# Patient Record
Sex: Female | Born: 1935 | Race: White | Hispanic: No | Smoking: Never smoker
Health system: Southern US, Community
[De-identification: ages and names within clinical notes are randomized; demographics above are authoritative.]

## PROBLEM LIST (undated history)

## (undated) DIAGNOSIS — Z9289 Personal history of other medical treatment: Secondary | ICD-10-CM

## (undated) DIAGNOSIS — M810 Age-related osteoporosis without current pathological fracture: Secondary | ICD-10-CM

## (undated) DIAGNOSIS — M8580 Other specified disorders of bone density and structure, unspecified site: Secondary | ICD-10-CM

## (undated) DIAGNOSIS — I2510 Atherosclerotic heart disease of native coronary artery without angina pectoris: Secondary | ICD-10-CM

## (undated) DIAGNOSIS — E785 Hyperlipidemia, unspecified: Secondary | ICD-10-CM

## (undated) DIAGNOSIS — I10 Essential (primary) hypertension: Secondary | ICD-10-CM

## (undated) DIAGNOSIS — N189 Chronic kidney disease, unspecified: Secondary | ICD-10-CM

## (undated) HISTORY — DX: Other specified disorders of bone density and structure, unspecified site: M85.80

## (undated) HISTORY — DX: Chronic kidney disease, unspecified: N18.9

## (undated) HISTORY — DX: Hyperlipidemia, unspecified: E78.5

## (undated) HISTORY — DX: Age-related osteoporosis without current pathological fracture: M81.0

## (undated) HISTORY — DX: Essential (primary) hypertension: I10

## (undated) HISTORY — DX: Personal history of other medical treatment: Z92.89

## (undated) HISTORY — DX: Atherosclerotic heart disease of native coronary artery without angina pectoris: I25.10

## (undated) HISTORY — PX: LIPOMA EXCISION: SHX5283

---

## 2004-09-30 DIAGNOSIS — I219 Acute myocardial infarction, unspecified: Secondary | ICD-10-CM

## 2004-09-30 HISTORY — DX: Acute myocardial infarction, unspecified: I21.9

## 2005-03-28 ENCOUNTER — Inpatient Hospital Stay (HOSPITAL_COMMUNITY): Admission: EM | Admit: 2005-03-28 | Discharge: 2005-04-01 | Payer: Self-pay | Admitting: Emergency Medicine

## 2005-05-09 ENCOUNTER — Encounter (HOSPITAL_COMMUNITY): Admission: RE | Admit: 2005-05-09 | Discharge: 2005-08-07 | Payer: Self-pay | Admitting: Interventional Cardiology

## 2005-08-08 ENCOUNTER — Encounter (HOSPITAL_COMMUNITY): Admission: RE | Admit: 2005-08-08 | Discharge: 2005-08-24 | Payer: Self-pay | Admitting: Interventional Cardiology

## 2013-02-19 ENCOUNTER — Other Ambulatory Visit: Payer: Self-pay | Admitting: Family Medicine

## 2013-02-19 DIAGNOSIS — R945 Abnormal results of liver function studies: Secondary | ICD-10-CM

## 2013-03-01 ENCOUNTER — Ambulatory Visit
Admission: RE | Admit: 2013-03-01 | Discharge: 2013-03-01 | Disposition: A | Discharge status: Home / Self Care | Payer: Medicare Other | Source: Ambulatory Visit | Attending: Family Medicine | Admitting: Family Medicine

## 2013-03-01 DIAGNOSIS — R945 Abnormal results of liver function studies: Secondary | ICD-10-CM

## 2014-04-27 ENCOUNTER — Encounter: Payer: Self-pay | Admitting: *Deleted

## 2015-12-26 ENCOUNTER — Other Ambulatory Visit (HOSPITAL_COMMUNITY): Payer: Self-pay | Admitting: Family Medicine

## 2015-12-26 DIAGNOSIS — R0609 Other forms of dyspnea: Principal | ICD-10-CM

## 2016-01-04 ENCOUNTER — Ambulatory Visit (HOSPITAL_COMMUNITY): Payer: Medicare Other | Attending: Cardiology

## 2016-01-04 ENCOUNTER — Other Ambulatory Visit: Payer: Self-pay

## 2016-01-04 DIAGNOSIS — I119 Hypertensive heart disease without heart failure: Secondary | ICD-10-CM | POA: Diagnosis not present

## 2016-01-04 DIAGNOSIS — R0609 Other forms of dyspnea: Secondary | ICD-10-CM

## 2016-01-04 DIAGNOSIS — I340 Nonrheumatic mitral (valve) insufficiency: Secondary | ICD-10-CM | POA: Diagnosis not present

## 2016-01-04 DIAGNOSIS — I358 Other nonrheumatic aortic valve disorders: Secondary | ICD-10-CM | POA: Insufficient documentation

## 2016-01-04 DIAGNOSIS — E785 Hyperlipidemia, unspecified: Secondary | ICD-10-CM | POA: Diagnosis not present

## 2016-01-04 DIAGNOSIS — I252 Old myocardial infarction: Secondary | ICD-10-CM | POA: Diagnosis not present

## 2016-02-02 ENCOUNTER — Ambulatory Visit (INDEPENDENT_AMBULATORY_CARE_PROVIDER_SITE_OTHER): Payer: Medicare Other | Admitting: Internal Medicine

## 2016-02-02 ENCOUNTER — Encounter: Payer: Self-pay | Admitting: Internal Medicine

## 2016-02-02 VITALS — BP 160/80 | HR 70 | Ht 64.0 in | Wt 130.8 lb

## 2016-02-02 DIAGNOSIS — R5383 Other fatigue: Secondary | ICD-10-CM | POA: Diagnosis not present

## 2016-02-02 DIAGNOSIS — R0600 Dyspnea, unspecified: Secondary | ICD-10-CM

## 2016-02-02 MED ORDER — AMLODIPINE BESYLATE 2.5 MG PO TABS: 2.5000 mg | ORAL_TABLET | Freq: Every day | ORAL | Status: DC

## 2016-02-02 NOTE — Patient Instructions (Signed)
Your physician has recommended you make the following change in your medication:  1. ) START AMLODIPINE 2.5 MG ONCE A DAY FOR BLOOD PRESSURE Please call in a few weeks to let us know how you are feeling; how your blood pressure is  Your physician recommends that you return for lab work in: today (BMET, CBC, TSH)

## 2016-02-02 NOTE — Progress Notes (Signed)
Cardiology Office Note   Date:  02/02/2016   ID:  Heather Saunders, DOB 06/16/1925, MRN [id number]  PCP:  Tawanna Solo, MD  Cardiologist:   Dorris Carnes, MD   Pt referred by Dr Sabra Heck for dyspnea      History of Present Illness: Heather Saunders is a 82 y.o. female with a history of Dyspnea.  Was followed in past  by Dr Tamala Julian  Hx of NSTEMI in 2006  Cath showed nonobstructive CAD  She also has a  Hx of diastolic dysfunction.and HT   Pt has had dyspnea for the past few years.  Cannot walk as far as she used to  Rare tightness  Goes away    PT gets winded with walking  One year ago walked around [place] problem   Now has to stop twice to catch breath        Outpatient Prescriptions Prior to Visit  Medication Sig Dispense Refill  . aspirin 81 MG tablet Take 81 mg by mouth daily.    Marland Kitchen atorvastatin (LIPITOR) 40 MG tablet Take 40 mg by mouth daily.    . Calcium Carbonate-Vitamin D (CALCIUM-D) 600-400 MG-UNIT TABS Take by mouth.    . cholecalciferol (VITAMIN D) 1000 UNITS tablet Take 1,000 Units by mouth daily.    Mariane Baumgarten Calcium (STOOL SOFTENER PO) Take by mouth as needed.    . metoprolol succinate (TOPROL-XL) 50 MG 24 hr tablet Take 50 mg by mouth 2 (two) times daily. Take with or immediately following a meal.    . Multiple Vitamin (MULTIVITAMIN) capsule Take 1 capsule by mouth daily.     No facility-administered medications prior to visit.     Allergies:   Boniva and Fosamax   Past Medical History  Diagnosis Date  . Hypertension   . Hyperlipidemia   . Coronary disease     NonObstructive  . Osteoporosis   . Osteopenia   . Chronic kidney disease     Past Surgical History  Procedure Laterality Date  . Lipoma excision      removed from Shoulder     Social History:  The patient  reports that she has never smoked. She does not have any smokeless tobacco history on file. She reports that she drinks about 1.2 oz of alcohol per week.  She reports that she does not use illicit drugs.   Family History:  The patient's family history includes Cancer (age of onset: 83) in her mother.    ROS:  Please see the history of present illness. All other systems are reviewed and  Negative to the above problem except as noted.    PHYSICAL EXAM: VS:  BP 160/80 mmHg  Pulse 70  Ht 5\' 4"  (1.626 m)  Wt 130 lb 12.8 oz (59.33 kg)  BMI 22.44 kg/m2  SpO2 94%  GEN: Well nourished, well developed, in no acute distress HEENT: normal Neck: no JVD, carotid bruits, or masses Cardiac: RRR; no murmurs, rubs, or gallops,no edema  Respiratory:  clear to auscultation bilaterally, normal work of breathing GI: soft, nontender, nondistended, + BS  No hepatomegaly  MS: no deformity Moving all extremities   Skin: warm and dry, no rash Neuro:  Strength and sensation are intact Psych: euthymic mood, full affect   EKG:  EKG is ordered today.  SR 66 bpm  First degree AV block  PR 206 msec     Lipid Panel No results found for: CHOL, TRIG, HDL, CHOLHDL, VLDL, LDLCALC, LDLDIRECT  Wt Readings from Last 3 Encounters:  02/02/16 130 lb 12.8 oz (59.33 kg)      ASSESSMENT AND PLAN:  1  Dyspnea  Keep on current meds  BP is up a little  May explain some of symptoms  Will plan to check CBC and TSH Add 2.5 mg amlodipine Pt will call back in 2 wks regarding her symptoms/response  If still SOB would sched Lexiscan myovue  2.  HTN  As noted above  3.  HL  Keep on statin     F?U based on test results    Signed, Dorris Carnes, MD  02/02/2016 1:49 PM    [facility] [place], [place]  [address] Phone: [phone]; Fax: [phone]

## 2016-02-03 LAB — CBC
HEMATOCRIT: 40.5 % (ref 35.0–45.0)
Hemoglobin: 13.1 g/dL (ref 11.7–15.5)
MCH: 29.2 pg (ref 27.0–33.0)
MCHC: 32.3 g/dL (ref 32.0–36.0)
MCV: 90.2 fL (ref 80.0–100.0)
MPV: 9.9 fL (ref 7.5–12.5)
PLATELETS: 242 10*3/uL (ref 140–400)
RBC: 4.49 MIL/uL (ref 3.80–5.10)
RDW: 14 % (ref 11.0–15.0)
WBC: 10.2 10*3/uL (ref 3.8–10.8)

## 2016-02-03 LAB — BASIC METABOLIC PANEL
BUN: 35 mg/dL — AB (ref 7–25)
CO2: 26 mmol/L (ref 20–31)
CREATININE: 1.53 mg/dL — AB (ref 0.60–0.88)
Calcium: 10 mg/dL (ref 8.6–10.4)
Chloride: 101 mmol/L (ref 98–110)
Glucose, Bld: 99 mg/dL (ref 65–99)
Potassium: 4.5 mmol/L (ref 3.5–5.3)
Sodium: 139 mmol/L (ref 135–146)

## 2016-02-03 LAB — TSH: TSH: 3.26 m[IU]/L

## 2016-02-07 ENCOUNTER — Encounter: Payer: Self-pay | Admitting: Interventional Cardiology

## 2016-02-16 ENCOUNTER — Telehealth: Payer: Self-pay | Admitting: Internal Medicine

## 2016-02-16 NOTE — Telephone Encounter (Signed)
Pt states she is calling to report BP readings, pt does not know her heart rate. Pt states she started amlodipine 2.5mg  02/04/16. Pt states she has had a couple episodes of short lasting lightheadedness, but denies other symptoms. Pt states she has had a tooth ache and is going to the dentist next week.  Pt advised I will forward to Dr Harrington Challenger for review.

## 2016-02-16 NOTE — Telephone Encounter (Signed)
Left msg for pt on VM BP is still high  Would try increasing amlodipine to 2.5 mg 2x per day Follow BP and symptms   Asked her to call back in 2 wks with pressures and how breathing is

## 2016-02-16 NOTE — Telephone Encounter (Signed)
Fast busy #.

## 2016-02-16 NOTE — Telephone Encounter (Signed)
New message      Pt c/o BP issue: STAT if pt c/o blurred vision, one-sided weakness or slurred speech  1. What are your last 5 BP readings? 5-9 149/70, 5-10 137/65, 5-11 144/68, 5-12 124/62, 5-13 166/75, 5-14 163/79, 5-15 168/84, 5-17 173/79, 5-18 168/80 2. Are you having any other symptoms (ex. Dizziness, headache, blurred vision, passed out)? no 3. What is your BP issue?  Calling to give updated bp readings since last ov and medication change and, pt want to know if she had an infection in her body, would that make her bp go up?

## 2016-02-19 MED ORDER — AMLODIPINE BESYLATE 2.5 MG PO TABS: 2.5000 mg | ORAL_TABLET | Freq: Two times a day (BID) | ORAL | Status: DC

## 2017-03-24 ENCOUNTER — Other Ambulatory Visit: Payer: Self-pay | Admitting: Internal Medicine

## 2017-06-25 ENCOUNTER — Telehealth: Payer: Self-pay | Admitting: Internal Medicine

## 2017-06-25 NOTE — Telephone Encounter (Signed)
Seen as new patient May, 2017 for SOB, BP was elevated.     Will send message to scheduling to get patient appointment.   Routing to Dr. Harrington Challenger to make aware.

## 2017-06-25 NOTE — Telephone Encounter (Signed)
New message   Manuela Schwartz verbalized that In 2017 Dr.Ross seen pt and she had a note that states if Dyspnea continued pt needs a stress test    it has reoccurred and she wants to know if pt will need to see Dr.Ross or if just a Stress Test needs to be ordered   if [place] office needs to write the order for stress test or to be forwarded back to Dr.Ross

## 2017-06-26 ENCOUNTER — Other Ambulatory Visit: Payer: Self-pay | Admitting: Internal Medicine

## 2017-06-26 NOTE — Telephone Encounter (Signed)
Please book pt in to see me or one of APPs for evaluation

## 2017-06-27 ENCOUNTER — Telehealth: Payer: Self-pay | Admitting: Internal Medicine

## 2017-06-27 NOTE — Telephone Encounter (Signed)
Patient has appointment with Richardson Dopp, PA-C on 06/30/17.

## 2017-06-27 NOTE — Telephone Encounter (Signed)
New message   Pt verbalized that she wants to speak to rn about the appt and why was it scheduled

## 2017-06-27 NOTE — Telephone Encounter (Signed)
Error

## 2017-06-27 NOTE — Telephone Encounter (Signed)
Explained to patient that Dr. Sabra Heck referred her back to cardiology for shortness of breath evaluation.  Pt is agreeable to appointment but needed to change to 07/02/17.   She remains SOB at times, can't walk as far without having to rest.

## 2017-06-30 ENCOUNTER — Ambulatory Visit: Payer: Medicare Other | Admitting: Physician Assistant

## 2017-07-02 ENCOUNTER — Encounter: Payer: Self-pay | Admitting: Physician Assistant

## 2017-07-02 ENCOUNTER — Ambulatory Visit (INDEPENDENT_AMBULATORY_CARE_PROVIDER_SITE_OTHER): Payer: Medicare Other | Admitting: Physician Assistant

## 2017-07-02 VITALS — BP 130/70 | HR 62 | Ht 64.0 in | Wt 133.4 lb

## 2017-07-02 DIAGNOSIS — R0602 Shortness of breath: Secondary | ICD-10-CM | POA: Diagnosis not present

## 2017-07-02 DIAGNOSIS — I10 Essential (primary) hypertension: Secondary | ICD-10-CM | POA: Diagnosis not present

## 2017-07-02 DIAGNOSIS — I2510 Atherosclerotic heart disease of native coronary artery without angina pectoris: Secondary | ICD-10-CM

## 2017-07-02 NOTE — Patient Instructions (Signed)
Medication Instructions:  1. Your physician recommends that you continue on your current medications as directed. Please refer to the Current Medication list given to you today.   Labwork: TODAY BMET, PRO BNP  Testing/Procedures: 1. Your physician has requested that you have en exercise stress myoview. For further information please visit [email or web address]. Please follow instruction sheet, as given.    Follow-Up: DR. Harrington Challenger IN 1 MONTH  Any Other Special Instructions Will Be Listed Below (If Applicable).     If you need a refill on your cardiac medications before your next appointment, please call your pharmacy.

## 2017-07-02 NOTE — Progress Notes (Signed)
Cardiology Office Note:    Date:  07/02/2017   ID:  Heather Saunders, DOB Oct 01, 1924, MRN [id number]  PCP:  Heather Lass, MD  Cardiologist:  Heather Saunders    Referring MD: Heather Lass, MD   Chief Complaint  Patient presents with  . Shortness of Breath    History of Present Illness:    Heather Saunders is a 83 y.o. female with a hx of CAD status post non-STEMI in 7628, HTN, diastolic dysfunction. Records indicate that she had a cardiac catheterization in 2006 at the time of her non-STEMI that demonstrated nonobstructive CAD. I cannot locate the cath report. She was evaluated by Dr. Harrington Saunders in 5/17 for shortness of breath. Medications were adjusted for blood pressure.  Ms. Heather Saunders returns for evaluation of shortness of breath. She has continued to have symptoms of shortness of breath with moderate activities. She still does ballroom dancing. However, she has to stop after one dance to rest. She has a nonproductive cough. 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 She denies substernal chest discomfort. However, she has an occasional twinge in her chest that does not seem to be related to exertion. She denies orthopnea, PND or edema. She denies syncope.  Prior CV studies:   The following studies were reviewed today:  Echocardiogram 01/04/16 Moderate focal basal septal hypertrophy, EF 60-65, normal wall motion, grade 1 diastolic dysfunction, calcified aortic valve, mild MR  Past Medical History:  Diagnosis Date  . Chronic kidney disease   . Coronary disease    NonObstructive  . Hyperlipidemia   . Hypertension   . Osteopenia   . Osteoporosis     Past Surgical History:  Procedure Laterality Date  . LIPOMA EXCISION     removed from Shoulder    Current Medications: Current Meds  Medication Sig  . amLODipine (NORVASC) 2.5 MG tablet TAKE 1 TABLET (2.5 MG TOTAL) BY MOUTH 2 (TWO) TIMES DAILY.  Marland Kitchen aspirin 81 MG tablet Take 81 mg by  mouth daily.  Marland Kitchen atorvastatin (LIPITOR) 40 MG tablet Take 40 mg by mouth daily.  Heather Saunders Calcium (STOOL SOFTENER PO) Take by mouth as needed.  Marland Kitchen lisinopril-hydrochlorothiazide (PRINZIDE,ZESTORETIC) 20-12.5 MG tablet Take 1 tablet by mouth 2 (two) times daily.  . metoprolol succinate (TOPROL-XL) 50 MG 24 hr tablet Take 50 mg by mouth 2 (two) times daily. Take with or immediately following a meal.  . Multiple Vitamin (MULTIVITAMIN) capsule Take 1 capsule by mouth daily.     Allergies:   Boniva [ibandronic acid] and Fosamax [alendronate sodium]   Social History   Social History  . Marital status: Divorced    Spouse name: N/A  . Number of children: N/A  . Years of education: N/A   Social History Main Topics  . Smoking status: Never Smoker  . Smokeless tobacco: Never Used  . Alcohol use 1.2 oz/week    2 Glasses of wine per week  . Drug use: No  . Sexual activity: Not Asked   Other Topics Concern  . None   Social History Narrative  . None     Family Hx: The patient's family history includes Cancer (age of onset: 89) in her mother.  ROS:   Please see the history of present illness.    Review of Systems  Cardiovascular: Positive for dyspnea on exertion.  Musculoskeletal: Positive for back pain.   All other systems reviewed and are negative.   EKGs/Labs/Other Test Reviewed:  EKG:  EKG is  ordered today.  The ekg ordered today demonstrates NSR, HR 62, normal axis, QTC 430 ms, first-degree AV block  Recent Labs: No results found for requested labs within last 8760 hours.   Recent Lipid Panel No results found for: CHOL, TRIG, HDL, CHOLHDL, LDLCALC, LDLDIRECT  Physical Exam:    VS:  BP 130/70   Pulse 62   Ht 5\' 4"  (1.626 m)   Wt 133 lb 6.4 oz (60.5 kg)   SpO2 97%   BMI 22.90 kg/m     Wt Readings from Last 3 Encounters:  07/02/17 133 lb 6.4 oz (60.5 kg)  02/02/16 130 lb 12.8 oz (59.3 kg)     Physical Exam  Constitutional: She is oriented to person, place,  and time. She appears well-developed and well-nourished. No distress.  HENT:  Head: Normocephalic and atraumatic.  Eyes: No scleral icterus.  Neck: No JVD present.  Cardiovascular: Normal rate and regular rhythm.   No murmur heard. Pulmonary/Chest: Effort normal. She has no rales.  Abdominal: Soft. There is no tenderness.  Musculoskeletal: She exhibits no edema.  Neurological: She is alert and oriented to person, place, and time.  Skin: Skin is warm and dry.  Psychiatric: She has a normal mood and affect.    ASSESSMENT:    1. Shortness of breath   2. Essential hypertension   3. Coronary artery disease involving native coronary artery of native heart without angina pectoris    PLAN:    In order of problems listed above:  1. Shortness of breath Etiology not clear.  She has some atypical chest pain.  Her ECG is unchanged.  Echo last year demonstrated normal ejection fraction and mild diastolic dysfunction.  She does not appear volume overloaded.  Since she has a hx of diastolic dysfunction, I will check a BMET, BNP.  Consider increasing the HCTZ if her BNP is high.  I will also arrange Nuclear stress test to rule out ischemia.   2. Essential hypertension The patient's blood pressure is controlled on her current regimen.  Continue current therapy.    3. CAD Remote hx of NSTEMI and non-obstructive CAD on Cardiac Catheterization.  Continue ASA, statin.  Proceed with Nuclear stress test to rule out dyspnea as an anginal equivalent.    Dispo:  Return in about 4 weeks (around 07/30/2017) for Follow up after testing, w/ Dr. Harrington Saunders, or Heather Dopp, PA-C.   Medication Adjustments/Labs and Tests Ordered: Current medicines are reviewed at length with the patient today.  Concerns regarding medicines are outlined above.  Tests Ordered: Orders Placed This Encounter  Procedures  . Pro b natriuretic peptide  . Basic metabolic panel  . Myocardial Perfusion Imaging  . EKG 12-Lead    Medication Changes: No orders of the defined types were placed in this encounter.   Signed, Heather Dopp, PA-C  07/02/2017 5:15 PM    [facility] [place] [place], [place], [place]  [address] Phone: [phone]; Fax: [phone]

## 2017-07-03 ENCOUNTER — Telehealth: Payer: Self-pay | Admitting: *Deleted

## 2017-07-03 LAB — BASIC METABOLIC PANEL
BUN / CREAT RATIO: 20 (ref 12–28)
BUN: 27 mg/dL (ref 10–36)
CHLORIDE: 100 mmol/L (ref 96–106)
CO2: 24 mmol/L (ref 20–29)
Calcium: 10.3 mg/dL (ref 8.7–10.3)
Creatinine, Ser: 1.36 mg/dL — ABNORMAL HIGH (ref 0.57–1.00)
GFR calc Af Amer: 40 mL/min/{1.73_m2} — ABNORMAL LOW (ref >59)
GFR calc non Af Amer: 34 mL/min/{1.73_m2} — ABNORMAL LOW (ref >59)
GLUCOSE: 104 mg/dL — AB (ref 65–99)
POTASSIUM: 4.7 mmol/L (ref 3.5–5.2)
Sodium: 139 mmol/L (ref 134–144)

## 2017-07-03 LAB — PRO B NATRIURETIC PEPTIDE: NT-Pro BNP: 514 pg/mL (ref 0–738)

## 2017-07-03 NOTE — Telephone Encounter (Signed)
Pt has been notified of lab results by phone with verbal understanding. Pt thanked me for my call.  

## 2017-07-03 NOTE — Telephone Encounter (Signed)
Lmtcb to go over lab results 

## 2017-07-03 NOTE — Telephone Encounter (Signed)
-----   Message from Liliane Shi, [place] sent at 07/03/2017  8:33 AM EDT ----- Please call the patient Kidney function is stable.  BNP is normal. Continue with current treatment plan. Richardson Dopp, PA-C   07/03/2017 8:33 AM

## 2017-07-03 NOTE — Telephone Encounter (Signed)
Follow Up ° ° ° °Returning call from earlier. Please call. °

## 2017-07-08 ENCOUNTER — Telehealth (HOSPITAL_COMMUNITY): Payer: Self-pay | Admitting: *Deleted

## 2017-07-08 NOTE — Telephone Encounter (Signed)
Left message on voicemail per DPR in reference to upcoming appointment scheduled on 07/11/17 at 0915 with detailed instructions given per Myocardial Perfusion Study Information Sheet for the test. LM to arrive 15 minutes early, and that it is imperative to arrive on time for appointment to keep from having the test rescheduled. If you need to cancel or reschedule your appointment, please call the office within 24 hours of your appointment. Failure to do so may result in a cancellation of your appointment, and a $50 no show fee. Phone number given for call back for any questions.

## 2017-07-11 ENCOUNTER — Ambulatory Visit (HOSPITAL_COMMUNITY): Payer: Medicare Other | Attending: Internal Medicine

## 2017-07-11 DIAGNOSIS — R0602 Shortness of breath: Secondary | ICD-10-CM | POA: Insufficient documentation

## 2017-07-11 LAB — MYOCARDIAL PERFUSION IMAGING
CHL CUP MPHR: 130 {beats}/min
CHL CUP NUCLEAR SDS: 10
CSEPHR: 90 %
CSEPPHR: 117 {beats}/min
Estimated workload: 4.6 METS
Exercise duration (min): 6 min
Exercise duration (sec): 2 s
LHR: 0.24
LVDIAVOL: 42 mL (ref 46–106)
LVSYSVOL: 2 mL
NUC STRESS TID: 0.84
RPE: 17
Rest HR: 71 {beats}/min
SRS: 13
SSS: 23

## 2017-07-11 MED ORDER — TECHNETIUM TC 99M TETROFOSMIN IV KIT
31.8000 | PACK | Freq: Once | INTRAVENOUS | Status: AC | PRN
  Administered 2017-07-11: 31.8 via INTRAVENOUS
  Filled 2017-07-11: qty 32

## 2017-07-11 MED ORDER — TECHNETIUM TC 99M TETROFOSMIN IV KIT
10.2000 | PACK | Freq: Once | INTRAVENOUS | Status: AC | PRN
  Administered 2017-07-11: 10.2 via INTRAVENOUS
  Filled 2017-07-11: qty 11

## 2017-07-12 ENCOUNTER — Encounter: Payer: Self-pay | Admitting: Physician Assistant

## 2017-07-14 ENCOUNTER — Telehealth: Payer: Self-pay | Admitting: *Deleted

## 2017-07-14 NOTE — Telephone Encounter (Signed)
Pt has been notified of Myoview results by phone with verbal understanding. Pt thanked me for my call today.

## 2017-07-14 NOTE — Telephone Encounter (Signed)
-----   Message from Liliane Shi, [place] sent at 07/12/2017  3:08 PM EDT ----- Please call the patient. Her stress test looks ok.  There is normal blood flow.  Continue current treatment plan.  Keep follow up as planned.   I will fwd to Dr. Dorris Carnes for her review as well.  Please fax a copy of this study result to her PCP:  Kathyrn Lass, MD  Thanks! Richardson Dopp, PA-C    07/12/2017 3:02 PM

## 2017-07-23 ENCOUNTER — Other Ambulatory Visit: Payer: Self-pay

## 2017-07-23 MED ORDER — AMLODIPINE BESYLATE 2.5 MG PO TABS: 2.5000 mg | ORAL_TABLET | Freq: Two times a day (BID) | ORAL | 3 refills | Status: DC

## 2017-08-01 ENCOUNTER — Encounter: Payer: Self-pay | Admitting: Physician Assistant

## 2017-08-14 ENCOUNTER — Ambulatory Visit: Payer: Medicare Other | Admitting: Physician Assistant

## 2017-09-02 NOTE — Progress Notes (Signed)
Cardiology Office Note    Date:  09/03/2017   ID:  Heather Saunders, DOB July 15, 1925, MRN [id number]  PCP:  Heather Lass, MD  Cardiologist: Dr. Harrington Saunders  Chief Complaint: 2 Months follow up  History of Present Illness:   [name] is a 83 y.o. female with a hx of CAD status post non-STEMI in 9935, HTN, diastolic dysfunction presents for follow up.   Records indicate that she had a cardiac catheterization in 2006 at the time of her non-STEMI that demonstrated nonobstructive CAD. Unable to locate cath report.   Seen by Richardson Saunders 07/02/17 for DOE and atypical chest pain. EKG reassuring. Follow up stress test is low risk. Normal BNP.   Here today for follow up. She continues to have shortness of breath with activity. She can walker 1 to 1 1/2 block at a times. She gets rest and able to walk further. She goes to dancing class 2 times/week. She is independent at home. No orthopnea, PND, syncope, LE edema or melena.       Past Medical History:  Diagnosis Date  . Chronic kidney disease   . Coronary disease    NonObstructive  . History of nuclear stress test    Nuclear stress test 10/18: EF 96, normal perfusion, low risk  . Hyperlipidemia   . Hypertension   . Osteopenia   . Osteoporosis     Past Surgical History:  Procedure Laterality Date  . LIPOMA EXCISION     removed from Shoulder    Current Medications: Prior to Admission medications   Medication Sig Start Date End Date Taking? Authorizing Provider  amLODipine (NORVASC) 2.5 MG tablet Take 1 tablet (2.5 mg total) by mouth 2 (two) times daily. 07/23/17   Belva Crome, MD  aspirin 81 MG tablet Take 81 mg by mouth daily.    [provider]  atorvastatin (LIPITOR) 40 MG tablet Take 40 mg by mouth daily.    [provider]  Docusate Calcium (STOOL SOFTENER PO) Take by mouth as needed.    [provider]  lisinopril-hydrochlorothiazide (PRINZIDE,ZESTORETIC) 20-12.5 MG tablet Take 1 tablet by  mouth 2 (two) times daily. 01/03/16   [provider]  metoprolol succinate (TOPROL-XL) 50 MG 24 hr tablet Take 50 mg by mouth 2 (two) times daily. Take with or immediately following a meal.    [provider]  Multiple Vitamin (MULTIVITAMIN) capsule Take 1 capsule by mouth daily.    [provider]    Allergies:   Boniva [ibandronic acid] and Fosamax [alendronate sodium]   Social History   Socioeconomic History  . Marital status: Divorced    Spouse name: None  . Number of children: None  . Years of education: None  . Highest education level: None  Social Needs  . Financial resource strain: None  . Food insecurity - worry: None  . Food insecurity - inability: None  . Transportation needs - medical: None  . Transportation needs - non-medical: None  Occupational History  . None  Tobacco Use  . Smoking status: Never Smoker  . Smokeless tobacco: Never Used  Substance and Sexual Activity  . Alcohol use: Yes    Alcohol/week: 1.2 oz    Types: 2 Glasses of wine per week  . Drug use: No  . Sexual activity: None  Other Topics Concern  . None  Social History Narrative  . None     Family History:  The patient's family history includes Cancer (age of onset: 35) in  her mother.   ROS:   Please see the history of present illness.    ROS All other systems reviewed and are negative.   PHYSICAL EXAM:   VS:  BP (!) 166/72   Pulse 69   Ht 5\' 4"  (1.626 m)   Wt 134 lb (60.8 kg)   SpO2 97%   BMI 23.00 kg/m    GEN: Well nourished, well developed, in no acute distress  HEENT: normal  Neck: no JVD, carotid bruits, or masses Cardiac: RRR; no murmurs, rubs, or gallops,no edema  Respiratory:  clear to auscultation bilaterally, normal work of breathing GI: soft, nontender, nondistended, + BS MS: no deformity or atrophy  Skin: warm and dry, no rash Neuro:  Alert and Oriented x 3, Strength and sensation are intact Psych: euthymic mood, full affect  Wt Readings  from Last 3 Encounters:  09/03/17 134 lb (60.8 kg)  07/02/17 133 lb 6.4 oz (60.5 kg)  02/02/16 130 lb 12.8 oz (59.3 kg)      Studies/Labs Reviewed:   EKG:  EKG is not ordered today.    Recent Labs: 07/02/2017: BUN 27; Creatinine, Ser 1.36; NT-Pro BNP 514; Potassium 4.7; Sodium 139   Lipid Panel No results found for: CHOL, TRIG, HDL, CHOLHDL, VLDL, LDLCALC, LDLDIRECT  Additional studies/ records that were reviewed today include:   Echocardiogram: 12/2015 Study Conclusions  - Left ventricle: The cavity size was normal. There was moderate   focal basal hypertrophy. Systolic function was normal. The   estimated ejection fraction was in the range of 60% to 65%. Wall   motion was normal; there were no regional wall motion   abnormalities. There was an increased relative contribution of   atrial contraction to ventricular filling. Doppler parameters are   consistent with abnormal left ventricular relaxation (grade 1   diastolic dysfunction). Doppler parameters are consistent with   high ventricular filling pressure. - Aortic valve: Moderate diffuse thickening and calcification. - Mitral valve: There was mild regurgitation.   Myoview 07/11/17  Nuclear stress EF: 96%.  Blood pressure demonstrated a normal response to exercise.  There was no ST segment deviation noted during stress.  No T wave inversion was noted during stress.  The study is normal.  This is a low risk study.   Low risk stress nuclear study with normal perfusion and normal left ventricular regional and global systolic function. EF >70%, small heart size. Limited sensitivity for ischemia.   ASSESSMENT & PLAN:    1. CAD - Remote hx of NSTEMI and non-obstructive CAD on Cardiac Catheterization. Low risk stress test 06/2017.  Continue ASA, statin and BB.   2. Dyspnea on Exertion - Stable for past few months. No ischemia on stress test. No evidence of volume overload on exam and by symptoms. Echo last year  showed diastolic dysfunction. Recent BNP normal. Unclear etiology. Continue to observe.   3. HTN - Elevated. Up titrate amlodipine as above.   4. HLD - Continue statin   Medication Adjustments/Labs and Tests Ordered: Current medicines are reviewed at length with the patient today.  Concerns regarding medicines are outlined above.  Medication changes, Labs and Tests ordered today are listed in the Patient Instructions below. Patient Instructions  Medication Instructions: Your physician has recommended you make the following change in your medication:  -1) INCREASE Amlodipine 5 mg - Take 1 Tablet (5 mg ) by mouth twice daily  Labwork: None Ordered  Procedures/Testing: None  Follow-Up: Your physician wants you to follow-up in 4-6  WEEKS with Dr. Harrington Saunders   If you need a refill on your cardiac medications before your next appointment, please call your pharmacy.      Jarrett Soho, [place]  09/03/2017 8:56 AM    [facility] [address], [place], [place]  [address] Phone: [phone]; Fax: [phone]

## 2017-09-03 ENCOUNTER — Encounter: Payer: Self-pay | Admitting: Physician Assistant

## 2017-09-03 ENCOUNTER — Ambulatory Visit: Payer: Medicare Other | Admitting: Physician Assistant

## 2017-09-03 VITALS — BP 166/72 | HR 69 | Ht 64.0 in | Wt 134.0 lb

## 2017-09-03 DIAGNOSIS — E782 Mixed hyperlipidemia: Secondary | ICD-10-CM

## 2017-09-03 DIAGNOSIS — I2510 Atherosclerotic heart disease of native coronary artery without angina pectoris: Secondary | ICD-10-CM

## 2017-09-03 DIAGNOSIS — R0609 Other forms of dyspnea: Secondary | ICD-10-CM | POA: Diagnosis not present

## 2017-09-03 DIAGNOSIS — I10 Essential (primary) hypertension: Secondary | ICD-10-CM | POA: Diagnosis not present

## 2017-09-03 MED ORDER — LISINOPRIL-HYDROCHLOROTHIAZIDE 20-25 MG PO TABS: 1.0000 | ORAL_TABLET | Freq: Every day | ORAL | 1 refills | Status: DC

## 2017-09-03 MED ORDER — AMLODIPINE BESYLATE 5 MG PO TABS: 5.0000 mg | ORAL_TABLET | Freq: Two times a day (BID) | ORAL | 1 refills | Status: DC

## 2017-09-03 MED ORDER — LISINOPRIL-HYDROCHLOROTHIAZIDE 20-12.5 MG PO TABS: 1.0000 | ORAL_TABLET | Freq: Two times a day (BID) | ORAL | 1 refills | Status: DC

## 2017-09-03 NOTE — Patient Instructions (Addendum)
Medication Instructions: Your physician has recommended you make the following change in your medication:  -1) INCREASE Amlodipine 5 mg - Take 1 Tablet (5 mg ) by mouth twice daily  Labwork: None Ordered  Procedures/Testing: None  Follow-Up: Your physician wants you to follow-up in 4-6 WEEKS with Dr. Harrington Challenger   If you need a refill on your cardiac medications before your next appointment, please call your pharmacy.

## 2017-10-20 ENCOUNTER — Encounter: Payer: Self-pay | Admitting: *Deleted

## 2017-11-03 ENCOUNTER — Ambulatory Visit: Payer: Medicare Other | Admitting: Internal Medicine

## 2017-11-03 ENCOUNTER — Encounter: Payer: Self-pay | Admitting: Internal Medicine

## 2017-11-03 VITALS — BP 138/74 | HR 65 | Ht 64.0 in | Wt 135.4 lb

## 2017-11-03 DIAGNOSIS — E782 Mixed hyperlipidemia: Secondary | ICD-10-CM | POA: Diagnosis not present

## 2017-11-03 DIAGNOSIS — I2510 Atherosclerotic heart disease of native coronary artery without angina pectoris: Secondary | ICD-10-CM

## 2017-11-03 DIAGNOSIS — R0609 Other forms of dyspnea: Secondary | ICD-10-CM

## 2017-11-03 DIAGNOSIS — I10 Essential (primary) hypertension: Secondary | ICD-10-CM | POA: Diagnosis not present

## 2017-11-03 NOTE — Patient Instructions (Signed)
Your physician recommends that you continue on your current medications as directed. Please refer to the Current Medication list given to you today.  Your physician recommends that you return for lab work (CBC, TSH, LIPID)  Your physician recommends that you schedule a follow-up appointment in: November, 2019 with Dr. Harrington Challenger.

## 2017-11-03 NOTE — Progress Notes (Signed)
Cardiology Office Note   Date:  11/03/2017   ID:  Heather Saunders, DOB 1924-12-24, MRN [id number]  PCP:  Heather Lass, MD  Cardiologist:   Heather Carnes, MD   Pt presents for f/u of dyspnea   History of Present Illness: Heather Saunders is a 84 y.o. female with a history of Dyspnea.  Was followed in past  by Heather Saunders  Hx of NSTEMI in 2006  Cath showed nonobstructive CAD  She also has a  Hx of diastolic dysfunction.and HTN   Pt has had dyspnea for the past few years.  Can't do as much as she could a year ago   I saw her for the first time last year  Adjusted her meds.   Seen by Heather Saunders and Heather Saunders in the interval   Myovue done which was normal     BP log from home 105 to 150/  Avg 120s   No dizziness   Occasional twinges in chest  NOt associated with activity Still dancing 2 to 3 x per month  Walks, though says she could walk more  Overall feeling prettyg good     Outpatient Medications Prior to Visit  Medication Sig Dispense Refill  . amLODipine (NORVASC) 5 MG tablet Take 1 tablet (5 mg total) by mouth 2 (two) times daily. 90 tablet 1  . aspirin 81 MG tablet Take 81 mg by mouth daily.    Marland Kitchen atorvastatin (LIPITOR) 40 MG tablet Take 40 mg by mouth daily.    Marland Kitchen lisinopril-hydrochlorothiazide (PRINZIDE,ZESTORETIC) 20-12.5 MG tablet Take 1 tablet by mouth 2 (two) times daily. 90 tablet 1  . metoprolol succinate (TOPROL-XL) 50 MG 24 hr tablet Take 50 mg by mouth 2 (two) times daily. Take with or immediately following a meal.    . Multiple Vitamin (MULTIVITAMIN) capsule Take 1 capsule by mouth daily.    Heather Saunders Calcium (STOOL SOFTENER PO) Take by mouth as needed.     No facility-administered medications prior to visit.      Allergies:   Heather Saunders [ibandronic acid] and Heather Saunders [alendronate sodium]   Past Medical History:  Diagnosis Date  . Chronic kidney disease   . Coronary disease    NonObstructive  . History of nuclear stress test    Nuclear stress test 10/18: EF 96, normal  perfusion, low risk  . Hyperlipidemia   . Hypertension   . Osteopenia   . Osteoporosis     Past Surgical History:  Procedure Laterality Date  . LIPOMA EXCISION     removed from Shoulder     Social History:  The patient  reports that  has never smoked. she has never used smokeless tobacco. She reports that she drinks about 1.2 oz of alcohol per week. She reports that she does not use drugs.   Family History:  The patient's family history includes Cancer (age of onset: 63) in her mother.    ROS:  Please see the history of present illness. All other systems are reviewed and  Negative to the above problem except as noted.    PHYSICAL EXAM: VS:  BP 138/74   Pulse 65   Ht 5\' 4"  (1.626 m)   Wt 135 lb 6.4 oz (61.4 kg)   SpO2 95%   BMI 23.24 kg/m   GEN: Well nourished, well developed, in no acute distress  HEENT: normal  Neck: No JVP  No carotid bruits, or masses Cardiac: RRR; no murmurs, rubs, or gallops,no edema  Respiratory:  clear to  auscultation bilaterally, normal work of breathing GI: soft, nontender, nondistended, + BS  No hepatomegaly  MS: no deformity Moving all extremities   Skin: warm and dry, no rash Neuro:  Strength and sensation are intact Psych: euthymic mood, full affect   EKG:  EKG is not ordered today.  \  Lipid Panel No results found for: CHOL, TRIG, HDL, CHOLHDL, VLDL, LDLCALC, LDLDIRECT    Wt Readings from Last 3 Encounters:  11/03/17 135 lb 6.4 oz (61.4 kg)  09/03/17 134 lb (60.8 kg)  07/02/17 133 lb 6.4 oz (60.5 kg)      ASSESSMENT AND PLAN:  1  Dyspnea  Gets SOB but also does dancing  Myovue normal  BP is OK I encouraged her to increase her walking, push HR some  Follow   I am not convinced of any active cardiac issues  Would confirm TSH and CBC   These have not been checked in awhile  2.  HTN  BP is good  Follow    3.  HL  Keep on statin  Will check Lipids   4  NSTEMI 2006   Nonobstructive CAD at that time per report    F/U next  winter.    Signed, Heather Carnes, MD  11/03/2017 10:07 AM    [address], [place], [address]  [address] Phone: [phone]; Fax: [phone]

## 2017-11-04 LAB — LIPID PANEL
CHOLESTEROL TOTAL: 194 mg/dL (ref 100–199)
Chol/HDL Ratio: 4 ratio (ref 0.0–4.4)
HDL: 49 mg/dL (ref >39)
LDL Calculated: 113 mg/dL — ABNORMAL HIGH (ref 0–99)
TRIGLYCERIDES: 161 mg/dL — AB (ref 0–149)
VLDL CHOLESTEROL CAL: 32 mg/dL (ref 5–40)

## 2017-11-04 LAB — CBC
Hematocrit: 38.9 % (ref 34.0–46.6)
Hemoglobin: 13 g/dL (ref 11.1–15.9)
MCH: 29.3 pg (ref 26.6–33.0)
MCHC: 33.4 g/dL (ref 31.5–35.7)
MCV: 88 fL (ref 79–97)
PLATELETS: 242 10*3/uL (ref 150–379)
RBC: 4.43 x10E6/uL (ref 3.77–5.28)
RDW: 14.3 % (ref 12.3–15.4)
WBC: 9.5 10*3/uL (ref 3.4–10.8)

## 2017-11-04 LAB — TSH: TSH: 4.69 u[IU]/mL — ABNORMAL HIGH (ref 0.450–4.500)

## 2017-11-05 ENCOUNTER — Other Ambulatory Visit: Payer: Self-pay | Admitting: *Deleted

## 2017-11-05 DIAGNOSIS — R7989 Other specified abnormal findings of blood chemistry: Secondary | ICD-10-CM

## 2017-11-12 ENCOUNTER — Other Ambulatory Visit: Payer: Medicare Other | Admitting: *Deleted

## 2017-11-12 DIAGNOSIS — R7989 Other specified abnormal findings of blood chemistry: Secondary | ICD-10-CM

## 2017-11-13 LAB — T3, FREE: T3 FREE: 2.5 pg/mL (ref 2.0–4.4)

## 2017-11-13 LAB — T4, FREE: FREE T4: 0.91 ng/dL (ref 0.82–1.77)

## 2017-12-06 ENCOUNTER — Other Ambulatory Visit: Payer: Self-pay | Admitting: Physician Assistant

## 2018-08-03 ENCOUNTER — Encounter: Payer: Self-pay | Admitting: Cardiology

## 2018-08-03 ENCOUNTER — Ambulatory Visit (INDEPENDENT_AMBULATORY_CARE_PROVIDER_SITE_OTHER): Payer: Medicare Other | Admitting: Cardiology

## 2018-08-03 VITALS — BP 130/66 | HR 62 | Ht 64.0 in | Wt 130.1 lb

## 2018-08-03 DIAGNOSIS — I10 Essential (primary) hypertension: Secondary | ICD-10-CM

## 2018-08-03 DIAGNOSIS — E782 Mixed hyperlipidemia: Secondary | ICD-10-CM | POA: Diagnosis not present

## 2018-08-03 DIAGNOSIS — I2510 Atherosclerotic heart disease of native coronary artery without angina pectoris: Secondary | ICD-10-CM

## 2018-08-03 DIAGNOSIS — R0609 Other forms of dyspnea: Secondary | ICD-10-CM | POA: Diagnosis not present

## 2018-08-03 NOTE — Patient Instructions (Addendum)
Medication Instructions:  Your physician recommends that you continue on your current medications as directed. Please refer to the Current Medication list given to you today.  If you need a refill on your cardiac medications before your next appointment, please call your pharmacy.   Lab work: None ordered  If you have labs (blood work) drawn today and your tests are completely normal, you will receive your results only by: Marland Kitchen MyChart Message (if you have MyChart) OR . A paper copy in the mail If you have any lab test that is abnormal or we need to change your treatment, we will call you to review the results.  Testing/Procedures: None ordered  Follow-Up: At [facility], you and your health needs are our priority.  As part of our continuing mission to provide you with exceptional heart care, we have created designated Provider Care Teams.  These Care Teams include your primary Cardiologist (physician) and Advanced Practice Providers (APPs -  Physician Assistants and Nurse Practitioners) who all work together to provide you with the care you need, when you need it. You will need a follow up appointment in:  9 months.  Please call our office 2 months in advance to schedule this appointment.  You may see Dorris Carnes, MD or one of the following Advanced Practice Providers on your designated Care Team: Richardson Dopp, PA-C [place], [place] . Daune Perch, NP   Any Other Special Instructions Will Be Listed Below (If Applicable).    DASH Eating Plan DASH stands for "Dietary Approaches to Stop Hypertension." The DASH eating plan is a healthy eating plan that has been shown to reduce high blood pressure (hypertension). It may also reduce your risk for type 2 diabetes, heart disease, and stroke. The DASH eating plan may also help with weight loss. What are tips for following this plan? General guidelines  Avoid eating more than 2,300 mg (milligrams) of salt (sodium) a day. If you have  hypertension, you may need to reduce your sodium intake to 1,500 mg a day.  Limit alcohol intake to no more than 1 drink a day for nonpregnant women and 2 drinks a day for men. One drink equals 12 oz of beer, 5 oz of wine, or 1 oz of hard liquor.  Work with your health care provider to maintain a healthy body weight or to lose weight. Ask what an ideal weight is for you.  Get at least 30 minutes of exercise that causes your heart to beat faster (aerobic exercise) most days of the week. Activities may include walking, swimming, or biking.  Work with your health care provider or diet and nutrition specialist (dietitian) to adjust your eating plan to your individual calorie needs. Reading food labels  Check food labels for the amount of sodium per serving. Choose foods with less than 5 percent of the Daily Value of sodium. Generally, foods with less than 300 mg of sodium per serving fit into this eating plan.  To find whole grains, look for the word "whole" as the first word in the ingredient list. Shopping  Buy products labeled as "low-sodium" or "no salt added."  Buy fresh foods. Avoid canned foods and premade or frozen meals. Cooking  Avoid adding salt when cooking. Use salt-free seasonings or herbs instead of table salt or sea salt. Check with your health care provider or pharmacist before using salt substitutes.  Do not fry foods. Cook foods using healthy methods such as baking, boiling, grilling, and broiling instead.  Lacinda Axon  with heart-healthy oils, such as olive, canola, soybean, or sunflower oil. Meal planning   Eat a balanced diet that includes: ? 5 or more servings of fruits and vegetables each day. At each meal, try to fill half of your plate with fruits and vegetables. ? Up to 6-8 servings of whole grains each day. ? Less than 6 oz of lean meat, poultry, or fish each day. A 3-oz serving of meat is about the same size as a deck of cards. One egg equals 1 oz. ? 2 servings of  low-fat dairy each day. ? A serving of nuts, seeds, or beans 5 times each week. ? Heart-healthy fats. Healthy fats called Omega-3 fatty acids are found in foods such as flaxseeds and coldwater fish, like sardines, salmon, and mackerel.  Limit how much you eat of the following: ? Canned or prepackaged foods. ? Food that is high in trans fat, such as fried foods. ? Food that is high in saturated fat, such as fatty meat. ? Sweets, desserts, sugary drinks, and other foods with added sugar. ? Full-fat dairy products.  Do not salt foods before eating.  Try to eat at least 2 vegetarian meals each week.  Eat more home-cooked food and less restaurant, buffet, and fast food.  When eating at a restaurant, ask that your food be prepared with less salt or no salt, if possible. What foods are recommended? The items listed may not be a complete list. Talk with your dietitian about what dietary choices are best for you. Grains Whole-grain or whole-wheat bread. Whole-grain or whole-wheat pasta. Brown rice. Modena Morrow. Bulgur. Whole-grain and low-sodium cereals. Pita bread. Low-fat, low-sodium crackers. Whole-wheat flour tortillas. Vegetables Fresh or frozen vegetables (raw, steamed, roasted, or grilled). Low-sodium or reduced-sodium tomato and vegetable juice. Low-sodium or reduced-sodium tomato sauce and tomato paste. Low-sodium or reduced-sodium canned vegetables. Fruits All fresh, dried, or frozen fruit. Canned fruit in natural juice (without added sugar). Meat and other protein foods Skinless chicken or [place]. Ground chicken or [place]. Pork with fat trimmed off. Fish and seafood. Egg whites. Dried beans, peas, or lentils. Unsalted nuts, nut butters, and seeds. Unsalted canned beans. Lean cuts of beef with fat trimmed off. Low-sodium, lean deli meat. Dairy Low-fat (1%) or fat-free (skim) milk. Fat-free, low-fat, or reduced-fat cheeses. Nonfat, low-sodium ricotta or cottage cheese. Low-fat or  nonfat yogurt. Low-fat, low-sodium cheese. Fats and oils Soft margarine without trans fats. Vegetable oil. Low-fat, reduced-fat, or light mayonnaise and salad dressings (reduced-sodium). Canola, safflower, olive, soybean, and sunflower oils. Avocado. Seasoning and other foods Herbs. Spices. Seasoning mixes without salt. Unsalted popcorn and pretzels. Fat-free sweets. What foods are not recommended? The items listed may not be a complete list. Talk with your dietitian about what dietary choices are best for you. Grains Baked goods made with fat, such as croissants, muffins, or some breads. Dry pasta or rice meal packs. Vegetables Creamed or fried vegetables. Vegetables in a cheese sauce. Regular canned vegetables (not low-sodium or reduced-sodium). Regular canned tomato sauce and paste (not low-sodium or reduced-sodium). Regular tomato and vegetable juice (not low-sodium or reduced-sodium). Angie Fava. Olives. Fruits Canned fruit in a light or heavy syrup. Fried fruit. Fruit in cream or butter sauce. Meat and other protein foods Fatty cuts of meat. Ribs. Fried meat. Berniece Salines. Sausage. Bologna and other processed lunch meats. Salami. Fatback. Hotdogs. Bratwurst. Salted nuts and seeds. Canned beans with added salt. Canned or smoked fish. Whole eggs or egg yolks. Chicken or [place] with skin. Dairy Whole  or 2% milk, cream, and half-and-half. Whole or full-fat cream cheese. Whole-fat or sweetened yogurt. Full-fat cheese. Nondairy creamers. Whipped toppings. Processed cheese and cheese spreads. Fats and oils Butter. Stick margarine. Lard. Shortening. Ghee. Bacon fat. Tropical oils, such as coconut, palm kernel, or palm oil. Seasoning and other foods Salted popcorn and pretzels. Onion salt, garlic salt, seasoned salt, table salt, and sea salt. Worcestershire sauce. Tartar sauce. Barbecue sauce. Teriyaki sauce. Soy sauce, including reduced-sodium. Steak sauce. Canned and packaged gravies. Fish sauce. Oyster  sauce. Cocktail sauce. Horseradish that you find on the shelf. Ketchup. Mustard. Meat flavorings and tenderizers. Bouillon cubes. Hot sauce and Tabasco sauce. Premade or packaged marinades. Premade or packaged taco seasonings. Relishes. Regular salad dressings. Where to find more information:  National Heart, Lung, and [place]: https://wilson-eaton.com/  American Heart Association: www.heart.org Summary  The DASH eating plan is a healthy eating plan that has been shown to reduce high blood pressure (hypertension). It may also reduce your risk for type 2 diabetes, heart disease, and stroke.  With the DASH eating plan, you should limit salt (sodium) intake to 2,300 mg a day. If you have hypertension, you may need to reduce your sodium intake to 1,500 mg a day.  When on the DASH eating plan, aim to eat more fresh fruits and vegetables, whole grains, lean proteins, low-fat dairy, and heart-healthy fats.  Work with your health care provider or diet and nutrition specialist (dietitian) to adjust your eating plan to your individual calorie needs. This information is not intended to replace advice given to you by your health care provider. Make sure you discuss any questions you have with your health care provider. Document Released: 09/05/2011 Document Revised: 09/09/2016 Document Reviewed: 09/09/2016 Elsevier Interactive Patient Education  [employer].

## 2018-08-03 NOTE — Progress Notes (Signed)
Cardiology Office Note:    Date:  08/03/2018   ID:  [name], DOB 03-11-1925, MRN [id number]  PCP:  Kathyrn Lass, MD  Cardiologist:  Dorris Carnes, MD  Referring MD: Kathyrn Lass, MD   Chief Complaint  Patient presents with  . Follow-up    CAD    History of Present Illness:    Heather Saunders is a 84 y.o. female with a past medical history significant for CAD S/P NSTEMI in 2006 with non-obstructive CAD on cath, hypertension and diastolic dysfunction.  She has had dyspnea for the past few years with reduced activity tolerance.  She had a normal stress test.   Heather Saunders is here today alone. She reports that she gives out quicker than she used to but is stable, no worse recently. She does not exercise regularly but tries to walk some. She still does ballroom dancing about 2-3 times per month. She is having joint pains in her hands and numbness and tingling. She has been evaluated by her PCP.   No chest pain/pressure, palpitations, lightheadedness or dizziness.   She lives alone and is fully independent except for occ help with housework. She has no children but has neices and nephews and many friends.     Past Medical History:  Diagnosis Date  . Chronic kidney disease   . Coronary disease    NonObstructive  . History of nuclear stress test    Nuclear stress test 10/18: EF 96, normal perfusion, low risk  . Hyperlipidemia   . Hypertension   . Osteopenia   . Osteoporosis     Past Surgical History:  Procedure Laterality Date  . LIPOMA EXCISION     removed from Shoulder    Current Medications: Current Meds  Medication Sig  . amLODipine (NORVASC) 5 MG tablet TAKE 1 TABLET BY MOUTH TWICE A DAY  . aspirin 81 MG tablet Take 81 mg by mouth daily.  Marland Kitchen atorvastatin (LIPITOR) 40 MG tablet Take 40 mg by mouth daily.  Marland Kitchen lisinopril-hydrochlorothiazide (PRINZIDE,ZESTORETIC) 20-25 MG tablet Take 1 tablet by mouth daily.  . metoprolol succinate (TOPROL-XL) 50 MG 24 hr tablet  Take 50 mg by mouth 2 (two) times daily. Take with or immediately following a meal.  . Multiple Vitamin (MULTIVITAMIN) capsule Take 1 capsule by mouth daily.     Allergies:   Boniva [ibandronic acid] and Fosamax [alendronate sodium]   Social History   Socioeconomic History  . Marital status: Divorced    Spouse name: Not on file  . Number of children: Not on file  . Years of education: Not on file  . Highest education level: Not on file  Occupational History  . Not on file  Social Needs  . Financial resource strain: Not on file  . Food insecurity:    Worry: Not on file    Inability: Not on file  . Transportation needs:    Medical: Not on file    Non-medical: Not on file  Tobacco Use  . Smoking status: Never Smoker  . Smokeless tobacco: Never Used  Substance and Sexual Activity  . Alcohol use: Yes    Alcohol/week: 2.0 standard drinks    Types: 2 Glasses of wine per week  . Drug use: No  . Sexual activity: Not on file  Lifestyle  . Physical activity:    Days per week: Not on file    Minutes per session: Not on file  . Stress: Not on file  Relationships  . Social connections:  Talks on phone: Not on file    Gets together: Not on file    Attends religious service: Not on file    Active member of club or organization: Not on file    Attends meetings of clubs or organizations: Not on file    Relationship status: Not on file  Other Topics Concern  . Not on file  Social History Narrative  . Not on file     Family History: The patient's family history includes Cancer (age of onset: 44) in her mother. ROS:   Please see the history of present illness.     All other systems reviewed and are negative.  EKGs/Labs/Other Studies Reviewed:    The following studies were reviewed today:  Nuclear stress test 07/11/2017 Study Highlights    Nuclear stress EF: 96%.  Blood pressure demonstrated a normal response to exercise.  There was no ST segment deviation noted  during stress.  No T wave inversion was noted during stress.  The study is normal.  This is a low risk study. Low risk stress nuclear study with normal perfusion and normal left ventricular regional and global systolic function. EF >70%, small heart size. Limited sensitivity for ischemia.    Echocardiogram 01/04/16 Moderate focal basal septal hypertrophy, EF 60-65, normal wall motion, grade 1 diastolic dysfunction, calcified aortic valve, mild MR  EKG:  EKG is ordered today.  The ekg ordered today demonstrates NSR with no abnormalities  Recent Labs: 11/03/2017: Hemoglobin 13.0; Platelets 242; TSH 4.690   Recent Lipid Panel    Component Value Date/Time   CHOL 194 11/03/2017 1046   TRIG 161 (H) 11/03/2017 1046   HDL 49 11/03/2017 1046   CHOLHDL 4.0 11/03/2017 1046   LDLCALC 113 (H) 11/03/2017 1046    Physical Exam:    VS:  BP 130/66   Pulse 62   Ht 5\' 4"  (1.626 m)   Wt 130 lb 1.9 oz (59 kg)   BMI 22.34 kg/m     Wt Readings from Last 3 Encounters:  08/03/18 130 lb 1.9 oz (59 kg)  11/03/17 135 lb 6.4 oz (61.4 kg)  09/03/17 134 lb (60.8 kg)     Physical Exam  Constitutional: She is oriented to person, place, and time. She appears well-developed and well-nourished.  Elderly female looks younger than her age  HENT:  Head: Normocephalic and atraumatic.  Neck: Normal range of motion. Neck supple. No JVD present.  Cardiovascular: Normal rate, regular rhythm, normal heart sounds and intact distal pulses. Exam reveals no gallop and no friction rub.  No murmur heard. Pulmonary/Chest: Effort normal and breath sounds normal. No respiratory distress. She has no wheezes. She has no rales.  Abdominal: Soft. Bowel sounds are normal.  Musculoskeletal: Normal range of motion. She exhibits no edema or deformity.  Neurological: She is alert and oriented to person, place, and time.  Skin: Skin is warm and dry.  Psychiatric: She has a normal mood and affect. Her behavior is normal.  Judgment and thought content normal.  Vitals reviewed.    ASSESSMENT:    1. Dyspnea on exertion   2. Essential hypertension   3. Coronary artery disease involving native coronary artery of native heart without angina pectoris   4. Mixed hyperlipidemia    PLAN:    In order of problems listed above:  Dyspnea: this has been going on for several years. Had normal stress test. She feels that she is not as active as she used to be and has not  been doing any regular exercise. She will try to get [facility] to help improve her endurance.   Hypertension: BP well controlled. Continue current medications.   Hyperlipidemia: On atorvastatin 40 mg daily.  Lipid panel 11/03/2017 showed an LDL of 113 which we would like to see lower.  The patient was offered Zetia but wanted to work on diet instead. She still declines any additional medical therapy. She will work on diet and exercise.   CAD: History of non-STEMI in 2006 with nonobstructive CAD on cath.  No chest discomfort. Continue aspirin and statin.   Medication Adjustments/Labs and Tests Ordered: Current medicines are reviewed at length with the patient today.  Concerns regarding medicines are outlined above. Labs and tests ordered and medication changes are outlined in the patient instructions below:  Patient Instructions  Medication Instructions:  Your physician recommends that you continue on your current medications as directed. Please refer to the Current Medication list given to you today.  If you need a refill on your cardiac medications before your next appointment, please call your pharmacy.   Lab work: None ordered  If you have labs (blood work) drawn today and your tests are completely normal, you will receive your results only by: Marland Kitchen MyChart Message (if you have MyChart) OR . A paper copy in the mail If you have any lab test that is abnormal or we need to change your treatment, we will call you  to review the results.  Testing/Procedures: None ordered  Follow-Up: At [facility], you and your health needs are our priority.  As part of our continuing mission to provide you with exceptional heart care, we have created designated Provider Care Teams.  These Care Teams include your primary Cardiologist (physician) and Advanced Practice Providers (APPs -  Physician Assistants and Nurse Practitioners) who all work together to provide you with the care you need, when you need it. You will need a follow up appointment in:  9 months.  Please call our office 2 months in advance to schedule this appointment.  You may see Dorris Carnes, MD or one of the following Advanced Practice Providers on your designated Care Team: Richardson Dopp, PA-C [place], [place] . Daune Perch, NP   Any Other Special Instructions Will Be Listed Below (If Applicable).    DASH Eating Plan DASH stands for "Dietary Approaches to Stop Hypertension." The DASH eating plan is a healthy eating plan that has been shown to reduce high blood pressure (hypertension). It may also reduce your risk for type 2 diabetes, heart disease, and stroke. The DASH eating plan may also help with weight loss. What are tips for following this plan? General guidelines  Avoid eating more than 2,300 mg (milligrams) of salt (sodium) a day. If you have hypertension, you may need to reduce your sodium intake to 1,500 mg a day.  Limit alcohol intake to no more than 1 drink a day for nonpregnant women and 2 drinks a day for men. One drink equals 12 oz of beer, 5 oz of wine, or 1 oz of hard liquor.  Work with your health care provider to maintain a healthy body weight or to lose weight. Ask what an ideal weight is for you.  Get at least 30 minutes of exercise that causes your heart to beat faster (aerobic exercise) most days of the week. Activities may include walking, swimming, or biking.  Work with your health care provider or diet and nutrition  specialist (dietitian) to adjust your eating plan to your individual calorie needs. Reading food labels  Check food labels for the amount of sodium per serving. Choose foods with less than 5 percent of the Daily Value of sodium. Generally, foods with less than 300 mg of sodium per serving fit into this eating plan.  To find whole grains, look for the word "whole" as the first word in the ingredient list. Shopping  Buy products labeled as "low-sodium" or "no salt added."  Buy fresh foods. Avoid canned foods and premade or frozen meals. Cooking  Avoid adding salt when cooking. Use salt-free seasonings or herbs instead of table salt or sea salt. Check with your health care provider or pharmacist before using salt substitutes.  Do not fry foods. Cook foods using healthy methods such as baking, boiling, grilling, and broiling instead.  Cook with heart-healthy oils, such as olive, canola, soybean, or sunflower oil. Meal planning   Eat a balanced diet that includes: ? 5 or more servings of fruits and vegetables each day. At each meal, try to fill half of your plate with fruits and vegetables. ? Up to 6-8 servings of whole grains each day. ? Less than 6 oz of lean meat, poultry, or fish each day. A 3-oz serving of meat is about the same size as a deck of cards. One egg equals 1 oz. ? 2 servings of low-fat dairy each day. ? A serving of nuts, seeds, or beans 5 times each week. ? Heart-healthy fats. Healthy fats called Omega-3 fatty acids are found in foods such as flaxseeds and coldwater fish, like sardines, salmon, and mackerel.  Limit how much you eat of the following: ? Canned or prepackaged foods. ? Food that is high in trans fat, such as fried foods. ? Food that is high in saturated fat, such as fatty meat. ? Sweets, desserts, sugary drinks, and other foods with added sugar. ? Full-fat dairy products.  Do not salt foods before eating.  Try to eat at least 2 vegetarian meals each  week.  Eat more home-cooked food and less restaurant, buffet, and fast food.  When eating at a restaurant, ask that your food be prepared with less salt or no salt, if possible. What foods are recommended? The items listed may not be a complete list. Talk with your dietitian about what dietary choices are best for you. Grains Whole-grain or whole-wheat bread. Whole-grain or whole-wheat pasta. Brown rice. [place]. Bulgur. Whole-grain and low-sodium cereals. Pita bread. Low-fat, low-sodium crackers. Whole-wheat flour tortillas. Vegetables Fresh or frozen vegetables (raw, steamed, roasted, or grilled). Low-sodium or reduced-sodium tomato and vegetable juice. Low-sodium or reduced-sodium tomato sauce and tomato paste. Low-sodium or reduced-sodium canned vegetables. Fruits All fresh, dried, or frozen fruit. Canned fruit in natural juice (without added sugar). Meat and other protein foods Skinless chicken or [place]. Ground chicken or [place]. Pork with fat trimmed off. Fish and seafood. Egg whites. Dried beans, peas, or lentils. Unsalted nuts, nut butters, and seeds. Unsalted canned beans. Lean cuts of beef with fat trimmed off. Low-sodium, lean deli meat. Dairy Low-fat (1%) or fat-free (skim) milk. Fat-free, low-fat, or reduced-fat cheeses. Nonfat, low-sodium ricotta or cottage cheese. Low-fat or nonfat yogurt. Low-fat, low-sodium cheese. Fats and oils Soft margarine without trans fats. Vegetable oil. Low-fat, reduced-fat, or light mayonnaise and salad dressings (reduced-sodium). Canola, safflower, olive, soybean, and sunflower oils. Avocado. Seasoning and other foods Herbs. Spices. Seasoning mixes without salt. Unsalted popcorn and pretzels. Fat-free sweets. What foods are  not recommended? The items listed may not be a complete list. Talk with your dietitian about what dietary choices are best for you. Grains Baked goods made with fat, such as croissants, muffins, or some breads. Dry pasta  or rice meal packs. Vegetables Creamed or fried vegetables. Vegetables in a cheese sauce. Regular canned vegetables (not low-sodium or reduced-sodium). Regular canned tomato sauce and paste (not low-sodium or reduced-sodium). Regular tomato and vegetable juice (not low-sodium or reduced-sodium). Angie Fava. Olives. Fruits Canned fruit in a light or heavy syrup. Fried fruit. Fruit in cream or butter sauce. Meat and other protein foods Fatty cuts of meat. Ribs. Fried meat. Berniece Salines. Sausage. Bologna and other processed lunch meats. Salami. Fatback. Hotdogs. Bratwurst. Salted nuts and seeds. Canned beans with added salt. Canned or smoked fish. Whole eggs or egg yolks. Chicken or [place] with skin. Dairy Whole or 2% milk, cream, and half-and-half. Whole or full-fat cream cheese. Whole-fat or sweetened yogurt. Full-fat cheese. Nondairy creamers. Whipped toppings. Processed cheese and cheese spreads. Fats and oils Butter. Stick margarine. Lard. Shortening. Ghee. Bacon fat. Tropical oils, such as coconut, palm kernel, or palm oil. Seasoning and other foods Salted popcorn and pretzels. Onion salt, garlic salt, seasoned salt, table salt, and sea salt. Worcestershire sauce. Tartar sauce. Barbecue sauce. Teriyaki sauce. Soy sauce, including reduced-sodium. Steak sauce. Canned and packaged gravies. Fish sauce. Oyster sauce. Cocktail sauce. Horseradish that you find on the shelf. Ketchup. Mustard. Meat flavorings and tenderizers. Bouillon cubes. Hot sauce and Tabasco sauce. Premade or packaged marinades. Premade or packaged taco seasonings. Relishes. Regular salad dressings. Where to find more information:  National Heart, Lung, and [place]: https://wilson-eaton.com/  American Heart Association: www.heart.org Summary  The DASH eating plan is a healthy eating plan that has been shown to reduce high blood pressure (hypertension). It may also reduce your risk for type 2 diabetes, heart disease, and stroke.  With the  DASH eating plan, you should limit salt (sodium) intake to 2,300 mg a day. If you have hypertension, you may need to reduce your sodium intake to 1,500 mg a day.  When on the DASH eating plan, aim to eat more fresh fruits and vegetables, whole grains, lean proteins, low-fat dairy, and heart-healthy fats.  Work with your health care provider or diet and nutrition specialist (dietitian) to adjust your eating plan to your individual calorie needs. This information is not intended to replace advice given to you by your health care provider. Make sure you discuss any questions you have with your health care provider. Document Released: 09/05/2011 Document Revised: 09/09/2016 Document Reviewed: 09/09/2016 Elsevier Interactive Patient Education  2018 [place], Daune Perch, NP  08/03/2018 6:19 PM    [facility]

## 2018-12-07 ENCOUNTER — Other Ambulatory Visit: Payer: Self-pay | Admitting: Physician Assistant

## 2019-04-23 ENCOUNTER — Telehealth: Payer: Self-pay

## 2019-04-23 NOTE — Telephone Encounter (Signed)

## 2019-04-26 ENCOUNTER — Ambulatory Visit (INDEPENDENT_AMBULATORY_CARE_PROVIDER_SITE_OTHER): Payer: Medicare Other | Admitting: Physician Assistant

## 2019-04-26 ENCOUNTER — Encounter: Payer: Self-pay | Admitting: Physician Assistant

## 2019-04-26 ENCOUNTER — Other Ambulatory Visit: Payer: Self-pay

## 2019-04-26 VITALS — BP 114/58 | HR 69 | Ht 64.0 in | Wt 128.8 lb

## 2019-04-26 DIAGNOSIS — I10 Essential (primary) hypertension: Secondary | ICD-10-CM | POA: Diagnosis not present

## 2019-04-26 DIAGNOSIS — I2510 Atherosclerotic heart disease of native coronary artery without angina pectoris: Secondary | ICD-10-CM | POA: Diagnosis not present

## 2019-04-26 DIAGNOSIS — R0602 Shortness of breath: Secondary | ICD-10-CM | POA: Diagnosis not present

## 2019-04-26 NOTE — Patient Instructions (Signed)
Medication Instructions:  Your physician recommends that you continue on your current medications as directed. Please refer to the Current Medication list given to you today.  If you need a refill on your cardiac medications before your next appointment, please call your pharmacy.   Lab work: None ordered  If you have labs (blood work) drawn today and your tests are completely normal, you will receive your results only by: . MyChart Message (if you have MyChart) OR . A paper copy in the mail If you have any lab test that is abnormal or we need to change your treatment, we will call you to review the results.  Testing/Procedures: None ordered   Follow-Up: At [facility], you and your health needs are our priority.  As part of our continuing mission to provide you with exceptional heart care, we have created designated Provider Care Teams.  These Care Teams include your primary Cardiologist (physician) and Advanced Practice Providers (APPs -  Physician Assistants and Nurse Practitioners) who all work together to provide you with the care you need, when you need it. You will need a follow up appointment in:  6 months.  Please call our office 2 months in advance to schedule this appointment.  You may see Paula Ross, MD or one of the following Advanced Practice Providers on your designated Care Team: Scott Weaver, PA-C Vin Bhagat, PA-C . Janine Hammond, NP  Any Other Special Instructions Will Be Listed Below (If Applicable).    

## 2019-04-26 NOTE — Progress Notes (Signed)
Cardiology Office Note    Date:  04/26/2019   ID:  Heather Saunders, DOB 05/20/1925, MRN [id number]  PCP:  Kathyrn Lass, MD  Cardiologist: Dr. Harrington Challenger  Chief Complaint: 6 months follow up   History of Present Illness:   Heather Saunders is a 85 y.o. female CAD status post non-STEMI in 9563, HTN, diastolic dysfunction presents for follow up.   Records indicate that she had a cardiac catheterizationin2006 at the time of her non-STEMI that demonstrated nonobstructive CAD. Unable to locate cath report.   Low risk stress test October 2018.   Here today for follow-up.  Patient has chronic stable dyspnea with extreme exertion.  No change in past 6 months.  She was doing dancing classes before pandemic.  She is missing that.  She denies chest pain, palpitation, orthopnea, PND, syncope, lower extremity edema or melena.  Compliant with her medication.   Past Medical History:  Diagnosis Date  . Chronic kidney disease   . Coronary disease    NonObstructive  . History of nuclear stress test    Nuclear stress test 10/18: EF 96, normal perfusion, low risk  . Hyperlipidemia   . Hypertension   . Osteopenia   . Osteoporosis     Past Surgical History:  Procedure Laterality Date  . LIPOMA EXCISION     removed from Shoulder    Current Medications: Prior to Admission medications   Medication Sig Start Date End Date Taking? Authorizing Provider  amLODipine (NORVASC) 5 MG tablet TAKE 1 TABLET BY MOUTH TWICE A DAY 12/07/18  Yes Jarvis Knodel, PA  aspirin 81 MG tablet Take 81 mg by mouth daily.   Yes [provider]  atorvastatin (LIPITOR) 40 MG tablet Take 40 mg by mouth daily.   Yes [provider]  lisinopril-hydrochlorothiazide (PRINZIDE,ZESTORETIC) 20-25 MG tablet Take 1 tablet by mouth daily.   Yes [provider]  metoprolol succinate (TOPROL-XL) 50 MG 24 hr tablet Take 50 mg by mouth 2 (two) times daily. Take with or immediately following a meal.   Yes  [provider]  Multiple Vitamin (MULTIVITAMIN) capsule Take 1 capsule by mouth daily.   Yes [provider]    Allergies:   Boniva [ibandronic acid] and Fosamax [alendronate sodium]   Social History   Socioeconomic History  . Marital status: Divorced    Spouse name: Not on file  . Number of children: Not on file  . Years of education: Not on file  . Highest education level: Not on file  Occupational History  . Not on file  Social Needs  . Financial resource strain: Not on file  . Food insecurity    Worry: Not on file    Inability: Not on file  . Transportation needs    Medical: Not on file    Non-medical: Not on file  Tobacco Use  . Smoking status: Never Smoker  . Smokeless tobacco: Never Used  Substance and Sexual Activity  . Alcohol use: Yes    Alcohol/week: 2.0 standard drinks    Types: 2 Glasses of wine per week  . Drug use: No  . Sexual activity: Not on file  Lifestyle  . Physical activity    Days per week: Not on file    Minutes per session: Not on file  . Stress: Not on file  Relationships  . Social Herbalist on phone: Not on file    Gets together: Not on file    Attends religious service: Not  on file    Active member of club or organization: Not on file    Attends meetings of clubs or organizations: Not on file    Relationship status: Not on file  Other Topics Concern  . Not on file  Social History Narrative  . Not on file     Family History:  The patient's family history includes Cancer (age of onset: 38) in her mother.   ROS:   Please see the history of present illness.    ROS All other systems reviewed and are negative.   PHYSICAL EXAM:   VS:  BP (!) 114/58   Pulse 69   Ht 5\' 4"  (1.626 m)   Wt 128 lb 12.8 oz (58.4 kg)   SpO2 97%   BMI 22.11 kg/m    GEN: Well nourished, well developed, in no acute distress  HEENT: normal  Neck: no JVD, carotid bruits, or masses Cardiac: RRR; no murmurs, rubs, or gallops,no  edema  Respiratory:  clear to auscultation bilaterally, normal work of breathing GI: soft, nontender, nondistended, + BS MS: no deformity or atrophy  Skin: warm and dry, no rash Neuro:  Alert and Oriented x 3, Strength and sensation are intact Psych: euthymic mood, full affect  Wt Readings from Last 3 Encounters:  04/26/19 128 lb 12.8 oz (58.4 kg)  08/03/18 130 lb 1.9 oz (59 kg)  11/03/17 135 lb 6.4 oz (61.4 kg)      Studies/Labs Reviewed:   EKG:  EKG is not ordered today.    Recent Labs: No results found for requested labs within last 8760 hours.   Lipid Panel    Component Value Date/Time   CHOL 194 11/03/2017 1046   TRIG 161 (H) 11/03/2017 1046   HDL 49 11/03/2017 1046   CHOLHDL 4.0 11/03/2017 1046   LDLCALC 113 (H) 11/03/2017 1046    Additional studies/ records that were reviewed today include:   Stress test 06/2017  Nuclear stress EF: 96%.  Blood pressure demonstrated a normal response to exercise.  There was no ST segment deviation noted during stress.  No T wave inversion was noted during stress.  The study is normal.  This is a low risk study.   Low risk stress nuclear study with normal perfusion and normal left ventricular regional and global systolic function. EF >70%, small heart size. Limited sensitivity for ischemia.  Echocardiogram: 12/2015  - Left ventricle: The cavity size was normal. There was moderate   focal basal hypertrophy. Systolic function was normal. The   estimated ejection fraction was in the range of 60% to 65%. Wall   motion was normal; there were no regional wall motion   abnormalities. There was an increased relative contribution of   atrial contraction to ventricular filling. Doppler parameters are   consistent with abnormal left ventricular relaxation (grade 1   diastolic dysfunction). Doppler parameters are consistent with   high ventricular filling pressure. - Aortic valve: Moderate diffuse thickening and calcification.  - Mitral valve: There was mild regurgitation.      ASSESSMENT & PLAN:    1. Chronic dyspnea on exertion  Stable for months.  No associated chest discomfort.  Likely due to deconditioning.  She was active before pandemic.  Encouraged activity as tolerated.  She lives by self.  2.  Hypertension -Blood pressure stable on current medication.   Medication Adjustments/Labs and Tests Ordered: Current medicines are reviewed at length with the patient today.  Concerns regarding medicines are outlined above.  Medication changes,  Labs and Tests ordered today are listed in the Patient Instructions below. Patient Instructions  Medication Instructions:  Your physician recommends that you continue on your current medications as directed. Please refer to the Current Medication list given to you today.  If you need a refill on your cardiac medications before your next appointment, please call your pharmacy.   Lab work: None ordered  If you have labs (blood work) drawn today and your tests are completely normal, you will receive your results only by: Marland Kitchen MyChart Message (if you have MyChart) OR . A paper copy in the mail If you have any lab test that is abnormal or we need to change your treatment, we will call you to review the results.  Testing/Procedures: None ordered  Follow-Up: At [facility], you and your health needs are our priority.  As part of our continuing mission to provide you with exceptional heart care, we have created designated Provider Care Teams.  These Care Teams include your primary Cardiologist (physician) and Advanced Practice Providers (APPs -  Physician Assistants and Nurse Practitioners) who all work together to provide you with the care you need, when you need it. You will need a follow up appointment in:  6 months.  Please call our office 2 months in advance to schedule this appointment.  You may see Dorris Carnes, MD or one of the following Advanced Practice Providers  on your designated Care Team: Richardson Dopp, PA-C [place], [place] . Daune Perch, NP  Any Other Special Instructions Will Be Listed Below (If Applicable).       Jarrett Soho, [place]  04/26/2019 11:36 AM    [facility] [place], [place], [place]  [address] Phone: [phone]; Fax: [phone]

## 2019-08-10 ENCOUNTER — Telehealth: Payer: Self-pay | Admitting: *Deleted

## 2019-08-10 DIAGNOSIS — I2510 Atherosclerotic heart disease of native coronary artery without angina pectoris: Secondary | ICD-10-CM

## 2019-08-10 DIAGNOSIS — E782 Mixed hyperlipidemia: Secondary | ICD-10-CM

## 2019-08-10 DIAGNOSIS — I10 Essential (primary) hypertension: Secondary | ICD-10-CM

## 2019-08-10 MED ORDER — EZETIMIBE 10 MG PO TABS: 10.0000 mg | ORAL_TABLET | Freq: Every day | ORAL | 3 refills | Status: DC

## 2019-08-10 MED ORDER — LISINOPRIL-HYDROCHLOROTHIAZIDE 10-12.5 MG PO TABS: 1.0000 | ORAL_TABLET | Freq: Every day | ORAL | 3 refills | Status: DC

## 2019-08-10 NOTE — Telephone Encounter (Addendum)
-----   Message from Fay Records, MD sent at 08/06/2019 10:51 AM EST ----- Reviewed labs from primary MD Dr Sabra Heck Cr is up a little     I would recomm cutting lisinopril to 10/12.5 (cut in 1/2)    Follow BP at home    Come in for labs in 14 days for BMET    Get BP check at that time _________________________________________________________________ Notes recorded by Fay Records, MD on 08/05/2019 at 4:29 PM EST  LDL should be lower given that she has CAD  I would recomm adding Zetia to regimen   F/U lipids in 8 wks with AST

## 2019-08-10 NOTE — Telephone Encounter (Signed)
Spoke with patient and reviewed recommendations from Dr. Harrington Challenger. She verbalizes understanding of all. Will add zetia and pick up new dose lower dose of lisinopril/hctz and will come in on 11/24 for BMET and BP check. Scheduled follow up for 10/21/18 with Dr. Harrington Challenger and can have lipids/ast checked that day.  Pt will also try to keep a check on BP at home.

## 2019-08-24 ENCOUNTER — Ambulatory Visit: Payer: Medicare Other | Admitting: *Deleted

## 2019-08-24 ENCOUNTER — Other Ambulatory Visit: Payer: Self-pay

## 2019-08-24 ENCOUNTER — Encounter (INDEPENDENT_AMBULATORY_CARE_PROVIDER_SITE_OTHER): Payer: Self-pay

## 2019-08-24 ENCOUNTER — Other Ambulatory Visit: Payer: Medicare Other

## 2019-08-24 VITALS — BP 126/62 | HR 76 | Resp 18 | Wt 131.2 lb

## 2019-08-24 DIAGNOSIS — Z0130 Encounter for examination of blood pressure without abnormal findings: Secondary | ICD-10-CM

## 2019-08-24 DIAGNOSIS — E782 Mixed hyperlipidemia: Secondary | ICD-10-CM

## 2019-08-24 DIAGNOSIS — I2510 Atherosclerotic heart disease of native coronary artery without angina pectoris: Secondary | ICD-10-CM

## 2019-08-24 DIAGNOSIS — I10 Essential (primary) hypertension: Secondary | ICD-10-CM

## 2019-08-24 NOTE — Progress Notes (Signed)
1.) Reason for visit: BP check  2.) Name of MD requesting visit: Dr. Harrington Challenger   3.) H&P: lisinopril/hctz decreased after recent labs showed creatinine elevated.    4.) ROS related to problem: pt taking lisinopril/hctz 10/12.5 mg daily in addition to all other medications.  At home last BP check was 139/70.  Today first BP is 154/70.  On recheck: 126/62126 62  5.) Assessment and plan per MD: to Dr. Harrington Challenger for further review/recommendations after seeing today's repeat blood work.

## 2019-08-24 NOTE — Patient Instructions (Signed)
Medication Instructions:  No changes today *If you need a refill on your cardiac medications before your next appointment, please call your pharmacy*  Lab Work: Get blood work today before you leave. If you have labs (blood work) drawn today and your tests are completely normal, you will receive your results only by: Marland Kitchen MyChart Message (if you have MyChart) OR . A paper copy in the mail If you have any lab test that is abnormal or we need to change your treatment, we will call you to review the results.  Testing/Procedures: none  Follow-Up: as planned. We will contact you with your results of the blood work.

## 2019-08-24 NOTE — Progress Notes (Signed)
BP looks good   Will review labs

## 2019-08-25 LAB — BASIC METABOLIC PANEL
BUN/Creatinine Ratio: 21 (ref 12–28)
BUN: 34 mg/dL (ref 10–36)
CO2: 24 mmol/L (ref 20–29)
Calcium: 9.9 mg/dL (ref 8.7–10.3)
Chloride: 105 mmol/L (ref 96–106)
Creatinine, Ser: 1.64 mg/dL — ABNORMAL HIGH (ref 0.57–1.00)
GFR calc Af Amer: 31 mL/min/{1.73_m2} — ABNORMAL LOW (ref >59)
GFR calc non Af Amer: 27 mL/min/{1.73_m2} — ABNORMAL LOW (ref >59)
Glucose: 117 mg/dL — ABNORMAL HIGH (ref 65–99)
Potassium: 5.1 mmol/L (ref 3.5–5.2)
Sodium: 143 mmol/L (ref 134–144)

## 2019-08-31 ENCOUNTER — Telehealth: Payer: Self-pay

## 2019-08-31 NOTE — Telephone Encounter (Signed)
Pt states she has not had any issues urinating and feels fine. She agrees to increase her fluid intake. Pt is aware to continue monitoring BP and call the office with any new concerns. The patient has been notified of the lab result and verbalized understanding.  All questions (if any) were answered. Wilma Flavin, RN 08/31/2019 12:20 PM

## 2019-08-31 NOTE — Telephone Encounter (Signed)
-----   Message from Fay Records, MD sent at 08/28/2019 10:40 AM EST ----- Kidney function still a little different than 2-3 years ago   Keep on same meds    She should be on 1/2 of lisinopril / HCTZ Follow BP Make sure she drinks fluids so that urine is pale when she urinates

## 2019-10-20 NOTE — Progress Notes (Signed)
Cardiology Office Note   Date:  10/22/2019   ID:  Heather Saunders, DOB 01/05/1925, MRN [id number]  PCP:  Kathyrn Lass, MD  Cardiologist:   Dorris Carnes, MD   Pt presents for f/u of dyspnea   History of Present Illness: Heather Saunders is a 86 y.o. female with a history of Dyspnea.  Was followed in past  by Dr Tamala Julian  Hx of NSTEMI in 2006  Cath showed nonobstructive CAD  She also has a  Hx of diastolic dysfunction, dyspnea and HTN   Myovue done which was normal   Since she was last in clinic she denies SOB.  Has occasional CP   Infrequent.   Rare dizziness Patient says she hs not checked BP at home recntly     Outpatient Medications Prior to Visit  Medication Sig Dispense Refill  . amLODipine (NORVASC) 5 MG tablet TAKE 1 TABLET BY MOUTH TWICE A DAY 180 tablet 3  . aspirin 81 MG tablet Take 81 mg by mouth daily.    Marland Kitchen atorvastatin (LIPITOR) 40 MG tablet Take 40 mg by mouth daily.    Marland Kitchen ezetimibe (ZETIA) 10 MG tablet Take 1 tablet (10 mg total) by mouth daily. 90 tablet 3  . lisinopril-hydrochlorothiazide (ZESTORETIC) 10-12.5 MG tablet Take 1 tablet by mouth daily. 90 tablet 3  . metoprolol succinate (TOPROL-XL) 50 MG 24 hr tablet Take 50 mg by mouth 2 (two) times daily. Take with or immediately following a meal.    . Multiple Vitamin (MULTIVITAMIN) capsule Take 1 capsule by mouth daily.     No facility-administered medications prior to visit.     Allergies:   Boniva [ibandronic acid] and Fosamax [alendronate sodium]   Past Medical History:  Diagnosis Date  . Chronic kidney disease   . Coronary disease    NonObstructive  . History of nuclear stress test    Nuclear stress test 10/18: EF 96, normal perfusion, low risk  . Hyperlipidemia   . Hypertension   . Osteopenia   . Osteoporosis     Past Surgical History:  Procedure Laterality Date  . LIPOMA EXCISION     removed from Shoulder     Social History:  The patient  reports that she has never smoked. She has never used  smokeless tobacco. She reports current alcohol use of about 2.0 standard drinks of alcohol per week. She reports that she does not use drugs.   Family History:  The patient's family history includes Cancer (age of onset: 27) in her mother.    ROS:  Please see the history of present illness. All other systems are reviewed and  Negative to the above problem except as noted.    PHYSICAL EXAM: VS:  BP 140/68   Pulse 69   Ht 5\' 4"  (1.626 m)   Wt 129 lb 1.9 oz (58.6 kg)   BMI 22.16 kg/m   GEN: Thin 86 yo  in no acute distress  Neck: No JVP  No carotid bruits,  Cardiac: RRR; no murmurs, rubs, or gallops,no edema  Respiratory:  clear to auscultation bilaterally, normal work of breathing GI: soft, nontender, nondistended, + BS  No hepatomegaly  MS: no deformity Moving all extremities   Skin: warm and dry, no rash Neuro:  Strength and sensation are intact Psych: euthymic mood, full affect   EKG:  EKG is not ordered today.  SR 69 bpm  First degree AV block  PR 208 msec  Lipid Panel    Component Value Date/Time  CHOL 194 11/03/2017 1046   TRIG 161 (H) 11/03/2017 1046   HDL 49 11/03/2017 1046   CHOLHDL 4.0 11/03/2017 1046   LDLCALC 113 (H) 11/03/2017 1046      Wt Readings from Last 3 Encounters:  10/22/19 129 lb 1.9 oz (58.6 kg)  08/24/19 131 lb 3.2 oz (59.5 kg)  04/26/19 128 lb 12.8 oz (58.4 kg)      ASSESSMENT AND PLAN:  1  Dyspnea  Gets SOB but also does dancing  Myovue normal  BP is OK I encouraged her to increase her walking, push HR some  Follow   I am not convinced of any active cardiac issues  Would confirm TSH and CBC   These have not been checked in awhile  2.  HTN  BP is good  Follow    3.  HL  Keep on statin  Will check Lipids   4  CAD  NSTEMI 2006   Nonobstructive CAD at that time per report    F/U next winter.    Signed, Dorris Carnes, MD  10/22/2019 11:02 AM    [facility] [place], [place], [place]  [address] Phone: [phone]; Fax: [phone]

## 2019-10-22 ENCOUNTER — Ambulatory Visit: Payer: Medicare Other | Admitting: Internal Medicine

## 2019-10-22 ENCOUNTER — Encounter: Payer: Self-pay | Admitting: Internal Medicine

## 2019-10-22 ENCOUNTER — Other Ambulatory Visit: Payer: Self-pay

## 2019-10-22 VITALS — BP 140/68 | HR 69 | Ht 64.0 in | Wt 129.1 lb

## 2019-10-22 DIAGNOSIS — Z0000 Encounter for general adult medical examination without abnormal findings: Secondary | ICD-10-CM

## 2019-10-22 DIAGNOSIS — I2510 Atherosclerotic heart disease of native coronary artery without angina pectoris: Secondary | ICD-10-CM

## 2019-10-22 DIAGNOSIS — I10 Essential (primary) hypertension: Secondary | ICD-10-CM | POA: Diagnosis not present

## 2019-10-22 DIAGNOSIS — R0602 Shortness of breath: Secondary | ICD-10-CM | POA: Diagnosis not present

## 2019-10-22 NOTE — Patient Instructions (Signed)
Medication Instructions:  No changes *If you need a refill on your cardiac medications before your next appointment, please call your pharmacy*  Lab Work: Today: cbc, bmet, vit D, B12  Testing/Procedures: none  Follow-Up: At [facility], you and your health needs are our priority.  As part of our continuing mission to provide you with exceptional heart care, we have created designated Provider Care Teams.  These Care Teams include your primary Cardiologist (physician) and Advanced Practice Providers (APPs -  Physician Assistants and Nurse Practitioners) who all work together to provide you with the care you need, when you need it.  Your next appointment:   6-8 month(s)  The format for your next appointment:   In Person  Provider:   You may see Dorris Carnes, MD or one of the following Advanced Practice Providers on your designated Care Team:    Richardson Dopp, PA-C  [place], [place]  Daune Perch, NP   Other Instructions

## 2019-10-23 LAB — CBC
Hematocrit: 39.4 % (ref 34.0–46.6)
Hemoglobin: 13.4 g/dL (ref 11.1–15.9)
MCH: 29.5 pg (ref 26.6–33.0)
MCHC: 34 g/dL (ref 31.5–35.7)
MCV: 87 fL (ref 79–97)
Platelets: 242 10*3/uL (ref 150–450)
RBC: 4.54 x10E6/uL (ref 3.77–5.28)
RDW: 13 % (ref 11.7–15.4)
WBC: 13.3 10*3/uL — ABNORMAL HIGH (ref 3.4–10.8)

## 2019-10-23 LAB — VITAMIN D 25 HYDROXY (VIT D DEFICIENCY, FRACTURES): Vit D, 25-Hydroxy: 27.6 ng/mL — ABNORMAL LOW (ref 30.0–100.0)

## 2019-10-23 LAB — BASIC METABOLIC PANEL
BUN/Creatinine Ratio: 22 (ref 12–28)
BUN: 34 mg/dL (ref 10–36)
CO2: 21 mmol/L (ref 20–29)
Calcium: 10.4 mg/dL — ABNORMAL HIGH (ref 8.7–10.3)
Chloride: 101 mmol/L (ref 96–106)
Creatinine, Ser: 1.53 mg/dL — ABNORMAL HIGH (ref 0.57–1.00)
GFR calc Af Amer: 34 mL/min/{1.73_m2} — ABNORMAL LOW (ref >59)
GFR calc non Af Amer: 29 mL/min/{1.73_m2} — ABNORMAL LOW (ref >59)
Glucose: 105 mg/dL — ABNORMAL HIGH (ref 65–99)
Potassium: 4.3 mmol/L (ref 3.5–5.2)
Sodium: 139 mmol/L (ref 134–144)

## 2019-10-23 LAB — VITAMIN B12: Vitamin B-12: 1037 pg/mL (ref 232–1245)

## 2019-10-26 ENCOUNTER — Telehealth: Payer: Self-pay | Admitting: *Deleted

## 2019-10-26 DIAGNOSIS — Z79899 Other long term (current) drug therapy: Secondary | ICD-10-CM

## 2019-10-26 MED ORDER — VITAMIN D3 75 MCG (3000 UT) PO TABS: 3000.0000 mg | ORAL_TABLET | Freq: Every day | ORAL | 10 refills | Status: DC

## 2019-10-26 NOTE — Telephone Encounter (Signed)
-----   Message from Dorris Carnes V, MD sent at 10/24/2019  3:27 PM EST ----- CBC:  White count is a little elevated   Nonspecific Kidney function stable   Cr 1.53   Vit D is low at 27.6   B12 is normal REcomm:   Add 3000 VIt D daily    Check CBC and Vit D in 65months

## 2019-11-04 ENCOUNTER — Ambulatory Visit: Payer: Medicare Other | Attending: Internal Medicine

## 2019-11-04 DIAGNOSIS — Z23 Encounter for immunization: Secondary | ICD-10-CM | POA: Insufficient documentation

## 2019-11-04 NOTE — Progress Notes (Signed)
   Covid-19 Vaccination Clinic  Name:  Heather Saunders    MRN: [id number] DOB: [birth year]  11/04/2019  Heather Saunders was observed post Covid-19 immunization for 15 minutes without incidence. She was provided with Vaccine Information Sheet and instruction to access the V-Safe system.   Heather Saunders was instructed to call 911 with any severe reactions post vaccine: Marland Kitchen Difficulty breathing  . Swelling of your face and throat  . A fast heartbeat  . A bad rash all over your body  . Dizziness and weakness    Immunizations Administered    Name Date Dose VIS Date Route   Pfizer COVID-19 Vaccine 11/04/2019  3:19 PM 0.3 mL 09/10/2019 Intramuscular   Manufacturer: [place]   Lot: [id number]   [place]: [id number]

## 2019-11-29 ENCOUNTER — Ambulatory Visit: Payer: Medicare Other | Attending: Internal Medicine

## 2019-11-29 DIAGNOSIS — Z23 Encounter for immunization: Secondary | ICD-10-CM

## 2019-11-29 NOTE — Progress Notes (Signed)
   Covid-19 Vaccination Clinic  Name:  Heather Saunders    MRN: [id number] DOB: Feb 14, 1925  11/29/2019  [name] was observed post Covid-19 immunization for 15 minutes without incidence. She was provided with Vaccine Information Sheet and instruction to access the V-Safe system.   [name] was instructed to call 911 with any severe reactions post vaccine: Marland Kitchen Difficulty breathing  . Swelling of your face and throat  . A fast heartbeat  . A bad rash all over your body  . Dizziness and weakness    Immunizations Administered    Name Date Dose VIS Date Route   Pfizer COVID-19 Vaccine 11/29/2019  5:31 PM 0.3 mL 09/10/2019 Intramuscular   Manufacturer: [place]   Lot: [id number]   [place]: [id number]

## 2019-12-13 ENCOUNTER — Other Ambulatory Visit: Payer: Self-pay | Admitting: Physician Assistant

## 2019-12-27 ENCOUNTER — Other Ambulatory Visit: Payer: Medicare Other

## 2020-01-07 ENCOUNTER — Other Ambulatory Visit: Payer: Medicare Other | Admitting: *Deleted

## 2020-01-07 ENCOUNTER — Other Ambulatory Visit: Payer: Self-pay

## 2020-01-07 DIAGNOSIS — Z79899 Other long term (current) drug therapy: Secondary | ICD-10-CM

## 2020-01-13 LAB — VITAMIN D 1,25 DIHYDROXY
Vitamin D 1, 25 (OH)2 Total: 38 pg/mL
Vitamin D2 1, 25 (OH)2: <10 pg/mL
Vitamin D3 1, 25 (OH)2: 38 pg/mL

## 2020-01-13 LAB — CBC
Hematocrit: 39.2 % (ref 34.0–46.6)
Hemoglobin: 12.7 g/dL (ref 11.1–15.9)
MCH: 28.8 pg (ref 26.6–33.0)
MCHC: 32.4 g/dL (ref 31.5–35.7)
MCV: 89 fL (ref 79–97)
Platelets: 224 10*3/uL (ref 150–450)
RBC: 4.41 x10E6/uL (ref 3.77–5.28)
RDW: 13.3 % (ref 11.7–15.4)
WBC: 10.7 10*3/uL (ref 3.4–10.8)

## 2020-05-05 ENCOUNTER — Other Ambulatory Visit: Payer: Self-pay | Admitting: Internal Medicine

## 2020-05-19 ENCOUNTER — Encounter: Payer: Self-pay | Admitting: Internal Medicine

## 2020-05-19 ENCOUNTER — Ambulatory Visit: Payer: Medicare Other | Admitting: Internal Medicine

## 2020-05-19 ENCOUNTER — Other Ambulatory Visit: Payer: Self-pay

## 2020-05-19 VITALS — BP 150/60 | HR 72 | Ht 63.5 in | Wt 125.2 lb

## 2020-05-19 DIAGNOSIS — R110 Nausea: Secondary | ICD-10-CM | POA: Diagnosis not present

## 2020-05-19 DIAGNOSIS — K5900 Constipation, unspecified: Secondary | ICD-10-CM | POA: Diagnosis not present

## 2020-05-19 DIAGNOSIS — Z0000 Encounter for general adult medical examination without abnormal findings: Secondary | ICD-10-CM

## 2020-05-19 NOTE — Patient Instructions (Addendum)
Medication Instructions:  No changes *If you need a refill on your cardiac medications before your next appointment, please call your pharmacy*   Lab Work: Today: cbc, cmet, tsh, vit d  If you have labs (blood work) drawn today and your tests are completely normal, you will receive your results only by: Marland Kitchen MyChart Message (if you have MyChart) OR . A paper copy in the mail If you have any lab test that is abnormal or we need to change your treatment, we will call you to review the results.   Testing/Procedures: none   Follow-Up: At [facility], you and your health needs are our priority.  As part of our continuing mission to provide you with exceptional heart care, we have created designated Provider Care Teams.  These Care Teams include your primary Cardiologist (physician) and Advanced Practice Providers (APPs -  Physician Assistants and Nurse Practitioners) who all work together to provide you with the care you need, when you need it.  Your next appointment:   10 month(s) (June 2022)  The format for your next appointment:   In Person  Provider:   You may see Dorris Carnes, MD or one of the following Advanced Practice Providers on your designated Care Team:    Richardson Dopp, PA-C  Robbie Lis, [place]    Other Instructions Discuss with your primary care provider: Stool cards to screen for rectal bleeding

## 2020-05-19 NOTE — Progress Notes (Signed)
Cardiology Office Note   Date:  05/19/2020   ID:  Heather Saunders, DOB Oct 29, 1924, MRN [id number]  PCP:  Kathyrn Lass, MD  Cardiologist:   Dorris Carnes, MD   Pt presents for f/u of dyspnea   History of Present Illness: Heather Saunders is a 86 y.o. female with a history of dyspnea.  Was followed in past  by Dr Tamala Julian  Hx of NSTEMI in 2006  Cath showed nonobstructive CAD  She also has a  Hx of diastolic dysfunction, dyspnea and HTN   Myovue done which was normal    I saw the pt in Jan 2021   She complained of some SOB with dancing   I recomm she  increase walking   Since seen she says she is doing OK  Breathing is OK    She is not as active has she has been given pandemic Pt has had a few dizzy spells but no syncope    From a noncardiac standpoint she complains of constipation and N/ over the past few wks   Weight OK   Tried an herbal tea which helped some    In the past 2 day it has gotten some better Never had colonoscopy   Has noted a few flecks of blood in stool      Outpatient Medications Prior to Visit  Medication Sig Dispense Refill  . amLODipine (NORVASC) 5 MG tablet TAKE 1 TABLET BY MOUTH TWICE A DAY 180 tablet 3  . aspirin 81 MG tablet Take 81 mg by mouth daily.    Marland Kitchen atorvastatin (LIPITOR) 40 MG tablet Take 40 mg by mouth daily.    . Cholecalciferol (VITAMIN D3) 75 MCG (3000 UT) TABS Take 3,000 mg by mouth daily. 30 tablet 10  . ezetimibe (ZETIA) 10 MG tablet Take 1 tablet (10 mg total) by mouth daily. 90 tablet 3  . lisinopril-hydrochlorothiazide (ZESTORETIC) 10-12.5 MG tablet TAKE 1 TABLET BY MOUTH EVERY DAY 90 tablet 1  . metoprolol succinate (TOPROL-XL) 50 MG 24 hr tablet Take 50 mg by mouth 2 (two) times daily. Take with or immediately following a meal.    . Multiple Vitamin (MULTIVITAMIN) capsule Take 1 capsule by mouth daily.     No facility-administered medications prior to visit.     Allergies:   Boniva [ibandronic acid] and Fosamax [alendronate sodium]    Past Medical History:  Diagnosis Date  . Chronic kidney disease   . Coronary disease    NonObstructive  . History of nuclear stress test    Nuclear stress test 10/18: EF 96, normal perfusion, low risk  . Hyperlipidemia   . Hypertension   . Osteopenia   . Osteoporosis     Past Surgical History:  Procedure Laterality Date  . LIPOMA EXCISION     removed from Shoulder     Social History:  The patient  reports that she has never smoked. She has never used smokeless tobacco. She reports current alcohol use of about 2.0 standard drinks of alcohol per week. She reports that she does not use drugs.   Family History:  The patient's family history includes Cancer (age of onset: 42) in her mother.    ROS:  Please see the history of present illness. All other systems are reviewed and  Negative to the above problem except as noted.    PHYSICAL EXAM: VS:  BP (!) 150/60   Pulse 72   Ht 5' 3.5" (1.613 m)   Wt 125 lb 3.2 oz (56.8  kg)   SpO2 96%   BMI 21.83 kg/m   GEN: Thin 86 yo  in no acute distress  Neck: No JVP  No bruits   Cardiac: RRR; no murmurs No LE edema  Respiratory:  clear to auscultation bilaterally GI: soft, nontender, nondistended, + BS  No hepatomegaly  MS: no deformity Moving all extremities   Skin: warm and dry, no rash Neuro:  Strength and sensation are intact Psych: euthymic mood, full affect   EKG:  EKG is not ordered today.   Lipid Panel    Component Value Date/Time   CHOL 194 11/03/2017 1046   TRIG 161 (H) 11/03/2017 1046   HDL 49 11/03/2017 1046   CHOLHDL 4.0 11/03/2017 1046   LDLCALC 113 (H) 11/03/2017 1046      Wt Readings from Last 3 Encounters:  05/19/20 125 lb 3.2 oz (56.8 kg)  10/22/19 129 lb 1.9 oz (58.6 kg)  08/24/19 131 lb 3.2 oz (59.5 kg)      ASSESSMENT AND PLAN:  1  Dyspnea  Normal myovue in past   PT wthout complaints today  Volume good on exam  2.  HTN   BP is a little high  She says it is usually lower   Given age I would  not push lower   3  GI  WIll get labs   If continues follow with Dr Sabra Heck    Pt needs hemoccult cards  Should get from Dr Sabra Heck  4  HL  Cointinue Zetia and lipitor  4  CAD  NSTEMI 2006   Nonobstructive CAD at that time per report     F/U in June 2022     [place], Dorris Carnes, MD  05/19/2020 1:43 PM    [facility], [place], [place]  [address] Phone: [phone]; Fax: [phone]

## 2020-05-20 LAB — COMPREHENSIVE METABOLIC PANEL
ALT: 22 IU/L (ref 0–32)
AST: 26 IU/L (ref 0–40)
Albumin/Globulin Ratio: 2.3 — ABNORMAL HIGH (ref 1.2–2.2)
Albumin: 4.4 g/dL (ref 3.5–4.6)
Alkaline Phosphatase: 78 IU/L (ref 48–121)
BUN/Creatinine Ratio: 22 (ref 12–28)
BUN: 44 mg/dL — ABNORMAL HIGH (ref 10–36)
Bilirubin Total: 1.3 mg/dL — ABNORMAL HIGH (ref 0.0–1.2)
CO2: 24 mmol/L (ref 20–29)
Calcium: 10.2 mg/dL (ref 8.7–10.3)
Chloride: 103 mmol/L (ref 96–106)
Creatinine, Ser: 2 mg/dL — ABNORMAL HIGH (ref 0.57–1.00)
GFR calc Af Amer: 24 mL/min/{1.73_m2} — ABNORMAL LOW (ref >59)
GFR calc non Af Amer: 21 mL/min/{1.73_m2} — ABNORMAL LOW (ref >59)
Globulin, Total: 1.9 g/dL (ref 1.5–4.5)
Glucose: 110 mg/dL — ABNORMAL HIGH (ref 65–99)
Potassium: 4.6 mmol/L (ref 3.5–5.2)
Sodium: 142 mmol/L (ref 134–144)
Total Protein: 6.3 g/dL (ref 6.0–8.5)

## 2020-05-20 LAB — CBC
Hematocrit: 37.5 % (ref 34.0–46.6)
Hemoglobin: 12.2 g/dL (ref 11.1–15.9)
MCH: 29 pg (ref 26.6–33.0)
MCHC: 32.5 g/dL (ref 31.5–35.7)
MCV: 89 fL (ref 79–97)
Platelets: 244 10*3/uL (ref 150–450)
RBC: 4.2 x10E6/uL (ref 3.77–5.28)
RDW: 13.5 % (ref 11.7–15.4)
WBC: 11.9 10*3/uL — ABNORMAL HIGH (ref 3.4–10.8)

## 2020-05-20 LAB — TSH: TSH: 4.69 u[IU]/mL — ABNORMAL HIGH (ref 0.450–4.500)

## 2020-05-20 LAB — VITAMIN D 25 HYDROXY (VIT D DEFICIENCY, FRACTURES): Vit D, 25-Hydroxy: 47.6 ng/mL (ref 30.0–100.0)

## 2020-05-22 ENCOUNTER — Telehealth: Payer: Self-pay | Admitting: Internal Medicine

## 2020-05-22 DIAGNOSIS — I10 Essential (primary) hypertension: Secondary | ICD-10-CM

## 2020-05-22 NOTE — Telephone Encounter (Signed)
Patient aware of results and recommendations. She will increase fluids to stay hydrated. Appointment made for repeat labs.

## 2020-05-22 NOTE — Telephone Encounter (Signed)
    Pt is returning call from [place] to get lab results

## 2020-05-22 NOTE — Telephone Encounter (Signed)
-----   Message from Fay Records, MD sent at 05/21/2020  8:54 PM EDT ----- CBC is normal  Kidney function is a lttle worse than 7 months ago  Vit D is good    Thyroid function is minimally abnormal  I would not make any changes for now   Stay hydrated   Repeat BMET in 2 wks

## 2020-06-06 ENCOUNTER — Other Ambulatory Visit: Payer: Medicare Other | Admitting: *Deleted

## 2020-06-06 ENCOUNTER — Other Ambulatory Visit: Payer: Self-pay

## 2020-06-06 DIAGNOSIS — E782 Mixed hyperlipidemia: Secondary | ICD-10-CM

## 2020-06-06 DIAGNOSIS — I10 Essential (primary) hypertension: Secondary | ICD-10-CM

## 2020-06-06 DIAGNOSIS — I2510 Atherosclerotic heart disease of native coronary artery without angina pectoris: Secondary | ICD-10-CM

## 2020-06-06 LAB — LIPID PANEL
Chol/HDL Ratio: 3.5 ratio (ref 0.0–4.4)
Cholesterol, Total: 163 mg/dL (ref 100–199)
HDL: 47 mg/dL (ref >39)
LDL Chol Calc (NIH): 89 mg/dL (ref 0–99)
Triglycerides: 155 mg/dL — ABNORMAL HIGH (ref 0–149)
VLDL Cholesterol Cal: 27 mg/dL (ref 5–40)

## 2020-06-06 LAB — BASIC METABOLIC PANEL
BUN/Creatinine Ratio: 23 (ref 12–28)
BUN: 44 mg/dL — ABNORMAL HIGH (ref 10–36)
CO2: 25 mmol/L (ref 20–29)
Calcium: 10.3 mg/dL (ref 8.7–10.3)
Chloride: 100 mmol/L (ref 96–106)
Creatinine, Ser: 1.9 mg/dL — ABNORMAL HIGH (ref 0.57–1.00)
GFR calc Af Amer: 26 mL/min/{1.73_m2} — ABNORMAL LOW (ref >59)
GFR calc non Af Amer: 22 mL/min/{1.73_m2} — ABNORMAL LOW (ref >59)
Glucose: 96 mg/dL (ref 65–99)
Potassium: 4.5 mmol/L (ref 3.5–5.2)
Sodium: 139 mmol/L (ref 134–144)

## 2020-06-06 LAB — AST: AST: 25 IU/L (ref 0–40)

## 2020-06-07 ENCOUNTER — Other Ambulatory Visit: Payer: Self-pay | Admitting: *Deleted

## 2020-06-07 DIAGNOSIS — I10 Essential (primary) hypertension: Secondary | ICD-10-CM

## 2020-06-07 NOTE — Telephone Encounter (Signed)
-----   Message from Fay Records, MD sent at 06/06/2020 10:28 PM EDT ----- Electrolytes are OK Kidney function is stable from 2 wks ago.  Still up from earlier this year. I would cut lisinopril / HCTZ in 1/2  Needs BMET in 3 wks   Can she have BP check on that day  WIll prob need to adjust meds  Lipids are good

## 2020-06-07 NOTE — Telephone Encounter (Signed)
Reviewed results/recommendations with patient.  She will decrease lisinopril/hctz to half and return on 06/28/20 for BMET and nurse visit for BP check.

## 2020-06-28 ENCOUNTER — Ambulatory Visit (INDEPENDENT_AMBULATORY_CARE_PROVIDER_SITE_OTHER): Payer: Medicare Other

## 2020-06-28 ENCOUNTER — Other Ambulatory Visit: Payer: Medicare Other | Admitting: *Deleted

## 2020-06-28 ENCOUNTER — Other Ambulatory Visit: Payer: Self-pay

## 2020-06-28 VITALS — BP 150/72 | HR 68 | Ht 63.5 in

## 2020-06-28 DIAGNOSIS — I10 Essential (primary) hypertension: Secondary | ICD-10-CM

## 2020-06-28 LAB — BASIC METABOLIC PANEL
BUN/Creatinine Ratio: 19 (ref 12–28)
BUN: 34 mg/dL (ref 10–36)
CO2: 25 mmol/L (ref 20–29)
Calcium: 10 mg/dL (ref 8.7–10.3)
Chloride: 103 mmol/L (ref 96–106)
Creatinine, Ser: 1.75 mg/dL — ABNORMAL HIGH (ref 0.57–1.00)
GFR calc Af Amer: 29 mL/min/{1.73_m2} — ABNORMAL LOW (ref >59)
GFR calc non Af Amer: 25 mL/min/{1.73_m2} — ABNORMAL LOW (ref >59)
Glucose: 107 mg/dL — ABNORMAL HIGH (ref 65–99)
Potassium: 4.8 mmol/L (ref 3.5–5.2)
Sodium: 141 mmol/L (ref 134–144)

## 2020-06-28 NOTE — Patient Instructions (Signed)
Medication Instructions:  We will be in contact with you if medication changes are needed.  *If you need a refill on your cardiac medications before your next appointment, please call your pharmacy*   Lab Work: TODAY: BMET If you have labs (blood work) drawn today and your tests are completely normal, you will receive your results only by: Marland Kitchen MyChart Message (if you have MyChart) OR . A paper copy in the mail If you have any lab test that is abnormal or we need to change your treatment, we will call you to review the results.   Follow-Up: At [facility], you and your health needs are our priority.  As part of our continuing mission to provide you with exceptional heart care, we have created designated Provider Care Teams.  These Care Teams include your primary Cardiologist (physician) and Advanced Practice Providers (APPs -  Physician Assistants and Nurse Practitioners) who all work together to provide you with the care you need, when you need it.  We recommend signing up for the patient portal called "MyChart".  Sign up information is provided on this After Visit Summary.  MyChart is used to connect with patients for Virtual Visits (Telemedicine).  Patients are able to view lab/test results, encounter notes, upcoming appointments, etc.  Non-urgent messages can be sent to your provider as well.   To learn more about what you can do with MyChart, go to [email or web address].

## 2020-06-28 NOTE — Progress Notes (Signed)
1.) Reason for visit: Blood pressure check   2.) Name of MD requesting visit: Dr. Harrington Challenger  3.) Assessment and plan per MD: Patient's blood pressure was 150/72. She recently reduced dose of lisinopril-HCTZ to 1/2 tablet due to kidney function. Patient is having repeat BMET today. Dr. Harrington Challenger will be made aware of BP and patient will be contacted with lab results and further recommendations.

## 2020-07-20 MED ORDER — LISINOPRIL-HYDROCHLOROTHIAZIDE 10-12.5 MG PO TABS
0.5000 | ORAL_TABLET | Freq: Every day | ORAL | 1 refills | Status: DC
Start: 2020-07-20 — End: 2021-03-17

## 2020-07-30 ENCOUNTER — Other Ambulatory Visit: Payer: Self-pay | Admitting: Internal Medicine

## 2020-10-06 DIAGNOSIS — R110 Nausea: Secondary | ICD-10-CM | POA: Diagnosis not present

## 2020-10-06 DIAGNOSIS — R0602 Shortness of breath: Secondary | ICD-10-CM | POA: Diagnosis not present

## 2020-10-09 ENCOUNTER — Other Ambulatory Visit: Payer: Self-pay | Admitting: Family Medicine

## 2020-10-09 ENCOUNTER — Other Ambulatory Visit (HOSPITAL_COMMUNITY): Payer: Self-pay | Admitting: Family Medicine

## 2020-10-09 DIAGNOSIS — R7989 Other specified abnormal findings of blood chemistry: Secondary | ICD-10-CM

## 2020-10-09 DIAGNOSIS — R0602 Shortness of breath: Secondary | ICD-10-CM

## 2020-10-09 DIAGNOSIS — R110 Nausea: Secondary | ICD-10-CM

## 2020-10-10 ENCOUNTER — Ambulatory Visit
Admission: RE | Admit: 2020-10-10 | Discharge: 2020-10-10 | Disposition: A | Discharge status: Home / Self Care | Payer: Medicare Other | Source: Ambulatory Visit | Attending: Family Medicine | Admitting: Family Medicine

## 2020-10-10 ENCOUNTER — Other Ambulatory Visit: Payer: Self-pay | Admitting: Family Medicine

## 2020-10-10 DIAGNOSIS — R110 Nausea: Secondary | ICD-10-CM

## 2020-10-10 DIAGNOSIS — K5730 Diverticulosis of large intestine without perforation or abscess without bleeding: Secondary | ICD-10-CM | POA: Diagnosis not present

## 2020-10-24 DIAGNOSIS — I10 Essential (primary) hypertension: Secondary | ICD-10-CM | POA: Diagnosis not present

## 2020-10-24 DIAGNOSIS — M810 Age-related osteoporosis without current pathological fracture: Secondary | ICD-10-CM | POA: Diagnosis not present

## 2020-10-24 DIAGNOSIS — E782 Mixed hyperlipidemia: Secondary | ICD-10-CM | POA: Diagnosis not present

## 2020-10-24 DIAGNOSIS — I129 Hypertensive chronic kidney disease with stage 1 through stage 4 chronic kidney disease, or unspecified chronic kidney disease: Secondary | ICD-10-CM | POA: Diagnosis not present

## 2020-10-24 DIAGNOSIS — I2510 Atherosclerotic heart disease of native coronary artery without angina pectoris: Secondary | ICD-10-CM | POA: Diagnosis not present

## 2020-10-24 DIAGNOSIS — N1832 Chronic kidney disease, stage 3b: Secondary | ICD-10-CM | POA: Diagnosis not present

## 2020-10-30 ENCOUNTER — Ambulatory Visit (HOSPITAL_COMMUNITY): Payer: Medicare Other | Attending: Cardiovascular Disease

## 2020-10-30 ENCOUNTER — Other Ambulatory Visit: Payer: Self-pay

## 2020-10-30 DIAGNOSIS — R7989 Other specified abnormal findings of blood chemistry: Secondary | ICD-10-CM

## 2020-10-30 DIAGNOSIS — R0602 Shortness of breath: Secondary | ICD-10-CM | POA: Diagnosis not present

## 2020-10-30 LAB — ECHOCARDIOGRAM COMPLETE
Area-P 1/2: 3.48 cm2
S' Lateral: 1.9 cm

## 2020-12-01 DIAGNOSIS — I129 Hypertensive chronic kidney disease with stage 1 through stage 4 chronic kidney disease, or unspecified chronic kidney disease: Secondary | ICD-10-CM | POA: Diagnosis not present

## 2020-12-01 DIAGNOSIS — M810 Age-related osteoporosis without current pathological fracture: Secondary | ICD-10-CM | POA: Diagnosis not present

## 2020-12-01 DIAGNOSIS — E782 Mixed hyperlipidemia: Secondary | ICD-10-CM | POA: Diagnosis not present

## 2020-12-01 DIAGNOSIS — N1832 Chronic kidney disease, stage 3b: Secondary | ICD-10-CM | POA: Diagnosis not present

## 2020-12-01 DIAGNOSIS — I10 Essential (primary) hypertension: Secondary | ICD-10-CM | POA: Diagnosis not present

## 2020-12-01 DIAGNOSIS — I2510 Atherosclerotic heart disease of native coronary artery without angina pectoris: Secondary | ICD-10-CM | POA: Diagnosis not present

## 2020-12-11 ENCOUNTER — Other Ambulatory Visit: Payer: Self-pay | Admitting: Physician Assistant

## 2020-12-15 DIAGNOSIS — I10 Essential (primary) hypertension: Secondary | ICD-10-CM | POA: Diagnosis not present

## 2021-01-16 DIAGNOSIS — I129 Hypertensive chronic kidney disease with stage 1 through stage 4 chronic kidney disease, or unspecified chronic kidney disease: Secondary | ICD-10-CM | POA: Diagnosis not present

## 2021-01-25 DIAGNOSIS — N1832 Chronic kidney disease, stage 3b: Secondary | ICD-10-CM | POA: Diagnosis not present

## 2021-01-25 DIAGNOSIS — I10 Essential (primary) hypertension: Secondary | ICD-10-CM | POA: Diagnosis not present

## 2021-01-25 DIAGNOSIS — I129 Hypertensive chronic kidney disease with stage 1 through stage 4 chronic kidney disease, or unspecified chronic kidney disease: Secondary | ICD-10-CM | POA: Diagnosis not present

## 2021-01-25 DIAGNOSIS — I2510 Atherosclerotic heart disease of native coronary artery without angina pectoris: Secondary | ICD-10-CM | POA: Diagnosis not present

## 2021-01-25 DIAGNOSIS — E782 Mixed hyperlipidemia: Secondary | ICD-10-CM | POA: Diagnosis not present

## 2021-01-25 DIAGNOSIS — M810 Age-related osteoporosis without current pathological fracture: Secondary | ICD-10-CM | POA: Diagnosis not present

## 2021-02-16 DIAGNOSIS — I129 Hypertensive chronic kidney disease with stage 1 through stage 4 chronic kidney disease, or unspecified chronic kidney disease: Secondary | ICD-10-CM | POA: Diagnosis not present

## 2021-02-22 DIAGNOSIS — I129 Hypertensive chronic kidney disease with stage 1 through stage 4 chronic kidney disease, or unspecified chronic kidney disease: Secondary | ICD-10-CM | POA: Diagnosis not present

## 2021-02-23 DIAGNOSIS — I129 Hypertensive chronic kidney disease with stage 1 through stage 4 chronic kidney disease, or unspecified chronic kidney disease: Secondary | ICD-10-CM | POA: Diagnosis not present

## 2021-02-23 DIAGNOSIS — I2510 Atherosclerotic heart disease of native coronary artery without angina pectoris: Secondary | ICD-10-CM | POA: Diagnosis not present

## 2021-02-23 DIAGNOSIS — N1832 Chronic kidney disease, stage 3b: Secondary | ICD-10-CM | POA: Diagnosis not present

## 2021-02-23 DIAGNOSIS — I10 Essential (primary) hypertension: Secondary | ICD-10-CM | POA: Diagnosis not present

## 2021-02-23 DIAGNOSIS — M810 Age-related osteoporosis without current pathological fracture: Secondary | ICD-10-CM | POA: Diagnosis not present

## 2021-02-23 DIAGNOSIS — E782 Mixed hyperlipidemia: Secondary | ICD-10-CM | POA: Diagnosis not present

## 2021-03-05 DIAGNOSIS — I129 Hypertensive chronic kidney disease with stage 1 through stage 4 chronic kidney disease, or unspecified chronic kidney disease: Secondary | ICD-10-CM | POA: Diagnosis not present

## 2021-03-05 DIAGNOSIS — I2510 Atherosclerotic heart disease of native coronary artery without angina pectoris: Secondary | ICD-10-CM | POA: Diagnosis not present

## 2021-03-05 DIAGNOSIS — M810 Age-related osteoporosis without current pathological fracture: Secondary | ICD-10-CM | POA: Diagnosis not present

## 2021-03-05 DIAGNOSIS — I10 Essential (primary) hypertension: Secondary | ICD-10-CM | POA: Diagnosis not present

## 2021-03-05 DIAGNOSIS — N184 Chronic kidney disease, stage 4 (severe): Secondary | ICD-10-CM | POA: Diagnosis not present

## 2021-03-05 DIAGNOSIS — E782 Mixed hyperlipidemia: Secondary | ICD-10-CM | POA: Diagnosis not present

## 2021-03-09 DIAGNOSIS — K5901 Slow transit constipation: Secondary | ICD-10-CM | POA: Diagnosis not present

## 2021-03-09 DIAGNOSIS — I129 Hypertensive chronic kidney disease with stage 1 through stage 4 chronic kidney disease, or unspecified chronic kidney disease: Secondary | ICD-10-CM | POA: Diagnosis not present

## 2021-03-09 DIAGNOSIS — Z6820 Body mass index (BMI) 20.0-20.9, adult: Secondary | ICD-10-CM | POA: Diagnosis not present

## 2021-03-09 DIAGNOSIS — R110 Nausea: Secondary | ICD-10-CM | POA: Diagnosis not present

## 2021-03-09 DIAGNOSIS — N184 Chronic kidney disease, stage 4 (severe): Secondary | ICD-10-CM | POA: Diagnosis not present

## 2021-03-09 DIAGNOSIS — R6883 Chills (without fever): Secondary | ICD-10-CM | POA: Diagnosis not present

## 2021-03-13 DIAGNOSIS — R6883 Chills (without fever): Secondary | ICD-10-CM | POA: Diagnosis not present

## 2021-03-13 DIAGNOSIS — I10 Essential (primary) hypertension: Secondary | ICD-10-CM | POA: Diagnosis not present

## 2021-03-13 DIAGNOSIS — R110 Nausea: Secondary | ICD-10-CM | POA: Diagnosis not present

## 2021-03-13 DIAGNOSIS — R634 Abnormal weight loss: Secondary | ICD-10-CM | POA: Diagnosis not present

## 2021-03-17 ENCOUNTER — Inpatient Hospital Stay (HOSPITAL_COMMUNITY)
Admission: EM | Admit: 2021-03-17 | Discharge: 2021-03-30 | DRG: 308 | Disposition: A | Discharge status: Skilled Nursing Facility | Payer: Medicare Other | Attending: Internal Medicine | Admitting: Internal Medicine

## 2021-03-17 ENCOUNTER — Other Ambulatory Visit: Payer: Self-pay

## 2021-03-17 ENCOUNTER — Encounter (HOSPITAL_COMMUNITY): Payer: Self-pay | Admitting: Emergency Medicine

## 2021-03-17 ENCOUNTER — Emergency Department (HOSPITAL_COMMUNITY): Payer: Medicare Other

## 2021-03-17 DIAGNOSIS — N289 Disorder of kidney and ureter, unspecified: Secondary | ICD-10-CM | POA: Diagnosis not present

## 2021-03-17 DIAGNOSIS — I4819 Other persistent atrial fibrillation: Principal | ICD-10-CM | POA: Diagnosis present

## 2021-03-17 DIAGNOSIS — R609 Edema, unspecified: Secondary | ICD-10-CM

## 2021-03-17 DIAGNOSIS — J189 Pneumonia, unspecified organism: Secondary | ICD-10-CM | POA: Diagnosis not present

## 2021-03-17 DIAGNOSIS — Z888 Allergy status to other drugs, medicaments and biological substances status: Secondary | ICD-10-CM | POA: Diagnosis not present

## 2021-03-17 DIAGNOSIS — J810 Acute pulmonary edema: Secondary | ICD-10-CM | POA: Diagnosis not present

## 2021-03-17 DIAGNOSIS — I4891 Unspecified atrial fibrillation: Secondary | ICD-10-CM | POA: Diagnosis not present

## 2021-03-17 DIAGNOSIS — R112 Nausea with vomiting, unspecified: Secondary | ICD-10-CM | POA: Diagnosis not present

## 2021-03-17 DIAGNOSIS — I213 ST elevation (STEMI) myocardial infarction of unspecified site: Secondary | ICD-10-CM | POA: Diagnosis not present

## 2021-03-17 DIAGNOSIS — J90 Pleural effusion, not elsewhere classified: Secondary | ICD-10-CM | POA: Diagnosis not present

## 2021-03-17 DIAGNOSIS — R5383 Other fatigue: Secondary | ICD-10-CM | POA: Diagnosis not present

## 2021-03-17 DIAGNOSIS — I513 Intracardiac thrombosis, not elsewhere classified: Secondary | ICD-10-CM | POA: Diagnosis not present

## 2021-03-17 DIAGNOSIS — I5031 Acute diastolic (congestive) heart failure: Secondary | ICD-10-CM | POA: Diagnosis not present

## 2021-03-17 DIAGNOSIS — I499 Cardiac arrhythmia, unspecified: Secondary | ICD-10-CM | POA: Diagnosis not present

## 2021-03-17 DIAGNOSIS — N184 Chronic kidney disease, stage 4 (severe): Secondary | ICD-10-CM | POA: Diagnosis not present

## 2021-03-17 DIAGNOSIS — I340 Nonrheumatic mitral (valve) insufficiency: Secondary | ICD-10-CM | POA: Diagnosis not present

## 2021-03-17 DIAGNOSIS — I5189 Other ill-defined heart diseases: Secondary | ICD-10-CM | POA: Diagnosis not present

## 2021-03-17 DIAGNOSIS — N189 Chronic kidney disease, unspecified: Secondary | ICD-10-CM | POA: Diagnosis not present

## 2021-03-17 DIAGNOSIS — M810 Age-related osteoporosis without current pathological fracture: Secondary | ICD-10-CM | POA: Diagnosis present

## 2021-03-17 DIAGNOSIS — Z7982 Long term (current) use of aspirin: Secondary | ICD-10-CM

## 2021-03-17 DIAGNOSIS — Z20822 Contact with and (suspected) exposure to covid-19: Secondary | ICD-10-CM | POA: Diagnosis present

## 2021-03-17 DIAGNOSIS — I272 Pulmonary hypertension, unspecified: Secondary | ICD-10-CM | POA: Diagnosis present

## 2021-03-17 DIAGNOSIS — N179 Acute kidney failure, unspecified: Secondary | ICD-10-CM | POA: Diagnosis present

## 2021-03-17 DIAGNOSIS — I517 Cardiomegaly: Secondary | ICD-10-CM | POA: Diagnosis not present

## 2021-03-17 DIAGNOSIS — I509 Heart failure, unspecified: Secondary | ICD-10-CM | POA: Diagnosis not present

## 2021-03-17 DIAGNOSIS — N281 Cyst of kidney, acquired: Secondary | ICD-10-CM | POA: Diagnosis not present

## 2021-03-17 DIAGNOSIS — I371 Nonrheumatic pulmonary valve insufficiency: Secondary | ICD-10-CM | POA: Diagnosis not present

## 2021-03-17 DIAGNOSIS — I252 Old myocardial infarction: Secondary | ICD-10-CM

## 2021-03-17 DIAGNOSIS — E785 Hyperlipidemia, unspecified: Secondary | ICD-10-CM | POA: Diagnosis not present

## 2021-03-17 DIAGNOSIS — R278 Other lack of coordination: Secondary | ICD-10-CM | POA: Diagnosis not present

## 2021-03-17 DIAGNOSIS — R0600 Dyspnea, unspecified: Secondary | ICD-10-CM

## 2021-03-17 DIAGNOSIS — R2681 Unsteadiness on feet: Secondary | ICD-10-CM | POA: Diagnosis not present

## 2021-03-17 DIAGNOSIS — I2510 Atherosclerotic heart disease of native coronary artery without angina pectoris: Secondary | ICD-10-CM | POA: Diagnosis not present

## 2021-03-17 DIAGNOSIS — I25118 Atherosclerotic heart disease of native coronary artery with other forms of angina pectoris: Secondary | ICD-10-CM | POA: Diagnosis not present

## 2021-03-17 DIAGNOSIS — J029 Acute pharyngitis, unspecified: Secondary | ICD-10-CM | POA: Diagnosis not present

## 2021-03-17 DIAGNOSIS — E782 Mixed hyperlipidemia: Secondary | ICD-10-CM | POA: Diagnosis present

## 2021-03-17 DIAGNOSIS — Z7401 Bed confinement status: Secondary | ICD-10-CM | POA: Diagnosis not present

## 2021-03-17 DIAGNOSIS — M6281 Muscle weakness (generalized): Secondary | ICD-10-CM | POA: Diagnosis not present

## 2021-03-17 DIAGNOSIS — E872 Acidosis: Secondary | ICD-10-CM | POA: Diagnosis not present

## 2021-03-17 DIAGNOSIS — R778 Other specified abnormalities of plasma proteins: Secondary | ICD-10-CM | POA: Diagnosis not present

## 2021-03-17 DIAGNOSIS — Z743 Need for continuous supervision: Secondary | ICD-10-CM | POA: Diagnosis not present

## 2021-03-17 DIAGNOSIS — R0609 Other forms of dyspnea: Secondary | ICD-10-CM

## 2021-03-17 DIAGNOSIS — J811 Chronic pulmonary edema: Secondary | ICD-10-CM

## 2021-03-17 DIAGNOSIS — D631 Anemia in chronic kidney disease: Secondary | ICD-10-CM | POA: Diagnosis not present

## 2021-03-17 DIAGNOSIS — Z0389 Encounter for observation for other suspected diseases and conditions ruled out: Secondary | ICD-10-CM | POA: Diagnosis not present

## 2021-03-17 DIAGNOSIS — R262 Difficulty in walking, not elsewhere classified: Secondary | ICD-10-CM | POA: Diagnosis not present

## 2021-03-17 DIAGNOSIS — I10 Essential (primary) hypertension: Secondary | ICD-10-CM | POA: Diagnosis not present

## 2021-03-17 DIAGNOSIS — I5033 Acute on chronic diastolic (congestive) heart failure: Secondary | ICD-10-CM | POA: Diagnosis not present

## 2021-03-17 DIAGNOSIS — J9811 Atelectasis: Secondary | ICD-10-CM | POA: Diagnosis not present

## 2021-03-17 DIAGNOSIS — R0602 Shortness of breath: Secondary | ICD-10-CM | POA: Diagnosis not present

## 2021-03-17 DIAGNOSIS — I083 Combined rheumatic disorders of mitral, aortic and tricuspid valves: Secondary | ICD-10-CM | POA: Diagnosis not present

## 2021-03-17 DIAGNOSIS — Z803 Family history of malignant neoplasm of breast: Secondary | ICD-10-CM

## 2021-03-17 DIAGNOSIS — Z79899 Other long term (current) drug therapy: Secondary | ICD-10-CM

## 2021-03-17 DIAGNOSIS — I130 Hypertensive heart and chronic kidney disease with heart failure and stage 1 through stage 4 chronic kidney disease, or unspecified chronic kidney disease: Secondary | ICD-10-CM | POA: Diagnosis present

## 2021-03-17 DIAGNOSIS — D649 Anemia, unspecified: Secondary | ICD-10-CM | POA: Diagnosis not present

## 2021-03-17 DIAGNOSIS — Z09 Encounter for follow-up examination after completed treatment for conditions other than malignant neoplasm: Secondary | ICD-10-CM

## 2021-03-17 DIAGNOSIS — R000 Tachycardia, unspecified: Secondary | ICD-10-CM | POA: Diagnosis not present

## 2021-03-17 DIAGNOSIS — I129 Hypertensive chronic kidney disease with stage 1 through stage 4 chronic kidney disease, or unspecified chronic kidney disease: Secondary | ICD-10-CM | POA: Diagnosis not present

## 2021-03-17 LAB — CBC
HCT: 33.9 % — ABNORMAL LOW (ref 36.0–46.0)
Hemoglobin: 10.8 g/dL — ABNORMAL LOW (ref 12.0–15.0)
MCH: 28 pg (ref 26.0–34.0)
MCHC: 31.9 g/dL (ref 30.0–36.0)
MCV: 87.8 fL (ref 80.0–100.0)
Platelets: 194 10*3/uL (ref 150–400)
RBC: 3.86 MIL/uL — ABNORMAL LOW (ref 3.87–5.11)
RDW: 15.6 % — ABNORMAL HIGH (ref 11.5–15.5)
WBC: 13.4 10*3/uL — ABNORMAL HIGH (ref 4.0–10.5)
nRBC: 0 % (ref 0.0–0.2)

## 2021-03-17 MED ORDER — METOPROLOL TARTRATE 5 MG/5ML IV SOLN
5.0000 mg | Freq: Once | INTRAVENOUS | Status: AC
  Administered 2021-03-18: 5 mg via INTRAVENOUS
  Filled 2021-03-17: qty 5

## 2021-03-17 NOTE — ED Triage Notes (Signed)
PT arrived via [facility] from home. Pt reports not feeling well over the last few weeks, saw PCP. Today she had a sore throat, called 911 because she was concerned she was having an MI. Hx of stemi. EMS found pt in afib rvr, rate 95-160, AOX4. 18g LFA, 1081m NS given.

## 2021-03-17 NOTE — ED Provider Notes (Addendum)
[facility] EMERGENCY DEPARTMENT Provider Note   CSN: [id number] Arrival date & time: 03/17/21  2230     History Chief Complaint  Patient presents with   Afib RVR    Heather Saunders is a 87 y.o. female.  HPI     This is a 87 year old female with a history of coronary artery disease, chronic kidney disease, hypertension, hyperlipidemia who presents in A. fib with RVR.  Per the patient, she is not generally felt well over the last month.  She has had some changes in her blood pressure medications.  She also has had some constipation which is treated with MiraLAX by her primary physician.  Tonight she had sudden onset of throat pain.  She states that she thought maybe she was having a heart attack.  She called EMS.  They noted that she was in atrial fibrillation with RVR.  No known history of arrhythmias.  She does not feel any chest pain, shortness of breath, palpitations.  No lower extremity swelling.  No recent fevers.  Past Medical History:  Diagnosis Date   Chronic kidney disease    Coronary disease    NonObstructive   History of nuclear stress test    Nuclear stress test 10/18: EF 96, normal perfusion, low risk   Hyperlipidemia    Hypertension    Myocardial infarct [facility]) 2006   Osteopenia    Osteoporosis     Patient Active Problem List   Diagnosis Date Noted   Essential hypertension 08/03/2018   Coronary artery disease involving native coronary artery of native heart without angina pectoris 08/03/2018   Dyspnea on exertion 08/03/2018   Mixed hyperlipidemia 08/03/2018    Past Surgical History:  Procedure Laterality Date   LIPOMA EXCISION     removed from Shoulder     OB History   No obstetric history on file.     Family History  Problem Relation Age of Onset   Cancer Mother 1       breast    Social History   Tobacco Use   Smoking status: Never   Smokeless tobacco: Never  Vaping Use   Vaping Use: Never used  Substance Use Topics    Alcohol use: Yes    Alcohol/week: 2.0 standard drinks    Types: 2 Glasses of wine per week   Drug use: No    Home Medications Prior to Admission medications   Medication Sig Start Date End Date Taking? Authorizing Provider  acetaminophen (TYLENOL) 500 MG tablet Take 500 mg by mouth every 6 (six) hours as needed for mild pain.   Yes [provider]  amLODipine (NORVASC) 5 MG tablet TAKE 1 TABLET BY MOUTH TWICE A DAY Patient taking differently: Take 5 mg by mouth in the morning and at bedtime. 12/12/20  Yes Fay Records, MD  aspirin 81 MG tablet Take 81 mg by mouth daily.   Yes [provider]  atorvastatin (LIPITOR) 40 MG tablet Take 40 mg by mouth daily.   Yes [provider]  Cholecalciferol 100 MCG (4000 UT) CAPS Take 4,000 Units by mouth daily.   Yes [provider]  ezetimibe (ZETIA) 10 MG tablet TAKE 1 TABLET BY MOUTH EVERY DAY Patient taking differently: Take 10 mg by mouth daily. 07/31/20  Yes Fay Records, MD  hydrALAZINE (APRESOLINE) 25 MG tablet Take 25 mg by mouth in the morning, at noon, and at bedtime. With food   Yes [provider]  metoprolol tartrate (LOPRESSOR) 50  MG tablet Take 50 mg by mouth 2 (two) times daily. With food   Yes [provider]  Multiple Vitamin (MULTIVITAMIN) capsule Take 1 capsule by mouth daily.   Yes [provider]  polyethylene glycol (MIRALAX MIX-IN PAX) 17 g packet Take 17 g by mouth daily as needed for mild constipation.   Yes [provider]    Allergies    Boniva [ibandronic acid] and Fosamax [alendronate sodium]  Review of Systems   Review of Systems  Constitutional:  Negative for fever.  HENT:  Positive for sore throat.   Respiratory:  Negative for shortness of breath.   Cardiovascular:  Negative for chest pain and palpitations.  Gastrointestinal:  Negative for abdominal pain, nausea and vomiting.  Genitourinary:  Negative for dysuria.  All other systems  reviewed and are negative.  Physical Exam Updated Vital Signs BP 121/87   Pulse (!) 114   Temp 97.8 F (36.6 C) (Oral)   Resp (!) 31   Ht 1.626 m ('5\' 4"'$ )   Wt 56.7 kg   SpO2 96%   BMI 21.46 kg/m   Physical Exam Vitals and nursing note reviewed.  Constitutional:      Appearance: She is well-developed. She is not ill-appearing.  HENT:     Head: Normocephalic and atraumatic.     Nose: Nose normal.     Mouth/Throat:     Mouth: Mucous membranes are moist.     Pharynx: No oropharyngeal exudate or posterior oropharyngeal erythema.  Eyes:     Pupils: Pupils are equal, round, and reactive to light.  Cardiovascular:     Rate and Rhythm: Tachycardia present. Rhythm irregular.     Heart sounds: Normal heart sounds.  Pulmonary:     Effort: Pulmonary effort is normal. No respiratory distress.     Breath sounds: No wheezing.  Abdominal:     General: Bowel sounds are normal.     Palpations: Abdomen is soft.  Musculoskeletal:     Cervical back: Neck supple.     Right lower leg: No edema.     Left lower leg: No edema.  Skin:    General: Skin is warm and dry.  Neurological:     Mental Status: She is alert and oriented to person, place, and time.  Psychiatric:        Mood and Affect: Mood normal.    ED Results / Procedures / Treatments   Labs (all labs ordered are listed, but only abnormal results are displayed) Labs Reviewed  BASIC METABOLIC PANEL - Abnormal; Notable for the following components:      Result Value   CO2 14 (*)    Glucose, Bld 118 (*)    BUN 80 (*)    Creatinine, Ser 5.04 (*)    Calcium 8.6 (*)    GFR, Estimated 7 (*)    All other components within normal limits  CBC - Abnormal; Notable for the following components:   WBC 13.4 (*)    RBC 3.86 (*)    Hemoglobin 10.8 (*)    HCT 33.9 (*)    RDW 15.6 (*)    All other components within normal limits  BRAIN NATRIURETIC PEPTIDE - Abnormal; Notable for the following components:   B Natriuretic Peptide 815.8  (*)    All other components within normal limits  BASIC METABOLIC PANEL - Abnormal; Notable for the following components:   CO2 14 (*)    Glucose, Bld 122 (*)    BUN 77 (*)  Creatinine, Ser 5.05 (*)    Calcium 8.8 (*)    GFR, Estimated 7 (*)    All other components within normal limits  TROPONIN I (HIGH SENSITIVITY) - Abnormal; Notable for the following components:   Troponin I (High Sensitivity) 53 (*)    All other components within normal limits  TROPONIN I (HIGH SENSITIVITY) - Abnormal; Notable for the following components:   Troponin I (High Sensitivity) 51 (*)    All other components within normal limits  RESP PANEL BY RT-PCR (FLU A&B, COVID) [id number]  MAGNESIUM    EKG EKG Interpretation  Date/Time:  Saturday March 17 2021 23:09:25 EDT Ventricular Rate:  130 PR Interval:    QRS Duration: 76 QT Interval:  328 QTC Calculation: 483 R Axis:   53 Text Interpretation: Atrial fibrillation ST depression, probably rate related Confirmed by Thayer Jew [phone]) on 03/17/2021 11:47:50 PM  Radiology DG Chest Port 1 View  Result Date: 03/17/2021 CLINICAL DATA:  Atrial fibrillation, sore throat EXAM: PORTABLE CHEST 1 VIEW COMPARISON:  10/06/2020 FINDINGS: Single frontal view of the chest demonstrates an enlarged cardiac silhouette. There is central vascular prominence, with mild diffuse interstitial opacities consistent with developing edema. No effusion or pneumothorax. IMPRESSION: 1. Findings consistent with mild interstitial edema. Electronically Signed   By: Randa Ngo M.D.   On: 03/17/2021 23:34    Procedures .Critical Care  Date/Time: 03/18/2021 2:27 AM Performed by: Merryl Hacker, MD Authorized by: Merryl Hacker, MD   Critical care provider statement:    Critical care time (minutes):  45   Critical care was necessary to treat or prevent imminent or life-threatening deterioration of the following conditions:  Cardiac failure   Critical care was time spent  personally by me on the following activities:  Discussions with consultants, evaluation of patient's response to treatment, examination of patient, ordering and performing treatments and interventions, ordering and review of laboratory studies, ordering and review of radiographic studies, pulse oximetry, re-evaluation of patient's condition, obtaining history from patient or surrogate and review of old charts   Medications Ordered in ED Medications  metoprolol tartrate (LOPRESSOR) injection 5 mg (5 mg Intravenous Given 03/18/21 0017)  sodium chloride 0.9 % bolus 500 mL (500 mLs Intravenous New Bag/Given (Non-Interop) 03/18/21 0218)  metoprolol tartrate (LOPRESSOR) injection 10 mg (10 mg Intravenous Given 03/18/21 0213)  metoprolol tartrate (LOPRESSOR) tablet 50 mg (50 mg Oral Given 03/18/21 0216)    ED Course  I have reviewed the triage vital signs and the nursing notes.  Pertinent labs & imaging results that were available during my care of the patient were reviewed by me and considered in my medical decision making (see chart for details).  Clinical Course as of 03/18/21 0231  Sun Mar 18, 2021  0046 Patient was able to pull up her lab work from her PCP office.  Recent lab work as of 6/14 with a creatinine of 3.0.  We will redraw labs to verify increase.  Heart rate 115-120.  She did not take her metoprolol today.  She was given 1 more dose of IV metoprolol and her nighttime p.o. dose. [CH]    Clinical Course User Index [CH] Hektor Huston, Barbette Hair, MD   MDM Rules/Calculators/A&P                          Patient presents with throat discomfort and was found to be in atrial fibrillation with RVR.  She has no known history.  She  does not feel palpitations or chest pain.  She generally states she has not felt well over the last month.  Labs obtained.  She is in atrial fibrillation upon arrival with a rate in the 130s.  She was given a dose of IV metoprolol.  Rate improved to the 110-120.  She did not  take her nighttime dose of metoprolol.  She was given 1 additional dose of IV metoprolol and her nighttime dose.  She is remained stable with rates in the 110s.  Her lab work is notable for worsening kidney function with creatinine of 5.  Most recent baseline around 3 based on review of her outpatient lab work.  She is also significantly uremic with a BUN 77-80.  This is likely contributing to her not feeling well in general.  Unclear etiology of her worsening renal function.  However, she has been taking MiraLAX and she reports some loose stools.  Chest x-ray shows some interstitial edema and there is an elevated BUN.  Question a touch of heart failure likely related to atrial fibrillation with RVR.  Patient reports that she is urinating normally.  Will plan for admission to the hospitalist.  She is clinically stable.  CHA2DS2-VASc Score =    The patient's score is based upon:       ASSESSMENT AND PLAN: Paroxysmal Atrial Fibrillation (ICD10:  I48.0) The patient's CHA2DS2-VASc score is 5, indicating a 7.2% annual risk of stroke.   Secondary Hypercoagulable State (ICD10:  D68.69) The patient is at significant risk for stroke/thromboembolism based upon her CHA2DS2-VASc Score of 5.     Signed,  Merryl Hacker, MD    03/18/2021 2:31 AM    Final Clinical Impression(s) / ED Diagnose Final diagnoses:  Atrial fibrillation with RVR ([place])  Acute kidney injury ([place])  Edema, unspecified type    Rx / DC Orders ED Discharge Orders          Ordered    Amb referral to AFIB Clinic        03/17/21 2315             Mase Dhondt, Barbette Hair, MD 03/18/21 0230    Merryl Hacker, MD 03/18/21 [phone]

## 2021-03-18 ENCOUNTER — Inpatient Hospital Stay (HOSPITAL_COMMUNITY): Payer: Medicare Other

## 2021-03-18 DIAGNOSIS — J810 Acute pulmonary edema: Secondary | ICD-10-CM | POA: Diagnosis not present

## 2021-03-18 DIAGNOSIS — N184 Chronic kidney disease, stage 4 (severe): Secondary | ICD-10-CM | POA: Diagnosis not present

## 2021-03-18 DIAGNOSIS — I083 Combined rheumatic disorders of mitral, aortic and tricuspid valves: Secondary | ICD-10-CM | POA: Diagnosis not present

## 2021-03-18 DIAGNOSIS — R5383 Other fatigue: Secondary | ICD-10-CM | POA: Diagnosis not present

## 2021-03-18 DIAGNOSIS — E872 Acidosis: Secondary | ICD-10-CM | POA: Diagnosis not present

## 2021-03-18 DIAGNOSIS — I2510 Atherosclerotic heart disease of native coronary artery without angina pectoris: Secondary | ICD-10-CM | POA: Diagnosis not present

## 2021-03-18 DIAGNOSIS — I4891 Unspecified atrial fibrillation: Secondary | ICD-10-CM

## 2021-03-18 DIAGNOSIS — D649 Anemia, unspecified: Secondary | ICD-10-CM | POA: Diagnosis not present

## 2021-03-18 DIAGNOSIS — M810 Age-related osteoporosis without current pathological fracture: Secondary | ICD-10-CM | POA: Diagnosis present

## 2021-03-18 DIAGNOSIS — N289 Disorder of kidney and ureter, unspecified: Secondary | ICD-10-CM | POA: Diagnosis not present

## 2021-03-18 DIAGNOSIS — R0600 Dyspnea, unspecified: Secondary | ICD-10-CM | POA: Diagnosis not present

## 2021-03-18 DIAGNOSIS — I272 Pulmonary hypertension, unspecified: Secondary | ICD-10-CM | POA: Diagnosis present

## 2021-03-18 DIAGNOSIS — D631 Anemia in chronic kidney disease: Secondary | ICD-10-CM | POA: Diagnosis not present

## 2021-03-18 DIAGNOSIS — I517 Cardiomegaly: Secondary | ICD-10-CM | POA: Diagnosis not present

## 2021-03-18 DIAGNOSIS — N179 Acute kidney failure, unspecified: Secondary | ICD-10-CM | POA: Diagnosis not present

## 2021-03-18 DIAGNOSIS — R0602 Shortness of breath: Secondary | ICD-10-CM | POA: Diagnosis not present

## 2021-03-18 DIAGNOSIS — I371 Nonrheumatic pulmonary valve insufficiency: Secondary | ICD-10-CM | POA: Diagnosis not present

## 2021-03-18 DIAGNOSIS — E782 Mixed hyperlipidemia: Secondary | ICD-10-CM | POA: Diagnosis present

## 2021-03-18 DIAGNOSIS — I25118 Atherosclerotic heart disease of native coronary artery with other forms of angina pectoris: Secondary | ICD-10-CM | POA: Diagnosis present

## 2021-03-18 DIAGNOSIS — Z79899 Other long term (current) drug therapy: Secondary | ICD-10-CM | POA: Diagnosis not present

## 2021-03-18 DIAGNOSIS — I513 Intracardiac thrombosis, not elsewhere classified: Secondary | ICD-10-CM | POA: Diagnosis not present

## 2021-03-18 DIAGNOSIS — I509 Heart failure, unspecified: Secondary | ICD-10-CM | POA: Diagnosis not present

## 2021-03-18 DIAGNOSIS — Z803 Family history of malignant neoplasm of breast: Secondary | ICD-10-CM | POA: Diagnosis not present

## 2021-03-18 DIAGNOSIS — I4819 Other persistent atrial fibrillation: Secondary | ICD-10-CM | POA: Diagnosis not present

## 2021-03-18 DIAGNOSIS — I129 Hypertensive chronic kidney disease with stage 1 through stage 4 chronic kidney disease, or unspecified chronic kidney disease: Secondary | ICD-10-CM | POA: Diagnosis not present

## 2021-03-18 DIAGNOSIS — I340 Nonrheumatic mitral (valve) insufficiency: Secondary | ICD-10-CM | POA: Diagnosis not present

## 2021-03-18 DIAGNOSIS — J189 Pneumonia, unspecified organism: Secondary | ICD-10-CM | POA: Diagnosis not present

## 2021-03-18 DIAGNOSIS — R609 Edema, unspecified: Secondary | ICD-10-CM | POA: Diagnosis not present

## 2021-03-18 DIAGNOSIS — I5189 Other ill-defined heart diseases: Secondary | ICD-10-CM | POA: Diagnosis not present

## 2021-03-18 DIAGNOSIS — Z7982 Long term (current) use of aspirin: Secondary | ICD-10-CM | POA: Diagnosis not present

## 2021-03-18 DIAGNOSIS — J90 Pleural effusion, not elsewhere classified: Secondary | ICD-10-CM | POA: Diagnosis not present

## 2021-03-18 DIAGNOSIS — I130 Hypertensive heart and chronic kidney disease with heart failure and stage 1 through stage 4 chronic kidney disease, or unspecified chronic kidney disease: Secondary | ICD-10-CM | POA: Diagnosis not present

## 2021-03-18 DIAGNOSIS — Z0389 Encounter for observation for other suspected diseases and conditions ruled out: Secondary | ICD-10-CM | POA: Diagnosis not present

## 2021-03-18 DIAGNOSIS — I5031 Acute diastolic (congestive) heart failure: Secondary | ICD-10-CM | POA: Diagnosis not present

## 2021-03-18 DIAGNOSIS — N189 Chronic kidney disease, unspecified: Secondary | ICD-10-CM | POA: Diagnosis not present

## 2021-03-18 DIAGNOSIS — R778 Other specified abnormalities of plasma proteins: Secondary | ICD-10-CM | POA: Diagnosis not present

## 2021-03-18 DIAGNOSIS — J029 Acute pharyngitis, unspecified: Secondary | ICD-10-CM | POA: Diagnosis not present

## 2021-03-18 DIAGNOSIS — I5033 Acute on chronic diastolic (congestive) heart failure: Secondary | ICD-10-CM | POA: Diagnosis not present

## 2021-03-18 DIAGNOSIS — Z20822 Contact with and (suspected) exposure to covid-19: Secondary | ICD-10-CM | POA: Diagnosis not present

## 2021-03-18 DIAGNOSIS — I252 Old myocardial infarction: Secondary | ICD-10-CM | POA: Diagnosis not present

## 2021-03-18 DIAGNOSIS — J9811 Atelectasis: Secondary | ICD-10-CM | POA: Diagnosis not present

## 2021-03-18 DIAGNOSIS — J811 Chronic pulmonary edema: Secondary | ICD-10-CM | POA: Diagnosis not present

## 2021-03-18 DIAGNOSIS — Z888 Allergy status to other drugs, medicaments and biological substances status: Secondary | ICD-10-CM | POA: Diagnosis not present

## 2021-03-18 DIAGNOSIS — I10 Essential (primary) hypertension: Secondary | ICD-10-CM | POA: Diagnosis not present

## 2021-03-18 LAB — BASIC METABOLIC PANEL
Anion gap: 14 (ref 5–15)
Anion gap: 14 (ref 5–15)
BUN: 77 mg/dL — ABNORMAL HIGH (ref 8–23)
BUN: 80 mg/dL — ABNORMAL HIGH (ref 8–23)
CO2: 14 mmol/L — ABNORMAL LOW (ref 22–32)
CO2: 14 mmol/L — ABNORMAL LOW (ref 22–32)
Calcium: 8.6 mg/dL — ABNORMAL LOW (ref 8.9–10.3)
Calcium: 8.8 mg/dL — ABNORMAL LOW (ref 8.9–10.3)
Chloride: 107 mmol/L (ref 98–111)
Chloride: 107 mmol/L (ref 98–111)
Creatinine, Ser: 5.04 mg/dL — ABNORMAL HIGH (ref 0.44–1.00)
Creatinine, Ser: 5.05 mg/dL — ABNORMAL HIGH (ref 0.44–1.00)
GFR, Estimated: 7 mL/min — ABNORMAL LOW (ref >60)
GFR, Estimated: 7 mL/min — ABNORMAL LOW (ref >60)
Glucose, Bld: 118 mg/dL — ABNORMAL HIGH (ref 70–99)
Glucose, Bld: 122 mg/dL — ABNORMAL HIGH (ref 70–99)
Potassium: 4.3 mmol/L (ref 3.5–5.1)
Potassium: 4.6 mmol/L (ref 3.5–5.1)
Sodium: 135 mmol/L (ref 135–145)
Sodium: 135 mmol/L (ref 135–145)

## 2021-03-18 LAB — RENAL FUNCTION PANEL
Albumin: 3 g/dL — ABNORMAL LOW (ref 3.5–5.0)
Anion gap: 13 (ref 5–15)
BUN: 79 mg/dL — ABNORMAL HIGH (ref 8–23)
CO2: 17 mmol/L — ABNORMAL LOW (ref 22–32)
Calcium: 8.6 mg/dL — ABNORMAL LOW (ref 8.9–10.3)
Chloride: 106 mmol/L (ref 98–111)
Creatinine, Ser: 4.76 mg/dL — ABNORMAL HIGH (ref 0.44–1.00)
GFR, Estimated: 8 mL/min — ABNORMAL LOW (ref >60)
Glucose, Bld: 106 mg/dL — ABNORMAL HIGH (ref 70–99)
Phosphorus: 3.6 mg/dL (ref 2.5–4.6)
Potassium: 4.7 mmol/L (ref 3.5–5.1)
Sodium: 136 mmol/L (ref 135–145)

## 2021-03-18 LAB — IRON AND TIBC
Iron: 27 ug/dL — ABNORMAL LOW (ref 28–170)
Saturation Ratios: 13 % (ref 10.4–31.8)
TIBC: 214 ug/dL — ABNORMAL LOW (ref 250–450)
UIBC: 187 ug/dL

## 2021-03-18 LAB — HEPATIC FUNCTION PANEL
ALT: 37 U/L (ref 0–44)
AST: 28 U/L (ref 15–41)
Albumin: 2.9 g/dL — ABNORMAL LOW (ref 3.5–5.0)
Alkaline Phosphatase: 77 U/L (ref 38–126)
Bilirubin, Direct: 0.1 mg/dL (ref 0.0–0.2)
Indirect Bilirubin: 1.2 mg/dL — ABNORMAL HIGH (ref 0.3–0.9)
Total Bilirubin: 1.3 mg/dL — ABNORMAL HIGH (ref 0.3–1.2)
Total Protein: 5.3 g/dL — ABNORMAL LOW (ref 6.5–8.1)

## 2021-03-18 LAB — RESP PANEL BY RT-PCR (FLU A&B, COVID) ARPGX2
Influenza A by PCR: NEGATIVE
Influenza B by PCR: NEGATIVE
SARS Coronavirus 2 by RT PCR: NEGATIVE

## 2021-03-18 LAB — URINALYSIS, ROUTINE W REFLEX MICROSCOPIC
Bilirubin Urine: NEGATIVE
Glucose, UA: NEGATIVE mg/dL
Hgb urine dipstick: NEGATIVE
Ketones, ur: 5 mg/dL — AB
Leukocytes,Ua: NEGATIVE
Nitrite: NEGATIVE
Protein, ur: 100 mg/dL — AB
Specific Gravity, Urine: 1.018 (ref 1.005–1.030)
pH: 5 (ref 5.0–8.0)

## 2021-03-18 LAB — CBC
HCT: 32.6 % — ABNORMAL LOW (ref 36.0–46.0)
Hemoglobin: 10.4 g/dL — ABNORMAL LOW (ref 12.0–15.0)
MCH: 27.8 pg (ref 26.0–34.0)
MCHC: 31.9 g/dL (ref 30.0–36.0)
MCV: 87.2 fL (ref 80.0–100.0)
Platelets: 203 10*3/uL (ref 150–400)
RBC: 3.74 MIL/uL — ABNORMAL LOW (ref 3.87–5.11)
RDW: 15.7 % — ABNORMAL HIGH (ref 11.5–15.5)
WBC: 12.3 10*3/uL — ABNORMAL HIGH (ref 4.0–10.5)
nRBC: 0 % (ref 0.0–0.2)

## 2021-03-18 LAB — VITAMIN B12: Vitamin B-12: 770 pg/mL (ref 180–914)

## 2021-03-18 LAB — TROPONIN I (HIGH SENSITIVITY)
Troponin I (High Sensitivity): 51 ng/L — ABNORMAL HIGH (ref <18)
Troponin I (High Sensitivity): 53 ng/L — ABNORMAL HIGH (ref <18)

## 2021-03-18 LAB — BRAIN NATRIURETIC PEPTIDE: B Natriuretic Peptide: 815.8 pg/mL — ABNORMAL HIGH (ref 0.0–100.0)

## 2021-03-18 LAB — POC OCCULT BLOOD, ED: Fecal Occult Bld: NEGATIVE

## 2021-03-18 LAB — MAGNESIUM
Magnesium: 2 mg/dL (ref 1.7–2.4)
Magnesium: 2.2 mg/dL (ref 1.7–2.4)

## 2021-03-18 LAB — FERRITIN: Ferritin: 211 ng/mL (ref 11–307)

## 2021-03-18 LAB — FOLATE: Folate: 26.1 ng/mL (ref >5.9)

## 2021-03-18 LAB — TSH: TSH: 4.302 u[IU]/mL (ref 0.350–4.500)

## 2021-03-18 MED ORDER — DILTIAZEM LOAD VIA INFUSION
10.0000 mg | Freq: Once | INTRAVENOUS | Status: DC
  Filled 2021-03-18: qty 10

## 2021-03-18 MED ORDER — EZETIMIBE 10 MG PO TABS
10.0000 mg | ORAL_TABLET | Freq: Every day | ORAL | Status: DC
  Administered 2021-03-18 – 2021-03-30 (×13): 10 mg via ORAL
  Filled 2021-03-18 (×14): qty 1

## 2021-03-18 MED ORDER — POLYETHYLENE GLYCOL 3350 17 G PO PACK: 17.0000 g | PACK | Freq: Every day | ORAL | Status: DC | PRN

## 2021-03-18 MED ORDER — DILTIAZEM HCL-DEXTROSE 125-5 MG/125ML-% IV SOLN (PREMIX): 5.0000 mg/h | INTRAVENOUS | Status: DC

## 2021-03-18 MED ORDER — ADULT MULTIVITAMIN W/MINERALS CH
1.0000 | ORAL_TABLET | Freq: Every day | ORAL | Status: DC
  Administered 2021-03-18 – 2021-03-30 (×13): 1 via ORAL
  Filled 2021-03-18 (×13): qty 1

## 2021-03-18 MED ORDER — ATORVASTATIN CALCIUM 40 MG PO TABS
40.0000 mg | ORAL_TABLET | Freq: Every day | ORAL | Status: DC
  Administered 2021-03-18 – 2021-03-30 (×13): 40 mg via ORAL
  Filled 2021-03-18 (×13): qty 1

## 2021-03-18 MED ORDER — SODIUM CHLORIDE 0.9 % IV SOLN
INTRAVENOUS | Status: DC
  Administered 2021-03-18: 75 mL/h via INTRAVENOUS

## 2021-03-18 MED ORDER — HEPARIN SODIUM (PORCINE) 5000 UNIT/ML IJ SOLN
5000.0000 [IU] | Freq: Three times a day (TID) | INTRAMUSCULAR | Status: DC
  Administered 2021-03-18 – 2021-03-19 (×3): 5000 [IU] via SUBCUTANEOUS
  Filled 2021-03-18 (×2): qty 1

## 2021-03-18 MED ORDER — METOPROLOL TARTRATE 25 MG PO TABS
25.0000 mg | ORAL_TABLET | Freq: Two times a day (BID) | ORAL | Status: DC
  Administered 2021-03-18 – 2021-03-19 (×3): 25 mg via ORAL
  Filled 2021-03-18 (×4): qty 1

## 2021-03-18 MED ORDER — ASPIRIN EC 81 MG PO TBEC
81.0000 mg | DELAYED_RELEASE_TABLET | Freq: Every day | ORAL | Status: DC
  Administered 2021-03-18 – 2021-03-19 (×2): 81 mg via ORAL
  Filled 2021-03-18 (×2): qty 1

## 2021-03-18 MED ORDER — ACETAMINOPHEN 325 MG PO TABS
650.0000 mg | ORAL_TABLET | Freq: Four times a day (QID) | ORAL | Status: DC | PRN
  Administered 2021-03-30: 650 mg via ORAL
  Filled 2021-03-18: qty 2

## 2021-03-18 MED ORDER — METOPROLOL TARTRATE 25 MG PO TABS
50.0000 mg | ORAL_TABLET | Freq: Once | ORAL | Status: AC
  Administered 2021-03-18: 50 mg via ORAL
  Filled 2021-03-18: qty 2

## 2021-03-18 MED ORDER — MELATONIN 3 MG PO TABS: 3.0000 mg | ORAL_TABLET | Freq: Every evening | ORAL | Status: DC | PRN

## 2021-03-18 MED ORDER — METOPROLOL TARTRATE 5 MG/5ML IV SOLN
10.0000 mg | Freq: Once | INTRAVENOUS | Status: AC
  Administered 2021-03-18: 10 mg via INTRAVENOUS
  Filled 2021-03-18: qty 10

## 2021-03-18 MED ORDER — SODIUM CHLORIDE 0.9 % IV BOLUS
500.0000 mL | Freq: Once | INTRAVENOUS | Status: AC
  Administered 2021-03-18: 500 mL via INTRAVENOUS

## 2021-03-18 MED ORDER — SODIUM BICARBONATE 650 MG PO TABS
1300.0000 mg | ORAL_TABLET | Freq: Three times a day (TID) | ORAL | Status: AC
  Administered 2021-03-18 – 2021-03-19 (×4): 1300 mg via ORAL
  Filled 2021-03-18 (×7): qty 2

## 2021-03-18 MED ORDER — ENOXAPARIN SODIUM 30 MG/0.3ML IJ SOSY: 30.0000 mg | PREFILLED_SYRINGE | Freq: Every day | INTRAMUSCULAR | Status: DC

## 2021-03-18 MED ORDER — PROCHLORPERAZINE EDISYLATE 10 MG/2ML IJ SOLN
5.0000 mg | Freq: Four times a day (QID) | INTRAMUSCULAR | Status: DC | PRN
  Filled 2021-03-18: qty 1

## 2021-03-18 NOTE — Consult Note (Addendum)
Renal Service Consult Note [facility]  Heather Saunders 03/18/2021 Sol Blazing, MD Requesting Physician: Dr Cathlean Sauer  Reason for Consult: Renal failure HPI: The patient is a 87 y.o. year-old w/ hx of CKD, HTN, MI, HL who presented on 6/18 w/ general fatigue and was found in ED to have afib w/ RVR.  Was started on IV cardizem and amiodarone and given NS bolus for what was felt to be volume depletion. Creat was 5.0 on admit, b/l creat around 1.6- 1.9. Lisinopril was recently started by PCP, then stopped prior to admission. Asked to see for renal failure.   Pt seen in ED room, working w/ PT, states "it was easier walking this much at home as is it here". No acute SOB, no voiding issues.     ROS  denies CP  no joint pain   no HA  no blurry vision  no rash  no diarrhea  no nausea/ vomiting  no dysuria  no difficulty voiding  no change in urine color    Past Medical History  Past Medical History:  Diagnosis Date   Chronic kidney disease    Coronary disease    NonObstructive   History of nuclear stress test    Nuclear stress test 10/18: EF 96, normal perfusion, low risk   Hyperlipidemia    Hypertension    Myocardial infarct [facility]) 2006   Osteopenia    Osteoporosis    Past Surgical History  Past Surgical History:  Procedure Laterality Date   LIPOMA EXCISION     removed from Shoulder   Family History  Family History  Problem Relation Age of Onset   Cancer Mother 52       breast   Social History  reports that she has never smoked. She has never used smokeless tobacco. She reports current alcohol use of about 2.0 standard drinks of alcohol per week. She reports that she does not use drugs. Allergies  Allergies  Allergen Reactions   Boniva [Ibandronic Acid]     Sore throat   Fosamax [Alendronate Sodium]     Sore throat   Home medications Prior to Admission medications   Medication Sig Start Date End Date Taking? Authorizing Provider   acetaminophen (TYLENOL) 500 MG tablet Take 500 mg by mouth every 6 (six) hours as needed for mild pain.   Yes [provider]  amLODipine (NORVASC) 5 MG tablet TAKE 1 TABLET BY MOUTH TWICE A DAY Patient taking differently: Take 5 mg by mouth in the morning and at bedtime. 12/12/20  Yes Fay Records, MD  aspirin 81 MG tablet Take 81 mg by mouth daily.   Yes [provider]  atorvastatin (LIPITOR) 40 MG tablet Take 40 mg by mouth daily.   Yes [provider]  Cholecalciferol 100 MCG (4000 UT) CAPS Take 4,000 Units by mouth daily.   Yes [provider]  ezetimibe (ZETIA) 10 MG tablet TAKE 1 TABLET BY MOUTH EVERY DAY Patient taking differently: Take 10 mg by mouth daily. 07/31/20  Yes Fay Records, MD  hydrALAZINE (APRESOLINE) 25 MG tablet Take 25 mg by mouth in the morning, at noon, and at bedtime. With food   Yes [provider]  metoprolol tartrate (LOPRESSOR) 50 MG tablet Take 50 mg by mouth 2 (two) times daily. With food   Yes [provider]  Multiple Vitamin (MULTIVITAMIN) capsule Take 1 capsule by mouth daily.   Yes [provider]  polyethylene glycol (MIRALAX MIX-IN PAX) 17  g packet Take 17 g by mouth daily as needed for mild constipation.   Yes [provider]     Vitals:   03/18/21 0720 03/18/21 0745 03/18/21 0900 03/18/21 1015  BP:  130/81 138/89 (!) 141/85  Pulse:  92 97 (!) 103  Resp:  19 20 (!) 27  Temp: 97.8 F (36.6 C)     TempSrc: Oral     SpO2:  95% 96% 96%  Weight:      Height:       Exam Gen alert, no distress No rash, cyanosis or gangrene Sclera anicteric, throat clear  No jvd or bruits Chest clear bilat to bases, no rales/ wheezing RRR no MRG Abd soft ntnd no mass or ascites +bs GU deferred MS no joint effusions or deformity Ext no LE or UE edema, no wounds or ulcers Neuro is alert, Ox 3 , nf       Home meds:  - norvasc 5/ hydralazine 25 tid/ metoprolol 50 bid  - asa 81 / zetia  10/ lipitor 40 qd  - prn's/ vitamins/ supplements     uA prot 100  0-5 rbc, 0-5 wbc    Renal US  - 9-11 cm kidneys w/o hydro, bilat cortical thinning     Na 136  K 4.7  CO2 17  BUN 79 Cr 4.7  Ca 8.6, alb 3.0       WBC 12K  Hb 10.4       CXR - IMPRESSION: 1. Findings consistent with mild interstitial edema.            Date  Creat  eGFR     2017  1.53    2018   1.36  37     Nov 2020  1.64  29 Oct 2019  1.53  32         Aug 2021  2.00  23         Sept 2021  1.75- 1.90 24- 28, stage IV      June 18th, 2022 5.04  7       June 19  4.76  8   Assessment/ Plan: AKI on CKD 4 - b/l creat 1.7- 1.9 from late 2021, eGFR 24- 28. Pt here w/ gen fatigue and new onset afib w/ RVR. CXR showed pulm edema. Seen by cardiology suspect acute edema may be due to cardiac stress of new afib. Creat on admission up at 5.0.  Got NS bolus 500cc overnight and creat down today at 4.7. UOP not recorded possibly d/t boarding in ED. UA unremarkable and US shows some cortical thinning w/o obstruction. Not hypoxic, lungs are clear today. Will give gentle IVF"s at 75 cc/hr and f/u CXR and labs in am.  Afib w/ RVR - new, per pmd and cardiology Met acidosis - d/t advanced renal failure, started on po bicarb tabs Anemia - Hb 10.4, due to CKD in some part.  HTN - BP's stable, recently started acei was stopped prior to admit.  Fatigue - due to cardiac issues, +/- pulm edema. Repeat CXR in am.      [name]  MD 03/18/2021, 11:38 AM  Recent Labs  Lab 03/17/21 2315 03/18/21 0500  WBC 13.4* 12.3*  HGB 10.8* 10.4*   Recent Labs  Lab 03/18/21 0037 03/18/21 0500  K 4.6 4.7  BUN 77* 79*  CREATININE 5.05* 4.76*  CALCIUM 8.8* 8.6*  PHOS  --  3.6

## 2021-03-18 NOTE — ED Notes (Signed)
Attempted report x1. 

## 2021-03-18 NOTE — Progress Notes (Signed)
Pt arrived to 4E from the ED. CHG bath given. Connected to telemetry. VSS but presenting in a-fib. Oriented to room.

## 2021-03-18 NOTE — Progress Notes (Signed)
PROGRESS NOTE    Jasminerose Bittner  [id number] DOB: [birth year] DOA: 03/17/2021 PCP: Kathyrn Lass, MD    Brief Narrative:  Mrs. Smyth was admitted to the hospital with a working diagnosis of new onset atrial fibrillation with rapid ventricular response.  87 year old female past medical history for coronary artery disease, hypertension and dyslipidemia who presented with sudden onset of throat discomfort/chest pain.  Apparently not feeling well for about 4 weeks, gradually worsening.  When EMS arrived she was in atrial fibrillation with rapid ventricular response that prompted her to be brought to the hospital.  She was placed on diltiazem drip, blood pressure 128/85, heart rate 102, respiratory 12, oxygen saturation 96%.  Her lungs were clear to auscultation, heart S1-S2, present, irregularly irregular, abdomen soft, no lower extremity edema.  Sodium 135, potassium 4.3, chloride 107, bicarb 14, glucose 118, BUN 80, creatinine 5.0, anion gap 14, white count 13.4, hemoglobin 10.8, hematocrit 33.9, platelets 194. SARS COVID-19 negative.  Urinalysis specific gravity 1.018, 100 protein, 0-5 white cells.  Chest radiograph with increased insertional markings bilaterally.  EKG 133 bpm, normal axis, normal QTC, atrial fibrillation rhythm, no significant ST segment or T wave changes.  Assessment & Plan:   Principal Problem:   Atrial fibrillation with RVR (HCC) Active Problems:   Essential hypertension   Mixed hyperlipidemia   AKI (acute kidney injury) ([place])   Atrial fibrillation with rapid ventricular response, (new onset). Patient has been placed on diltiazem infusion with improvement in heart rate, but not back to baseline, continue to have HR 130 with movement.   Plan to continue rate control with diltiazem titrate for rate control, HR less than 130 bpm, then transition to oral AV blockade.  Anticoagulation with apixaban.   2. AKI on CKD IV with anion gap metabolic acidosis Renal  function with serum cr at 4.75 with baseline 2.0 (old records personally reviewed). K is 4,7 and serum bicarbonate at 17 with anion gap of 13.   To consider bicarb drip infusion at slow rate to control acidosis, continue hemodynamic support with atrial fibrillation rate control. Avoid hypotension and nephrotoxic medications.   Mild volume overload on chest film, continue close oxymetry monitoring and hemodynamic support to improved renal perfusion. Hold on diuretic therapy for now.   3. HTN/ dyslipidemia. Continue blood pressure monitoring, and rate control of atrial fibrillation.   Continue with statin therapy.   4. Anemia of chronic disease. Continue close follow up on Hgb and Hct.    Patient continue to be at high risk for worsening atrial fibrillation   Status is: Inpatient  Remains inpatient appropriate because:IV treatments appropriate due to intensity of illness or inability to take PO  Dispo:  Patient From: Home  Planned Disposition: Home  Medically stable for discharge: No         DVT prophylaxis: Heparin   Code Status:   full  Family Communication:  No family at the bedside     Consultants:  Cardiology  Nephrology   Subjective: Patient continue to have dyspnea with exertion, no nausea or vomiting, no chest pain.   Objective: Vitals:   03/18/21 1145 03/18/21 1230 03/18/21 1345 03/18/21 1500  BP: 130/83 136/83 128/83 138/90  Pulse: (!) 113 (!) 108 (!) 104 (!) 103  Resp: (!) 27 (!) 23 (!) 21 (!) 22  Temp:      TempSrc:      SpO2: 95% 95% 96% 97%  Weight:      Height:  Intake/Output Summary (Last 24 hours) at 03/18/2021 1530 Last data filed at 03/17/2021 2232 Gross per 24 hour  Intake 1000 ml  Output --  Net 1000 ml   Filed Weights   03/17/21 2237  Weight: 56.7 kg    Examination:   General: Not in pain or dyspnea  Neurology: Awake and alert, non focal  E ENT: mild pallor, no icterus, oral mucosa moist Cardiovascular: No JVD. S1-S2  present, tachycardic irregularly irregular no gallops, rubs, or murmurs. No lower extremity edema. Pulmonary: positive breath sounds bilaterally, with no wheezing, rhonchi or rales. Gastrointestinal. Abdomen soft and non tender Skin. No rashes Musculoskeletal: no joint deformities     Data Reviewed: I have personally reviewed following labs and imaging studies  CBC: Recent Labs  Lab 03/17/21 2315 03/18/21 0500  WBC 13.4* 12.3*  HGB 10.8* 10.4*  HCT 33.9* 32.6*  MCV 87.8 87.2  PLT 194 [id number]   Basic Metabolic Panel: Recent Labs  Lab 03/17/21 2315 03/18/21 0037 03/18/21 0500  NA 135 135 136  K 4.3 4.6 4.7  CL 107 107 106  CO2 14* 14* 17*  GLUCOSE 118* 122* 106*  BUN 80* 77* 79*  CREATININE 5.04* 5.05* 4.76*  CALCIUM 8.6* 8.8* 8.6*  MG 2.0  --  2.2  PHOS  --   --  3.6   GFR: Estimated Creatinine Clearance: 6.2 mL/min (A) (by C-G formula based on SCr of 4.76 mg/dL (H)). Liver Function Tests: Recent Labs  Lab 03/18/21 0500  ALBUMIN 3.0*   No results for input(s): LIPASE, AMYLASE in the last 168 hours. No results for input(s): AMMONIA in the last 168 hours. Coagulation Profile: No results for input(s): INR, PROTIME in the last 168 hours. Cardiac Enzymes: No results for input(s): CKTOTAL, CKMB, CKMBINDEX, TROPONINI in the last 168 hours. BNP (last 3 results) No results for input(s): PROBNP in the last 8760 hours. HbA1C: No results for input(s): HGBA1C in the last 72 hours. CBG: No results for input(s): GLUCAP in the last 168 hours. Lipid Profile: No results for input(s): CHOL, HDL, LDLCALC, TRIG, CHOLHDL, LDLDIRECT in the last 72 hours. Thyroid Function Tests: Recent Labs    03/18/21 0500  TSH 4.302   Anemia Panel: Recent Labs    03/18/21 0500  VITAMINB12 770  FOLATE 26.1  FERRITIN 211  TIBC 214*  IRON 27*      Radiology Studies: I have reviewed all of the imaging during this hospital visit personally     Scheduled Meds:  aspirin EC  81 mg  Oral Daily   atorvastatin  40 mg Oral Daily   ezetimibe  10 mg Oral Daily   heparin injection (subcutaneous)  5,000 Units Subcutaneous Q8H   metoprolol tartrate  25 mg Oral BID   multivitamin with minerals  1 tablet Oral Daily   sodium bicarbonate  1,300 mg Oral TID   Continuous Infusions:   LOS: 0 days        Faye Strohman Gerome Apley, MD

## 2021-03-18 NOTE — H&P (Addendum)
History and Physical  Heather Saunders [id number] DOB: 05/13/1925 DOA: 03/17/2021  Referring physician: Dr. Dina Rich, [place]. PCP: Kathyrn Lass, MD  Outpatient Specialists: Cardiology. Patient coming from: Home.    Chief Complaint: Not feeling well for the past month and worse for the past week.  HPI: Heather Saunders is a 87 y.o. female with medical history significant for coronary artery disease, essential hypertension, hyperlipidemia who presented to [facility] ED from home via EMS due to sudden onset throat discomfort and was concerned about possible anginal equivalent and called EMS.  Not feeling well for the past 1 month, gradually worsening for the past week.  Associated with uncontrolled BP for which she saw her PCP, was advised to stop taking Lisinopril, diffuse body aches, diffuse fatigue, and constipation.  Upon EMS arrival patient was in A. fib with RVR, she was brought into the ED for further evaluation.  In the ED she was started on Cardizem drip.  She was found to have AKI on CKD 3B.  Hypovolemic on exam she received judicious gentle IV fluid hydration, 500 cc normal saline due to pulmonary edema on chest x-ray.  She is asymptomatic from the A. fib RVR, denies any chest pain, palpitations, worsening dyspnea, or dizziness.  She reports seeing some blood on toilet tissue after having a bowel movement 3 weeks ago, per the patient, FOBT at PCPs was negative, no prior history of colonoscopy.  No falls.  TRH, hospitalist team, was asked to admit.    ED Course:  Temperature 97.8.  BP 128/85, pulse 102, respiration rate 12, O2 saturation 96% on room air.  Lab studies remarkable for COVID-19 screening test negative on 03/15/2021, serum glucose 122, BUN 77, creatinine 5.05, anion gap 14, serum bicarb 14, GFR 7.  WBC 13.4, hemoglobin 10.8, MCV 87, platelet count 194.  Review of Systems: Review of systems as noted in the HPI. All other systems reviewed and are negative.   Past Medical History:   Diagnosis Date   Chronic kidney disease    Coronary disease    NonObstructive   History of nuclear stress test    Nuclear stress test 10/18: EF 96, normal perfusion, low risk   Hyperlipidemia    Hypertension    Myocardial infarct [facility]) 2006   Osteopenia    Osteoporosis    Past Surgical History:  Procedure Laterality Date   LIPOMA EXCISION     removed from Shoulder    Social History:  reports that she has never smoked. She has never used smokeless tobacco. She reports current alcohol use of about 2.0 standard drinks of alcohol per week. She reports that she does not use drugs.   Allergies  Allergen Reactions   Boniva [Ibandronic Acid]     Sore throat   Fosamax [Alendronate Sodium]     Sore throat    Family History  Problem Relation Age of Onset   Cancer Mother 49       breast      Prior to Admission medications   Medication Sig Start Date End Date Taking? Authorizing Provider  acetaminophen (TYLENOL) 500 MG tablet Take 500 mg by mouth every 6 (six) hours as needed for mild pain.   Yes [provider]  amLODipine (NORVASC) 5 MG tablet TAKE 1 TABLET BY MOUTH TWICE A DAY Patient taking differently: Take 5 mg by mouth in the morning and at bedtime. 12/12/20  Yes Fay Records, MD  aspirin 81 MG tablet Take 81 mg by mouth daily.   Yes  [provider]  atorvastatin (LIPITOR) 40 MG tablet Take 40 mg by mouth daily.   Yes [provider]  Cholecalciferol 100 MCG (4000 UT) CAPS Take 4,000 Units by mouth daily.   Yes [provider]  ezetimibe (ZETIA) 10 MG tablet TAKE 1 TABLET BY MOUTH EVERY DAY Patient taking differently: Take 10 mg by mouth daily. 07/31/20  Yes Fay Records, MD  hydrALAZINE (APRESOLINE) 25 MG tablet Take 25 mg by mouth in the morning, at noon, and at bedtime. With food   Yes [provider]  metoprolol tartrate (LOPRESSOR) 50 MG tablet Take 50 mg by mouth 2 (two) times daily. With food   Yes [provider]   Multiple Vitamin (MULTIVITAMIN) capsule Take 1 capsule by mouth daily.   Yes [provider]  polyethylene glycol (MIRALAX MIX-IN PAX) 17 g packet Take 17 g by mouth daily as needed for mild constipation.   Yes [provider]    Physical Exam: BP 121/87   Pulse (!) 114   Temp 97.8 F (36.6 C) (Oral)   Resp (!) 31   Ht '5\' 4"'$  (1.626 m)   Wt 56.7 kg   SpO2 96%   BMI 21.46 kg/m   General: 87 y.o. year-old female well developed well nourished in no acute distress.  Alert and oriented x3. Cardiovascular: Regular rate and rhythm with no rubs or gallops.  No thyromegaly or JVD noted.  No lower extremity edema. 2/4 pulses in all 4 extremities. Respiratory: Clear to auscultation with no wheezes or rales. Good inspiratory effort. Abdomen: Soft nontender nondistended with normal bowel sounds x4 quadrants. Muskuloskeletal: No cyanosis, clubbing or edema noted bilaterally Neuro: CN II-XII intact, strength, sensation, reflexes Skin: No ulcerative lesions noted or rashes Psychiatry: Judgement and insight appear normal. Mood is appropriate for condition and setting          Labs on Admission:  Basic Metabolic Panel: Recent Labs  Lab 03/17/21 2315 03/18/21 0037  NA 135 135  K 4.3 4.6  CL 107 107  CO2 14* 14*  GLUCOSE 118* 122*  BUN 80* 77*  CREATININE 5.04* 5.05*  CALCIUM 8.6* 8.8*  MG 2.0  --    Liver Function Tests: No results for input(s): AST, ALT, ALKPHOS, BILITOT, PROT, ALBUMIN in the last 168 hours. No results for input(s): LIPASE, AMYLASE in the last 168 hours. No results for input(s): AMMONIA in the last 168 hours. CBC: Recent Labs  Lab 03/17/21 2315  WBC 13.4*  HGB 10.8*  HCT 33.9*  MCV 87.8  PLT 194   Cardiac Enzymes: No results for input(s): CKTOTAL, CKMB, CKMBINDEX, TROPONINI in the last 168 hours.  BNP (last 3 results) Recent Labs    03/18/21 0038  BNP 815.8*    ProBNP (last 3 results) No results for input(s): PROBNP in the last  8760 hours.  CBG: No results for input(s): GLUCAP in the last 168 hours.  Radiological Exams on Admission: DG Chest Port 1 View  Result Date: 03/17/2021 CLINICAL DATA:  Atrial fibrillation, sore throat EXAM: PORTABLE CHEST 1 VIEW COMPARISON:  10/06/2020 FINDINGS: Single frontal view of the chest demonstrates an enlarged cardiac silhouette. There is central vascular prominence, with mild diffuse interstitial opacities consistent with developing edema. No effusion or pneumothorax. IMPRESSION: 1. Findings consistent with mild interstitial edema. Electronically Signed   By: Randa Ngo M.D.   On: 03/17/2021 23:34    EKG: I independently viewed the EKG done and my findings are as followed: A. fib  with RVR rate of 130, nonspecific ST-T changes, QTC 483.  Assessment/Plan Present on Admission:  New onset a-fib [facility])  Active Problems:   New onset a-fib ([place])  Newly diagnosed new onset A. fib with RVR CHA2DS2-VASc score 5 Obtain TSH Obtain 2D echo Defer anticoagulation to cardiology, anemia work-up in progress. Cardiology consulted.  Dr. Renella Cunas, fellow, contacted via secure chat.  AKI on CKD 3B Baseline creatinine appears to be 2 with GFR 29 Presented with creatinine of greater than 5 with GFR 7 Avoid nephrotoxic agent, dehydration, hypotension. Continue to hold off home lisinopril. Obtain UA and renal ultrasound Closely monitor urine output Daily renal panel. Nephrology consulted, Dr. Jonnie Finner, will see in the AM.  Anion gap metabolic acidosis secondary to AKI Presented with serum bicarb of 14 and anion gap of 14 Obtain VBG to assess pH, follow results. Start isotonic bicarb tablets 1300 mg 3 times daily x4 doses. Repeat BMP in the morning  Pulmonary edema suspect secondary to acute on chronic diastolic CHF Presented with mild pulmonary edema, personally reviewed chest x-ray BNP>800 Not hypoxic, O2 saturation 96% on room air. Incentive spirometer Mobilize as  tolerated Strict I&O and daily weight  Elevated troponin, suspect demand ischemia in the setting of A. fib with RVR. Troponin peaked at 53 and downtrending. She denies any anginal symptoms No evidence of acute ischemia on 12 lead EKG. Follow 2D echo  Normocytic anemia, rule out acute blood loss anemia Presented with hemoglobin of 10.8 with baseline hemoglobin of 13 on January 2021. Obtain FOBT to rule out GI bleed prior to starting anticoagulation Obtain iron studies  Essential hypertension BP stable Resume home regimen. Her home lisinopril was discontinued by her PCP, continue to hold off.  Hyperlipidemia Resume home statin  Generalized weakness suspect multifactorial secondary to uremia from AKI vs cardiac related with A. fib RVR PT OT evaluation Fall precautions Mobilize as tolerated with fall precautions.   DVT prophylaxis: Subcu heparin 3 times daily  Code Status: Full code  Family Communication: None at baseline  Disposition Plan: Admit to telemetry medical.  Consults called: Cardiology, nephrology.  Admission status: Inpatient status, patient will require at least 2 midnights for further evaluation and treatment of present condition   Status is: Inpatient    Dispo:  Patient From: Home  Planned Disposition: Home possibly on 03/20/2020 or when cardiology and nephrology sign off.  Medically stable for discharge: No         Kayleen Memos MD [facility] Pager [phone]  If 7PM-7AM, please contact night-coverage [email or web address] Password [place]  03/18/2021, 2:34 AM

## 2021-03-18 NOTE — Consult Note (Signed)
Cardiology Consultation:   Patient ID: Heather Saunders MRN: [id number]; DOB: [birth year]  Admit date: 03/17/2021 Date of Consult: 03/18/2021  Primary Care Provider: Kathyrn Lass, MD [facility] Cardiologist: Dorris Carnes, MD  [place] Electrophysiologist:  None   Patient Profile:   Heather Saunders is a 87 y.o. female with CKD, CAD, HLD, HTN, and osteoporosis who presents with generalized fatigue, throat pain and tachycardia who was found to have new onset AF/RVR.  History of Present Illness:   Heather Saunders reports that yesterday evening she felt like she had sudden onset throat discomfort that felt like a sore throat but also felt generalized fatigue that was concerning so she called EMS.  She feels that she has had worsening malaise over the past several weeks and has been following with [facility] for management of her blood pressure.  She has CKD 3 at baseline with a sCr (2-3) over the past few months.  She was started on lisinopril at least a month ago for better control of her blood pressure but felt she was having fatigue, constipation, and body aches so this was discontinued.    On EMS arrival she was found to have AF/RVR and was started on a Cardizem drip.  She was hypovolemic on admission and was given 500 cc saline however there was some mild pulmonary edema on CXR.  Her labs revealed a significant AKI with sCr (5.0) from baseline around (1.2 in 12/2020) which started to increase to 2.0-3.5 over the past 6 weeks.  She denies any palpitations, presyncope, or syncopal episodes.  She has had some shortness of breath with sleeping at night still only sleeps on 1 pillow and denies any exertional dyspnea or lower extremity swelling.  If anything she has lost several pounds over the past month unintentionally.  Past Medical History:  Diagnosis Date   Chronic kidney disease    Coronary disease    NonObstructive   History of nuclear stress test    Nuclear stress test 10/18:  EF 96, normal perfusion, low risk   Hyperlipidemia    Hypertension    Myocardial infarct [facility] 2006   Osteopenia    Osteoporosis    Past Surgical History:  Procedure Laterality Date   LIPOMA EXCISION     removed from Shoulder    Home Medications:  Prior to Admission medications   Medication Sig Start Date End Date Taking? Authorizing Provider  acetaminophen (TYLENOL) 500 MG tablet Take 500 mg by mouth every 6 (six) hours as needed for mild pain.   Yes [provider]  amLODipine (NORVASC) 5 MG tablet TAKE 1 TABLET BY MOUTH TWICE A DAY Patient taking differently: Take 5 mg by mouth in the morning and at bedtime. 12/12/20  Yes Fay Records, MD  aspirin 81 MG tablet Take 81 mg by mouth daily.   Yes [provider]  atorvastatin (LIPITOR) 40 MG tablet Take 40 mg by mouth daily.   Yes [provider]  Cholecalciferol 100 MCG (4000 UT) CAPS Take 4,000 Units by mouth daily.   Yes [provider]  ezetimibe (ZETIA) 10 MG tablet TAKE 1 TABLET BY MOUTH EVERY DAY Patient taking differently: Take 10 mg by mouth daily. 07/31/20  Yes Fay Records, MD  hydrALAZINE (APRESOLINE) 25 MG tablet Take 25 mg by mouth in the morning, at noon, and at bedtime. With food   Yes [provider]  metoprolol tartrate (LOPRESSOR) 50 MG tablet Take 50 mg by mouth 2 (two) times daily.  With food   Yes [provider]  Multiple Vitamin (MULTIVITAMIN) capsule Take 1 capsule by mouth daily.   Yes [provider]  polyethylene glycol (MIRALAX MIX-IN PAX) 17 g packet Take 17 g by mouth daily as needed for mild constipation.   Yes [provider]    Inpatient Medications: Scheduled Meds:  aspirin EC  81 mg Oral Daily   atorvastatin  40 mg Oral Daily   ezetimibe  10 mg Oral Daily   heparin injection (subcutaneous)  5,000 Units Subcutaneous Q8H   metoprolol tartrate  25 mg Oral BID   multivitamin with minerals  1 tablet Oral Daily   sodium bicarbonate   1,300 mg Oral TID   Continuous Infusions:  PRN Meds: acetaminophen, melatonin, polyethylene glycol, prochlorperazine  Allergies:    Allergies  Allergen Reactions   Boniva [Ibandronic Acid]     Sore throat   Fosamax [Alendronate Sodium]     Sore throat    Social History:   Social History   Socioeconomic History   Marital status: Divorced    Spouse name: Not on file   Number of children: Not on file   Years of education: Not on file   Highest education level: Not on file  Occupational History   Not on file  Tobacco Use   Smoking status: Never   Smokeless tobacco: Never  Vaping Use   Vaping Use: Never used  Substance and Sexual Activity   Alcohol use: Yes    Alcohol/week: 2.0 standard drinks    Types: 2 Glasses of wine per week   Drug use: No   Sexual activity: Not on file  Other Topics Concern   Not on file  Social History Narrative   Not on file   Social Determinants of Health   Financial Resource Strain: Not on file  Food Insecurity: Not on file  Transportation Needs: Not on file  Physical Activity: Not on file  Stress: Not on file  Social Connections: Not on file  Intimate Partner Violence: Not on file    Family History:   Family History  Problem Relation Age of Onset   Cancer Mother 59       breast     ROS:  Review of Systems: [y] = yes, '[ ]'  = no      General: Weight gain '[ ]' ; Weight loss [y]; Anorexia '[ ]' ; Fatigue '[ ]' ; Fever '[ ]' ; Chills '[ ]' ; Weakness '[ ]'    Cardiac: Chest pain/pressure '[ ]' ; Resting SOB '[ ]' ; Exertional SOB '[ ]' ; Orthopnea [y]; Pedal Edema '[ ]' ; Palpitations '[ ]' ; Syncope '[ ]' ; Presyncope '[ ]' ; Paroxysmal nocturnal dyspnea '[ ]' , throat pain [y]   Pulmonary: Cough '[ ]' ; Wheezing '[ ]' ; Hemoptysis '[ ]' ; Sputum '[ ]' ; Snoring '[ ]'    GI: Vomiting '[ ]' ; Dysphagia '[ ]' ; Melena '[ ]' ; Hematochezia '[ ]' ; Heartburn '[ ]' ; Abdominal pain '[ ]' ; Constipation '[ ]' ; Diarrhea '[ ]' ; BRBPR '[ ]'    GU: Hematuria '[ ]' ; Dysuria '[ ]' ; Nocturia '[ ]'  Vascular: Pain in legs with  walking '[ ]' ; Pain in feet with lying flat '[ ]' ; Non-healing sores '[ ]' ; Stroke '[ ]' ; TIA '[ ]' ; Slurred speech '[ ]' ;   Neuro: Headaches '[ ]' ; Vertigo '[ ]' ; Seizures '[ ]' ; Paresthesias '[ ]' ;Blurred vision '[ ]' ; Diplopia '[ ]' ; Vision changes '[ ]'    Ortho/Skin: Arthritis '[ ]' ; Joint pain '[ ]' ; Muscle pain '[ ]' ; Joint swelling '[ ]' ; Back Pain '[ ]' ; Rash '[ ]'   Psych: Depression '[ ]' ; Anxiety '[ ]'    Heme: Bleeding problems '[ ]' ; Clotting disorders '[ ]' ; Anemia '[ ]'    Endocrine: Diabetes '[ ]' ; Thyroid dysfunction '[ ]'    Physical Exam/Data:   Vitals:   03/18/21 0630 03/18/21 0700 03/18/21 0715 03/18/21 0720  BP: (!) 154/84 135/69 (!) 134/92   Pulse: (!) 105 85 97   Resp: (!) '23 18 20   ' Temp:    97.8 F (36.6 C)  TempSrc:    Oral  SpO2: 95% 96% 95%   Weight:      Height:        Intake/Output Summary (Last 24 hours) at 03/18/2021 0755 Last data filed at 03/17/2021 2232 Gross per 24 hour  Intake 1000 ml  Output --  Net 1000 ml   Last 3 Weights 03/17/2021 05/19/2020 10/22/2019  Weight (lbs) 125 lb 125 lb 3.2 oz 129 lb 1.9 oz  Weight (kg) 56.7 kg 56.79 kg 58.568 kg     Body mass index is 21.46 kg/m.  General:  Well nourished, well developed, in no acute distress HEENT: normal Lymph: no adenopathy Neck: no JVD Endocrine:  No thryomegaly Vascular: No carotid bruits; FA pulses 2+ bilaterally without bruits  Cardiac:  normal S1, S2; RRR; no murmur  Lungs: mild basilar crackles b/l Abd: soft, nontender, no hepatomegaly  Ext: no edema Musculoskeletal:  No deformities, BUE and BLE strength normal and equal Skin: warm and dry  Neuro:  CNs 2-12 intact, no focal abnormalities noted Psych:  Normal affect   EKG:  The EKG was personally reviewed and demonstrates: AF/RVR (HR 130), no ischemic changes  Relevant CV Studies:  TTE Result date: 10/30/20  1. Left ventricular ejection fraction, by estimation, is 60 to 65%. The  left ventricle has normal function. The left ventricle has no regional  wall motion  abnormalities. There is severe left ventricular hypertrophy.  Left ventricular diastolic parameters   are consistent with Grade II diastolic dysfunction (pseudonormalization).  Elevated left ventricular end-diastolic pressure.   2. Right ventricular systolic function is normal. The right ventricular  size is normal.   3. The mitral valve is abnormal. Mild mitral valve regurgitation. No  evidence of mitral stenosis.   4. The aortic valve is tricuspid. There is moderate calcification of the  aortic valve. There is moderate thickening of the aortic valve. Aortic  valve regurgitation is mild. Mild to moderate aortic valve  sclerosis/calcification is present, without any  evidence of aortic stenosis.   5. The inferior vena cava is normal in size with greater than 50%  respiratory variability, suggesting right atrial pressure of 3 mmHg.   Laboratory Data:  High Sensitivity Troponin:   Recent Labs  Lab 03/17/21 2333 03/18/21 0037  TROPONINIHS 53* 51*     Chemistry Recent Labs  Lab 03/17/21 2315 03/18/21 0037 03/18/21 0500  NA 135 135 136  K 4.3 4.6 4.7  CL 107 107 106  CO2 14* 14* 17*  GLUCOSE 118* 122* 106*  BUN 80* 77* 79*  CREATININE 5.04* 5.05* 4.76*  CALCIUM 8.6* 8.8* 8.6*  GFRNONAA 7* 7* 8*  ANIONGAP '14 14 13    ' Recent Labs  Lab 03/18/21 0500  ALBUMIN 3.0*   Hematology Recent Labs  Lab 03/17/21 2315 03/18/21 0500  WBC 13.4* 12.3*  RBC 3.86* 3.74*  HGB 10.8* 10.4*  HCT 33.9* 32.6*  MCV 87.8 87.2  MCH 28.0 27.8  MCHC 31.9 31.9  RDW 15.6* 15.7*  PLT 194 203   BNP Recent  Labs  Lab 04-13-2021 0038  BNP 815.8*    DDimer No results for input(s): DDIMER in the last 168 hours.  Radiology/Studies:  US RENAL  Result Date: 2021/04/13 CLINICAL DATA:  87 year old female with acute renal insufficiency. EXAM: RENAL / URINARY TRACT ULTRASOUND COMPLETE COMPARISON:  CT Abdomen and Pelvis 10/10/2020. FINDINGS: Right Kidney: Renal measurements: 9.5 x 3.4 x 4.6 cm =  volume: 78 mL. Renal cortical thinning and mildly echogenic renal parenchyma (images 7 and 8). No right hydronephrosis or renal mass, lesion. Left Kidney: Renal measurements: 10.6 x 3.9 x 4.6 cm = volume: 98 mL. Similar renal cortical thinning and echogenicity to the right side. Small benign exophytic lower pole cyst measures 14 mm and appears inconsequential (image 41). No left hydronephrosis or renal mass. Bladder: Appears normal for degree of bladder distention. Other: Uterus partially visible, grossly stable and within normal limits compared to January. IMPRESSION: Some evidence of chronic medical renal disease.  Otherwise negative. Electronically Signed   By: Genevie Ann M.D.   On: 04/13/2021 05:11   DG Chest Port 1 View  Result Date: 03/17/2021 CLINICAL DATA:  Atrial fibrillation, sore throat EXAM: PORTABLE CHEST 1 VIEW COMPARISON:  10/06/2020 FINDINGS: Single frontal view of the chest demonstrates an enlarged cardiac silhouette. There is central vascular prominence, with mild diffuse interstitial opacities consistent with developing edema. No effusion or pneumothorax. IMPRESSION: 1. Findings consistent with mild interstitial edema. Electronically Signed   By: Randa Ngo M.D.   On: 03/17/2021 23:34    Assessment and Plan:   AF/RVR Ms. Leaming presents with symptomatic AF/RVR which she has not experienced before and AKI on CKD.  Cardiology is consulted regarding her AF.  She is still fairly functional and seems to not tolerate AF/RVR very well.  Although she has mild pulmonary edema I think her elevated BNP and mild elevated troponin a reflection of cardiac stress related to her atrial fibrillation.  She does have distant past CAD that was nonobstructive.  She had an echo in January that fortunately had preserved EF however she did have grade 2 diastolic dysfunction and severe LVH. Her CKD has continued to worsen over the past 2 months and is markedly elevated today.  I am not sure what the specific  trigger was for her significant AKI.  It sounds like a lot of her symptoms started around the time lisinopril was initiated however I would not think that this alone would explain her significant AKI.  Bilateral renal ultrasound did not show significant hydronephrosis that would explain her AKI.  With regards to her atrial fibrillation I think it is reasonable to go ahead and heparinize her without bolus (C2V 5). She has a worsening anemia (10.4) from (11.8) a week ago and 12-13 a year ago. Multiple myeloma would be a consideration given sCr elevation and anemia although Ca is low today which would go against this.  Her TSH is mildly elevated and I added on LFTs so that we can consider starting amiodarone load prior to TEE/DCCV.  Given that this is her first AF occurrence that we have seen I think it would be ideal to restore sinus rhythm if possible.  Given her relative anemia I would not start her on a DOAC yet (along with her acutely reduced eGFR) until her anemia is stable on heparin and her creatinine stabilizes as well.  She has an elevated CHA2DS2-VASc and needs to be heparinized before a cardioversion indefinitely thereafter. - amio load IV today then transition to PO  -  repeat TTE today  - if anemia is stable while on heparin then can consider TEE/DCCV tomorrow pending availability   For questions or updates, please contact [place] Please consult [email or web address] for contact info under   Signed, Dion Body, MD  03/18/2021 7:55 AM

## 2021-03-18 NOTE — Evaluation (Signed)
Physical Therapy Evaluation Patient Details Name: Heather Saunders MRN: [id number] DOB: 08-07-1925 Today's Date: 03/18/2021   History of Present Illness  Pt is a 87 y/o female presenting on 6/18 to ED for sudden onset of throat discomfrot and concern for possible anginal equivalent. Pt found to have AKI on CKD 3B. Pt with new onset a fib with RVR. CXR shows pulmonary edema. PMH: coronary artery disease, essential hypertension, hyperlipidemia.  Clinical Impression  Pt demonstrates generalized weakness and deficits in balance, activity tolerance, and gait. Pt performs bed mobility and transfers without the need for physical assistance. Pt tolerates ambulation for very limited household distances with improved stability with use of RW, compared to ambulation without use of an AD. Further mobility deferred secondary to pt's HR up to 150s this session. Pt will benefit from acute PT to improve stability with gait and independence in mobility. SPT recommends SNF placement to provide physical assistance needs and aid in return to independence in functional mobility.     Follow Up Recommendations SNF;Supervision for mobility/OOB    Equipment Recommendations  Rolling walker with 5" wheels;3in1 (PT)    Recommendations for Other Services       Precautions / Restrictions Precautions Precautions: Fall Restrictions Weight Bearing Restrictions: No      Mobility  Bed Mobility Overal bed mobility: Needs Assistance Bed Mobility: Supine to Sit     Supine to sit: Supervision     General bed mobility comments: supervision for safety    Transfers Overall transfer level: Needs assistance Equipment used: None;Rolling walker (2 wheeled) Transfers: Sit to/from Stand Sit to Stand: Supervision         General transfer comment: supervision for safety. No AD initally, RW for second sit>stand transfer.  Ambulation/Gait Ambulation/Gait assistance: Min guard Gait Distance (Feet): 15 Feet Assistive  device: None;Rolling walker (2 wheeled) Gait Pattern/deviations: Step-through pattern;Step-to pattern;Decreased stride length Gait velocity: reduced Gait velocity interpretation: <1.31 ft/sec, indicative of household ambulator General Gait Details: Ambulation initally performed without use of AD and pt demonstrating mild instability. Improved stability noted with use of RW. Ambulation distance and evaluation limited secondary to pt HR reaching 150s.  Stairs            Wheelchair Mobility    Modified Rankin (Stroke Patients Only)       Balance Overall balance assessment: Needs assistance Sitting-balance support: Feet supported Sitting balance-Leahy Scale: Fair     Standing balance support: During functional activity Standing balance-Leahy Scale: Fair Standing balance comment: Pt demonstrates mild instability with standing balance.                             Pertinent Vitals/Pain Pain Assessment: No/denies pain    Home Living Family/patient expects to be discharged to:: Private residence Living Arrangements: Alone Available Help at Discharge: Friend(s);Available PRN/intermittently Type of Home: Apartment Home Access: Level entry     Home Layout: One level Home Equipment: None      Prior Function Level of Independence: Independent         Comments: Pt independent at baseline without use of an AD, drives, grocery shops, and performs ADLs independently.     Hand Dominance        Extremity/Trunk Assessment   Upper Extremity Assessment Upper Extremity Assessment: Generalized weakness    Lower Extremity Assessment Lower Extremity Assessment: Generalized weakness       Communication   Communication: HOH  Cognition Arousal/Alertness: Awake/alert Behavior During Therapy: [facility]  for tasks assessed/performed Overall Cognitive Status: Within Functional Limits for tasks assessed                                        General  Comments General comments (skin integrity, edema, etc.): Pt's HR intially between 99-121 at rest. Pt HR up to 150s with mobility. Further mobility deferred at this time    Exercises     Assessment/Plan    PT Assessment Patient needs continued PT services  PT Problem List Decreased strength;Decreased activity tolerance;Decreased balance;Decreased mobility;Decreased knowledge of use of DME;Decreased safety awareness;Decreased knowledge of precautions;Cardiopulmonary status limiting activity       PT Treatment Interventions DME instruction;Gait training;Functional mobility training;Therapeutic activities;Therapeutic exercise;Balance training;Patient/family education    PT Goals (Current goals can be found in the Care Plan section)  Acute Rehab PT Goals Patient Stated Goal: Return to baseline and go home PT Goal Formulation: With patient Time For Goal Achievement: 04/01/21 Potential to Achieve Goals: Good    Frequency Min 2X/week   Barriers to discharge Decreased caregiver support Pt reports friends may be able to help as needed when discharged, pt later reports most of her friends use an AD for mobility.    Co-evaluation               AM-PAC PT "6 Clicks" Mobility  Outcome Measure Help needed turning from your back to your side while in a flat bed without using bedrails?: A Little Help needed moving from lying on your back to sitting on the side of a flat bed without using bedrails?: A Little Help needed moving to and from a bed to a chair (including a wheelchair)?: A Little Help needed standing up from a chair using your arms (e.g., wheelchair or bedside chair)?: A Little Help needed to walk in hospital room?: A Lot Help needed climbing 3-5 steps with a railing? : A Lot 6 Click Score: 16    End of Session Equipment Utilized During Treatment: Gait belt Activity Tolerance: Treatment limited secondary to medical complications (Comment) (tachycardia) Patient left: in  bed;with call bell/phone within reach Nurse Communication: Mobility status PT Visit Diagnosis: Unsteadiness on feet (R26.81);Other abnormalities of gait and mobility (R26.89);Muscle weakness (generalized) (M62.81);Difficulty in walking, not elsewhere classified (R26.2)    Time: [id number] PT Time Calculation (min) (ACUTE ONLY): 22 min   Charges:   PT Evaluation $PT Eval Low Complexity: 1 Low          Acute Rehab  Pager: [phone]   Garwin Brothers, SPT  03/18/2021, 4:51 PM

## 2021-03-19 ENCOUNTER — Inpatient Hospital Stay (HOSPITAL_COMMUNITY): Payer: Medicare Other

## 2021-03-19 DIAGNOSIS — N179 Acute kidney failure, unspecified: Secondary | ICD-10-CM

## 2021-03-19 DIAGNOSIS — I2510 Atherosclerotic heart disease of native coronary artery without angina pectoris: Secondary | ICD-10-CM | POA: Diagnosis not present

## 2021-03-19 DIAGNOSIS — R778 Other specified abnormalities of plasma proteins: Secondary | ICD-10-CM | POA: Diagnosis not present

## 2021-03-19 DIAGNOSIS — I4891 Unspecified atrial fibrillation: Secondary | ICD-10-CM

## 2021-03-19 DIAGNOSIS — I10 Essential (primary) hypertension: Secondary | ICD-10-CM | POA: Diagnosis not present

## 2021-03-19 LAB — RENAL FUNCTION PANEL
Albumin: 2.8 g/dL — ABNORMAL LOW (ref 3.5–5.0)
Anion gap: 13 (ref 5–15)
BUN: 77 mg/dL — ABNORMAL HIGH (ref 8–23)
CO2: 17 mmol/L — ABNORMAL LOW (ref 22–32)
Calcium: 8.5 mg/dL — ABNORMAL LOW (ref 8.9–10.3)
Chloride: 107 mmol/L (ref 98–111)
Creatinine, Ser: 3.86 mg/dL — ABNORMAL HIGH (ref 0.44–1.00)
GFR, Estimated: 10 mL/min — ABNORMAL LOW (ref >60)
Glucose, Bld: 120 mg/dL — ABNORMAL HIGH (ref 70–99)
Phosphorus: 2.9 mg/dL (ref 2.5–4.6)
Potassium: 4.3 mmol/L (ref 3.5–5.1)
Sodium: 137 mmol/L (ref 135–145)

## 2021-03-19 LAB — CBC
HCT: 34.6 % — ABNORMAL LOW (ref 36.0–46.0)
Hemoglobin: 11.3 g/dL — ABNORMAL LOW (ref 12.0–15.0)
MCH: 28.1 pg (ref 26.0–34.0)
MCHC: 32.7 g/dL (ref 30.0–36.0)
MCV: 86.1 fL (ref 80.0–100.0)
Platelets: 220 10*3/uL (ref 150–400)
RBC: 4.02 MIL/uL (ref 3.87–5.11)
RDW: 15.6 % — ABNORMAL HIGH (ref 11.5–15.5)
WBC: 13.7 10*3/uL — ABNORMAL HIGH (ref 4.0–10.5)
nRBC: 0 % (ref 0.0–0.2)

## 2021-03-19 LAB — ECHOCARDIOGRAM COMPLETE
AR max vel: 3.63 cm2
AV Area VTI: 4.19 cm2
AV Area mean vel: 3.4 cm2
AV Mean grad: 3 mmHg
AV Peak grad: 4.9 mmHg
Ao pk vel: 1.11 m/s
Height: 64 in
P 1/2 time: 676 msec
S' Lateral: 2.4 cm
Weight: 1869.5 oz

## 2021-03-19 LAB — SODIUM, URINE, RANDOM: Sodium, Ur: 40 mmol/L

## 2021-03-19 LAB — PROTIME-INR
INR: 1.1 (ref 0.8–1.2)
Prothrombin Time: 14 seconds (ref 11.4–15.2)

## 2021-03-19 LAB — CREATININE, URINE, RANDOM: Creatinine, Urine: 163.78 mg/dL

## 2021-03-19 LAB — HEPARIN LEVEL (UNFRACTIONATED): Heparin Unfractionated: 0.48 IU/mL (ref 0.30–0.70)

## 2021-03-19 MED ORDER — METOPROLOL TARTRATE 25 MG PO TABS
25.0000 mg | ORAL_TABLET | Freq: Once | ORAL | Status: AC
  Administered 2021-03-19: 25 mg via ORAL
  Filled 2021-03-19: qty 1

## 2021-03-19 MED ORDER — HEPARIN (PORCINE) 25000 UT/250ML-% IV SOLN
800.0000 [IU]/h | INTRAVENOUS | Status: DC
  Administered 2021-03-19 – 2021-03-20 (×2): 800 [IU]/h via INTRAVENOUS
  Filled 2021-03-19 (×2): qty 250

## 2021-03-19 MED ORDER — SODIUM CHLORIDE 0.9 % IV SOLN: INTRAVENOUS | Status: DC

## 2021-03-19 MED ORDER — METOPROLOL TARTRATE 5 MG/5ML IV SOLN
5.0000 mg | INTRAVENOUS | Status: DC | PRN
  Administered 2021-03-19: 5 mg via INTRAVENOUS
  Filled 2021-03-19: qty 5

## 2021-03-19 MED ORDER — METOPROLOL TARTRATE 50 MG PO TABS
50.0000 mg | ORAL_TABLET | Freq: Two times a day (BID) | ORAL | Status: DC
  Administered 2021-03-19 – 2021-03-20 (×2): 50 mg via ORAL
  Filled 2021-03-19 (×2): qty 1

## 2021-03-19 NOTE — Anesthesia Preprocedure Evaluation (Addendum)
Anesthesia Evaluation  Patient identified by MRN, date of birth, ID band Patient awake    Reviewed: Allergy & Precautions, H&P , NPO status , Patient's Chart, lab work & pertinent test results  Airway Mallampati: II  TM Distance: >3 FB Neck ROM: Full    Dental no notable dental hx. (+) Partial Upper, Partial Lower, Poor Dentition, Missing, Dental Advisory Given   Pulmonary neg pulmonary ROS,    Pulmonary exam normal breath sounds clear to auscultation       Cardiovascular Exercise Tolerance: Good hypertension, Pt. on medications + CAD and + Past MI  Normal cardiovascular exam+ dysrhythmias Atrial Fibrillation  Rhythm:Regular Rate:Normal  ECHO 6/22 1. Left ventricular ejection fraction, by estimation, is 60 to 65%. The  left ventricle has normal function. The left ventricle has no regional  wall motion abnormalities. There is severe left ventricular hypertrophy.  Left ventricular diastolic function  could not be evaluated.  2. Right ventricular systolic function is normal. The right ventricular  size is normal. There is severely elevated pulmonary artery systolic  pressure. The estimated right ventricular systolic pressure is [id number] mmHg.  3. Left atrial size was moderately dilated.  4. Mobile filamentous structure, which appears attached to the  interatrial septum - consider thrombus or possible myxoma (image 67/68) -  measuring ~0.6 x 2.0 cm. Right atrial size was mildly dilated.  5. The mitral valve is abnormal. Mild mitral valve regurgitation.  6. The aortic valve is tricuspid. Aortic valve regurgitation is mild.  Mild aortic valve sclerosis is present, with no evidence of aortic valve  stenosis.  7. The inferior vena cava is dilated in size with >50% respiratory  variability, suggesting right atrial pressure of 8 mmHg.    Neuro/Psych negative neurological ROS  negative psych ROS   GI/Hepatic negative GI ROS, Neg  liver ROS,   Endo/Other  negative endocrine ROS  Renal/GU ARF and CRFRenal disease  negative genitourinary   Musculoskeletal negative musculoskeletal ROS (+)   Abdominal   Peds negative pediatric ROS (+)  Hematology  (+) Blood dyscrasia, anemia ,   Anesthesia Other Findings   Reproductive/Obstetrics negative OB ROS                            Anesthesia Physical Anesthesia Plan  ASA: 4  Anesthesia Plan: General   Post-op Pain Management:    Induction: Intravenous  PONV Risk Score and Plan: 3 and Treatment may vary due to age or medical condition  Airway Management Planned: Natural Airway, Simple Face Mask and Mask  Additional Equipment: None  Intra-op Plan:   Post-operative Plan:   Informed Consent: I have reviewed the patients History and Physical, chart, labs and discussed the procedure including the risks, benefits and alternatives for the proposed anesthesia with the patient or authorized representative who has indicated his/her understanding and acceptance.       Plan Discussed with: Anesthesiologist and CRNA  Anesthesia Plan Comments:        Anesthesia Quick Evaluation

## 2021-03-19 NOTE — Progress Notes (Signed)
PROGRESS NOTE    Heather Saunders  [id number] DOB: [birth year] DOA: 03/17/2021 PCP: Kathyrn Lass, MD    Brief Narrative:  Heather Saunders was admitted to the hospital with a working diagnosis of new onset atrial fibrillation with rapid ventricular response.   87 year old female past medical history for coronary artery disease, hypertension and dyslipidemia who presented with sudden onset of throat discomfort/chest pain.  Apparently not feeling well for about 4 weeks, gradually worsening.  When EMS arrived she was in atrial fibrillation with rapid ventricular response that prompted her to be brought to the hospital.  She was placed on diltiazem drip, blood pressure 128/85, heart rate 102, respiratory 12, oxygen saturation 96%.  Her lungs were clear to auscultation, heart S1-S2, present, irregularly irregular, abdomen soft, no lower extremity edema.   Sodium 135, potassium 4.3, chloride 107, bicarb 14, glucose 118, BUN 80, creatinine 5.0, anion gap 14, white count 13.4, hemoglobin 10.8, hematocrit 33.9, platelets 194. SARS COVID-19 negative.  Urinalysis specific gravity 1.018, 100 protein, 0-5 white cells.   Chest radiograph with increased insertional markings bilaterally.   EKG 133 bpm, normal axis, normal QTC, atrial fibrillation rhythm, no significant ST segment or T wave changes.  Patient was placed on diltiazem and heparin infusion for good toleration.  Plan for inpatient cardioversion.    Assessment & Plan:   Principal Problem:   Atrial fibrillation with RVR (HCC) Active Problems:   Essential hypertension   Mixed hyperlipidemia   AKI (acute kidney injury) ([place])   Atrial fibrillation with rapid ventricular response, (new onset).  Patient continue to feel very weak and deconditioned, worse with movement, no chest pain or frank dyspnea.  Telemetry personally reviewed, continue atrial fibrillation rhythm with rate in the low 100 and high 90's.  No frank signs of acute heart  failure.    Continue rate control with metoprolol and anticoagulation with heparin.  Plan for TEE/ electrical cardioversion on Wednesday. Follow up on transthoracic echocardiogram.    2. AKI on CKD IV with anion gap metabolic acidosis documented urine output is 350 ml (possible not accurate). Serum cr is down to 3,85 with K of 4,3 and serum bicarbonate at 17.    Continue hydration with isotonic saline at 75 ml per hr, follow up on renal function in am, avoid hypotension and nephrotoxic medications.    3. HTN/ dyslipidemia. Holding antihypertensive medications, continue rate control atrial fibrillation.   On atorvastatin and ezetimibe.    4. Anemia of chronic disease. Stable hemoglobin at 11.3 with hct at 34.6.    Patient continue to be at high risk for worsening atrial fibrillation   Status is: Inpatient  Remains inpatient appropriate because:IV treatments appropriate due to intensity of illness or inability to take PO  Dispo:  Patient From: Home  Planned Disposition: [place]  Medically stable for discharge: No      DVT prophylaxis: Heparin drip   Code Status:   full  Family Communication:  I spoke over the phone with the patient's nephew about patient's  condition, plan of care, prognosis and all questions were addressed.    Consultants:  Cardiology  Nephrology    Subjective: Patient continue to feel very weak and deconditioned, not back to her baseline, no dyspnea or chest pain, no nausea or vomiting.   Objective: Vitals:   03/18/21 2322 03/19/21 0326 03/19/21 0757 03/19/21 1331  BP: (!) 145/90 (!) 151/84 (!) 164/93 (!) 150/75  Pulse: (!) 110 (!) 104 97 (!) 110  Resp: 20 20  20 (!) 22  Temp: 97.7 F (36.5 C) 98 F (36.7 C) 97.6 F (36.4 C) (!) 97.4 F (36.3 C)  TempSrc: Oral Oral Oral Oral  SpO2: 93% 93% 99% 95%  Weight:  53 kg    Height:        Intake/Output Summary (Last 24 hours) at 03/19/2021 1535 Last data filed at 03/19/2021  [id number] Gross per 24 hour  Intake --  Output 350 ml  Net -350 ml   Filed Weights   03/17/21 2237 03/18/21 1635 03/19/21 0326  Weight: 56.7 kg 53 kg 53 kg    Examination:   General: Not in pain or dyspnea, deconditioned  Neurology: Awake and alert, non focal  E ENT: mild pallor, no icterus, oral mucosa moist Cardiovascular: No JVD. S1-S2 present, rhythmic, no gallops, rubs, or murmurs. No lower extremity edema. Pulmonary: positive breath sounds bilaterally, with no wheezing, rhonchi mild rales at bases. Gastrointestinal. Abdomen soft and non tender Skin. No rashes Musculoskeletal: no joint deformities     Data Reviewed: I have personally reviewed following labs and imaging studies  CBC: Recent Labs  Lab 03/17/21 2315 03/18/21 0500 03/19/21 0309  WBC 13.4* 12.3* 13.7*  HGB 10.8* 10.4* 11.3*  HCT 33.9* 32.6* 34.6*  MCV 87.8 87.2 86.1  PLT 194 203 [id number]   Basic Metabolic Panel: Recent Labs  Lab 03/17/21 2315 03/18/21 0037 03/18/21 0500 03/19/21 0309  NA 135 135 136 137  K 4.3 4.6 4.7 4.3  CL 107 107 106 107  CO2 14* 14* 17* 17*  GLUCOSE 118* 122* 106* 120*  BUN 80* 77* 79* 77*  CREATININE 5.04* 5.05* 4.76* 3.86*  CALCIUM 8.6* 8.8* 8.6* 8.5*  MG 2.0  --  2.2  --   PHOS  --   --  3.6 2.9   GFR: Estimated Creatinine Clearance: 7.5 mL/min (A) (by C-G formula based on SCr of 3.86 mg/dL (H)). Liver Function Tests: Recent Labs  Lab 03/18/21 0500 03/18/21 0808 03/19/21 0309  AST  --  28  --   ALT  --  37  --   ALKPHOS  --  77  --   BILITOT  --  1.3*  --   PROT  --  5.3*  --   ALBUMIN 3.0* 2.9* 2.8*   No results for input(s): LIPASE, AMYLASE in the last 168 hours. No results for input(s): AMMONIA in the last 168 hours. Coagulation Profile: No results for input(s): INR, PROTIME in the last 168 hours. Cardiac Enzymes: No results for input(s): CKTOTAL, CKMB, CKMBINDEX, TROPONINI in the last 168 hours. BNP (last 3 results) No results for input(s): PROBNP in  the last 8760 hours. HbA1C: No results for input(s): HGBA1C in the last 72 hours. CBG: No results for input(s): GLUCAP in the last 168 hours. Lipid Profile: No results for input(s): CHOL, HDL, LDLCALC, TRIG, CHOLHDL, LDLDIRECT in the last 72 hours. Thyroid Function Tests: Recent Labs    03/18/21 0500  TSH 4.302   Anemia Panel: Recent Labs    03/18/21 0500  VITAMINB12 770  FOLATE 26.1  FERRITIN 211  TIBC 214*  IRON 27*      Radiology Studies: I have reviewed all of the imaging during this hospital visit personally     Scheduled Meds:  atorvastatin  40 mg Oral Daily   ezetimibe  10 mg Oral Daily   metoprolol tartrate  50 mg Oral BID   multivitamin with minerals  1 tablet Oral Daily   Continuous Infusions:  sodium chloride  75 mL/hr at 03/19/21 0647   heparin 800 Units/hr (03/19/21 1401)     LOS: 1 day        Coutney Wildermuth Gerome Apley, MD

## 2021-03-19 NOTE — Progress Notes (Signed)
[facility] ROUNDING NOTE   Subjective:   Interval History: 87 year old with history of coronary disease hypertension dyslipidemia was found to be in atrial fibrillation rapid ventricular rate.  She has a history of chronic disease with baseline creatinine 1.6 to 1.9 mg/dL.  She was recently started on lisinopril by primary physician for hypertension.  She is felt to be volume depleted.  Creatinine 5 mg deciliter on admit.  Blood pressure 151/84 pulse 104 temperature 98 O2 sats 94% room air.  Urine output 350 cc recorded 03/19/2021  Sodium 137 potassium 4.3 chloride 107 CO2 17 BUN 77 creatinine 3.86 glucose 120 hemoglobin 9.3.  Urinalysis showed 100 mg deciliter protein otherwise unremarkable  Objective:  Vital signs in last 24 hours:  Temp:  [97.7 F (36.5 C)-98.3 F (36.8 C)] 98 F (36.7 C) (06/20 0326) Pulse Rate:  [97-116] 104 (06/20 0326) Resp:  [12-27] 20 (06/20 0326) BP: (122-157)/(76-90) 151/84 (06/20 0326) SpO2:  [93 %-97 %] 93 % (06/20 0326) Weight:  [53 kg] 53 kg (06/20 0326)  Weight change: -3.7 kg Filed Weights   03/17/21 2237 03/18/21 1635 03/19/21 0326  Weight: 56.7 kg 53 kg 53 kg    Intake/Output: I/O last 3 completed shifts: In: 1000 [I.V.:1000] Out: 350 [Urine:350]   Intake/Output this shift:  No intake/output data recorded.  Gen alert, no distress No rash, cyanosis or gangrene Sclera anicteric, throat clear No jvd or bruits Chest clear bilat to bases, no rales/ wheezing RRR no MRG Abd soft ntnd no mass or ascites +bs GU deferred MS no joint effusions or deformity Ext no LE or UE edema, no wounds or ulcers Neuro is alert, Ox 3 , nf   Basic Metabolic Panel: Recent Labs  Lab 03/17/21 2315 03/18/21 0037 03/18/21 0500 03/19/21 0309  NA 135 135 136 137  K 4.3 4.6 4.7 4.3  CL 107 107 106 107  CO2 14* 14* 17* 17*  GLUCOSE 118* 122* 106* 120*  BUN 80* 77* 79* 77*  CREATININE 5.04* 5.05* 4.76* 3.86*  CALCIUM 8.6* 8.8* 8.6* 8.5*   MG 2.0  --  2.2  --   PHOS  --   --  3.6 2.9    Liver Function Tests: Recent Labs  Lab 03/18/21 0500 03/18/21 0808 03/19/21 0309  AST  --  28  --   ALT  --  37  --   ALKPHOS  --  77  --   BILITOT  --  1.3*  --   PROT  --  5.3*  --   ALBUMIN 3.0* 2.9* 2.8*   No results for input(s): LIPASE, AMYLASE in the last 168 hours. No results for input(s): AMMONIA in the last 168 hours.  CBC: Recent Labs  Lab 03/17/21 2315 03/18/21 0500 03/19/21 0309  WBC 13.4* 12.3* 13.7*  HGB 10.8* 10.4* 11.3*  HCT 33.9* 32.6* 34.6*  MCV 87.8 87.2 86.1  PLT 194 203 220    Cardiac Enzymes: No results for input(s): CKTOTAL, CKMB, CKMBINDEX, TROPONINI in the last 168 hours.  BNP: Invalid input(s): POCBNP  CBG: No results for input(s): GLUCAP in the last 168 hours.  Microbiology: Results for orders placed or performed during the hospital encounter of 03/17/21  Resp Panel by RT-PCR (Flu A&B, Covid) Nasopharyngeal Swab     Status: None   Collection Time: 03/17/21 11:33 PM   Specimen: Nasopharyngeal Swab; Nasopharyngeal(NP) swabs in vial transport medium  Result Value Ref Range Status   SARS Coronavirus 2 by RT PCR NEGATIVE NEGATIVE Final  Comment: (NOTE) SARS-CoV-2 target nucleic acids are NOT DETECTED.  The SARS-CoV-2 RNA is generally detectable in upper respiratory specimens during the acute phase of infection. The lowest concentration of SARS-CoV-2 viral copies this assay can detect is 138 copies/mL. A negative result does not preclude SARS-Cov-2 infection and should not be used as the sole basis for treatment or other patient management decisions. A negative result may occur with  improper specimen collection/handling, submission of specimen other than nasopharyngeal swab, presence of viral mutation(s) within the areas targeted by this assay, and inadequate number of viral copies(<138 copies/mL). A negative result must be combined with clinical observations, patient history, and  epidemiological information. The expected result is Negative.  Fact Sheet for Patients:  EntrepreneurPulse.com.au  Fact Sheet for Healthcare Providers:  IncredibleEmployment.be  This test is no t yet approved or cleared by the [place] FDA and  has been authorized for detection and/or diagnosis of SARS-CoV-2 by FDA under an Emergency Use Authorization (EUA). This EUA will remain  in effect (meaning this test can be used) for the duration of the COVID-19 declaration under Section 564(b)(1) of the Act, 21 U.S.C.section 360bbb-3(b)(1), unless the authorization is terminated  or revoked sooner.       Influenza A by PCR NEGATIVE NEGATIVE Final   Influenza B by PCR NEGATIVE NEGATIVE Final    Comment: (NOTE) The Xpert Xpress SARS-CoV-2/FLU/RSV plus assay is intended as an aid in the diagnosis of influenza from Nasopharyngeal swab specimens and should not be used as a sole basis for treatment. Nasal washings and aspirates are unacceptable for Xpert Xpress SARS-CoV-2/FLU/RSV testing.  Fact Sheet for Patients: EntrepreneurPulse.com.au  Fact Sheet for Healthcare Providers: IncredibleEmployment.be  This test is not yet approved or cleared by the [place] FDA and has been authorized for detection and/or diagnosis of SARS-CoV-2 by FDA under an Emergency Use Authorization (EUA). This EUA will remain in effect (meaning this test can be used) for the duration of the COVID-19 declaration under Section 564(b)(1) of the Act, 21 U.S.C. section 360bbb-3(b)(1), unless the authorization is terminated or revoked.  Performed at [facility], [place] [address] [place], [place] [address]     Coagulation Studies: No results for input(s): LABPROT, INR in the last 72 hours.  Urinalysis: Recent Labs    03/18/21 0720  COLORURINE YELLOW  LABSPEC 1.018  PHURINE 5.0  GLUCOSEU NEGATIVE  HGBUR NEGATIVE   BILIRUBINUR NEGATIVE  KETONESUR 5*  PROTEINUR 100*  NITRITE NEGATIVE  LEUKOCYTESUR NEGATIVE      Imaging: US RENAL  Result Date: 03/18/2021 CLINICAL DATA:  87 year old female with acute renal insufficiency. EXAM: RENAL / URINARY TRACT ULTRASOUND COMPLETE COMPARISON:  CT Abdomen and Pelvis 10/10/2020. FINDINGS: Right Kidney: Renal measurements: 9.5 x 3.4 x 4.6 cm = volume: 78 mL. Renal cortical thinning and mildly echogenic renal parenchyma (images 7 and 8). No right hydronephrosis or renal mass, lesion. Left Kidney: Renal measurements: 10.6 x 3.9 x 4.6 cm = volume: 98 mL. Similar renal cortical thinning and echogenicity to the right side. Small benign exophytic lower pole cyst measures 14 mm and appears inconsequential (image 41). No left hydronephrosis or renal mass. Bladder: Appears normal for degree of bladder distention. Other: Uterus partially visible, grossly stable and within normal limits compared to January. IMPRESSION: Some evidence of chronic medical renal disease.  Otherwise negative. Electronically Signed   By: Genevie Ann M.D.   On: 03/18/2021 05:11   DG Chest Port 1 View  Result Date: 03/17/2021 CLINICAL DATA:  Atrial fibrillation, sore throat EXAM: PORTABLE CHEST 1 VIEW COMPARISON:  10/06/2020 FINDINGS: Single frontal view of the chest demonstrates an enlarged cardiac silhouette. There is central vascular prominence, with mild diffuse interstitial opacities consistent with developing edema. No effusion or pneumothorax. IMPRESSION: 1. Findings consistent with mild interstitial edema. Electronically Signed   By: Randa Ngo M.D.   On: 03/17/2021 23:34     Medications:    sodium chloride 75 mL/hr at 03/19/21 [id number]    aspirin EC  81 mg Oral Daily   atorvastatin  40 mg Oral Daily   ezetimibe  10 mg Oral Daily   heparin injection (subcutaneous)  5,000 Units Subcutaneous Q8H   metoprolol tartrate  25 mg Oral BID   multivitamin with minerals  1 tablet Oral Daily   sodium  bicarbonate  1,300 mg Oral TID   acetaminophen, melatonin, metoprolol tartrate, polyethylene glycol, prochlorperazine  Assessment/ Plan:  Acute kidney injury Baseline serum creatinine about 2 mg/dL.  Creatinine appears to be improving.  Urine output present.  We will continue to follow.  Urinalysis bland.  Continues on gentle hydration at 75 cc an hour.  Continue to avoid nonsteroidal steroidal anti-inflammatory drugs as well as ACE inhibitor's and ARB's.  Lisinopril held Metabolic acidosis continue oral sodium bicarbonate Atrial fibrillation rate control per cardiology Hypertension stable.   LOS: [place] '@TODAY''@7'$ :47 AM

## 2021-03-19 NOTE — Progress Notes (Addendum)
Progress Note  Patient Name: Heather Saunders Date of Encounter: 03/19/2021  [facility] Cardiologist: Dorris Carnes, MD   Subjective   Denies any chest pain or SOB  Inpatient Medications    Scheduled Meds:  aspirin EC  81 mg Oral Daily   atorvastatin  40 mg Oral Daily   ezetimibe  10 mg Oral Daily   heparin injection (subcutaneous)  5,000 Units Subcutaneous Q8H   metoprolol tartrate  25 mg Oral BID   multivitamin with minerals  1 tablet Oral Daily   Continuous Infusions:  sodium chloride 75 mL/hr at 03/19/21 0647   PRN Meds: acetaminophen, melatonin, metoprolol tartrate, polyethylene glycol, prochlorperazine   Vital Signs    Vitals:   03/18/21 2100 03/18/21 2322 03/19/21 0326 03/19/21 0757  BP:  (!) 145/90 (!) 151/84 (!) 164/93  Pulse:  (!) 110 (!) 104 97  Resp: '20 20 20 20  '$ Temp:  97.7 F (36.5 C) 98 F (36.7 C) 97.6 F (36.4 C)  TempSrc:  Oral Oral Oral  SpO2:  93% 93% 99%  Weight:   53 kg   Height:        Intake/Output Summary (Last 24 hours) at 03/19/2021 0948 Last data filed at 03/19/2021 0647 Gross per 24 hour  Intake --  Output 350 ml  Net -350 ml   Last 3 Weights 03/19/2021 03/18/2021 03/17/2021  Weight (lbs) 116 lb 13.5 oz 116 lb 13.5 oz 125 lb  Weight (kg) 53 kg 53 kg 56.7 kg      Telemetry    Atrial fibrillation with RVR HR 110-140's - Personally Reviewed  ECG    No new EKG to review - Personally Reviewed  Physical Exam   GEN: thin and frail appearing Neck: No JVD Cardiac: irregularly irregular and tachy, no murmurs, rubs, or gallops.  Respiratory: Clear to auscultation bilaterally. GI: Soft, nontender, non-distended  MS: No edema; No deformity. Neuro:  Nonfocal  Psych: Normal affect   Labs    High Sensitivity Troponin:   Recent Labs  Lab 03/17/21 2333 03/18/21 0037  TROPONINIHS 53* 51*      Chemistry Recent Labs  Lab 03/18/21 0037 03/18/21 0500 03/18/21 0808 03/19/21 0309  NA 135 136  --  137  K 4.6 4.7  --  4.3   CL 107 106  --  107  CO2 14* 17*  --  17*  GLUCOSE 122* 106*  --  120*  BUN 77* 79*  --  77*  CREATININE 5.05* 4.76*  --  3.86*  CALCIUM 8.8* 8.6*  --  8.5*  PROT  --   --  5.3*  --   ALBUMIN  --  3.0* 2.9* 2.8*  AST  --   --  28  --   ALT  --   --  37  --   ALKPHOS  --   --  77  --   BILITOT  --   --  1.3*  --   GFRNONAA 7* 8*  --  10*  ANIONGAP 14 13  --  13     Hematology Recent Labs  Lab 03/17/21 2315 03/18/21 0500 03/19/21 0309  WBC 13.4* 12.3* 13.7*  RBC 3.86* 3.74* 4.02  HGB 10.8* 10.4* 11.3*  HCT 33.9* 32.6* 34.6*  MCV 87.8 87.2 86.1  MCH 28.0 27.8 28.1  MCHC 31.9 31.9 32.7  RDW 15.6* 15.7* 15.6*  PLT 194 203 220    BNP Recent Labs  Lab 03/18/21 0038  BNP 815.8*  DDimer No results for input(s): DDIMER in the last 168 hours.   CHA2DS2-VASc Score = 5  This indicates a 7.2% annual risk of stroke. The patient's score is based upon: CHF History: No HTN History: Yes Diabetes History: No Stroke History: No Vascular Disease History: Yes Age Score: 2 Gender Score: 1     Radiology    US RENAL  Result Date: 03/18/2021 CLINICAL DATA:  87 year old female with acute renal insufficiency. EXAM: RENAL / URINARY TRACT ULTRASOUND COMPLETE COMPARISON:  CT Abdomen and Pelvis 10/10/2020. FINDINGS: Right Kidney: Renal measurements: 9.5 x 3.4 x 4.6 cm = volume: 78 mL. Renal cortical thinning and mildly echogenic renal parenchyma (images 7 and 8). No right hydronephrosis or renal mass, lesion. Left Kidney: Renal measurements: 10.6 x 3.9 x 4.6 cm = volume: 98 mL. Similar renal cortical thinning and echogenicity to the right side. Small benign exophytic lower pole cyst measures 14 mm and appears inconsequential (image 41). No left hydronephrosis or renal mass. Bladder: Appears normal for degree of bladder distention. Other: Uterus partially visible, grossly stable and within normal limits compared to January. IMPRESSION: Some evidence of chronic medical renal disease.   Otherwise negative. Electronically Signed   By: Genevie Ann M.D.   On: 03/18/2021 05:11   DG CHEST PORT 1 VIEW  Result Date: 03/19/2021 CLINICAL DATA:  Atrial fibrillation. EXAM: PORTABLE CHEST 1 VIEW COMPARISON:  03/17/2021 FINDINGS: Cardiomegaly, pulmonary vascular congestion and mild interstitial opacities are again identified. Mild bibasilar opacities likely representing atelectasis noted. No pneumothorax or large pleural effusion identified. No other significant change. IMPRESSION: Cardiomegaly with pulmonary vascular congestion and probable mild interstitial edema. Probable mild bibasilar atelectasis. Electronically Signed   By: Margarette Canada M.D.   On: 03/19/2021 08:45   DG Chest Port 1 View  Result Date: 03/17/2021 CLINICAL DATA:  Atrial fibrillation, sore throat EXAM: PORTABLE CHEST 1 VIEW COMPARISON:  10/06/2020 FINDINGS: Single frontal view of the chest demonstrates an enlarged cardiac silhouette. There is central vascular prominence, with mild diffuse interstitial opacities consistent with developing edema. No effusion or pneumothorax. IMPRESSION: 1. Findings consistent with mild interstitial edema. Electronically Signed   By: Randa Ngo M.D.   On: 03/17/2021 23:34    Cardiac Studies   2D echo 09/2020 IMPRESSIONS     1. Left ventricular ejection fraction, by estimation, is 60 to 65%. The  left ventricle has normal function. The left ventricle has no regional  wall motion abnormalities. There is severe left ventricular hypertrophy.  Left ventricular diastolic parameters   are consistent with Grade II diastolic dysfunction (pseudonormalization).  Elevated left ventricular end-diastolic pressure.   2. Right ventricular systolic function is normal. The right ventricular  size is normal.   3. The mitral valve is abnormal. Mild mitral valve regurgitation. No  evidence of mitral stenosis.   4. The aortic valve is tricuspid. There is moderate calcification of the  aortic valve. There is  moderate thickening of the aortic valve. Aortic  valve regurgitation is mild. Mild to moderate aortic valve  sclerosis/calcification is present, without any  evidence of aortic stenosis.   5. The inferior vena cava is normal in size with greater than 50%  respiratory variability, suggesting right atrial pressure of 3 mmHg.  Patient Profile     87 y.o. female with CKD, CAD, HLD, HTN, and osteoporosis who presents with generalized fatigue, throat pain and tachycardia who was found to have new onset AF/RVR.  Assessment & Plan    New onset atrial fibrillation with  RVR -presented with worsening malaise for several weeks as well as sudden onset of throat discomfort in setting of AKI with SCr 5 (baseline 1.2)>>suspect she has been in afib for a while -CHADS2VASC score is 5 -started on IV Heparin gtt but now appears to be only on SQ Heparin for DVT prophylaxis -TRH notes say start Eliquis but this was not ordered -Also anemic so there was concern about starting full dose anticoagulation.  -2D echo pending -notes from Cards yesterday said to start IV Amio but order never placed and start IV Heparin but this was not ordered either -Hbg 11.3 this am -HR still elevated on tele although currently low 100's while in room with her -increase Lopressor to '50mg'$  BID and titrate for HR control (BP elevated so have room to increase BB if needed) -hold off on Amio at this time as BP allows room for uptitration of BB -discussed with TRH and OK to anticoagulate>>they recommend placing on IV Heparin gtt until her renal function improves and then change to Eliquis 2.'5mg'$  BID -plan for TEE/DCCV tomorrow (Tuesday) -Shared Decision Making/Informed Consent The risks [stroke, cardiac arrhythmias rarely resulting in the need for a temporary or permanent pacemaker, skin irritation or burns, esophageal damage, perforation (1:10,000 risk), bleeding, pharyngeal hematoma as well as other potential complications associated  with conscious sedation including aspiration, arrhythmia, respiratory failure and death], benefits (treatment guidance, restoration of normal sinus rhythm, diagnostic support) and alternatives of a transesophageal echocardiogram guided cardioversion were discussed in detail with Ms. Kinn and she is willing to proceed.   Elevated Troponin -minimally elevated at 53 and 51 -likely demand ischemia in the setting of afib with RVR -LVF normal on echo in Jan 2022 -repeat echo pending>>if EF normal then no further ischemic workup -she denies any CP or SOB -she did have some throat pain but sounded more like a sore throat  ASCAD -nonobstructive by cath in the distant past -no anginal symptoms -had throat pain but sounds more like a sore throat and not actual angina -continue BB and statin -stop ASA since we are adding DOAC  AKI -seen by nephrology and feels likely due to prerenal azotemia and uptitration of ACE I -baseline SCr 2 and improved from 5 to 3.86 today with IVF -renal following  I have spent a total of 35 minutes with patient reviewing 2D echo from January, hospital notes including nephrology consult , telemetry, EKGs, labs and examining patient as well as establishing an assessment and plan that was discussed with the patient.  > 50% of time was spent in direct patient care.       For questions or updates, please contact [place] Please consult [email or web address] for contact info under        Signed, Fransico Him, MD  03/19/2021, 9:48 AM

## 2021-03-19 NOTE — Progress Notes (Signed)
  Echocardiogram 2D Echocardiogram has been performed.  Heather Saunders 03/19/2021, 3:51 PM

## 2021-03-19 NOTE — Progress Notes (Signed)
ANTICOAGULATION CONSULT NOTE - Initial Consult  Pharmacy Consult for heparin Indication: atrial fibrillation  Allergies  Allergen Reactions   Boniva [Ibandronic Acid]     Sore throat   Fosamax [Alendronate Sodium]     Sore throat    Patient Measurements: Height: '5\' 4"'$  (162.6 cm) Weight: 53 kg (116 lb 13.5 oz) IBW/kg (Calculated) : 54.7 Heparin Dosing Weight: 53 kg  Vital Signs: Temp: 97.6 F (36.4 C) (06/20 0757) Temp Source: Oral (06/20 0757) BP: 164/93 (06/20 0757) Pulse Rate: 97 (06/20 0757)  Labs: Recent Labs    03/17/21 2315 03/17/21 2333 03/18/21 0037 03/18/21 0500 03/19/21 0309  HGB 10.8*  --   --  10.4* 11.3*  HCT 33.9*  --   --  32.6* 34.6*  PLT 194  --   --  203 220  CREATININE 5.04*  --  5.05* 4.76* 3.86*  TROPONINIHS  --  53* 51*  --   --     Estimated Creatinine Clearance: 7.5 mL/min (A) (by C-G formula based on SCr of 3.86 mg/dL (H)).   Medical History: Past Medical History:  Diagnosis Date   Chronic kidney disease    Coronary disease    NonObstructive   History of nuclear stress test    Nuclear stress test 10/18: EF 96, normal perfusion, low risk   Hyperlipidemia    Hypertension    Myocardial infarct [facility]) 2006   Osteopenia    Osteoporosis     Medications:  Medications Prior to Admission  Medication Sig Dispense Refill Last Dose   acetaminophen (TYLENOL) 500 MG tablet Take 500 mg by mouth every 6 (six) hours as needed for mild pain.   03/17/2021   amLODipine (NORVASC) 5 MG tablet TAKE 1 TABLET BY MOUTH TWICE A DAY (Patient taking differently: Take 5 mg by mouth in the morning and at bedtime.) 180 tablet 1 03/16/2021   aspirin 81 MG tablet Take 81 mg by mouth daily.   03/16/2021   atorvastatin (LIPITOR) 40 MG tablet Take 40 mg by mouth daily.   03/16/2021   Cholecalciferol 100 MCG (4000 UT) CAPS Take 4,000 Units by mouth daily.   03/16/2021   ezetimibe (ZETIA) 10 MG tablet TAKE 1 TABLET BY MOUTH EVERY DAY (Patient taking differently: Take 10  mg by mouth daily.) 90 tablet 3 03/16/2021   hydrALAZINE (APRESOLINE) 25 MG tablet Take 25 mg by mouth in the morning, at noon, and at bedtime. With food   03/17/2021   metoprolol tartrate (LOPRESSOR) 50 MG tablet Take 50 mg by mouth 2 (two) times daily. With food   03/17/2021 at 1000   Multiple Vitamin (MULTIVITAMIN) capsule Take 1 capsule by mouth daily.   Past Month   polyethylene glycol (MIRALAX MIX-IN PAX) 17 g packet Take 17 g by mouth daily as needed for mild constipation.   Past Week    Assessment: 87 y.o. female with new onset AF/RVR. Pharmacy consulted to start heparin. Of note, patient CKD 3 at baseline with acute AKI. Not on anticoagulation PTA. Has been on 5000 units subcutaneous heparin q8h with last dose @ 0641 this morning.   Goal of Therapy:  Heparin level 0.3-0.7 units/ml Monitor platelets by anticoagulation protocol: Yes   Plan:  Start heparin infusion at 800 units/hr Check anti-Xa level in 8 hours and daily while on heparin Continue to monitor H&H and platelets F/u transition to apixaban once AKI resolves   Thank you for allowing [place] to participate in this patients care. Jens Som, PharmD 03/19/2021  10:58 AM  Please check [email or web address] for unit-specific pharmacy phone numbers.

## 2021-03-19 NOTE — TOC Initial Note (Signed)
Transition of Care [facility]) - Initial/Assessment Note    Patient Details  Name: Heather Saunders MRN: [id number] Date of Birth: 09/29/1925  Transition [facility] CM/SW Contact:    Coralee Pesa, [place] Phone Number: 03/19/2021, 5:11 PM  Clinical Narrative:                 CSW met with pt and friend at bedside to discuss SNF recommendation. At first pt noted she would not like to go to SNF, but after discussing the lack of support she has at home, she agreed. Pt noted she had never been to rehab before and did not have a preference, she was provided with a medicare list. Pt lives alone and has no family nearby, but does have several friends who help her out. Pt has had covid vaccines and one booster, insurance was discussed and all questions were answered. She noted her nephew Shanon Brow is her contact in case she is unable to make decisions or an emergency, but would like to be the first point of contact. She noted no further questions. CSW will workup and faxout for SNF. SW will continue to follow for DC needs.  Expected Discharge Plan: Skilled Nursing Facility Barriers to Discharge: Continued Medical Work up, SNF Pending bed offer, Insurance Authorization   Patient Goals and CMS Choice Patient states their goals for this hospitalization and ongoing recovery are:: Pt is agreeable to SNF placement, with the plan of returning home. CMS Medicare.gov Compare Post Acute Care list provided to:: Patient Choice offered to / list presented to : Patient  Expected Discharge Plan and Services Expected Discharge Plan: [place] Acute Care Choice: [place] Living arrangements for the past 2 months: [place]                                      Prior Living Arrangements/Services Living arrangements for the past 2 months: [place] Lives with:: Self Patient language and need for interpreter reviewed:: Yes Do you feel safe  going back to the place where you live?: Yes            Criminal Activity/Legal Involvement Pertinent to Current Situation/Hospitalization: No - Comment as needed  Activities of Daily Living      Permission Sought/Granted Permission sought to share information with : Family Supports Permission granted to share information with : Yes, Verbal Permission Granted  Share Information with NAME: Verlin Grills     Permission granted to share info w Relationship: Nephew  Permission granted to share info w Contact Information: [phone]  Emotional Assessment Appearance:: Appears stated age Attitude/Demeanor/Rapport: Engaged Affect (typically observed): Pleasant Orientation: : Oriented to Self, Oriented to Place, Oriented to  Time, Oriented to Situation Alcohol / Substance Use: Not Applicable Psych Involvement: No (comment)  Admission diagnosis:  Pulmonary edema [J81.1] Dyspnea on exertion [R06.00] New onset a-fib ([place]) [I48.91] AKI (acute kidney injury) ([place]) [N17.9] Atrial fibrillation with RVR ([place]) [I48.91] Acute kidney injury ([place]) [N17.9] Follow up [Z09] Edema, unspecified type [R60.9] Patient Active Problem List   Diagnosis Date Noted   Atrial fibrillation with RVR ([place]) 03/18/2021   AKI (acute kidney injury) ([place]) 03/18/2021   Essential hypertension 08/03/2018   Coronary artery disease involving native coronary artery of native heart without angina pectoris 08/03/2018   Dyspnea on exertion 08/03/2018   Mixed hyperlipidemia 08/03/2018  PCP:  Kathyrn Lass, MD Pharmacy:   [id number] [id number]: [phone]: [phone]    Social Determinants of Health (SDOH) Interventions    Readmission Risk Interventions No flowsheet data found.

## 2021-03-19 NOTE — Evaluation (Signed)
Occupational Therapy Evaluation Patient Details Name: Heather Saunders MRN: [id number] DOB: [birth year] Today's Date: 03/19/2021    History of Present Illness Pt is a 87 y/o female presenting on 6/18 to ED for sudden onset of throat discomfrot and concern for possible anginal equivalent. Pt found to have AKI on CKD 3B. Pt with new onset a fib with RVR. CXR shows pulmonary edema. PMH: coronary artery disease, essential hypertension, hyperlipidemia.   Clinical Impression   PTA, pt lives alone and reports Independence with ADLs, IADLs and mobility without AD. Pt presents now with deficits in strength, standing balance, and endurance. Pt overall min guard for short mobility, requiring DME support secondary to shakiness in standing. Pt requires Setup for UB ADLs and Min A for LB ADLs due to deficits. Progression of endurance and ADL mobility limited due to tachycardia (up to 158bpm with ADLs). At this time, recommend ST rehab at SNF as pt is below functional baseline and at an increased risk for falls.     Follow Up Recommendations  SNF;Supervision - Intermittent    Equipment Recommendations  3 in 1 bedside commode;Other (comment) (Rolling walker)    Recommendations for Other Services       Precautions / Restrictions Precautions Precautions: Fall;Other (comment) Precaution Comments: watch HR Restrictions Weight Bearing Restrictions: No      Mobility Bed Mobility Overal bed mobility: Needs Assistance Bed Mobility: Supine to Sit;Sit to Supine     Supine to sit: Supervision;HOB elevated Sit to supine: Supervision   General bed mobility comments: supervision for safety & line mgmt    Transfers Overall transfer level: Needs assistance Equipment used: None;Rolling walker (2 wheeled) Transfers: Sit to/from [facility] Sit to Stand: Min guard Stand pivot transfers: Min guard       General transfer comment: min guard for RW use to/from sink. At end of session and  return to bed, pt reports need to urinate, transferred to [facility] without AD reaching for armrest and use of bedrail on return back to bed    Balance Overall balance assessment: Needs assistance Sitting-balance support: Feet supported Sitting balance-Leahy Scale: Fair     Standing balance support: During functional activity Standing balance-Leahy Scale: Poor Standing balance comment: reliant on UE support, shakiness noted without RW use                           ADL either performed or assessed with clinical judgement   ADL Overall ADL's : Needs assistance/impaired Eating/Feeding: Independent;Sitting Eating/Feeding Details (indicate cue type and reason): though NPO for procedure this AM Grooming: Min guard;Standing;Wash/dry face;Brushing hair;Applying deodorant;Sitting Grooming Details (indicate cue type and reason): min guard in standing due to mild shakiness. Performed some tasks seated as HR increasing Upper Body Bathing: Set up;Sitting   Lower Body Bathing: Min guard;Sit to/from stand   Upper Body Dressing : Set up;Sitting   Lower Body Dressing: Minimal assistance;Sitting/lateral leans;Sit to/from stand   Toilet Transfer: Min guard;Ambulation;RW   Toileting- Water quality scientist and Hygiene: Min guard;Sit to/from stand Toileting - Clothing Manipulation Details (indicate cue type and reason): min guard in standing for clothing mgmt. transferred bed <> BSC at end of session. able to perform peri care - reaching out for support and unsteady without AD     Functional mobility during ADLs: Min guard;Rolling walker;Cueing for sequencing;Cueing for safety General ADL Comments: pt endorses feeling weak, noted to rely on UE support when typically does not use AD at home. Limited by  a fib with tachycardia     Vision Baseline Vision/History: Wears glasses Wears Glasses: At all times Patient Visual Report: No change from baseline Vision Assessment?: No apparent visual  deficits     Perception     Praxis      Pertinent Vitals/Pain Pain Assessment: No/denies pain     Hand Dominance Right   Extremity/Trunk Assessment Upper Extremity Assessment Upper Extremity Assessment: Generalized weakness   Lower Extremity Assessment Lower Extremity Assessment: Defer to PT evaluation   Cervical / Trunk Assessment Cervical / Trunk Assessment: Kyphotic   Communication Communication Communication: HOH   Cognition Arousal/Alertness: Awake/alert Behavior During Therapy: WFL for tasks assessed/performed Overall Cognitive Status: Impaired/Different from baseline Area of Impairment: Safety/judgement                         Safety/Judgement: Decreased awareness of safety;Decreased awareness of deficits     General Comments: WFL cognitively, pleasant. Aware of weakness but requires cues for continued use of RW to decrease fall risk as pt reaching out for items   General Comments  Pt 110s HR at rest, up to 158bpm with activity (highest with toileting task at end of session). Encouraged seated rest breaks and deferred further exertion due to HR    Exercises     Shoulder Instructions      Home Living Family/patient expects to be discharged to:: Private residence Living Arrangements: Alone Available Help at Discharge: Friend(s);Available PRN/intermittently Type of Home: Apartment Home Access: Level entry     Home Layout: One level     Bathroom Shower/Tub: Teacher, early years/pre: Standard     Home Equipment: None          Prior Functioning/Environment Level of Independence: Independent        Comments: Pt independent at baseline without use of an AD, drives, grocery shops, and performs ADLs independently. denies any falls        OT Problem List: Decreased strength;Decreased activity tolerance;Impaired balance (sitting and/or standing);Decreased knowledge of use of DME or AE      OT Treatment/Interventions:  Self-care/ADL training;Therapeutic exercise;Energy conservation;DME and/or AE instruction;Therapeutic activities;Patient/family education;Balance training    OT Goals(Current goals can be found in the care plan section) Acute Rehab OT Goals Patient Stated Goal: Return to baseline and go home OT Goal Formulation: With patient Time For Goal Achievement: 04/02/21 Potential to Achieve Goals: Good ADL Goals Pt Will Perform Grooming: with modified independence;standing Pt Will Perform Lower Body Bathing: with modified independence;sitting/lateral leans;sit to/from stand Pt Will Perform Lower Body Dressing: with modified independence;sit to/from stand;sitting/lateral leans Pt Will Transfer to Toilet: with modified independence;ambulating Pt Will Perform Tub/Shower Transfer: with set-up;Tub transfer;ambulating;rolling walker Pt/caregiver will Perform Home Exercise Program: Increased strength;Both right and left upper extremity;With theraband;With written HEP provided;With Supervision  OT Frequency: Min 2X/week   Barriers to D/C:            Co-evaluation              AM-PAC OT "6 Clicks" Daily Activity     Outcome Measure Help from another person eating meals?: None Help from another person taking care of personal grooming?: A Little Help from another person toileting, which includes using toliet, bedpan, or urinal?: A Little Help from another person bathing (including washing, rinsing, drying)?: A Little Help from another person to put on and taking off regular upper body clothing?: A Little Help from another person to put on and taking off  regular lower body clothing?: A Little 6 Click Score: 19   End of Session Equipment Utilized During Treatment: Surveyor, mining Communication: Mobility status;Other (comment) (HR - RN present during much of session)  Activity Tolerance: Treatment limited secondary to medical complications (Comment) (tachycardia) Patient left: in bed;with bed  alarm set;with call bell/phone within reach  OT Visit Diagnosis: Unsteadiness on feet (R26.81);Other abnormalities of gait and mobility (R26.89);Muscle weakness (generalized) (M62.81)                Time: [id number] OT Time Calculation (min): 34 min Charges:  OT General Charges $OT Visit: 1 Visit OT Evaluation $OT Eval Moderate Complexity: 1 Mod OT Treatments $Self Care/Home Management : 8-22 mins  Malachy Chamber, OTR/L Acute Rehab Services Office: [phone]   Layla Maw 03/19/2021, 7:47 AM

## 2021-03-19 NOTE — Progress Notes (Signed)
Heart Failure Navigator Progress Note  Assessed for Heart & Vascular TOC clinic readiness.  Unfortunately at this time the patient does not meet criteria due to AKI on CKD IV (CrCl <20).   Navigator available for reassessment of patient.   Kerby Nora, PharmD, BCPS Heart Failure Stewardship Pharmacist Phone [phone]

## 2021-03-19 NOTE — H&P (View-Only) (Signed)
Progress Note  Patient Name: Heather Saunders Date of Encounter: 03/19/2021  [facility] Cardiologist: Dorris Carnes, MD   Subjective   Denies any chest pain or SOB  Inpatient Medications    Scheduled Meds:  aspirin EC  81 mg Oral Daily   atorvastatin  40 mg Oral Daily   ezetimibe  10 mg Oral Daily   heparin injection (subcutaneous)  5,000 Units Subcutaneous Q8H   metoprolol tartrate  25 mg Oral BID   multivitamin with minerals  1 tablet Oral Daily   Continuous Infusions:  sodium chloride 75 mL/hr at 03/19/21 0647   PRN Meds: acetaminophen, melatonin, metoprolol tartrate, polyethylene glycol, prochlorperazine   Vital Signs    Vitals:   03/18/21 2100 03/18/21 2322 03/19/21 0326 03/19/21 0757  BP:  (!) 145/90 (!) 151/84 (!) 164/93  Pulse:  (!) 110 (!) 104 97  Resp: '20 20 20 20  '$ Temp:  97.7 F (36.5 C) 98 F (36.7 C) 97.6 F (36.4 C)  TempSrc:  Oral Oral Oral  SpO2:  93% 93% 99%  Weight:   53 kg   Height:        Intake/Output Summary (Last 24 hours) at 03/19/2021 0948 Last data filed at 03/19/2021 0647 Gross per 24 hour  Intake --  Output 350 ml  Net -350 ml   Last 3 Weights 03/19/2021 03/18/2021 03/17/2021  Weight (lbs) 116 lb 13.5 oz 116 lb 13.5 oz 125 lb  Weight (kg) 53 kg 53 kg 56.7 kg      Telemetry    Atrial fibrillation with RVR HR 110-140's - Personally Reviewed  ECG    No new EKG to review - Personally Reviewed  Physical Exam   GEN: thin and frail appearing Neck: No JVD Cardiac: irregularly irregular and tachy, no murmurs, rubs, or gallops.  Respiratory: Clear to auscultation bilaterally. GI: Soft, nontender, non-distended  MS: No edema; No deformity. Neuro:  Nonfocal  Psych: Normal affect   Labs    High Sensitivity Troponin:   Recent Labs  Lab 03/17/21 2333 03/18/21 0037  TROPONINIHS 53* 51*      Chemistry Recent Labs  Lab 03/18/21 0037 03/18/21 0500 03/18/21 0808 03/19/21 0309  NA 135 136  --  137  K 4.6 4.7  --  4.3   CL 107 106  --  107  CO2 14* 17*  --  17*  GLUCOSE 122* 106*  --  120*  BUN 77* 79*  --  77*  CREATININE 5.05* 4.76*  --  3.86*  CALCIUM 8.8* 8.6*  --  8.5*  PROT  --   --  5.3*  --   ALBUMIN  --  3.0* 2.9* 2.8*  AST  --   --  28  --   ALT  --   --  37  --   ALKPHOS  --   --  77  --   BILITOT  --   --  1.3*  --   GFRNONAA 7* 8*  --  10*  ANIONGAP 14 13  --  13     Hematology Recent Labs  Lab 03/17/21 2315 03/18/21 0500 03/19/21 0309  WBC 13.4* 12.3* 13.7*  RBC 3.86* 3.74* 4.02  HGB 10.8* 10.4* 11.3*  HCT 33.9* 32.6* 34.6*  MCV 87.8 87.2 86.1  MCH 28.0 27.8 28.1  MCHC 31.9 31.9 32.7  RDW 15.6* 15.7* 15.6*  PLT 194 203 220    BNP Recent Labs  Lab 03/18/21 0038  BNP 815.8*  DDimer No results for input(s): DDIMER in the last 168 hours.   CHA2DS2-VASc Score = 5  This indicates a 7.2% annual risk of stroke. The patient's score is based upon: CHF History: No HTN History: Yes Diabetes History: No Stroke History: No Vascular Disease History: Yes Age Score: 2 Gender Score: 1     Radiology    US RENAL  Result Date: 03/18/2021 CLINICAL DATA:  87 year old female with acute renal insufficiency. EXAM: RENAL / URINARY TRACT ULTRASOUND COMPLETE COMPARISON:  CT Abdomen and Pelvis 10/10/2020. FINDINGS: Right Kidney: Renal measurements: 9.5 x 3.4 x 4.6 cm = volume: 78 mL. Renal cortical thinning and mildly echogenic renal parenchyma (images 7 and 8). No right hydronephrosis or renal mass, lesion. Left Kidney: Renal measurements: 10.6 x 3.9 x 4.6 cm = volume: 98 mL. Similar renal cortical thinning and echogenicity to the right side. Small benign exophytic lower pole cyst measures 14 mm and appears inconsequential (image 41). No left hydronephrosis or renal mass. Bladder: Appears normal for degree of bladder distention. Other: Uterus partially visible, grossly stable and within normal limits compared to January. IMPRESSION: Some evidence of chronic medical renal disease.   Otherwise negative. Electronically Signed   By: Genevie Ann M.D.   On: 03/18/2021 05:11   DG CHEST PORT 1 VIEW  Result Date: 03/19/2021 CLINICAL DATA:  Atrial fibrillation. EXAM: PORTABLE CHEST 1 VIEW COMPARISON:  03/17/2021 FINDINGS: Cardiomegaly, pulmonary vascular congestion and mild interstitial opacities are again identified. Mild bibasilar opacities likely representing atelectasis noted. No pneumothorax or large pleural effusion identified. No other significant change. IMPRESSION: Cardiomegaly with pulmonary vascular congestion and probable mild interstitial edema. Probable mild bibasilar atelectasis. Electronically Signed   By: Margarette Canada M.D.   On: 03/19/2021 08:45   DG Chest Port 1 View  Result Date: 03/17/2021 CLINICAL DATA:  Atrial fibrillation, sore throat EXAM: PORTABLE CHEST 1 VIEW COMPARISON:  10/06/2020 FINDINGS: Single frontal view of the chest demonstrates an enlarged cardiac silhouette. There is central vascular prominence, with mild diffuse interstitial opacities consistent with developing edema. No effusion or pneumothorax. IMPRESSION: 1. Findings consistent with mild interstitial edema. Electronically Signed   By: Randa Ngo M.D.   On: 03/17/2021 23:34    Cardiac Studies   2D echo 09/2020 IMPRESSIONS     1. Left ventricular ejection fraction, by estimation, is 60 to 65%. The  left ventricle has normal function. The left ventricle has no regional  wall motion abnormalities. There is severe left ventricular hypertrophy.  Left ventricular diastolic parameters   are consistent with Grade II diastolic dysfunction (pseudonormalization).  Elevated left ventricular end-diastolic pressure.   2. Right ventricular systolic function is normal. The right ventricular  size is normal.   3. The mitral valve is abnormal. Mild mitral valve regurgitation. No  evidence of mitral stenosis.   4. The aortic valve is tricuspid. There is moderate calcification of the  aortic valve. There is  moderate thickening of the aortic valve. Aortic  valve regurgitation is mild. Mild to moderate aortic valve  sclerosis/calcification is present, without any  evidence of aortic stenosis.   5. The inferior vena cava is normal in size with greater than 50%  respiratory variability, suggesting right atrial pressure of 3 mmHg.  Patient Profile     86 y.o. female with CKD, CAD, HLD, HTN, and osteoporosis who presents with generalized fatigue, throat pain and tachycardia who was found to have new onset AF/RVR.  Assessment & Plan    New onset atrial fibrillation with  RVR -presented with worsening malaise for several weeks as well as sudden onset of throat discomfort in setting of AKI with SCr 5 (baseline 1.2)>>suspect she has been in afib for a while -CHADS2VASC score is 5 -started on IV Heparin gtt but now appears to be only on SQ Heparin for DVT prophylaxis -TRH notes say start Eliquis but this was not ordered -Also anemic so there was concern about starting full dose anticoagulation.  -2D echo pending -notes from Cards yesterday said to start IV Amio but order never placed and start IV Heparin but this was not ordered either -Hbg 11.3 this am -HR still elevated on tele although currently low 100's while in room with her -increase Lopressor to '50mg'$  BID and titrate for HR control (BP elevated so have room to increase BB if needed) -hold off on Amio at this time as BP allows room for uptitration of BB -discussed with TRH and OK to anticoagulate>>they recommend placing on IV Heparin gtt until her renal function improves and then change to Eliquis 2.'5mg'$  BID -plan for TEE/DCCV tomorrow (Tuesday) -Shared Decision Making/Informed Consent The risks [stroke, cardiac arrhythmias rarely resulting in the need for a temporary or permanent pacemaker, skin irritation or burns, esophageal damage, perforation (1:10,000 risk), bleeding, pharyngeal hematoma as well as other potential complications associated  with conscious sedation including aspiration, arrhythmia, respiratory failure and death], benefits (treatment guidance, restoration of normal sinus rhythm, diagnostic support) and alternatives of a transesophageal echocardiogram guided cardioversion were discussed in detail with Ms. Stollings and she is willing to proceed.   Elevated Troponin -minimally elevated at 53 and 51 -likely demand ischemia in the setting of afib with RVR -LVF normal on echo in Jan 2022 -repeat echo pending>>if EF normal then no further ischemic workup -she denies any CP or SOB -she did have some throat pain but sounded more like a sore throat  ASCAD -nonobstructive by cath in the distant past -no anginal symptoms -had throat pain but sounds more like a sore throat and not actual angina -continue BB and statin -stop ASA since we are adding DOAC  AKI -seen by nephrology and feels likely due to prerenal azotemia and uptitration of ACE I -baseline SCr 2 and improved from 5 to 3.86 today with IVF -renal following  I have spent a total of 35 minutes with patient reviewing 2D echo from January, hospital notes including nephrology consult , telemetry, EKGs, labs and examining patient as well as establishing an assessment and plan that was discussed with the patient.  > 50% of time was spent in direct patient care.       For questions or updates, please contact [place] Please consult [email or web address] for contact info under        Signed, Fransico Him, MD  03/19/2021, 9:48 AM

## 2021-03-19 NOTE — Progress Notes (Addendum)
Mobility Specialist - Progress Note   03/19/21 1329  Mobility  Activity Ambulated in hall  Level of Assistance Standby assist, set-up cues, supervision of patient - no hands on  Assistive Device Front wheel walker  Distance Ambulated (ft) 50 ft  Mobility Ambulated with assistance in hallway  Mobility Response Tolerated well  Mobility performed by Mobility specialist  $Mobility charge 1 Mobility   Pre-mobility: 115 HR, 97% SpO2 During mobility: 151 HR Post-mobility: 113 HR, 96% SpO2  Pt seen at request of RN. Distance limited by tachycardia. Pt endorsed feeling SOB when returning to her room, but was otherwise asx. Pt back in bed after walk, call bell at side, and bed alarm on.   Pricilla Handler Mobility Specialist Mobility Specialist Phone: [phone]

## 2021-03-19 NOTE — NC FL2 (Signed)
[place] LEVEL OF CARE SCREENING TOOL     IDENTIFICATION  Patient Name: Heather Saunders Birthdate: [birth year] Sex: female Admission Date (Current Location): 03/17/2021  [facility] and [place] Number:  Herbalist and Address:  [facility], [place] [address] [place], [place] [address]      Provider Number: [id number]  Attending Physician Name and Address:  Tawni Millers,*  Relative Name and Phone Number:  Verlin Grills, [phone]    Current Level of Care: Hospital Recommended Level of Care: [place] Prior Approval Number:    Date Approved/Denied:   PASRR Number: [id number]  Discharge Plan: SNF    Current Diagnoses: Patient Active Problem List   Diagnosis Date Noted   Atrial fibrillation with RVR ([place]) 03/18/2021   AKI (acute kidney injury) ([place]) 03/18/2021   Essential hypertension 08/03/2018   Coronary artery disease involving native coronary artery of native heart without angina pectoris 08/03/2018   Dyspnea on exertion 08/03/2018   Mixed hyperlipidemia 08/03/2018    Orientation RESPIRATION BLADDER Height & Weight     Self, Time, Situation, Place  Normal Incontinent, External catheter Weight: 116 lb 13.5 oz (53 kg) Height:  '5\' 4"'$  (162.6 cm)  BEHAVIORAL SYMPTOMS/MOOD NEUROLOGICAL BOWEL NUTRITION STATUS      Continent Diet (See DC summary)  AMBULATORY STATUS COMMUNICATION OF NEEDS Skin   Extensive Assist Verbally Skin abrasions (Ecchymosis, bilateral back and arm)                       Personal Care Assistance Level of Assistance  Bathing, Feeding, Dressing Bathing Assistance: Limited assistance Feeding assistance: Independent Dressing Assistance: Limited assistance     Functional Limitations Info  Sight, Hearing, Speech Sight Info: Impaired Hearing Info: Impaired Speech Info: Adequate    SPECIAL CARE FACTORS FREQUENCY  PT (By licensed PT), OT (By licensed OT)     PT  Frequency: 5x week OT Frequency: 5x week            Contractures Contractures Info: Not present    Additional Factors Info  Code Status, Allergies Code Status Info: Full Allergies Info: Boniva, Fosamax           Current Medications (03/19/2021):  This is the current hospital active medication list Current Facility-Administered Medications  Medication Dose Route Frequency Provider Last Rate Last Admin   0.9 %  sodium chloride infusion   Intravenous Continuous Roney Jaffe, MD 75 mL/hr at 03/19/21 0647 New Bag at 03/19/21 0647   0.9 %  sodium chloride infusion   Intravenous Continuous Bhagat, Bhavinkumar, PA       acetaminophen (TYLENOL) tablet 650 mg  650 mg Oral Q6H PRN Irene Pap N, DO       atorvastatin (LIPITOR) tablet 40 mg  40 mg Oral Daily Irene Pap N, DO   40 mg at 03/19/21 [id number]   ezetimibe (ZETIA) tablet 10 mg  10 mg Oral Daily Irene Pap N, DO   10 mg at 03/19/21 0919   heparin ADULT infusion 100 units/mL (25000 units/232m)  800 Units/hr Intravenous Continuous ATawni Millers MD 8 mL/hr at 03/19/21 1401 800 Units/hr at 03/19/21 1401   melatonin tablet 3 mg  3 mg Oral QHS PRN HIrene PapN, DO       metoprolol tartrate (LOPRESSOR) injection 5 mg  5 mg Intravenous Q4H PRN Opyd, TIlene Qua MD   5 mg at 03/19/21 0237   metoprolol tartrate (LOPRESSOR) tablet  50 mg  50 mg Oral BID Sueanne Margarita, MD       multivitamin with minerals tablet 1 tablet  1 tablet Oral Daily Irene Pap N, DO   1 tablet at 03/19/21 0919   polyethylene glycol (MIRALAX / GLYCOLAX) packet 17 g  17 g Oral Daily PRN Irene Pap N, DO       prochlorperazine (COMPAZINE) injection 5 mg  5 mg Intravenous Q6H PRN Kayleen Memos, DO         Discharge Medications: Please see discharge summary for a list of discharge medications.  Relevant Imaging Results:  Relevant Lab Results:   Additional Information SS# [place], LCSWA

## 2021-03-20 ENCOUNTER — Inpatient Hospital Stay (HOSPITAL_COMMUNITY): Payer: Medicare Other | Admitting: Anesthesiology

## 2021-03-20 ENCOUNTER — Encounter (HOSPITAL_COMMUNITY)
Admission: EM | Disposition: A | Discharge status: Skilled Nursing Facility | Payer: Self-pay | Source: Home / Self Care | Attending: Internal Medicine

## 2021-03-20 ENCOUNTER — Inpatient Hospital Stay (HOSPITAL_COMMUNITY): Payer: Medicare Other

## 2021-03-20 ENCOUNTER — Encounter (HOSPITAL_COMMUNITY): Payer: Self-pay | Admitting: Internal Medicine

## 2021-03-20 DIAGNOSIS — I340 Nonrheumatic mitral (valve) insufficiency: Secondary | ICD-10-CM

## 2021-03-20 DIAGNOSIS — I10 Essential (primary) hypertension: Secondary | ICD-10-CM | POA: Diagnosis not present

## 2021-03-20 DIAGNOSIS — I5189 Other ill-defined heart diseases: Secondary | ICD-10-CM

## 2021-03-20 DIAGNOSIS — I371 Nonrheumatic pulmonary valve insufficiency: Secondary | ICD-10-CM

## 2021-03-20 DIAGNOSIS — I272 Pulmonary hypertension, unspecified: Secondary | ICD-10-CM | POA: Diagnosis not present

## 2021-03-20 DIAGNOSIS — I5031 Acute diastolic (congestive) heart failure: Secondary | ICD-10-CM | POA: Diagnosis present

## 2021-03-20 DIAGNOSIS — I4891 Unspecified atrial fibrillation: Secondary | ICD-10-CM | POA: Diagnosis not present

## 2021-03-20 DIAGNOSIS — N179 Acute kidney failure, unspecified: Secondary | ICD-10-CM

## 2021-03-20 HISTORY — PX: TEE WITHOUT CARDIOVERSION: SHX5443

## 2021-03-20 LAB — CBC
HCT: 33.7 % — ABNORMAL LOW (ref 36.0–46.0)
Hemoglobin: 10.7 g/dL — ABNORMAL LOW (ref 12.0–15.0)
MCH: 27.4 pg (ref 26.0–34.0)
MCHC: 31.8 g/dL (ref 30.0–36.0)
MCV: 86.2 fL (ref 80.0–100.0)
Platelets: 254 10*3/uL (ref 150–400)
RBC: 3.91 MIL/uL (ref 3.87–5.11)
RDW: 15.5 % (ref 11.5–15.5)
WBC: 15.8 10*3/uL — ABNORMAL HIGH (ref 4.0–10.5)
nRBC: 0 % (ref 0.0–0.2)

## 2021-03-20 LAB — RENAL FUNCTION PANEL
Albumin: 2.9 g/dL — ABNORMAL LOW (ref 3.5–5.0)
Anion gap: 11 (ref 5–15)
BUN: 67 mg/dL — ABNORMAL HIGH (ref 8–23)
CO2: 19 mmol/L — ABNORMAL LOW (ref 22–32)
Calcium: 8.6 mg/dL — ABNORMAL LOW (ref 8.9–10.3)
Chloride: 110 mmol/L (ref 98–111)
Creatinine, Ser: 2.91 mg/dL — ABNORMAL HIGH (ref 0.44–1.00)
GFR, Estimated: 14 mL/min — ABNORMAL LOW (ref >60)
Glucose, Bld: 111 mg/dL — ABNORMAL HIGH (ref 70–99)
Phosphorus: 2.6 mg/dL (ref 2.5–4.6)
Potassium: 4 mmol/L (ref 3.5–5.1)
Sodium: 140 mmol/L (ref 135–145)

## 2021-03-20 LAB — ECHO TEE
AV Peak grad: 10 mmHg
Ao pk vel: 1.58 m/s

## 2021-03-20 LAB — HEPARIN LEVEL (UNFRACTIONATED): Heparin Unfractionated: 0.56 IU/mL (ref 0.30–0.70)

## 2021-03-20 SURGERY — ECHOCARDIOGRAM, TRANSESOPHAGEAL
Anesthesia: General

## 2021-03-20 MED ORDER — PHENYLEPHRINE HCL (PRESSORS) 10 MG/ML IV SOLN
INTRAVENOUS | Status: DC | PRN
  Administered 2021-03-20: 80 ug via INTRAVENOUS
  Administered 2021-03-20: 120 ug via INTRAVENOUS

## 2021-03-20 MED ORDER — SODIUM BICARBONATE 650 MG PO TABS
650.0000 mg | ORAL_TABLET | Freq: Two times a day (BID) | ORAL | Status: DC
  Administered 2021-03-20 – 2021-03-21 (×4): 650 mg via ORAL
  Filled 2021-03-20 (×5): qty 1

## 2021-03-20 MED ORDER — METOPROLOL TARTRATE 50 MG PO TABS
75.0000 mg | ORAL_TABLET | Freq: Two times a day (BID) | ORAL | Status: DC
  Administered 2021-03-20 – 2021-03-21 (×2): 75 mg via ORAL
  Filled 2021-03-20 (×2): qty 1

## 2021-03-20 MED ORDER — PROPOFOL 10 MG/ML IV BOLUS: INTRAVENOUS | Status: DC | PRN

## 2021-03-20 MED ORDER — SODIUM CHLORIDE 0.9 % IV SOLN: INTRAVENOUS | Status: DC | PRN

## 2021-03-20 MED ORDER — PROPOFOL 500 MG/50ML IV EMUL
INTRAVENOUS | Status: DC | PRN
  Administered 2021-03-20: 100 ug/kg/min via INTRAVENOUS

## 2021-03-20 NOTE — Anesthesia Postprocedure Evaluation (Signed)
    Anesthesia Post Note  Patient: Heather Saunders  Procedure(s) Performed: TRANSESOPHAGEAL ECHOCARDIOGRAM (TEE)     Patient location during evaluation: PACU Anesthesia Type: MAC Level of consciousness: awake and alert Pain management: pain level controlled Vital Signs Assessment: post-procedure vital signs reviewed and stable Respiratory status: spontaneous breathing, nonlabored ventilation, respiratory function stable and patient connected to nasal cannula oxygen Cardiovascular status: blood pressure returned to baseline and stable Postop Assessment: no apparent nausea or vomiting Anesthetic complications: no   No notable events documented.  Last Vitals:  Vitals:   03/20/21 0832 03/20/21 0930  BP: (!) 169/70 (!) 142/80  Pulse: 89 99  Resp: (!) 26   Temp:  36.5 C  SpO2: 91%     Last Pain:  Vitals:   03/20/21 0930  TempSrc: Oral  PainSc: 0-No pain                 Kelan Pritt

## 2021-03-20 NOTE — Progress Notes (Addendum)
ANTICOAGULATION CONSULT NOTE  Pharmacy Consult for heparin Indication: atrial fibrillation  Allergies  Allergen Reactions   Boniva [Ibandronic Acid]     Sore throat   Fosamax [Alendronate Sodium]     Sore throat    Patient Measurements: Height: '5\' 4"'$  (162.6 cm) Weight: 53.5 kg (117 lb 15.1 oz) IBW/kg (Calculated) : 54.7 Heparin Dosing Weight: 53 kg  Vital Signs: Temp: 97.7 F (36.5 C) (06/21 0812) Temp Source: Axillary (06/21 0812) BP: 169/70 (06/21 0832) Pulse Rate: 89 (06/21 0832)  Labs: Recent Labs    03/17/21 2333 03/18/21 0037 03/18/21 0500 03/19/21 0309 03/19/21 1813 03/19/21 1938 03/20/21 0007  HGB  --   --  10.4* 11.3*  --   --  10.7*  HCT  --   --  32.6* 34.6*  --   --  33.7*  PLT  --   --  203 220  --   --  254  LABPROT  --   --   --   --  14.0  --   --   INR  --   --   --   --  1.1  --   --   HEPARINUNFRC  --   --   --   --   --  0.48 0.56  CREATININE  --  5.05* 4.76* 3.86*  --   --  2.91*  TROPONINIHS 53* 51*  --   --   --   --   --      Estimated Creatinine Clearance: 10 mL/min (A) (by C-G formula based on SCr of 2.91 mg/dL (H)).   Medical History: Past Medical History:  Diagnosis Date   Chronic kidney disease    Coronary disease    NonObstructive   History of nuclear stress test    Nuclear stress test 10/18: EF 96, normal perfusion, low risk   Hyperlipidemia    Hypertension    Myocardial infarct [facility]) 2006   Osteopenia    Osteoporosis     Medications:  Medications Prior to Admission  Medication Sig Dispense Refill Last Dose   acetaminophen (TYLENOL) 500 MG tablet Take 500 mg by mouth every 6 (six) hours as needed for mild pain.   03/17/2021   amLODipine (NORVASC) 5 MG tablet TAKE 1 TABLET BY MOUTH TWICE A DAY (Patient taking differently: Take 5 mg by mouth in the morning and at bedtime.) 180 tablet 1 03/16/2021   aspirin 81 MG tablet Take 81 mg by mouth daily.   03/16/2021   atorvastatin (LIPITOR) 40 MG tablet Take 40 mg by mouth  daily.   03/16/2021   Cholecalciferol 100 MCG (4000 UT) CAPS Take 4,000 Units by mouth daily.   03/16/2021   ezetimibe (ZETIA) 10 MG tablet TAKE 1 TABLET BY MOUTH EVERY DAY (Patient taking differently: Take 10 mg by mouth daily.) 90 tablet 3 03/16/2021   hydrALAZINE (APRESOLINE) 25 MG tablet Take 25 mg by mouth in the morning, at noon, and at bedtime. With food   03/17/2021   metoprolol tartrate (LOPRESSOR) 50 MG tablet Take 50 mg by mouth 2 (two) times daily. With food   03/17/2021 at 1000   Multiple Vitamin (MULTIVITAMIN) capsule Take 1 capsule by mouth daily.   Past Month   polyethylene glycol (MIRALAX MIX-IN PAX) 17 g packet Take 17 g by mouth daily as needed for mild constipation.   Past Week    Assessment: 87 y.o. female with new onset AF/RVR. Pharmacy consulted to start heparin. Of note, patient CKD  3 at baseline with acute AKI. Not on anticoagulation PTA.  Heparin level came back therapeutic at 0.56, on 800 units/hr. Hgb 10.7, plt 254. No s/sx of bleeding or infusion issues.   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   Goal of Therapy:  Heparin level 0.3-0.7 units/ml Monitor platelets by anticoagulation protocol: Yes   Plan:  Continue heparin infusion at 800 units/hr Check anti-Xa level daily while on heparin Continue to monitor H&H and platelets F/u transition to oral [facility]  Thank you for allowing [place] to participate in this patients care.  Antonietta Jewel, PharmD, [place] Clinical Pharmacist  Phone: [phone] 03/20/2021 9:20 AM  Please check AMION for all [place] phone numbers After 10:00 PM, call [place] [phone]

## 2021-03-20 NOTE — Progress Notes (Signed)
[facility] ROUNDING NOTE   Subjective:   Interval History: 87 year old with history of coronary disease hypertension dyslipidemia was found to be in atrial fibrillation rapid ventricular rate.  She has a history of chronic disease with baseline creatinine 1.6 to 1.9 mg/dL.  She was recently started on lisinopril by primary physician for hypertension.  She is felt to be volume depleted.  Creatinine 5 mg deciliter on admit.  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  Suspect thrombus in setting of Afib, would also check blood cultures to evaluate for endocarditis.  Cardioversion deferred in setting of suspected RA thrombus.   Blood pressure 145/79 pulse 92 temperature 97.7 O2 sats 92%  Urine output 350 cc recorded 03/19/2021  Sodium 140 potassium 4 chloride 110 CO2 19 BUN 67 creatinine 2.91 glucose 111 calcium 8.6 albumin 2.9 hemoglobin 10.7  Objective:  Vital signs in last 24 hours:  Temp:  [97.4 F (36.3 C)-98.7 F (37.1 C)] 97.7 F (36.5 C) (06/21 0930) Pulse Rate:  [89-116] 99 (06/21 0930) Resp:  [16-29] 26 (06/21 0832) BP: (112-169)/(44-86) 142/80 (06/21 0930) SpO2:  [91 %-97 %] 91 % (06/21 0832) Weight:  [53.5 kg] 53.5 kg (06/21 0356)  Weight change: 0.5 kg Filed Weights   03/18/21 1635 03/19/21 0326 03/20/21 0356  Weight: 53 kg 53 kg 53.5 kg    Intake/Output: I/O last 3 completed shifts: In: 1752.4 [I.V.:1752.4] Out: 350 [Urine:350]   Intake/Output this shift:  Total I/O In: 640 [P.O.:240; I.V.:400] Out: -   Gen alert, no distress No rash, cyanosis or gangrene Sclera anicteric, throat clear No jvd or bruits Chest clear bilat to bases, no rales/ wheezing RRR no MRG Abd soft ntnd no mass or ascites +bs GU deferred MS no joint effusions or deformity Ext no LE or UE edema, no wounds or ulcers Neuro is alert, Ox 3 , nf   Basic Metabolic Panel: Recent Labs  Lab 03/17/21 2315 03/18/21 0037 03/18/21 0500 03/19/21 0309  03/20/21 0007  NA 135 135 136 137 140  K 4.3 4.6 4.7 4.3 4.0  CL 107 107 106 107 110  CO2 14* 14* 17* 17* 19*  GLUCOSE 118* 122* 106* 120* 111*  BUN 80* 77* 79* 77* 67*  CREATININE 5.04* 5.05* 4.76* 3.86* 2.91*  CALCIUM 8.6* 8.8* 8.6* 8.5* 8.6*  MG 2.0  --  2.2  --   --   PHOS  --   --  3.6 2.9 2.6     Liver Function Tests: Recent Labs  Lab 03/18/21 0500 03/18/21 0808 03/19/21 0309 03/20/21 0007  AST  --  28  --   --   ALT  --  37  --   --   ALKPHOS  --  77  --   --   BILITOT  --  1.3*  --   --   PROT  --  5.3*  --   --   ALBUMIN 3.0* 2.9* 2.8* 2.9*    No results for input(s): LIPASE, AMYLASE in the last 168 hours. No results for input(s): AMMONIA in the last 168 hours.  CBC: Recent Labs  Lab 03/17/21 2315 03/18/21 0500 03/19/21 0309 03/20/21 0007  WBC 13.4* 12.3* 13.7* 15.8*  HGB 10.8* 10.4* 11.3* 10.7*  HCT 33.9* 32.6* 34.6* 33.7*  MCV 87.8 87.2 86.1 86.2  PLT 194 203 220 254     Cardiac Enzymes: No results for input(s): CKTOTAL, CKMB, CKMBINDEX, TROPONINI in the last 168 hours.  BNP: Invalid input(s): POCBNP  CBG:  No results for input(s): GLUCAP in the last 168 hours.  Microbiology: Results for orders placed or performed during the hospital encounter of 03/17/21  Resp Panel by RT-PCR (Flu A&B, Covid) Nasopharyngeal Swab     Status: None   Collection Time: 03/17/21 11:33 PM   Specimen: Nasopharyngeal Swab; Nasopharyngeal(NP) swabs in vial transport medium  Result Value Ref Range Status   SARS Coronavirus 2 by RT PCR NEGATIVE NEGATIVE Final    Comment: (NOTE) SARS-CoV-2 target nucleic acids are NOT DETECTED.  The SARS-CoV-2 RNA is generally detectable in upper respiratory specimens during the acute phase of infection. The lowest concentration of SARS-CoV-2 viral copies this assay can detect is 138 copies/mL. A negative result does not preclude SARS-Cov-2 infection and should not be used as the sole basis for treatment or other patient  management decisions. A negative result may occur with  improper specimen collection/handling, submission of specimen other than nasopharyngeal swab, presence of viral mutation(s) within the areas targeted by this assay, and inadequate number of viral copies(<138 copies/mL). A negative result must be combined with clinical observations, patient history, and epidemiological information. The expected result is Negative.  Fact Sheet for Patients:  EntrepreneurPulse.com.au  Fact Sheet for Healthcare Providers:  IncredibleEmployment.be  This test is no t yet approved or cleared by the [place] FDA and  has been authorized for detection and/or diagnosis of SARS-CoV-2 by FDA under an Emergency Use Authorization (EUA). This EUA will remain  in effect (meaning this test can be used) for the duration of the COVID-19 declaration under Section 564(b)(1) of the Act, 21 U.S.C.section 360bbb-3(b)(1), unless the authorization is terminated  or revoked sooner.       Influenza A by PCR NEGATIVE NEGATIVE Final   Influenza B by PCR NEGATIVE NEGATIVE Final    Comment: (NOTE) The Xpert Xpress SARS-CoV-2/FLU/RSV plus assay is intended as an aid in the diagnosis of influenza from Nasopharyngeal swab specimens and should not be used as a sole basis for treatment. Nasal washings and aspirates are unacceptable for Xpert Xpress SARS-CoV-2/FLU/RSV testing.  Fact Sheet for Patients: EntrepreneurPulse.com.au  Fact Sheet for Healthcare Providers: IncredibleEmployment.be  This test is not yet approved or cleared by the [place] FDA and has been authorized for detection and/or diagnosis of SARS-CoV-2 by FDA under an Emergency Use Authorization (EUA). This EUA will remain in effect (meaning this test can be used) for the duration of the COVID-19 declaration under Section 564(b)(1) of the Act, 21 U.S.C. section 360bbb-3(b)(1),  unless the authorization is terminated or revoked.  Performed at [facility], [place] [address], [place], [place] [address]     Coagulation Studies: Recent Labs    03/19/21 1813  LABPROT 14.0  INR 1.1    Urinalysis: Recent Labs    03/18/21 0720  COLORURINE YELLOW  LABSPEC 1.018  PHURINE 5.0  GLUCOSEU NEGATIVE  HGBUR NEGATIVE  BILIRUBINUR NEGATIVE  KETONESUR 5*  PROTEINUR 100*  NITRITE NEGATIVE  LEUKOCYTESUR NEGATIVE       Imaging: DG CHEST PORT 1 VIEW  Result Date: 03/19/2021 CLINICAL DATA:  Atrial fibrillation. EXAM: PORTABLE CHEST 1 VIEW COMPARISON:  03/17/2021 FINDINGS: Cardiomegaly, pulmonary vascular congestion and mild interstitial opacities are again identified. Mild bibasilar opacities likely representing atelectasis noted. No pneumothorax or large pleural effusion identified. No other significant change. IMPRESSION: Cardiomegaly with pulmonary vascular congestion and probable mild interstitial edema. Probable mild bibasilar atelectasis. Electronically Signed   By: Margarette Canada M.D.   On: 03/19/2021 08:45   ECHOCARDIOGRAM  COMPLETE  Result Date: 03/19/2021    ECHOCARDIOGRAM REPORT   Patient Name:   Heather Saunders Date of Exam: 03/19/2021 Medical Rec #:  [id number]        Height:       64.0 in Accession #:    [id number]       Weight:       116.8 lb Date of Birth:  [birth year]        BSA:          1.557 m Patient Age:    71 years         BP:           150/75 mmHg Patient Gender: F                HR:           107 bpm. Exam Location:  Inpatient Procedure: 2D Echo, Cardiac Doppler and Color Doppler Indications:    Atrial fibrillation                 Elevated troponin  History:        Patient has prior history of Echocardiogram examinations, most                 recent 10/29/2020. Previous Myocardial Infarction and CAD; Risk                 Factors:Dyslipidemia. CKD.  Sonographer:    Clayton Lefort RDCS (AE) Referring Phys: [id number] [place]  1. Left  ventricular ejection fraction, by estimation, is 60 to 65%. The left ventricle has normal function. The left ventricle has no regional wall motion abnormalities. There is severe left ventricular hypertrophy. Left ventricular diastolic function could not be evaluated.  2. Right ventricular systolic function is normal. The right ventricular size is normal. There is severely elevated pulmonary artery systolic pressure. The estimated right ventricular systolic pressure is [id number] mmHg.  3. Left atrial size was moderately dilated.  4. Mobile filamentous structure, which appears attached to the interatrial septum - consider thrombus or possible myxoma (image 67/68) - measuring ~0.6 x 2.0 cm. Right atrial size was mildly dilated.  5. The mitral valve is abnormal. Mild mitral valve regurgitation.  6. The aortic valve is tricuspid. Aortic valve regurgitation is mild. Mild aortic valve sclerosis is present, with no evidence of aortic valve stenosis.  7. The inferior vena cava is dilated in size with >50% respiratory variability, suggesting right atrial pressure of 8 mmHg. Comparison(s): Changes from prior study are noted. 10/29/2020: LVEF 60-65%, mobile RA mass can be seen but not noted in this study. FINDINGS  Left Ventricle: Left ventricular ejection fraction, by estimation, is 60 to 65%. The left ventricle has normal function. The left ventricle has no regional wall motion abnormalities. The left ventricular internal cavity size was normal in size. There is  severe left ventricular hypertrophy. Left ventricular diastolic function could not be evaluated due to atrial fibrillation. Left ventricular diastolic function could not be evaluated. Right Ventricle: The right ventricular size is normal. No increase in right ventricular wall thickness. Right ventricular systolic function is normal. There is severely elevated pulmonary artery systolic pressure. The tricuspid regurgitant velocity is 3.63 m/s, and with an assumed right  atrial pressure of 8 mmHg, the estimated right ventricular systolic pressure is [id number] mmHg. Left Atrium: Left atrial size was moderately dilated. Right Atrium: Mobile filamentous structure, which appears attached to the interatrial septum - consider thrombus or possible myxoma - measuring ~  0.6 x 2.0 cm. Right atrial size was mildly dilated. Pericardium: There is no evidence of pericardial effusion. Mitral Valve: The mitral valve is abnormal. There is mild thickening of the mitral valve leaflet(s). There is mild calcification of the mitral valve leaflet(s). Mild mitral valve regurgitation. Tricuspid Valve: The tricuspid valve is grossly normal. Tricuspid valve regurgitation is trivial. Aortic Valve: The aortic valve is tricuspid. Aortic valve regurgitation is mild. Aortic regurgitation PHT measures 676 msec. Mild aortic valve sclerosis is present, with no evidence of aortic valve stenosis. Aortic valve mean gradient measures 3.0 mmHg. Aortic valve peak gradient measures 4.9 mmHg. Aortic valve area, by VTI measures 4.19 cm. Pulmonic Valve: The pulmonic valve was normal in structure. Pulmonic valve regurgitation is not visualized. Aorta: The aortic root and ascending aorta are structurally normal, with no evidence of dilitation. Venous: The inferior vena cava is dilated in size with greater than 50% respiratory variability, suggesting right atrial pressure of 8 mmHg. IAS/Shunts: No atrial level shunt detected by color flow Doppler.  LEFT VENTRICLE PLAX 2D LVIDd:         2.90 cm LVIDs:         2.40 cm LV PW:         1.60 cm LV IVS:        1.80 cm LVOT diam:     2.10 cm LV SV:         74 LV SV Index:   47 LVOT Area:     3.46 cm  RIGHT VENTRICLE             IVC RV S prime:     10.20 cm/s  IVC diam: 2.50 cm TAPSE (M-mode): 1.5 cm LEFT ATRIUM             Index       RIGHT ATRIUM           Index LA diam:        3.40 cm 2.18 cm/m  RA Area:     10.70 cm LA Vol (A2C):   43.5 ml 27.94 ml/m RA Volume:   20.60 ml  13.23 ml/m  LA Vol (A4C):   76.7 ml 49.27 ml/m LA Biplane Vol: 62.9 ml 40.40 ml/m  AORTIC VALVE AV Area (Vmax):    3.63 cm AV Area (Vmean):   3.40 cm AV Area (VTI):     4.19 cm AV Vmax:           110.80 cm/s AV Vmean:          81.600 cm/s AV VTI:            0.176 m AV Peak Grad:      4.9 mmHg AV Mean Grad:      3.0 mmHg LVOT Vmax:         116.20 cm/s LVOT Vmean:        80.120 cm/s LVOT VTI:          0.213 m LVOT/AV VTI ratio: 1.21 AI PHT:            676 msec  AORTA Ao Root diam: 3.50 cm Ao Asc diam:  3.40 cm TRICUSPID VALVE TR Peak grad:   52.7 mmHg TR Vmax:        363.00 cm/s  SHUNTS Systemic VTI:  0.21 m Systemic Diam: 2.10 cm Lyman Bishop MD Electronically signed by Lyman Bishop MD Signature Date/Time: 03/19/2021/6:09:56 PM    Final    ECHO TEE  Result Date: 03/20/2021  TRANSESOPHOGEAL ECHO REPORT   Patient Name:   Heather Saunders Date of Exam: 03/20/2021 Medical Rec #:  [id number]        Height:       64.0 in Accession #:    [id number]       Weight:       117.9 lb Date of Birth:  01-Nov-1924        BSA:          1.563 m Patient Age:    82 years         BP:           132/57 mmHg Patient Gender: F                HR:           93 bpm. Exam Location:  Inpatient Procedure: Transesophageal Echo, Cardiac Doppler and Color Doppler Indications:     Atrial fibrillation  History:         Patient has prior history of Echocardiogram examinations, most                  recent 03/19/2021. CAD, Arrythmias:Atrial Fibrillation,                  Signs/Symptoms:CKD; Risk Factors:Hypertension.  Sonographer:     Dustin Flock Referring Phys:  [id number] Leanor Kail Diagnosing Phys: Oswaldo Milian MD PROCEDURE: The transesophogeal probe was passed without difficulty through the esophogus of the patient. Sedation performed by different physician. The patient was monitored while under deep sedation. Anesthestetic sedation was provided intravenously by Anesthesiology: 152.'48mg'$  of Propofol. The patient developed no  complications during the procedure. IMPRESSIONS  1. Mobile filamentous mass measuring 1.7cm x 0.6cm in the right atrium, appears attached to eustachian valve and extends to the interatrial septum. Suspect likely thrombus but would also recommend checking blood cultures to evaluate for endocarditis. Cardioversion deferred given suspected RA thrombus  2. Left ventricular ejection fraction, by estimation, is 60 to 65%. The left ventricle has normal function. There is severe left ventricular hypertrophy.  3. Right ventricular systolic function is normal. The right ventricular size is normal.  4. Left atrial size was mildly dilated. No left atrial/left atrial appendage thrombus was detected.  5. The mitral valve is normal in structure. Mild mitral valve regurgitation.  6. The aortic valve is tricuspid. Aortic valve regurgitation is mild. Mild aortic valve sclerosis is present, with no evidence of aortic valve stenosis.  7. Pulmonic valve regurgitation is mild to moderate. FINDINGS  Left Ventricle: Left ventricular ejection fraction, by estimation, is 60 to 65%. The left ventricle has normal function. The left ventricular internal cavity size was small. There is severe left ventricular hypertrophy. Right Ventricle: The right ventricular size is normal. No increase in right ventricular wall thickness. Right ventricular systolic function is normal. Left Atrium: Left atrial size was mildly dilated. No left atrial/left atrial appendage thrombus was detected. Right Atrium: Right atrial size was normal in size. Pericardium: There is no evidence of pericardial effusion. Mitral Valve: The mitral valve is normal in structure. Mild mitral valve regurgitation. Tricuspid Valve: The tricuspid valve is normal in structure. Tricuspid valve regurgitation is mild. Aortic Valve: The aortic valve is tricuspid. Aortic valve regurgitation is mild. Mild aortic valve sclerosis is present, with no evidence of aortic valve stenosis. Aortic valve  peak gradient measures 10.0 mmHg. Pulmonic Valve: The pulmonic valve was grossly normal. Pulmonic valve regurgitation is moderate. Aorta: The aortic root and ascending aorta are structurally  normal, with no evidence of dilitation. IAS/Shunts: No atrial level shunt detected by color flow Doppler.  AORTIC VALVE AV Vmax:      158.00 cm/s AV Peak Grad: 10.0 mmHg Oswaldo Milian MD Electronically signed by Oswaldo Milian MD Signature Date/Time: 03/20/2021/10:59:34 AM    Final      Medications:    heparin 800 Units/hr (03/20/21 0735)    atorvastatin  40 mg Oral Daily   ezetimibe  10 mg Oral Daily   metoprolol tartrate  50 mg Oral BID   multivitamin with minerals  1 tablet Oral Daily   acetaminophen, melatonin, metoprolol tartrate, polyethylene glycol, prochlorperazine  Assessment/ Plan:  Acute kidney injury Baseline serum creatinine about 2 mg/dL.  Creatinine appears to be improving.  Urine output present.  We will continue to follow.  Urinalysis bland.   .  Continue to avoid nonsteroidal steroidal anti-inflammatory drugs as well as ACE inhibitor's and ARB's.  Lisinopril held.  Renal function appears to be improving slowly. Metabolic acidosis continue oral sodium bicarbonate Atrial fibrillation rate control per cardiology Hypertension stable. Atrial thrombus IV heparin started per primary service   LOS: 2 Sherril Croon '@TODAY''@11'$ :04 AM

## 2021-03-20 NOTE — CV Procedure (Signed)
     TRANSESOPHAGEAL ECHOCARDIOGRAM   NAME:  Heather Saunders   MRN: [id number] DOB:  [birth year]   ADMIT DATE: 03/17/2021  INDICATIONS: Afib  PROCEDURE:   Informed consent was obtained prior to the procedure. The risks, benefits and alternatives for the procedure were discussed and the patient comprehended these risks.  Risks include, but are not limited to, cough, sore throat, vomiting, nausea, somnolence, esophageal and stomach trauma or perforation, bleeding, low blood pressure, aspiration, pneumonia, infection, trauma to the teeth and death.    After a procedural time-out, the oropharynx was anesthetized and the patient was sedated by the anesthesia service. The transesophageal probe was inserted in the esophagus and stomach without difficulty and multiple views were obtained. Anesthesia was monitored by Haze Boyden, CRNA and Dr Ambrose Pancoast   COMPLICATIONS:    There were no immediate complications.  FINDINGS:  Mobile right atrial mass attached to eustachian valve.  Suspect thrombus in setting of Afib, would also check blood cultures to evaluate for endocarditis.  Cardioversion deferred in setting of suspected RA thrombus.  D/w Dr. Radford Pax.  Oswaldo Milian MD [facility]  [address] [place], [place] [address] [phone]   8:29 AM

## 2021-03-20 NOTE — Progress Notes (Signed)
  Echocardiogram Echocardiogram Transesophageal has been performed.  Heather Saunders 03/20/2021, 8:24 AM

## 2021-03-20 NOTE — Progress Notes (Signed)
Progress Note  Patient Name: Heather Saunders Date of Encounter: 03/20/2021  [facility] Cardiologist: Dorris Carnes, MD   Subjective   Just back from TEE showing mass in the right atrium that appeared attached to the eustachian valve c/w thrombus and normal LVF.  HR improved today in afib to the low 100's  Inpatient Medications    Scheduled Meds:  atorvastatin  40 mg Oral Daily   ezetimibe  10 mg Oral Daily   metoprolol tartrate  50 mg Oral BID   multivitamin with minerals  1 tablet Oral Daily   sodium bicarbonate  650 mg Oral BID   Continuous Infusions:  heparin 800 Units/hr (03/20/21 1856)   PRN Meds: acetaminophen, melatonin, metoprolol tartrate, polyethylene glycol, prochlorperazine   Vital Signs    Vitals:   03/20/21 0930 03/20/21 1000 03/20/21 1200 03/20/21 2033  BP: (!) 142/80 (!) 153/80 (!) 164/90 (!) 164/89  Pulse: 99 60 99 (!) 103  Resp:  20 (!) 23 20  Temp: 97.7 F (36.5 C)  97.9 F (36.6 C) 98.5 F (36.9 C)  TempSrc: Oral  Oral   SpO2:  95% 96% 100%  Weight:      Height:        Intake/Output Summary (Last 24 hours) at 03/20/2021 2109 Last data filed at 03/20/2021 1836 Gross per 24 hour  Intake 1368.83 ml  Output --  Net 1368.83 ml    Last 3 Weights 03/20/2021 03/19/2021 03/18/2021  Weight (lbs) 117 lb 15.1 oz 116 lb 13.5 oz 116 lb 13.5 oz  Weight (kg) 53.5 kg 53 kg 53 kg      Telemetry    Atrial Fibrillation with HR 100bpm - Personally Reviewed  ECG    No new EKG to review - Personally Reviewed  Physical Exam   GEN: thin and ill appearing HEENT: Normal NECK: No JVD; No carotid bruits LYMPHATICS: No lymphadenopathy CARDIAC:irregularly irregular no murmurs, rubs, gallops RESPIRATORY:  Clear to auscultation without rales, wheezing or rhonchi  ABDOMEN: Soft, non-tender, non-distended MUSCULOSKELETAL:  No edema; No deformity  SKIN: Warm and dry NEUROLOGIC:  Alert and oriented x 3 PSYCHIATRIC:  Normal affect   Labs    High  Sensitivity Troponin:   Recent Labs  Lab 03/17/21 2333 03/18/21 0037  TROPONINIHS 53* 51*       Chemistry Recent Labs  Lab 03/18/21 0500 03/18/21 0808 03/19/21 0309 03/20/21 0007  NA 136  --  137 140  K 4.7  --  4.3 4.0  CL 106  --  107 110  CO2 17*  --  17* 19*  GLUCOSE 106*  --  120* 111*  BUN 79*  --  77* 67*  CREATININE 4.76*  --  3.86* 2.91*  CALCIUM 8.6*  --  8.5* 8.6*  PROT  --  5.3*  --   --   ALBUMIN 3.0* 2.9* 2.8* 2.9*  AST  --  28  --   --   ALT  --  37  --   --   ALKPHOS  --  77  --   --   BILITOT  --  1.3*  --   --   GFRNONAA 8*  --  10* 14*  ANIONGAP 13  --  13 11      Hematology Recent Labs  Lab 03/18/21 0500 03/19/21 0309 03/20/21 0007  WBC 12.3* 13.7* 15.8*  RBC 3.74* 4.02 3.91  HGB 10.4* 11.3* 10.7*  HCT 32.6* 34.6* 33.7*  MCV 87.2 86.1 86.2  MCH  27.8 28.1 27.4  MCHC 31.9 32.7 31.8  RDW 15.7* 15.6* 15.5  PLT 203 220 254     BNP Recent Labs  Lab 03/18/21 0038  BNP 815.8*      DDimer No results for input(s): DDIMER in the last 168 hours.   CHA2DS2-VASc Score = 5  This indicates a 7.2% annual risk of stroke. The patient's score is based upon: CHF History: No HTN History: Yes Diabetes History: No Stroke History: No Vascular Disease History: Yes Age Score: 2 Gender Score: 1     Radiology    DG CHEST PORT 1 VIEW  Result Date: 03/19/2021 CLINICAL DATA:  Atrial fibrillation. EXAM: PORTABLE CHEST 1 VIEW COMPARISON:  03/17/2021 FINDINGS: Cardiomegaly, pulmonary vascular congestion and mild interstitial opacities are again identified. Mild bibasilar opacities likely representing atelectasis noted. No pneumothorax or large pleural effusion identified. No other significant change. IMPRESSION: Cardiomegaly with pulmonary vascular congestion and probable mild interstitial edema. Probable mild bibasilar atelectasis. Electronically Signed   By: Margarette Canada M.D.   On: 03/19/2021 08:45   ECHOCARDIOGRAM COMPLETE  Result Date:  03/19/2021    ECHOCARDIOGRAM REPORT   Patient Name:   Heather Saunders Date of Exam: 03/19/2021 Medical Rec #:  [id number]        Height:       64.0 in Accession #:    [id number]       Weight:       116.8 lb Date of Birth:  [birth year]        BSA:          1.557 m Patient Age:    51 years         BP:           150/75 mmHg Patient Gender: F                HR:           107 bpm. Exam Location:  Inpatient Procedure: 2D Echo, Cardiac Doppler and Color Doppler Indications:    Atrial fibrillation                 Elevated troponin  History:        Patient has prior history of Echocardiogram examinations, most                 recent 10/29/2020. Previous Myocardial Infarction and CAD; Risk                 Factors:Dyslipidemia. CKD.  Sonographer:    Clayton Lefort RDCS (AE) Referring Phys: [id number] [place]  1. Left ventricular ejection fraction, by estimation, is 60 to 65%. The left ventricle has normal function. The left ventricle has no regional wall motion abnormalities. There is severe left ventricular hypertrophy. Left ventricular diastolic function could not be evaluated.  2. Right ventricular systolic function is normal. The right ventricular size is normal. There is severely elevated pulmonary artery systolic pressure. The estimated right ventricular systolic pressure is [id number] mmHg.  3. Left atrial size was moderately dilated.  4. Mobile filamentous structure, which appears attached to the interatrial septum - consider thrombus or possible myxoma (image 67/68) - measuring ~0.6 x 2.0 cm. Right atrial size was mildly dilated.  5. The mitral valve is abnormal. Mild mitral valve regurgitation.  6. The aortic valve is tricuspid. Aortic valve regurgitation is mild. Mild aortic valve sclerosis is present, with no evidence of aortic valve stenosis.  7. The inferior vena cava is dilated in size  with >50% respiratory variability, suggesting right atrial pressure of 8 mmHg. Comparison(s): Changes from prior study are  noted. 10/29/2020: LVEF 60-65%, mobile RA mass can be seen but not noted in this study. FINDINGS  Left Ventricle: Left ventricular ejection fraction, by estimation, is 60 to 65%. The left ventricle has normal function. The left ventricle has no regional wall motion abnormalities. The left ventricular internal cavity size was normal in size. There is  severe left ventricular hypertrophy. Left ventricular diastolic function could not be evaluated due to atrial fibrillation. Left ventricular diastolic function could not be evaluated. Right Ventricle: The right ventricular size is normal. No increase in right ventricular wall thickness. Right ventricular systolic function is normal. There is severely elevated pulmonary artery systolic pressure. The tricuspid regurgitant velocity is 3.63 m/s, and with an assumed right atrial pressure of 8 mmHg, the estimated right ventricular systolic pressure is [id number] mmHg. Left Atrium: Left atrial size was moderately dilated. Right Atrium: Mobile filamentous structure, which appears attached to the interatrial septum - consider thrombus or possible myxoma - measuring ~0.6 x 2.0 cm. Right atrial size was mildly dilated. Pericardium: There is no evidence of pericardial effusion. Mitral Valve: The mitral valve is abnormal. There is mild thickening of the mitral valve leaflet(s). There is mild calcification of the mitral valve leaflet(s). Mild mitral valve regurgitation. Tricuspid Valve: The tricuspid valve is grossly normal. Tricuspid valve regurgitation is trivial. Aortic Valve: The aortic valve is tricuspid. Aortic valve regurgitation is mild. Aortic regurgitation PHT measures 676 msec. Mild aortic valve sclerosis is present, with no evidence of aortic valve stenosis. Aortic valve mean gradient measures 3.0 mmHg. Aortic valve peak gradient measures 4.9 mmHg. Aortic valve area, by VTI measures 4.19 cm. Pulmonic Valve: The pulmonic valve was normal in structure. Pulmonic valve  regurgitation is not visualized. Aorta: The aortic root and ascending aorta are structurally normal, with no evidence of dilitation. Venous: The inferior vena cava is dilated in size with greater than 50% respiratory variability, suggesting right atrial pressure of 8 mmHg. IAS/Shunts: No atrial level shunt detected by color flow Doppler.  LEFT VENTRICLE PLAX 2D LVIDd:         2.90 cm LVIDs:         2.40 cm LV PW:         1.60 cm LV IVS:        1.80 cm LVOT diam:     2.10 cm LV SV:         74 LV SV Index:   47 LVOT Area:     3.46 cm  RIGHT VENTRICLE             IVC RV S prime:     10.20 cm/s  IVC diam: 2.50 cm TAPSE (M-mode): 1.5 cm LEFT ATRIUM             Index       RIGHT ATRIUM           Index LA diam:        3.40 cm 2.18 cm/m  RA Area:     10.70 cm LA Vol (A2C):   43.5 ml 27.94 ml/m RA Volume:   20.60 ml  13.23 ml/m LA Vol (A4C):   76.7 ml 49.27 ml/m LA Biplane Vol: 62.9 ml 40.40 ml/m  AORTIC VALVE AV Area (Vmax):    3.63 cm AV Area (Vmean):   3.40 cm AV Area (VTI):     4.19 cm AV Vmax:  110.80 cm/s AV Vmean:          81.600 cm/s AV VTI:            0.176 m AV Peak Grad:      4.9 mmHg AV Mean Grad:      3.0 mmHg LVOT Vmax:         116.20 cm/s LVOT Vmean:        80.120 cm/s LVOT VTI:          0.213 m LVOT/AV VTI ratio: 1.21 AI PHT:            676 msec  AORTA Ao Root diam: 3.50 cm Ao Asc diam:  3.40 cm TRICUSPID VALVE TR Peak grad:   52.7 mmHg TR Vmax:        363.00 cm/s  SHUNTS Systemic VTI:  0.21 m Systemic Diam: 2.10 cm Lyman Bishop MD Electronically signed by Lyman Bishop MD Signature Date/Time: 03/19/2021/6:09:56 PM    Final    ECHO TEE  Result Date: 03/20/2021    TRANSESOPHOGEAL ECHO REPORT   Patient Name:   KAMARIE ROCKER Date of Exam: 03/20/2021 Medical Rec #:  [id number]        Height:       64.0 in Accession #:    [id number]       Weight:       117.9 lb Date of Birth:  [birth year]        BSA:          1.563 m Patient Age:    89 years         BP:           132/57 mmHg Patient Gender:  F                HR:           93 bpm. Exam Location:  Inpatient Procedure: Transesophageal Echo, Cardiac Doppler and Color Doppler Indications:     Atrial fibrillation  History:         Patient has prior history of Echocardiogram examinations, most                  recent 03/19/2021. CAD, Arrythmias:Atrial Fibrillation,                  Signs/Symptoms:CKD; Risk Factors:Hypertension.  Sonographer:     Dustin Flock Referring Phys:  [id number] Leanor Kail Diagnosing Phys: Oswaldo Milian MD PROCEDURE: The transesophogeal probe was passed without difficulty through the esophogus of the patient. Sedation performed by different physician. The patient was monitored while under deep sedation. Anesthestetic sedation was provided intravenously by Anesthesiology: 152.'48mg'$  of Propofol. The patient developed no complications during the procedure. IMPRESSIONS  1. Mobile filamentous mass measuring 1.7cm x 0.6cm in the right atrium, appears attached to eustachian valve and extends to the interatrial septum. Suspect likely thrombus but would also recommend checking blood cultures to evaluate for endocarditis. Cardioversion deferred given suspected RA thrombus  2. Left ventricular ejection fraction, by estimation, is 60 to 65%. The left ventricle has normal function. There is severe left ventricular hypertrophy.  3. Right ventricular systolic function is normal. The right ventricular size is normal.  4. Left atrial size was mildly dilated. No left atrial/left atrial appendage thrombus was detected.  5. The mitral valve is normal in structure. Mild mitral valve regurgitation.  6. The aortic valve is tricuspid. Aortic valve regurgitation is mild. Mild aortic valve sclerosis is present, with no evidence of aortic valve stenosis.  7. Pulmonic valve regurgitation is mild to moderate. FINDINGS  Left Ventricle: Left ventricular ejection fraction, by estimation, is 60 to 65%. The left ventricle has normal function. The left  ventricular internal cavity size was small. There is severe left ventricular hypertrophy. Right Ventricle: The right ventricular size is normal. No increase in right ventricular wall thickness. Right ventricular systolic function is normal. Left Atrium: Left atrial size was mildly dilated. No left atrial/left atrial appendage thrombus was detected. Right Atrium: Right atrial size was normal in size. Pericardium: There is no evidence of pericardial effusion. Mitral Valve: The mitral valve is normal in structure. Mild mitral valve regurgitation. Tricuspid Valve: The tricuspid valve is normal in structure. Tricuspid valve regurgitation is mild. Aortic Valve: The aortic valve is tricuspid. Aortic valve regurgitation is mild. Mild aortic valve sclerosis is present, with no evidence of aortic valve stenosis. Aortic valve peak gradient measures 10.0 mmHg. Pulmonic Valve: The pulmonic valve was grossly normal. Pulmonic valve regurgitation is moderate. Aorta: The aortic root and ascending aorta are structurally normal, with no evidence of dilitation. IAS/Shunts: No atrial level shunt detected by color flow Doppler.  AORTIC VALVE AV Vmax:      158.00 cm/s AV Peak Grad: 10.0 mmHg Oswaldo Milian MD Electronically signed by Oswaldo Milian MD Signature Date/Time: 03/20/2021/10:59:34 AM    Final     Cardiac Studies   2D echo 09/2020 IMPRESSIONS     1. Left ventricular ejection fraction, by estimation, is 60 to 65%. The  left ventricle has normal function. The left ventricle has no regional  wall motion abnormalities. There is severe left ventricular hypertrophy.  Left ventricular diastolic parameters   are consistent with Grade II diastolic dysfunction (pseudonormalization).  Elevated left ventricular end-diastolic pressure.   2. Right ventricular systolic function is normal. The right ventricular  size is normal.   3. The mitral valve is abnormal. Mild mitral valve regurgitation. No  evidence of mitral  stenosis.   4. The aortic valve is tricuspid. There is moderate calcification of the  aortic valve. There is moderate thickening of the aortic valve. Aortic  valve regurgitation is mild. Mild to moderate aortic valve  sclerosis/calcification is present, without any  evidence of aortic stenosis.   5. The inferior vena cava is normal in size with greater than 50%  respiratory variability, suggesting right atrial pressure of 3 mmHg.  Patient Profile     87 y.o. female with CKD, CAD, HLD, HTN, and osteoporosis who presents with generalized fatigue, throat pain and tachycardia who was found to have new onset AF/RVR.  Assessment & Plan    New onset atrial fibrillation with RVR -presented with worsening malaise for several weeks as well as sudden onset of throat discomfort in setting of AKI with SCr 5 (baseline 1.2)>>suspect she has been in afib for a while -CHADS2VASC score is 5 -2D echo showed normal LVF with EF 60-65% and severe LVH, severe PHTN with PASP 9mHg, moderate LAE, mild MR and AR and possible thrombus or myxoma in the RA -TEE showed Mobile filamentous mass measuring 1.7cm x 0.6cm in the right atrium,  appears attached to eustachian valve and extends to the interatrial  septum. Suspect likely thrombus but in the setting of elevated WBC would recommend checking blood cultures to rule out endocarditis>>this was discussed with TRH -Cardioversion deferred given suspected RA thrombus  -will increase Lopressor to '75mg'$  BID for better HR control>>BP is high so room to titrate BB -continue IF Heparin and [facility]  Elevated Troponin -minimally elevated at 53 and 51 -likely demand ischemia in the setting of afib with RVR -LVF normal on echo in Jan 2022 and echo this admit -no further ischemic workup -she denies any CP or SOB -she did have some throat pain but sounded more like a sore throat  Pulmonary HTN -she has moderate to severe pulmonary HTN on echo which has  increased from 48 to 14mHg compared to echo in Jan 2022. -given RA thrombus, will get VQ scan to rule out PE as etiology of worsening PHTN -likely related to pulmonary venous HTN from acute diastolic CHF in setting of afib with RVR  Acute diastolic CHF -Cxray yesterday with pulmonary vascular congestion and interstitial edema -likely triggered by afib with RVR -she is 3.6L positive -diuretics on hold due to AKI   ASCAD -nonobstructive by cath in the distant past -no anginal symptoms -had throat pain but sounds more like a sore throat and not actual angina -continue BB and statin -stop ASA since we are adding DOAC  AKI -seen by nephrology and feels likely due to prerenal azotemia and uptitration of ACE I -baseline SCr 2 and improved from 5 to 2.91 today  -renal following  I have spent a total of 40 minutes with patient reviewing 2D echo from January and this admission, TEE, hospital notes including nephrology consult , telemetry, EKGs, labs and examining patient as well as establishing an assessment and plan that was discussed with the patient.  > 50% of time was spent in direct patient care.       For questions or updates, please contact [place] consult [email or web address] for contact info under        Signed, TFransico Him MD  03/20/2021, 9:09 PM

## 2021-03-20 NOTE — Plan of Care (Signed)

## 2021-03-20 NOTE — Progress Notes (Signed)
PROGRESS NOTE    Malea Kendra  [id number] DOB: September 15, 1925 DOA: 03/17/2021 PCP: Kathyrn Lass, MD    Brief Narrative:  Mrs. Packer was admitted to the hospital with a working diagnosis of new onset atrial fibrillation with rapid ventricular response in the setting of AKI.   87 year old female past medical history for coronary artery disease, hypertension and dyslipidemia who presented with sudden onset of throat discomfort/chest pain.  Apparently not feeling well for about 4 weeks, gradually worsening.  When EMS arrived she was in atrial fibrillation with rapid ventricular response that prompted her to be brought to the hospital.  She was placed on diltiazem drip, blood pressure 128/85, heart rate 102, respiratory 12, oxygen saturation 96%.  Her lungs were clear to auscultation, heart S1-S2, present, irregularly irregular, abdomen soft, no lower extremity edema.   Sodium 135, potassium 4.3, chloride 107, bicarb 14, glucose 118, BUN 80, creatinine 5.0, anion gap 14, white count 13.4, hemoglobin 10.8, hematocrit 33.9, platelets 194. SARS COVID-19 negative.  Urinalysis specific gravity 1.018, 100 protein, 0-5 white cells.   Chest radiograph with increased insertional markings bilaterally.   EKG 133 bpm, normal axis, normal QTC, atrial fibrillation rhythm, no significant ST segment or T wave changes.   Patient was placed on diltiazem and heparin infusion for good toleration.   Patient underwent TEE founding a possible right atrial clot.    Assessment & Plan:   Principal Problem:   Atrial fibrillation with RVR (HCC) Active Problems:   Essential hypertension   Mixed hyperlipidemia   AKI (acute kidney injury) ([place])     Atrial fibrillation with rapid ventricular response, (new onset).  No chest pain or palpitations, her heart rate has been in the 90's, continue atrial fibrillation rhythm, personally reviewed telemetry.   Case discussed with Dr Radford Pax form cardiology, positive  right atrium clot but can not rule out vegetation.    Plan to continue rate control with metoprolol 50 mg po bid and anticoagulation with heparin.  Check blood cultures.  Continue telemetry monitoring, out of bed to chair tid with meals.    2. AKI on CKD IV with anion gap metabolic acidosis. Today patient with sings of hypervolemia with pitting lower extremity edema and bilateral bibasilar rales on auscultation.  Renal function with serum cr at 2.91 with K at 4,0 and serum bicarbonate at 19. Anion gap is 11.   Plan to discontinue IV fluids and continue close monitoring of urine out put, strict in and out. (This am no documentation on urine output).  If persistent or worsening edema, will need diuresis.   3. HTN/ dyslipidemia. Tolerating well metoprolol for atrial fibrillation rate control.   Blood pressure 142/80 mmHg.   Continue with atorvastatin and ezetimibe.    4. Anemia of chronic disease. Positive leukocytosis  Wbc continue to be elevated ar 15,8, Hgb stable at 10,7 Follow on blood cultures and continue to hold on antibiotic therapy for now.   Patient continue to be at high risk for worsening atrial fibrillation   Status is: Inpatient  Remains inpatient appropriate because:IV treatments appropriate due to intensity of illness or inability to take PO  Dispo:  Patient From: Home  Planned Disposition: [place]  Medically stable for discharge: No     DVT prophylaxis: Heparin   Code Status:   full  Family Communication:  No family at the bedside     Consultants:  Cardiology  Nephrology   Procedures:  TEE    Subjective: Patient continue with no  chest pain or dyspnea, weakness not worsening, no nausea or vomiting.   Objective: Vitals:   03/20/21 0812 03/20/21 0822 03/20/21 0832 03/20/21 0930  BP: (!) 112/44 (!) 132/57 (!) 169/70 (!) 142/80  Pulse: 94 93 89 99  Resp: (!) 22 (!) 24 (!) 26   Temp: 97.7 F (36.5 C)     TempSrc: Axillary     SpO2:  91% 93% 91%   Weight:      Height:        Intake/Output Summary (Last 24 hours) at 03/20/2021 0936 Last data filed at 03/20/2021 [id number] Gross per 24 hour  Intake 2152.35 ml  Output --  Net 2152.35 ml   Filed Weights   03/18/21 1635 03/19/21 0326 03/20/21 0356  Weight: 53 kg 53 kg 53.5 kg    Examination:   General: Not in pain or dyspnea, deconditioned  Neurology: Awake and alert, non focal  E ENT: no pallor, no icterus, oral mucosa moist Cardiovascular: No JVD. S1-S2 present, rhythmic, no gallops, rubs, or murmurs. + pitting bilateral lower extremity edema. Pulmonary: positive breath sounds bilaterally, adequate air movement, no wheezing, rhonchi, positive bilateral bibasilar rales. Gastrointestinal. Abdomen soft and non tender Skin. No rashes Musculoskeletal: no joint deformities     Data Reviewed: I have personally reviewed following labs and imaging studies  CBC: Recent Labs  Lab 03/17/21 2315 03/18/21 0500 03/19/21 0309 03/20/21 0007  WBC 13.4* 12.3* 13.7* 15.8*  HGB 10.8* 10.4* 11.3* 10.7*  HCT 33.9* 32.6* 34.6* 33.7*  MCV 87.8 87.2 86.1 86.2  PLT 194 203 220 0000000   Basic Metabolic Panel: Recent Labs  Lab 03/17/21 2315 03/18/21 0037 03/18/21 0500 03/19/21 0309 03/20/21 0007  NA 135 135 136 137 140  K 4.3 4.6 4.7 4.3 4.0  CL 107 107 106 107 110  CO2 14* 14* 17* 17* 19*  GLUCOSE 118* 122* 106* 120* 111*  BUN 80* 77* 79* 77* 67*  CREATININE 5.04* 5.05* 4.76* 3.86* 2.91*  CALCIUM 8.6* 8.8* 8.6* 8.5* 8.6*  MG 2.0  --  2.2  --   --   PHOS  --   --  3.6 2.9 2.6   GFR: Estimated Creatinine Clearance: 10 mL/min (A) (by C-G formula based on SCr of 2.91 mg/dL (H)). Liver Function Tests: Recent Labs  Lab 03/18/21 0500 03/18/21 0808 03/19/21 0309 03/20/21 0007  AST  --  28  --   --   ALT  --  37  --   --   ALKPHOS  --  77  --   --   BILITOT  --  1.3*  --   --   PROT  --  5.3*  --   --   ALBUMIN 3.0* 2.9* 2.8* 2.9*   No results for input(s): LIPASE,  AMYLASE in the last 168 hours. No results for input(s): AMMONIA in the last 168 hours. Coagulation Profile: Recent Labs  Lab 03/19/21 1813  INR 1.1   Cardiac Enzymes: No results for input(s): CKTOTAL, CKMB, CKMBINDEX, TROPONINI in the last 168 hours. BNP (last 3 results) No results for input(s): PROBNP in the last 8760 hours. HbA1C: No results for input(s): HGBA1C in the last 72 hours. CBG: No results for input(s): GLUCAP in the last 168 hours. Lipid Profile: No results for input(s): CHOL, HDL, LDLCALC, TRIG, CHOLHDL, LDLDIRECT in the last 72 hours. Thyroid Function Tests: Recent Labs    03/18/21 0500  TSH 4.302   Anemia Panel: Recent Labs    03/18/21 0500  VITAMINB12  770  FOLATE 26.1  FERRITIN 211  TIBC 214*  IRON 27*      Radiology Studies: I have reviewed all of the imaging during this hospital visit personally     Scheduled Meds:  atorvastatin  40 mg Oral Daily   ezetimibe  10 mg Oral Daily   metoprolol tartrate  50 mg Oral BID   multivitamin with minerals  1 tablet Oral Daily   Continuous Infusions:  sodium chloride 75 mL/hr at 03/19/21 0647   heparin 800 Units/hr (03/20/21 0735)     LOS: 2 days        Tramain Gershman Gerome Apley, MD

## 2021-03-20 NOTE — Progress Notes (Signed)
Physical Therapy Treatment Patient Details Name: Heather Saunders MRN: [id number] DOB: 1925/08/23 Today's Date: 03/20/2021    History of Present Illness Pt is a 87 y/o female presenting on 6/18 to ED for sudden onset of throat discomfrot and concern for possible anginal equivalent. Pt found to have AKI on CKD 3B. Pt with new onset a fib with RVR. CXR shows pulmonary edema. On 6/21, pt had TEE with plan for Cardioversion but cardioversion deferred in setting of suspected RA thrombus. PMH: coronary artery disease, essential hypertension, hyperlipidemia.    PT Comments    Pt making good progress today.  She was able to ambulate 76' with max HR of 125 bpm.  Pt with mild instability and required assist with RW.  She did fatigue easily.  Continue to recommend SNF at d/c to maximize function and for safety as pt lives alone.     Follow Up Recommendations  SNF;Supervision for mobility/OOB     Equipment Recommendations  Rolling walker with 5" wheels;3in1 (PT)    Recommendations for Other Services       Precautions / Restrictions Precautions Precautions: Fall;Other (comment) Precaution Comments: watch HR    Mobility  Bed Mobility Overal bed mobility: Needs Assistance Bed Mobility: Supine to Sit     Supine to sit: Supervision;HOB elevated          Transfers Overall transfer level: Needs assistance Equipment used: Rolling walker (2 wheeled) Transfers: Sit to/from [facility] Sit to Stand: Min guard Stand pivot transfers: Min guard       General transfer comment: Performed sit to stand x 2; cues for safe hand placement  Ambulation/Gait Ambulation/Gait assistance: Min assist Gait Distance (Feet): 65 Feet Assistive device: Rolling walker (2 wheeled) Gait Pattern/deviations: Step-through pattern     General Gait Details: Pt with mild unsteadiness requiring min guard; did require cues and min A for RW at times   Stairs             Wheelchair  Mobility    Modified Rankin (Stroke Patients Only)       Balance Overall balance assessment: Needs assistance   Sitting balance-Leahy Scale: Good     Standing balance support: During functional activity;Bilateral upper extremity supported;No upper extremity supported Standing balance-Leahy Scale: Fair Standing balance comment: static stand without AD; RW to ambulate                            Cognition Arousal/Alertness: Awake/alert Behavior During Therapy: WFL for tasks assessed/performed Overall Cognitive Status: Within Functional Limits for tasks assessed                                        Exercises      General Comments General comments (skin integrity, edema, etc.): Pts with Afib, HR 101-125 bpm during treatment.  O2 sats were 100% on RA.  Pt reports still planning on SNF due to living alone.      Pertinent Vitals/Pain Pain Assessment: No/denies pain    Home Living                      Prior Function            PT Goals (current goals can now be found in the care plan section) Acute Rehab PT Goals Patient Stated Goal: Return to baseline and go home PT  Goal Formulation: With patient Time For Goal Achievement: 04/01/21 Potential to Achieve Goals: Good Progress towards PT goals: Progressing toward goals    Frequency    Min 2X/week      PT Plan Current plan remains appropriate    Co-evaluation              AM-PAC PT "6 Clicks" Mobility   Outcome Measure  Help needed turning from your back to your side while in a flat bed without using bedrails?: A Little Help needed moving from lying on your back to sitting on the side of a flat bed without using bedrails?: A Little Help needed moving to and from a bed to a chair (including a wheelchair)?: A Little Help needed standing up from a chair using your arms (e.g., wheelchair or bedside chair)?: A Little Help needed to walk in hospital room?: A Little Help  needed climbing 3-5 steps with a railing? : A Little 6 Click Score: 18    End of Session Equipment Utilized During Treatment: Gait belt Activity Tolerance: Patient tolerated treatment well Patient left: with call bell/phone within reach;in chair Nurse Communication: Mobility status PT Visit Diagnosis: Unsteadiness on feet (R26.81);Other abnormalities of gait and mobility (R26.89);Muscle weakness (generalized) (M62.81);Difficulty in walking, not elsewhere classified (R26.2)     Time: [id number] PT Time Calculation (min) (ACUTE ONLY): 20 min  Charges:  $Gait Training: 8-22 mins                     Abran Richard, PT Acute Rehab Services Pager [phone] [facility] [place] 03/20/2021, 1:36 PM

## 2021-03-20 NOTE — Anesthesia Procedure Notes (Signed)
Procedure Name: MAC Date/Time: 03/20/2021 7:40 AM Performed by: Kathryne Hitch, CRNA Pre-anesthesia Checklist: Patient identified, Emergency Drugs available, Suction available and Patient being monitored Patient Re-evaluated:Patient Re-evaluated prior to induction Oxygen Delivery Method: Nasal cannula Preoxygenation: Pre-oxygenation with 100% oxygen Induction Type: IV induction Placement Confirmation: positive ETCO2 Dental Injury: Teeth and Oropharynx as per pre-operative assessment

## 2021-03-20 NOTE — Interval H&P Note (Signed)
History and Physical Interval Note:  03/20/2021 7:32 AM  Heather Saunders  has presented today for surgery, with the diagnosis of AFIB.  The various methods of treatment have been discussed with the patient and family. After consideration of risks, benefits and other options for treatment, the patient has consented to  Procedure(s): TRANSESOPHAGEAL ECHOCARDIOGRAM (TEE) (N/A) CARDIOVERSION (N/A) as a surgical intervention.  The patient's history has been reviewed, patient examined, no change in status, stable for surgery.  I have reviewed the patient's chart and labs.  Questions were answered to the patient's satisfaction.     Donato Heinz

## 2021-03-20 NOTE — Transfer of Care (Signed)
Immediate Anesthesia Transfer of Care Note  Patient: Heather Saunders  Procedure(s) Performed: TRANSESOPHAGEAL ECHOCARDIOGRAM (TEE)  Patient Location: Endoscopy Unit  Anesthesia Type:MAC  Level of Consciousness: drowsy and patient cooperative  Airway & Oxygen Therapy: Patient Spontanous Breathing and Patient connected to nasal cannula oxygen  Post-op Assessment: Report given to RN and Post -op Vital signs reviewed and stable  Post vital signs: Reviewed and stable  Last Vitals:  Vitals Value Taken Time  BP 112/44 03/20/21 0812  Temp    Pulse 80 03/20/21 0814  Resp 23 03/20/21 0814  SpO2 94 % 03/20/21 0814  Vitals shown include unvalidated device data.  Last Pain:  Vitals:   03/20/21 0720  TempSrc: Oral  PainSc: 0-No pain         Complications: No notable events documented.

## 2021-03-20 NOTE — Plan of Care (Signed)
  Problem: Education: Goal: Knowledge of General Education information will improve Description: Including pain rating scale, medication(s)/side effects and non-pharmacologic comfort measures Outcome: Progressing   Problem: Clinical Measurements: Goal: Diagnostic test results will improve Outcome: Progressing Goal: Respiratory complications will improve Outcome: Progressing   Problem: Nutrition: Goal: Adequate nutrition will be maintained Outcome: Progressing   Problem: Coping: Goal: Level of anxiety will decrease Outcome: Progressing   Problem: Elimination: Goal: Will not experience complications related to bowel motility Outcome: Progressing Goal: Will not experience complications related to urinary retention Outcome: Progressing   Problem: Pain Managment: Goal: General experience of comfort will improve Outcome: Progressing   Problem: Safety: Goal: Ability to remain free from injury will improve Outcome: Progressing

## 2021-03-21 ENCOUNTER — Inpatient Hospital Stay (HOSPITAL_COMMUNITY): Payer: Medicare Other

## 2021-03-21 ENCOUNTER — Other Ambulatory Visit (HOSPITAL_COMMUNITY): Payer: Self-pay

## 2021-03-21 DIAGNOSIS — I5031 Acute diastolic (congestive) heart failure: Secondary | ICD-10-CM | POA: Diagnosis not present

## 2021-03-21 DIAGNOSIS — E782 Mixed hyperlipidemia: Secondary | ICD-10-CM

## 2021-03-21 DIAGNOSIS — R609 Edema, unspecified: Secondary | ICD-10-CM

## 2021-03-21 DIAGNOSIS — I10 Essential (primary) hypertension: Secondary | ICD-10-CM | POA: Diagnosis not present

## 2021-03-21 DIAGNOSIS — N179 Acute kidney failure, unspecified: Secondary | ICD-10-CM | POA: Diagnosis not present

## 2021-03-21 DIAGNOSIS — I4891 Unspecified atrial fibrillation: Secondary | ICD-10-CM | POA: Diagnosis not present

## 2021-03-21 LAB — BASIC METABOLIC PANEL
Anion gap: 8 (ref 5–15)
BUN: 63 mg/dL — ABNORMAL HIGH (ref 8–23)
CO2: 19 mmol/L — ABNORMAL LOW (ref 22–32)
Calcium: 8.5 mg/dL — ABNORMAL LOW (ref 8.9–10.3)
Chloride: 110 mmol/L (ref 98–111)
Creatinine, Ser: 2.67 mg/dL — ABNORMAL HIGH (ref 0.44–1.00)
GFR, Estimated: 16 mL/min — ABNORMAL LOW (ref >60)
Glucose, Bld: 143 mg/dL — ABNORMAL HIGH (ref 70–99)
Potassium: 4.4 mmol/L (ref 3.5–5.1)
Sodium: 137 mmol/L (ref 135–145)

## 2021-03-21 LAB — BLOOD CULTURE ID PANEL (REFLEXED) - BCID2

## 2021-03-21 LAB — HEPARIN LEVEL (UNFRACTIONATED): Heparin Unfractionated: 0.38 IU/mL (ref 0.30–0.70)

## 2021-03-21 MED ORDER — TECHNETIUM TO 99M ALBUMIN AGGREGATED
4.2000 | Freq: Once | INTRAVENOUS | Status: AC | PRN
  Administered 2021-03-21: 4.2 via INTRAVENOUS

## 2021-03-21 MED ORDER — APIXABAN 2.5 MG PO TABS
2.5000 mg | ORAL_TABLET | Freq: Two times a day (BID) | ORAL | Status: DC
  Administered 2021-03-21 – 2021-03-30 (×19): 2.5 mg via ORAL
  Filled 2021-03-21 (×19): qty 1

## 2021-03-21 MED ORDER — METOPROLOL TARTRATE 50 MG PO TABS
100.0000 mg | ORAL_TABLET | Freq: Two times a day (BID) | ORAL | Status: DC
  Administered 2021-03-21 – 2021-03-30 (×18): 100 mg via ORAL
  Filled 2021-03-21 (×18): qty 2

## 2021-03-21 NOTE — Progress Notes (Addendum)
PROGRESS NOTE    Heather Saunders  [id number] DOB: 22-Nov-1924 DOA: 03/17/2021 PCP: Kathyrn Lass, MD    Brief Narrative:  Heather Saunders was admitted to the hospital with a working diagnosis of new onset atrial fibrillation with rapid ventricular response in the setting of AKI.   87 year old female past medical history for coronary artery disease, hypertension and dyslipidemia who presented with sudden onset of throat discomfort/chest pain.  Apparently not feeling well for about 4 weeks, gradually worsening.  When EMS arrived she was in atrial fibrillation with rapid ventricular response that prompted her to be brought to the hospital.  She was placed on diltiazem drip, blood pressure 128/85, heart rate 102, respiratory 12, oxygen saturation 96%.  Her lungs were clear to auscultation, heart S1-S2, present, irregularly irregular, abdomen soft, no lower extremity edema.   Sodium 135, potassium 4.3, chloride 107, bicarb 14, glucose 118, BUN 80, creatinine 5.0, anion gap 14, white count 13.4, hemoglobin 10.8, hematocrit 33.9, platelets 194. SARS COVID-19 negative.  Urinalysis specific gravity 1.018, 100 protein, 0-5 white cells.   Chest radiograph with increased insertional markings bilaterally.   EKG 133 bpm, normal axis, normal QTC, atrial fibrillation rhythm, no significant ST segment or T wave changes.   Patient was placed on diltiazem and heparin infusion for good toleration.   Patient underwent TEE founding a possible right atrial clot.   Heart rate partially controlled on metoprolol, continue to be very weak and deconditioned.  Renal function is slowly improving.    Assessment & Plan:   Principal Problem:   Atrial fibrillation with RVR (HCC) Active Problems:   Essential hypertension   Mixed hyperlipidemia   AKI (acute kidney injury) ([place])   Acute kidney injury ([place])   Pulmonary HTN (HCC)   Right atrial mass   Acute diastolic CHF (congestive heart failure) (HCC)     Atrial  fibrillation with rapid ventricular response, (new onset).   Acute diastolic heart failure.  Telemetry personally reviewed, persistent atrial fibrillation with HR in th high 80's overnight, this am up to low 100's. No chest pain or palpitations and denies dyspnea.  Clinically with no worsening hypervolemia, will continue to hold on diuretic therapy for now.  Blood cultures with one bottle positive for Staph epidermidis likely contamination, 2nd bottle with no growth.    Increased metoprolol to 75 mg po bid  Continue anticoagulation on heparin drip, until further stabilization of renal function.  Pending lung perfusion and ventilation scan for further work up.  Holding on electrical cardioversion due to right atrium possible clot.     2. AKI on CKD IV with anion gap metabolic acidosis. No documentation of urine output but renal function is improving with serum cr down to 2,67 with K at 4,4 and serum bicarbonate at 19.  Continue to have + pitting edema at the ankles.   Continue to hold on IV fluids for now and follow up on renal function in am, am. Avoid hypotension and nephrotoxic medications.   Continue with oral sodium bicarbonate.    3. HTN/ dyslipidemia. Blood pressure 130 to 167 mmHg. Will continue metoprolol for rate control atrial fibrillation.  At home on amlodipine and hydralazine.   On atorvastatin and ezetimibe.    4. Anemia of chronic disease. Positive leukocytosis  Follow cell count in am, no signs of systemic infection. Blood culture 1 bottle contaminated with staph epidermidis.      Patient continue to be at high risk for worsening atrial fibrillation   Status is: Inpatient  Remains inpatient appropriate because:IV treatments appropriate due to intensity of illness or inability to take PO  Dispo:  Patient From: Home  Planned Disposition: [place]  Medically stable for discharge: No      DVT prophylaxis: Heparin   Code Status:   full  Family  Communication:  No family at the bedside       Consultants:  Cardiology    Subjective: Patient continue to be very weak and deconditioned, not yet back to baseline, no nausea or vomiting, no dyspnea or chest pain, no palpitations.   Objective: Vitals:   03/21/21 0300 03/21/21 0445 03/21/21 0700 03/21/21 0800  BP: 139/79  (!) 130/58 (!) 167/92  Pulse: 98  91 (!) 117  Resp: 20  (!) 29 (!) 23  Temp: 98.7 F (37.1 C)  98.7 F (37.1 C)   TempSrc: Oral  Oral   SpO2: 93%  93% 92%  Weight:  54.2 kg    Height:        Intake/Output Summary (Last 24 hours) at 03/21/2021 0831 Last data filed at 03/21/2021 0800 Gross per 24 hour  Intake 1386.35 ml  Output 0 ml  Net 1386.35 ml   Filed Weights   03/19/21 0326 03/20/21 0356 03/21/21 0445  Weight: 53 kg 53.5 kg 54.2 kg    Examination:   General: Not in pain or dyspnea, deconditioned  Neurology: Awake and alert, non focal  E ENT: no pallor, no icterus, oral mucosa moist Cardiovascular: No JVD. S1-S2 present, rhythmic, no gallops, rubs, or murmurs. + pitting bilateral  lower extremity edema at the ankles.  Pulmonary: positive  breath sounds bilaterally, with no wheezing, or rhonchi. Positive bilateral inspiratory rales. Gastrointestinal. Abdomen soft and non tender Skin. No rashes Musculoskeletal: no joint deformities     Data Reviewed: I have personally reviewed following labs and imaging studies  CBC: Recent Labs  Lab 03/17/21 2315 03/18/21 0500 03/19/21 0309 03/20/21 0007  WBC 13.4* 12.3* 13.7* 15.8*  HGB 10.8* 10.4* 11.3* 10.7*  HCT 33.9* 32.6* 34.6* 33.7*  MCV 87.8 87.2 86.1 86.2  PLT 194 203 220 0000000   Basic Metabolic Panel: Recent Labs  Lab 03/17/21 2315 03/18/21 0037 03/18/21 0500 03/19/21 0309 03/20/21 0007 03/21/21 0035  NA 135 135 136 137 140 137  K 4.3 4.6 4.7 4.3 4.0 4.4  CL 107 107 106 107 110 110  CO2 14* 14* 17* 17* 19* 19*  GLUCOSE 118* 122* 106* 120* 111* 143*  BUN 80* 77* 79* 77* 67* 63*   CREATININE 5.04* 5.05* 4.76* 3.86* 2.91* 2.67*  CALCIUM 8.6* 8.8* 8.6* 8.5* 8.6* 8.5*  MG 2.0  --  2.2  --   --   --   PHOS  --   --  3.6 2.9 2.6  --    GFR: Estimated Creatinine Clearance: 11 mL/min (A) (by C-G formula based on SCr of 2.67 mg/dL (H)). Liver Function Tests: Recent Labs  Lab 03/18/21 0500 03/18/21 0808 03/19/21 0309 03/20/21 0007  AST  --  28  --   --   ALT  --  37  --   --   ALKPHOS  --  77  --   --   BILITOT  --  1.3*  --   --   PROT  --  5.3*  --   --   ALBUMIN 3.0* 2.9* 2.8* 2.9*   No results for input(s): LIPASE, AMYLASE in the last 168 hours. No results for input(s): AMMONIA in the last 168  hours. Coagulation Profile: Recent Labs  Lab 03/19/21 1813  INR 1.1   Cardiac Enzymes: No results for input(s): CKTOTAL, CKMB, CKMBINDEX, TROPONINI in the last 168 hours. BNP (last 3 results) No results for input(s): PROBNP in the last 8760 hours. HbA1C: No results for input(s): HGBA1C in the last 72 hours. CBG: No results for input(s): GLUCAP in the last 168 hours. Lipid Profile: No results for input(s): CHOL, HDL, LDLCALC, TRIG, CHOLHDL, LDLDIRECT in the last 72 hours. Thyroid Function Tests: No results for input(s): TSH, T4TOTAL, FREET4, T3FREE, THYROIDAB in the last 72 hours. Anemia Panel: No results for input(s): VITAMINB12, FOLATE, FERRITIN, TIBC, IRON, RETICCTPCT in the last 72 hours.    Radiology Studies: I have reviewed all of the imaging during this hospital visit personally     Scheduled Meds:  atorvastatin  40 mg Oral Daily   ezetimibe  10 mg Oral Daily   metoprolol tartrate  75 mg Oral BID   multivitamin with minerals  1 tablet Oral Daily   sodium bicarbonate  650 mg Oral BID   Continuous Infusions:  heparin 800 Units/hr (03/21/21 0653)     LOS: 3 days        Artina Minella Gerome Apley, MD

## 2021-03-21 NOTE — TOC Benefit Eligibility Note (Signed)
Patient Advocate Encounter  Insurance verification completed.    The patient is currently admitted and upon discharge could be taking Eliquis 5 mg.  The current 30 day co-pay is, $47.00.   The patient is insured through [employer] Medicare Part D    Parissa Chiao, CPhT Pharmacy Patient Advocate Specialist [place] Antimicrobial Stewardship Team Direct Number: [phone]  Fax: [phone]        

## 2021-03-21 NOTE — Progress Notes (Signed)
Occupational Therapy Treatment Patient Details Name: Heather Saunders MRN: [id number] DOB: 09-13-1925 Today's Date: 03/21/2021    History of present illness Pt is a 87 y/o female presenting on 6/18 to ED for sudden onset of throat discomfrot and concern for possible anginal equivalent. Pt found to have AKI on CKD 3B. Pt with new onset a fib with RVR. CXR shows pulmonary edema. On 6/21, pt had TEE with plan for Cardioversion but cardioversion deferred in setting of suspected RA thrombus. PMH: coronary artery disease, essential hypertension, hyperlipidemia.   OT comments  Pt progressing towards acute OT goals. Remains to have decreased balance, generalized weakness, and decreased activity tolerance impacting safety and assist level with ADLs. D/c plan remains appropriate as pt is from home alone.    Follow Up Recommendations  SNF;Supervision - Intermittent (OOB/mobility)    Equipment Recommendations  3 in 1 bedside commode;Other (comment)    Recommendations for Other Services      Precautions / Restrictions Precautions Precautions: Fall;Other (comment) Precaution Comments: watch HR Restrictions Weight Bearing Restrictions: No       Mobility Bed Mobility Overal bed mobility: Needs Assistance Bed Mobility: Supine to Sit;Sit to Supine     Supine to sit: Supervision;HOB elevated Sit to supine: Supervision   General bed mobility comments: supervision for safety & line mgmt    Transfers Overall transfer level: Needs assistance Equipment used: Rolling walker (2 wheeled) Transfers: Sit to/from Stand Sit to Stand: Min guard         General transfer comment: to/from EOB. min guard for safety.    Balance Overall balance assessment: Needs assistance Sitting-balance support: Feet supported Sitting balance-Leahy Scale: Good     Standing balance support: During functional activity;Bilateral upper extremity supported;No upper extremity supported Standing balance-Leahy Scale:  Fair Standing balance comment: static stand without AD; RW to ambulate                           ADL either performed or assessed with clinical judgement   ADL Overall ADL's : Needs assistance/impaired                         Toilet Transfer: Min guard;Ambulation;RW Toilet Transfer Details (indicate cue type and reason): needs BUE support vs min A for walking           General ADL Comments: Pt completed bathroom distance functional mobility. Min A when trying to walk with no AD, min guard with rw. HR up to 147 with activity, 109 at rest.     Vision       Perception     Praxis      Cognition Arousal/Alertness: Awake/alert Behavior During Therapy: WFL for tasks assessed/performed Overall Cognitive Status: Within Functional Limits for tasks assessed                                          Exercises     Shoulder Instructions       General Comments HR 109 at rest, up to 147 walking in the room. Pt reports still planning on SNF due to living alone.    Pertinent Vitals/ Pain       Pain Assessment: No/denies pain  Home Living  Prior Functioning/Environment              Frequency  Min 2X/week        Progress Toward Goals  OT Goals(current goals can now be found in the care plan section)  Progress towards OT goals: Progressing toward goals  Acute Rehab OT Goals Patient Stated Goal: Return to baseline and go home OT Goal Formulation: With patient Time For Goal Achievement: 04/02/21 Potential to Achieve Goals: Good ADL Goals Pt Will Perform Grooming: with modified independence;standing Pt Will Perform Lower Body Bathing: with modified independence;sitting/lateral leans;sit to/from stand Pt Will Perform Lower Body Dressing: with modified independence;sit to/from stand;sitting/lateral leans Pt Will Transfer to Toilet: with modified independence;ambulating Pt  Will Perform Tub/Shower Transfer: with set-up;Tub transfer;ambulating;rolling walker Pt/caregiver will Perform Home Exercise Program: Increased strength;Both right and left upper extremity;With theraband;With written HEP provided;With Supervision  Plan Discharge plan remains appropriate    Co-evaluation                 AM-PAC OT "6 Clicks" Daily Activity     Outcome Measure   Help from another person eating meals?: None Help from another person taking care of personal grooming?: A Little Help from another person toileting, which includes using toliet, bedpan, or urinal?: A Little Help from another person bathing (including washing, rinsing, drying)?: A Little Help from another person to put on and taking off regular upper body clothing?: A Little Help from another person to put on and taking off regular lower body clothing?: A Little 6 Click Score: 19    End of Session Equipment Utilized During Treatment: Rolling walker  OT Visit Diagnosis: Unsteadiness on feet (R26.81);Other abnormalities of gait and mobility (R26.89);Muscle weakness (generalized) (M62.81)   Activity Tolerance Other (comment) (tachycardia)   Patient Left in bed;with bed alarm set;with call bell/phone within reach   Nurse Communication          Time: 0940-1000 OT Time Calculation (min): 20 min  Charges: OT General Charges $OT Visit: 1 Visit OT Treatments $Self Care/Home Management : 8-22 mins  Heather Saunders, OT Acute Rehabilitation Services Pager: [phone] Office: [phone]    Heather Saunders 03/21/2021, 1:23 PM

## 2021-03-21 NOTE — Progress Notes (Signed)
ANTICOAGULATION CONSULT NOTE  Pharmacy Consult for heparin Indication: atrial fibrillation  Allergies  Allergen Reactions   Boniva [Ibandronic Acid]     Sore throat   Fosamax [Alendronate Sodium]     Sore throat    Patient Measurements: Height: '5\' 4"'$  (162.6 cm) Weight: 54.2 kg (119 lb 7.8 oz) IBW/kg (Calculated) : 54.7 Heparin Dosing Weight: 53 kg  Vital Signs: Temp: 98.7 F (37.1 C) (06/22 0700) Temp Source: Oral (06/22 0700) BP: 167/92 (06/22 0800) Pulse Rate: 117 (06/22 0800)  Labs: Recent Labs    03/19/21 0309 03/19/21 1813 03/19/21 1938 03/20/21 0007 03/21/21 0035  HGB 11.3*  --   --  10.7*  --   HCT 34.6*  --   --  33.7*  --   PLT 220  --   --  254  --   LABPROT  --  14.0  --   --   --   INR  --  1.1  --   --   --   HEPARINUNFRC  --   --  0.48 0.56 0.38  CREATININE 3.86*  --   --  2.91* 2.67*     Estimated Creatinine Clearance: 11 mL/min (A) (by C-G formula based on SCr of 2.67 mg/dL (H)).   Medical History: Past Medical History:  Diagnosis Date   Chronic kidney disease    Coronary disease    NonObstructive   History of nuclear stress test    Nuclear stress test 10/18: EF 96, normal perfusion, low risk   Hyperlipidemia    Hypertension    Myocardial infarct [facility]) 2006   Osteopenia    Osteoporosis     Medications:  Medications Prior to Admission  Medication Sig Dispense Refill Last Dose   acetaminophen (TYLENOL) 500 MG tablet Take 500 mg by mouth every 6 (six) hours as needed for mild pain.   03/17/2021   amLODipine (NORVASC) 5 MG tablet TAKE 1 TABLET BY MOUTH TWICE A DAY (Patient taking differently: Take 5 mg by mouth in the morning and at bedtime.) 180 tablet 1 03/16/2021   aspirin 81 MG tablet Take 81 mg by mouth daily.   03/16/2021   atorvastatin (LIPITOR) 40 MG tablet Take 40 mg by mouth daily.   03/16/2021   Cholecalciferol 100 MCG (4000 UT) CAPS Take 4,000 Units by mouth daily.   03/16/2021   ezetimibe (ZETIA) 10 MG tablet TAKE 1 TABLET BY  MOUTH EVERY DAY (Patient taking differently: Take 10 mg by mouth daily.) 90 tablet 3 03/16/2021   hydrALAZINE (APRESOLINE) 25 MG tablet Take 25 mg by mouth in the morning, at noon, and at bedtime. With food   03/17/2021   metoprolol tartrate (LOPRESSOR) 50 MG tablet Take 50 mg by mouth 2 (two) times daily. With food   03/17/2021 at 1000   Multiple Vitamin (MULTIVITAMIN) capsule Take 1 capsule by mouth daily.   Past Month   polyethylene glycol (MIRALAX MIX-IN PAX) 17 g packet Take 17 g by mouth daily as needed for mild constipation.   Past Week    Assessment: 87 y.o. female with new onset AF/RVR. Pharmacy consulted to start heparin. Of note, patient CKD 3 at baseline with acute AKI. Not on anticoagulation PTA.  Heparin level came back therapeutic at 0.38, on 800 units/hr. Hgb 10.7, plt 254 - on last CBC 6/21. No s/sx of bleeding or infusion issues.   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   Goal of Therapy:  Heparin level 0.3-0.7 units/ml Monitor  platelets by anticoagulation protocol: Yes   Plan:  Continue heparin infusion at 800 units/hr Check anti-Xa level daily while on heparin Continue to monitor H&H and platelets F/u transition to oral [facility]  Thank you for allowing [place] to participate in this patients care.  Antonietta Jewel, PharmD, BCCCP Clinical Pharmacist  Phone: [phone] 03/21/2021 8:30 AM  Please check AMION for all [facility] [place] phone numbers After 10:00 PM, call [facility] [phone]

## 2021-03-21 NOTE — TOC Progression Note (Signed)
Transition of Care [facility]) - Progression Note    Patient Details  Name: Heather Saunders MRN: [id number] Date of Birth: 09-08-1925  [facility]) CM/SW [place], [place] Phone Number: 03/21/2021, 4:01 PM  Clinical Narrative:     CSW met with pt and provided list of bed offers. CSW explained some of the SNF locations and transition process. Pt will review list and discuss with friends/family. TOC to follow up for decision  Expected Discharge Plan: [place] Barriers to Discharge: Continued Medical Work up, SNF Pending bed offer, Ship broker  Expected Discharge Plan and Services Expected Discharge Plan: [place]: [place] arrangements for the past 2 months: [place]                                       Social Determinants of Health (SDOH) Interventions    Readmission Risk Interventions No flowsheet data found.

## 2021-03-21 NOTE — Progress Notes (Signed)
PHARMACY - PHYSICIAN COMMUNICATION CRITICAL VALUE ALERT - BLOOD CULTURE IDENTIFICATION (BCID)  Heather Saunders is an 87 y.o. female who presented to [facility] on 03/17/2021 with a chief complaint of not feeling well / new onset AFib  Assessment:  1 of 4 blood culture bottles growing staph epi.    Name of physician (or Provider) Contacted: Dr. Cathlean Sauer  Current antibiotics: none  Changes to prescribed antibiotics recommended: None - suspect contamination   Results for orders placed or performed during the hospital encounter of 03/17/21  Blood Culture ID Panel (Reflexed) (Collected: 03/20/2021 10:42 AM)  Result Value Ref Range   Enterococcus faecalis NOT DETECTED NOT DETECTED   Enterococcus Faecium NOT DETECTED NOT DETECTED   Listeria monocytogenes NOT DETECTED NOT DETECTED   Staphylococcus species DETECTED (A) NOT DETECTED   Staphylococcus aureus (BCID) NOT DETECTED NOT DETECTED   Staphylococcus epidermidis DETECTED (A) NOT DETECTED   Staphylococcus lugdunensis NOT DETECTED NOT DETECTED   Streptococcus species NOT DETECTED NOT DETECTED   Streptococcus agalactiae NOT DETECTED NOT DETECTED   Streptococcus pneumoniae NOT DETECTED NOT DETECTED   Streptococcus pyogenes NOT DETECTED NOT DETECTED   A.calcoaceticus-baumannii NOT DETECTED NOT DETECTED   Bacteroides fragilis NOT DETECTED NOT DETECTED   Enterobacterales NOT DETECTED NOT DETECTED   Enterobacter cloacae complex NOT DETECTED NOT DETECTED   Escherichia coli NOT DETECTED NOT DETECTED   Klebsiella aerogenes NOT DETECTED NOT DETECTED   Klebsiella oxytoca NOT DETECTED NOT DETECTED   Klebsiella pneumoniae NOT DETECTED NOT DETECTED   Proteus species NOT DETECTED NOT DETECTED   Salmonella species NOT DETECTED NOT DETECTED   Serratia marcescens NOT DETECTED NOT DETECTED   Haemophilus influenzae NOT DETECTED NOT DETECTED   Neisseria meningitidis NOT DETECTED NOT DETECTED   Pseudomonas aeruginosa NOT DETECTED NOT DETECTED    Stenotrophomonas maltophilia NOT DETECTED NOT DETECTED   Candida albicans NOT DETECTED NOT DETECTED   Candida auris NOT DETECTED NOT DETECTED   Candida glabrata NOT DETECTED NOT DETECTED   Candida krusei NOT DETECTED NOT DETECTED   Candida parapsilosis NOT DETECTED NOT DETECTED   Candida tropicalis NOT DETECTED NOT DETECTED   Cryptococcus neoformans/gattii NOT DETECTED NOT DETECTED   Methicillin resistance mecA/C NOT DETECTED NOT DETECTED    Candie [place] 03/21/2021  8:15 AM

## 2021-03-21 NOTE — Progress Notes (Signed)
[facility] ROUNDING NOTE   Subjective:   Interval History: 87 year old with history of coronary disease hypertension dyslipidemia was found to be in atrial fibrillation rapid ventricular rate.  She has a history of chronic disease with baseline creatinine 1.6 to 1.9 mg/dL.  She was recently started on lisinopril by primary physician for hypertension.  She is felt to be volume depleted.  Creatinine 5 mg deciliter on admit.  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  Suspect thrombus in setting of Afib, would also check blood cultures to evaluate for endocarditis.  Cardioversion deferred in setting of suspected RA thrombus.   Blood pressure 106 7/92 pulse 91 temperature 98.7 O2 sats 90% room air  Urine output 225 cc recorded 03/21/2021  Sodium 137 potassium 4.4 chloride 110 CO2 19 BUN 63 creatinine 2.67 glucose 143 calcium 8.5 hemoglobin 10.7  Objective:  Vital signs in last 24 hours:  Temp:  [97.9 F (36.6 C)-98.7 F (37.1 C)] 98.7 F (37.1 C) (06/22 0700) Pulse Rate:  [91-117] 117 (06/22 0800) Resp:  [20-29] 23 (06/22 0800) BP: (125-167)/(58-92) 167/92 (06/22 0800) SpO2:  [92 %-100 %] 92 % (06/22 0800) Weight:  [54.2 kg] 54.2 kg (06/22 0445)  Weight change: 0.7 kg Filed Weights   03/19/21 0326 03/20/21 0356 03/21/21 0445  Weight: 53 kg 53.5 kg 54.2 kg    Intake/Output: I/O last 3 completed shifts: In: 1467.1 [P.O.:720; I.V.:747.1] Out: 0    Intake/Output this shift:  Total I/O In: 480 [P.O.:480] Out: 225 [Urine:225]  Gen alert, no distress No rash, cyanosis or gangrene Sclera anicteric, throat clear No jvd or bruits Chest clear bilat to bases, no rales/ wheezing RRR no MRG Abd soft ntnd no mass or ascites +bs GU deferred MS no joint effusions or deformity Ext no LE or UE edema, no wounds or ulcers Neuro is alert, Ox 3 , nf   Basic Metabolic Panel: Recent Labs  Lab 03/17/21 2315 03/18/21 0037 03/18/21 0500 03/19/21 0309  03/20/21 0007 03/21/21 0035  NA 135 135 136 137 140 137  K 4.3 4.6 4.7 4.3 4.0 4.4  CL 107 107 106 107 110 110  CO2 14* 14* 17* 17* 19* 19*  GLUCOSE 118* 122* 106* 120* 111* 143*  BUN 80* 77* 79* 77* 67* 63*  CREATININE 5.04* 5.05* 4.76* 3.86* 2.91* 2.67*  CALCIUM 8.6* 8.8* 8.6* 8.5* 8.6* 8.5*  MG 2.0  --  2.2  --   --   --   PHOS  --   --  3.6 2.9 2.6  --      Liver Function Tests: Recent Labs  Lab 03/18/21 0500 03/18/21 0808 03/19/21 0309 03/20/21 0007  AST  --  28  --   --   ALT  --  37  --   --   ALKPHOS  --  77  --   --   BILITOT  --  1.3*  --   --   PROT  --  5.3*  --   --   ALBUMIN 3.0* 2.9* 2.8* 2.9*    No results for input(s): LIPASE, AMYLASE in the last 168 hours. No results for input(s): AMMONIA in the last 168 hours.  CBC: Recent Labs  Lab 03/17/21 2315 03/18/21 0500 03/19/21 0309 03/20/21 0007  WBC 13.4* 12.3* 13.7* 15.8*  HGB 10.8* 10.4* 11.3* 10.7*  HCT 33.9* 32.6* 34.6* 33.7*  MCV 87.8 87.2 86.1 86.2  PLT 194 203 220 254     Cardiac Enzymes: No results for  input(s): CKTOTAL, CKMB, CKMBINDEX, TROPONINI in the last 168 hours.  BNP: Invalid input(s): POCBNP  CBG: No results for input(s): GLUCAP in the last 168 hours.  Microbiology: Results for orders placed or performed during the hospital encounter of 03/17/21  Resp Panel by RT-PCR (Flu A&B, Covid) Nasopharyngeal Swab     Status: None   Collection Time: 03/17/21 11:33 PM   Specimen: Nasopharyngeal Swab; Nasopharyngeal(NP) swabs in vial transport medium  Result Value Ref Range Status   SARS Coronavirus 2 by RT PCR NEGATIVE NEGATIVE Final    Comment: (NOTE) SARS-CoV-2 target nucleic acids are NOT DETECTED.  The SARS-CoV-2 RNA is generally detectable in upper respiratory specimens during the acute phase of infection. The lowest concentration of SARS-CoV-2 viral copies this assay can detect is 138 copies/mL. A negative result does not preclude SARS-Cov-2 infection and should not be  used as the sole basis for treatment or other patient management decisions. A negative result may occur with  improper specimen collection/handling, submission of specimen other than nasopharyngeal swab, presence of viral mutation(s) within the areas targeted by this assay, and inadequate number of viral copies(<138 copies/mL). A negative result must be combined with clinical observations, patient history, and epidemiological information. The expected result is Negative.  Fact Sheet for Patients:  EntrepreneurPulse.com.au  Fact Sheet for Healthcare Providers:  IncredibleEmployment.be  This test is no t yet approved or cleared by the [place] FDA and  has been authorized for detection and/or diagnosis of SARS-CoV-2 by FDA under an Emergency Use Authorization (EUA). This EUA will remain  in effect (meaning this test can be used) for the duration of the COVID-19 declaration under Section 564(b)(1) of the Act, 21 U.S.C.section 360bbb-3(b)(1), unless the authorization is terminated  or revoked sooner.       Influenza A by PCR NEGATIVE NEGATIVE Final   Influenza B by PCR NEGATIVE NEGATIVE Final    Comment: (NOTE) The Xpert Xpress SARS-CoV-2/FLU/RSV plus assay is intended as an aid in the diagnosis of influenza from Nasopharyngeal swab specimens and should not be used as a sole basis for treatment. Nasal washings and aspirates are unacceptable for Xpert Xpress SARS-CoV-2/FLU/RSV testing.  Fact Sheet for Patients: EntrepreneurPulse.com.au  Fact Sheet for Healthcare Providers: IncredibleEmployment.be  This test is not yet approved or cleared by the [place] FDA and has been authorized for detection and/or diagnosis of SARS-CoV-2 by FDA under an Emergency Use Authorization (EUA). This EUA will remain in effect (meaning this test can be used) for the duration of the COVID-19 declaration under Section  564(b)(1) of the Act, 21 U.S.C. section 360bbb-3(b)(1), unless the authorization is terminated or revoked.  Performed at [facility], [place] [address] [place], [place] [address]   Culture, blood (routine x 2)     Status: None (Preliminary result)   Collection Time: 03/20/21 10:42 AM   Specimen: BLOOD  Result Value Ref Range Status   Specimen Description BLOOD RIGHT ANTECUBITAL  Final   Special Requests   Final    BOTTLES DRAWN AEROBIC AND ANAEROBIC Blood Culture adequate volume   Culture   Final    NO GROWTH < 24 HOURS Performed at [facility], [place] [address] [place], [place] [address]    Report Status PENDING  Incomplete  Culture, blood (routine x 2)     Status: None (Preliminary result)   Collection Time: 03/20/21 10:42 AM   Specimen: BLOOD RIGHT ARM  Result Value Ref Range Status   Specimen Description BLOOD RIGHT  ARM  Final   Special Requests   Final    BOTTLES DRAWN AEROBIC AND ANAEROBIC Blood Culture adequate volume   Culture  Setup Time   Final    GRAM POSITIVE COCCI AEROBIC BOTTLE ONLY CRITICAL RESULT CALLED TO, READ BACK BY AND VERIFIED WITH: PHARMD J.FRENS AT 0813 ON 03/21/2021 BY T.SAAD. Performed at [facility], [place] [address] [place], [place] [address]    Culture GRAM POSITIVE COCCI  Final   Report Status PENDING  Incomplete  Blood Culture ID Panel (Reflexed)     Status: Abnormal   Collection Time: 03/20/21 10:42 AM  Result Value Ref Range Status   Enterococcus faecalis NOT DETECTED NOT DETECTED Final   Enterococcus Faecium NOT DETECTED NOT DETECTED Final   Listeria monocytogenes NOT DETECTED NOT DETECTED Final   Staphylococcus species DETECTED (A) NOT DETECTED Final    Comment: CRITICAL RESULT CALLED TO, READ BACK BY AND VERIFIED WITH: PHARMD J.FRENS AT [id number] ON 03/21/2021 BY .T.SAAD.    Staphylococcus aureus (BCID) NOT DETECTED NOT DETECTED Final   Staphylococcus epidermidis DETECTED (A) NOT DETECTED Final    Comment: CRITICAL RESULT  CALLED TO, READ BACK BY AND VERIFIED WITH: PHARMD J.FRENS AT [id number] ON 03/21/2021 BY .T.SAAD.    Staphylococcus lugdunensis NOT DETECTED NOT DETECTED Final   Streptococcus species NOT DETECTED NOT DETECTED Final   Streptococcus agalactiae NOT DETECTED NOT DETECTED Final   Streptococcus pneumoniae NOT DETECTED NOT DETECTED Final   Streptococcus pyogenes NOT DETECTED NOT DETECTED Final   A.calcoaceticus-baumannii NOT DETECTED NOT DETECTED Final   Bacteroides fragilis NOT DETECTED NOT DETECTED Final   Enterobacterales NOT DETECTED NOT DETECTED Final   Enterobacter cloacae complex NOT DETECTED NOT DETECTED Final   Escherichia coli NOT DETECTED NOT DETECTED Final   Klebsiella aerogenes NOT DETECTED NOT DETECTED Final   Klebsiella oxytoca NOT DETECTED NOT DETECTED Final   Klebsiella pneumoniae NOT DETECTED NOT DETECTED Final   Proteus species NOT DETECTED NOT DETECTED Final   Salmonella species NOT DETECTED NOT DETECTED Final   Serratia marcescens NOT DETECTED NOT DETECTED Final   Haemophilus influenzae NOT DETECTED NOT DETECTED Final   Neisseria meningitidis NOT DETECTED NOT DETECTED Final   Pseudomonas aeruginosa NOT DETECTED NOT DETECTED Final   Stenotrophomonas maltophilia NOT DETECTED NOT DETECTED Final   Candida albicans NOT DETECTED NOT DETECTED Final   Candida auris NOT DETECTED NOT DETECTED Final   Candida glabrata NOT DETECTED NOT DETECTED Final   Candida krusei NOT DETECTED NOT DETECTED Final   Candida parapsilosis NOT DETECTED NOT DETECTED Final   Candida tropicalis NOT DETECTED NOT DETECTED Final   Cryptococcus neoformans/gattii NOT DETECTED NOT DETECTED Final   Methicillin resistance mecA/C NOT DETECTED NOT DETECTED Final    Comment: Performed at [facility], [address] [place], [place] [address]    Coagulation Studies: Recent Labs    03/19/21 1813  LABPROT 14.0  INR 1.1     Urinalysis: No results for input(s): COLORURINE, LABSPEC, PHURINE, GLUCOSEU,  HGBUR, BILIRUBINUR, KETONESUR, PROTEINUR, UROBILINOGEN, NITRITE, LEUKOCYTESUR in the last 72 hours.  Invalid input(s): APPERANCEUR     Imaging: DG CHEST PORT 1 VIEW  Result Date: 03/21/2021 CLINICAL DATA:  Atrial fibrillation.  Shortness of breath. EXAM: PORTABLE CHEST 1 VIEW COMPARISON:  03/19/2021.  03/17/2021. FINDINGS: Mediastinum and hilar structures normal. Cardiomegaly. Mild bilateral pulmonary interstitial prominence consistent with interstitial edema again noted. Similar findings noted on prior exam. Small bilateral pleural effusions noted. Findings suggest CHF. No pneumothorax. Degenerative changes  scoliosis thoracic spine. IMPRESSION: Cardiomegaly. Mild bilateral pulmonary interstitial prominence consistent interstitial edema again noted. Similar findings noted on prior exam. Small bilateral pleural effusions noted. Electronically Signed   By: Marcello Moores  Register   On: 03/21/2021 09:52   ECHOCARDIOGRAM COMPLETE  Result Date: 03/19/2021    ECHOCARDIOGRAM REPORT   Patient Name:   Heather Saunders Date of Exam: 03/19/2021 Medical Rec #:  [id number]        Height:       64.0 in Accession #:    [id number]       Weight:       116.8 lb Date of Birth:  Aug 17, 1925        BSA:          1.557 m Patient Age:    77 years         BP:           150/75 mmHg Patient Gender: F                HR:           107 bpm. Exam Location:  Inpatient Procedure: 2D Echo, Cardiac Doppler and Color Doppler Indications:    Atrial fibrillation                 Elevated troponin  History:        Patient has prior history of Echocardiogram examinations, most                 recent 10/29/2020. Previous Myocardial Infarction and CAD; Risk                 Factors:Dyslipidemia. CKD.  Sonographer:    Clayton Lefort RDCS (AE) Referring Phys: [id number] [place]  1. Left ventricular ejection fraction, by estimation, is 60 to 65%. The left ventricle has normal function. The left ventricle has no regional wall motion abnormalities.  There is severe left ventricular hypertrophy. Left ventricular diastolic function could not be evaluated.  2. Right ventricular systolic function is normal. The right ventricular size is normal. There is severely elevated pulmonary artery systolic pressure. The estimated right ventricular systolic pressure is [id number] mmHg.  3. Left atrial size was moderately dilated.  4. Mobile filamentous structure, which appears attached to the interatrial septum - consider thrombus or possible myxoma (image 67/68) - measuring ~0.6 x 2.0 cm. Right atrial size was mildly dilated.  5. The mitral valve is abnormal. Mild mitral valve regurgitation.  6. The aortic valve is tricuspid. Aortic valve regurgitation is mild. Mild aortic valve sclerosis is present, with no evidence of aortic valve stenosis.  7. The inferior vena cava is dilated in size with >50% respiratory variability, suggesting right atrial pressure of 8 mmHg. Comparison(s): Changes from prior study are noted. 10/29/2020: LVEF 60-65%, mobile RA mass can be seen but not noted in this study. FINDINGS  Left Ventricle: Left ventricular ejection fraction, by estimation, is 60 to 65%. The left ventricle has normal function. The left ventricle has no regional wall motion abnormalities. The left ventricular internal cavity size was normal in size. There is  severe left ventricular hypertrophy. Left ventricular diastolic function could not be evaluated due to atrial fibrillation. Left ventricular diastolic function could not be evaluated. Right Ventricle: The right ventricular size is normal. No increase in right ventricular wall thickness. Right ventricular systolic function is normal. There is severely elevated pulmonary artery systolic pressure. The tricuspid regurgitant velocity is 3.63 m/s, and with an assumed right atrial  pressure of 8 mmHg, the estimated right ventricular systolic pressure is [id number] mmHg. Left Atrium: Left atrial size was moderately dilated. Right Atrium: Mobile  filamentous structure, which appears attached to the interatrial septum - consider thrombus or possible myxoma - measuring ~0.6 x 2.0 cm. Right atrial size was mildly dilated. Pericardium: There is no evidence of pericardial effusion. Mitral Valve: The mitral valve is abnormal. There is mild thickening of the mitral valve leaflet(s). There is mild calcification of the mitral valve leaflet(s). Mild mitral valve regurgitation. Tricuspid Valve: The tricuspid valve is grossly normal. Tricuspid valve regurgitation is trivial. Aortic Valve: The aortic valve is tricuspid. Aortic valve regurgitation is mild. Aortic regurgitation PHT measures 676 msec. Mild aortic valve sclerosis is present, with no evidence of aortic valve stenosis. Aortic valve mean gradient measures 3.0 mmHg. Aortic valve peak gradient measures 4.9 mmHg. Aortic valve area, by VTI measures 4.19 cm. Pulmonic Valve: The pulmonic valve was normal in structure. Pulmonic valve regurgitation is not visualized. Aorta: The aortic root and ascending aorta are structurally normal, with no evidence of dilitation. Venous: The inferior vena cava is dilated in size with greater than 50% respiratory variability, suggesting right atrial pressure of 8 mmHg. IAS/Shunts: No atrial level shunt detected by color flow Doppler.  LEFT VENTRICLE PLAX 2D LVIDd:         2.90 cm LVIDs:         2.40 cm LV PW:         1.60 cm LV IVS:        1.80 cm LVOT diam:     2.10 cm LV SV:         74 LV SV Index:   47 LVOT Area:     3.46 cm  RIGHT VENTRICLE             IVC RV S prime:     10.20 cm/s  IVC diam: 2.50 cm TAPSE (M-mode): 1.5 cm LEFT ATRIUM             Index       RIGHT ATRIUM           Index LA diam:        3.40 cm 2.18 cm/m  RA Area:     10.70 cm LA Vol (A2C):   43.5 ml 27.94 ml/m RA Volume:   20.60 ml  13.23 ml/m LA Vol (A4C):   76.7 ml 49.27 ml/m LA Biplane Vol: 62.9 ml 40.40 ml/m  AORTIC VALVE AV Area (Vmax):    3.63 cm AV Area (Vmean):   3.40 cm AV Area (VTI):     4.19  cm AV Vmax:           110.80 cm/s AV Vmean:          81.600 cm/s AV VTI:            0.176 m AV Peak Grad:      4.9 mmHg AV Mean Grad:      3.0 mmHg LVOT Vmax:         116.20 cm/s LVOT Vmean:        80.120 cm/s LVOT VTI:          0.213 m LVOT/AV VTI ratio: 1.21 AI PHT:            676 msec  AORTA Ao Root diam: 3.50 cm Ao Asc diam:  3.40 cm TRICUSPID VALVE TR Peak grad:   52.7 mmHg TR Vmax:  363.00 cm/s  SHUNTS Systemic VTI:  0.21 m Systemic Diam: 2.10 cm Lyman Bishop MD Electronically signed by Lyman Bishop MD Signature Date/Time: 03/19/2021/6:09:56 PM    Final    ECHO TEE  Result Date: 03/20/2021    TRANSESOPHOGEAL ECHO REPORT   Patient Name:   SALISA BANAHAN Date of Exam: 03/20/2021 Medical Rec #:  [id number]        Height:       64.0 in Accession #:    [id number]       Weight:       117.9 lb Date of Birth:  1925-06-26        BSA:          1.563 m Patient Age:    89 years         BP:           132/57 mmHg Patient Gender: F                HR:           93 bpm. Exam Location:  Inpatient Procedure: Transesophageal Echo, Cardiac Doppler and Color Doppler Indications:     Atrial fibrillation  History:         Patient has prior history of Echocardiogram examinations, most                  recent 03/19/2021. CAD, Arrythmias:Atrial Fibrillation,                  Signs/Symptoms:CKD; Risk Factors:Hypertension.  Sonographer:     Dustin Flock Referring Phys:  [id number] Leanor Kail Diagnosing Phys: Oswaldo Milian MD PROCEDURE: The transesophogeal probe was passed without difficulty through the esophogus of the patient. Sedation performed by different physician. The patient was monitored while under deep sedation. Anesthestetic sedation was provided intravenously by Anesthesiology: 152.'48mg'$  of Propofol. The patient developed no complications during the procedure. IMPRESSIONS  1. Mobile filamentous mass measuring 1.7cm x 0.6cm in the right atrium, appears attached to eustachian valve and extends to the  interatrial septum. Suspect likely thrombus but would also recommend checking blood cultures to evaluate for endocarditis. Cardioversion deferred given suspected RA thrombus  2. Left ventricular ejection fraction, by estimation, is 60 to 65%. The left ventricle has normal function. There is severe left ventricular hypertrophy.  3. Right ventricular systolic function is normal. The right ventricular size is normal.  4. Left atrial size was mildly dilated. No left atrial/left atrial appendage thrombus was detected.  5. The mitral valve is normal in structure. Mild mitral valve regurgitation.  6. The aortic valve is tricuspid. Aortic valve regurgitation is mild. Mild aortic valve sclerosis is present, with no evidence of aortic valve stenosis.  7. Pulmonic valve regurgitation is mild to moderate. FINDINGS  Left Ventricle: Left ventricular ejection fraction, by estimation, is 60 to 65%. The left ventricle has normal function. The left ventricular internal cavity size was small. There is severe left ventricular hypertrophy. Right Ventricle: The right ventricular size is normal. No increase in right ventricular wall thickness. Right ventricular systolic function is normal. Left Atrium: Left atrial size was mildly dilated. No left atrial/left atrial appendage thrombus was detected. Right Atrium: Right atrial size was normal in size. Pericardium: There is no evidence of pericardial effusion. Mitral Valve: The mitral valve is normal in structure. Mild mitral valve regurgitation. Tricuspid Valve: The tricuspid valve is normal in structure. Tricuspid valve regurgitation is mild. Aortic Valve: The aortic valve is tricuspid. Aortic valve regurgitation is mild. Mild  aortic valve sclerosis is present, with no evidence of aortic valve stenosis. Aortic valve peak gradient measures 10.0 mmHg. Pulmonic Valve: The pulmonic valve was grossly normal. Pulmonic valve regurgitation is moderate. Aorta: The aortic root and ascending aorta are  structurally normal, with no evidence of dilitation. IAS/Shunts: No atrial level shunt detected by color flow Doppler.  AORTIC VALVE AV Vmax:      158.00 cm/s AV Peak Grad: 10.0 mmHg Oswaldo Milian MD Electronically signed by Oswaldo Milian MD Signature Date/Time: 03/20/2021/10:59:34 AM    Final      Medications:    heparin 800 Units/hr (03/21/21 0653)    atorvastatin  40 mg Oral Daily   ezetimibe  10 mg Oral Daily   metoprolol tartrate  100 mg Oral BID   multivitamin with minerals  1 tablet Oral Daily   sodium bicarbonate  650 mg Oral BID   acetaminophen, melatonin, metoprolol tartrate, polyethylene glycol, prochlorperazine  Assessment/ Plan:  Acute kidney injury Baseline serum creatinine about 2 mg/dL.  Creatinine appears to be improving.  Urine output present.  We will continue to follow.  Urinalysis bland.   .  Continue to avoid nonsteroidal steroidal anti-inflammatory drugs as well as ACE inhibitor's and ARB's.  Lisinopril held.  Renal function appears to be improving slowly. Metabolic acidosis continue oral sodium bicarbonate Atrial fibrillation rate control per cardiology Hypertension stable. Atrial thrombus IV heparin started per primary service   LOS: 3 Sherril Croon '@TODAY''@10'$ :40 AM

## 2021-03-21 NOTE — Progress Notes (Signed)
Lower extremity venous has been completed.   Preliminary results in CV Proc.   Heather Saunders 03/21/2021 1:41 PM

## 2021-03-21 NOTE — Progress Notes (Addendum)
Progress Note  Patient Name: Heather Saunders Date of Encounter: 03/21/2021  [facility] Cardiologist: Dorris Carnes, MD   Subjective   No acute overnight events. No chest pain or shortness of breath. No recurrent throat pain. Still feels fatigued.  Inpatient Medications    Scheduled Meds:  atorvastatin  40 mg Oral Daily   ezetimibe  10 mg Oral Daily   metoprolol tartrate  75 mg Oral BID   multivitamin with minerals  1 tablet Oral Daily   sodium bicarbonate  650 mg Oral BID   Continuous Infusions:  heparin 800 Units/hr (03/21/21 0653)   PRN Meds: acetaminophen, melatonin, metoprolol tartrate, polyethylene glycol, prochlorperazine   Vital Signs    Vitals:   03/21/21 0300 03/21/21 0445 03/21/21 0700 03/21/21 0800  BP: 139/79  (!) 130/58 (!) 167/92  Pulse: 98  91 (!) 117  Resp: 20  (!) 29 (!) 23  Temp: 98.7 F (37.1 C)  98.7 F (37.1 C)   TempSrc: Oral  Oral   SpO2: 93%  93% 92%  Weight:  54.2 kg    Height:        Intake/Output Summary (Last 24 hours) at 03/21/2021 0937 Last data filed at 03/21/2021 0844 Gross per 24 hour  Intake 1146.35 ml  Output 225 ml  Net 921.35 ml   Last 3 Weights 03/21/2021 03/20/2021 03/19/2021  Weight (lbs) 119 lb 7.8 oz 117 lb 15.1 oz 116 lb 13.5 oz  Weight (kg) 54.2 kg 53.5 kg 53 kg      Telemetry    Atrial fibrillation with rates in the 80s to 90s at rest but quickly jumps as high as the 120s with minimal activity. Few episodes in the 130s.  - Personally Reviewed  ECG    No new ECG tracing today. - Personally Reviewed  Physical Exam   GEN: No acute distress.   Neck: No JVD. Cardiac: Mildly tachycardic with irregularly irregular rhythm. No murmurs, rubs, or gallops.  Respiratory: No increased work of breathing. Mild crackles in bibasilar bases. No wheezes or rhonchi. GI: Soft, non-distended, and non-tender.  MS: No lower extremity edema. No deformity. Skin: Warm and dry. Neuro:  No focal deficits. Psych: Normal affect.  Responds appropriately.  Labs    High Sensitivity Troponin:   Recent Labs  Lab 03/17/21 2333 03/18/21 0037  TROPONINIHS 53* 51*      Chemistry Recent Labs  Lab 03/18/21 0808 03/19/21 0309 03/20/21 0007 03/21/21 0035  NA  --  137 140 137  K  --  4.3 4.0 4.4  CL  --  107 110 110  CO2  --  17* 19* 19*  GLUCOSE  --  120* 111* 143*  BUN  --  77* 67* 63*  CREATININE  --  3.86* 2.91* 2.67*  CALCIUM  --  8.5* 8.6* 8.5*  PROT 5.3*  --   --   --   ALBUMIN 2.9* 2.8* 2.9*  --   AST 28  --   --   --   ALT 37  --   --   --   ALKPHOS 77  --   --   --   BILITOT 1.3*  --   --   --   GFRNONAA  --  10* 14* 16*  ANIONGAP  --  '13 11 8     '$ Hematology Recent Labs  Lab 03/18/21 0500 03/19/21 0309 03/20/21 0007  WBC 12.3* 13.7* 15.8*  RBC 3.74* 4.02 3.91  HGB 10.4* 11.3* 10.7*  HCT  32.6* 34.6* 33.7*  MCV 87.2 86.1 86.2  MCH 27.8 28.1 27.4  MCHC 31.9 32.7 31.8  RDW 15.7* 15.6* 15.5  PLT 203 220 254    BNP Recent Labs  Lab 03/18/21 0038  BNP 815.8*     DDimer No results for input(s): DDIMER in the last 168 hours.   Radiology    ECHOCARDIOGRAM COMPLETE  Result Date: 03/19/2021    ECHOCARDIOGRAM REPORT   Patient Name:   MILCA MCERLEAN Date of Exam: 03/19/2021 Medical Rec #:  [id number]        Height:       64.0 in Accession #:    [id number]       Weight:       116.8 lb Date of Birth:  [birth year]        BSA:          1.557 m Patient Age:    87 years         BP:           150/75 mmHg Patient Gender: F                HR:           107 bpm. Exam Location:  Inpatient Procedure: 2D Echo, Cardiac Doppler and Color Doppler Indications:    Atrial fibrillation                 Elevated troponin  History:        Patient has prior history of Echocardiogram examinations, most                 recent 10/29/2020. Previous Myocardial Infarction and CAD; Risk                 Factors:Dyslipidemia. CKD.  Sonographer:    Clayton Lefort RDCS (AE) Referring Phys: [id number] [place]  1. Left  ventricular ejection fraction, by estimation, is 60 to 65%. The left ventricle has normal function. The left ventricle has no regional wall motion abnormalities. There is severe left ventricular hypertrophy. Left ventricular diastolic function could not be evaluated.  2. Right ventricular systolic function is normal. The right ventricular size is normal. There is severely elevated pulmonary artery systolic pressure. The estimated right ventricular systolic pressure is [id number] mmHg.  3. Left atrial size was moderately dilated.  4. Mobile filamentous structure, which appears attached to the interatrial septum - consider thrombus or possible myxoma (image 67/68) - measuring ~0.6 x 2.0 cm. Right atrial size was mildly dilated.  5. The mitral valve is abnormal. Mild mitral valve regurgitation.  6. The aortic valve is tricuspid. Aortic valve regurgitation is mild. Mild aortic valve sclerosis is present, with no evidence of aortic valve stenosis.  7. The inferior vena cava is dilated in size with >50% respiratory variability, suggesting right atrial pressure of 8 mmHg. Comparison(s): Changes from prior study are noted. 10/29/2020: LVEF 60-65%, mobile RA mass can be seen but not noted in this study. FINDINGS  Left Ventricle: Left ventricular ejection fraction, by estimation, is 60 to 65%. The left ventricle has normal function. The left ventricle has no regional wall motion abnormalities. The left ventricular internal cavity size was normal in size. There is  severe left ventricular hypertrophy. Left ventricular diastolic function could not be evaluated due to atrial fibrillation. Left ventricular diastolic function could not be evaluated. Right Ventricle: The right ventricular size is normal. No increase in right ventricular wall thickness. Right ventricular systolic function is normal.  There is severely elevated pulmonary artery systolic pressure. The tricuspid regurgitant velocity is 3.63 m/s, and with an assumed right  atrial pressure of 8 mmHg, the estimated right ventricular systolic pressure is [id number] mmHg. Left Atrium: Left atrial size was moderately dilated. Right Atrium: Mobile filamentous structure, which appears attached to the interatrial septum - consider thrombus or possible myxoma - measuring ~0.6 x 2.0 cm. Right atrial size was mildly dilated. Pericardium: There is no evidence of pericardial effusion. Mitral Valve: The mitral valve is abnormal. There is mild thickening of the mitral valve leaflet(s). There is mild calcification of the mitral valve leaflet(s). Mild mitral valve regurgitation. Tricuspid Valve: The tricuspid valve is grossly normal. Tricuspid valve regurgitation is trivial. Aortic Valve: The aortic valve is tricuspid. Aortic valve regurgitation is mild. Aortic regurgitation PHT measures 676 msec. Mild aortic valve sclerosis is present, with no evidence of aortic valve stenosis. Aortic valve mean gradient measures 3.0 mmHg. Aortic valve peak gradient measures 4.9 mmHg. Aortic valve area, by VTI measures 4.19 cm. Pulmonic Valve: The pulmonic valve was normal in structure. Pulmonic valve regurgitation is not visualized. Aorta: The aortic root and ascending aorta are structurally normal, with no evidence of dilitation. Venous: The inferior vena cava is dilated in size with greater than 50% respiratory variability, suggesting right atrial pressure of 8 mmHg. IAS/Shunts: No atrial level shunt detected by color flow Doppler.  LEFT VENTRICLE PLAX 2D LVIDd:         2.90 cm LVIDs:         2.40 cm LV PW:         1.60 cm LV IVS:        1.80 cm LVOT diam:     2.10 cm LV SV:         74 LV SV Index:   47 LVOT Area:     3.46 cm  RIGHT VENTRICLE             IVC RV S prime:     10.20 cm/s  IVC diam: 2.50 cm TAPSE (M-mode): 1.5 cm LEFT ATRIUM             Index       RIGHT ATRIUM           Index LA diam:        3.40 cm 2.18 cm/m  RA Area:     10.70 cm LA Vol (A2C):   43.5 ml 27.94 ml/m RA Volume:   20.60 ml  13.23 ml/m  LA Vol (A4C):   76.7 ml 49.27 ml/m LA Biplane Vol: 62.9 ml 40.40 ml/m  AORTIC VALVE AV Area (Vmax):    3.63 cm AV Area (Vmean):   3.40 cm AV Area (VTI):     4.19 cm AV Vmax:           110.80 cm/s AV Vmean:          81.600 cm/s AV VTI:            0.176 m AV Peak Grad:      4.9 mmHg AV Mean Grad:      3.0 mmHg LVOT Vmax:         116.20 cm/s LVOT Vmean:        80.120 cm/s LVOT VTI:          0.213 m LVOT/AV VTI ratio: 1.21 AI PHT:            676 msec  AORTA Ao Root diam: 3.50 cm Ao Asc diam:  3.40 cm TRICUSPID VALVE TR Peak grad:   52.7 mmHg TR Vmax:        363.00 cm/s  SHUNTS Systemic VTI:  0.21 m Systemic Diam: 2.10 cm Lyman Bishop MD Electronically signed by Lyman Bishop MD Signature Date/Time: 03/19/2021/6:09:56 PM    Final    ECHO TEE  Result Date: 03/20/2021    TRANSESOPHOGEAL ECHO REPORT   Patient Name:   NIVEAH NOLLE Date of Exam: 03/20/2021 Medical Rec #:  [id number]        Height:       64.0 in Accession #:    [id number]       Weight:       117.9 lb Date of Birth:  [birth year]        BSA:          1.563 m Patient Age:    89 years         BP:           132/57 mmHg Patient Gender: F                HR:           93 bpm. Exam Location:  Inpatient Procedure: Transesophageal Echo, Cardiac Doppler and Color Doppler Indications:     Atrial fibrillation  History:         Patient has prior history of Echocardiogram examinations, most                  recent 03/19/2021. CAD, Arrythmias:Atrial Fibrillation,                  Signs/Symptoms:CKD; Risk Factors:Hypertension.  Sonographer:     Dustin Flock Referring Phys:  [id number] Leanor Kail Diagnosing Phys: Oswaldo Milian MD PROCEDURE: The transesophogeal probe was passed without difficulty through the esophogus of the patient. Sedation performed by different physician. The patient was monitored while under deep sedation. Anesthestetic sedation was provided intravenously by Anesthesiology: 152.'48mg'$  of Propofol. The patient developed no  complications during the procedure. IMPRESSIONS  1. Mobile filamentous mass measuring 1.7cm x 0.6cm in the right atrium, appears attached to eustachian valve and extends to the interatrial septum. Suspect likely thrombus but would also recommend checking blood cultures to evaluate for endocarditis. Cardioversion deferred given suspected RA thrombus  2. Left ventricular ejection fraction, by estimation, is 60 to 65%. The left ventricle has normal function. There is severe left ventricular hypertrophy.  3. Right ventricular systolic function is normal. The right ventricular size is normal.  4. Left atrial size was mildly dilated. No left atrial/left atrial appendage thrombus was detected.  5. The mitral valve is normal in structure. Mild mitral valve regurgitation.  6. The aortic valve is tricuspid. Aortic valve regurgitation is mild. Mild aortic valve sclerosis is present, with no evidence of aortic valve stenosis.  7. Pulmonic valve regurgitation is mild to moderate. FINDINGS  Left Ventricle: Left ventricular ejection fraction, by estimation, is 60 to 65%. The left ventricle has normal function. The left ventricular internal cavity size was small. There is severe left ventricular hypertrophy. Right Ventricle: The right ventricular size is normal. No increase in right ventricular wall thickness. Right ventricular systolic function is normal. Left Atrium: Left atrial size was mildly dilated. No left atrial/left atrial appendage thrombus was detected. Right Atrium: Right atrial size was normal in size. Pericardium: There is no evidence of pericardial effusion. Mitral Valve: The mitral valve is normal in structure. Mild mitral valve regurgitation. Tricuspid Valve: The tricuspid valve is normal  in structure. Tricuspid valve regurgitation is mild. Aortic Valve: The aortic valve is tricuspid. Aortic valve regurgitation is mild. Mild aortic valve sclerosis is present, with no evidence of aortic valve stenosis. Aortic valve  peak gradient measures 10.0 mmHg. Pulmonic Valve: The pulmonic valve was grossly normal. Pulmonic valve regurgitation is moderate. Aorta: The aortic root and ascending aorta are structurally normal, with no evidence of dilitation. IAS/Shunts: No atrial level shunt detected by color flow Doppler.  AORTIC VALVE AV Vmax:      158.00 cm/s AV Peak Grad: 10.0 mmHg Oswaldo Milian MD Electronically signed by Oswaldo Milian MD Signature Date/Time: 03/20/2021/10:59:34 AM    Final     Cardiac Studies   TTE 03/19/2021: Impressions: 1. Left ventricular ejection fraction, by estimation, is 60 to 65%. The  left ventricle has normal function. The left ventricle has no regional  wall motion abnormalities. There is severe left ventricular hypertrophy.  Left ventricular diastolic function  could not be evaluated.   2. Right ventricular systolic function is normal. The right ventricular  size is normal. There is severely elevated pulmonary artery systolic  pressure. The estimated right ventricular systolic pressure is [id number] mmHg.   3. Left atrial size was moderately dilated.   4. Mobile filamentous structure, which appears attached to the  interatrial septum - consider thrombus or possible myxoma (image 67/68) -  measuring ~0.6 x 2.0 cm. Right atrial size was mildly dilated.   5. The mitral valve is abnormal. Mild mitral valve regurgitation.   6. The aortic valve is tricuspid. Aortic valve regurgitation is mild.  Mild aortic valve sclerosis is present, with no evidence of aortic valve  stenosis.   7. The inferior vena cava is dilated in size with >50% respiratory  variability, suggesting right atrial pressure of 8 mmHg.   Comparison(s): Changes from prior study are noted. 10/29/2020: LVEF 60-65%,  mobile RA mass can be seen but not noted in this study.  _______________  TEE 03/20/2021: Impressions: 1. Mobile filamentous mass measuring 1.7cm x 0.6cm in the right atrium,  appears attached to  eustachian valve and extends to the interatrial  septum. Suspect likely thrombus but would also recommend checking blood  cultures to evaluate for endocarditis.  Cardioversion deferred given suspected RA thrombus   2. Left ventricular ejection fraction, by estimation, is 60 to 65%. The  left ventricle has normal function. There is severe left ventricular  hypertrophy.   3. Right ventricular systolic function is normal. The right ventricular  size is normal.   4. Left atrial size was mildly dilated. No left atrial/left atrial  appendage thrombus was detected.   5. The mitral valve is normal in structure. Mild mitral valve  regurgitation.   6. The aortic valve is tricuspid. Aortic valve regurgitation is mild.  Mild aortic valve sclerosis is present, with no evidence of aortic valve  stenosis.   7. Pulmonic valve regurgitation is mild to moderate.    Patient Profile     87 y.o. 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  Assessment & Plan    New Onset Atrial Fibrillation with RVR - Patient presented with worsening malaise for several weeks as well as sudden onset of throat discomfort. - TTE showed LVEF of 60-65% and severe LVH, severe pulmonary hypertension with PASP 61 mmHg, moderate left  atrial enlargement, mild MR and AR and possible thrombus or myxoma in the right atrium. - Initially plan was for TEE/DCCV. However, TTE and TEE should right atrial mass concerning for possible thrombus. Therefore, will focus on rate control for now. - Rates currently in the 80s to 90s at rest but quickly increase as high as the 120s with minimal activity such as moving around in bed. - Will increase Lopressor to '100mg'$  twice daily.  - Will stop IV Heparin and start Eliquis 2.'5mg'$   twice daily. Reduced dose due to age and renal function.  Right Atrium Thrombus - TTE showed mobile filamentous structure in right atrium concerning for possible thrombus or myxoma. - TEE showed mobile filamentous mass measuring 1.7cm x 0.6cm in the right atrium, appears to be attached to eustachian valve and extends to the interatrial septum. Likely thrombus but blood cultures pending to evaluate for endocarditis.  - If final blood cultures negative, will likely need repeat TEE in 1-2 month to re-evaluated thrombus.  Acute Diastolic CHF - Likely secondary to atrial fibrillation with RVR. - Chest x-ray showed pulmonary vascular congestion and interstitial edema.  - BNP elevated at 815. - Echo showed LVEF of 60-65%. - Mild bibasilar bases but does not appear significantly volume overloaded. - Net positive 3.9 L this admission. - No diuretics so far due to AKI. Will defer diuretics to Nephrology. - Continue to daily weights, strict I/O's, and renal function.  Nonobstructive CAD - Non-obstructive disease on remote cath in 2006. - No chest pain. Throat pain sounded more like a sore throat rather than anginal equivalent. - Continue beta-blocker and statin. - No aspirin due to need for DOAC.  Demand Ischemia - High-sensitivity troponin minimally elevated at 53 >> 51. - Normal LV function on Echo. - No chest pain or shortness of breath but she did note some throat pain on admission. Sounded more like a sore throat than anginal equivalent. - Suspect demand ischemia in setting of atrial fibrillation with RVR.  Hypertension - BP elevated. - Will increase Lopressor as above.  Pulmonary Hypertension - Moderate to severe pulmonary hypertension noted on Echo. PASP increased from 48 mmHg in 09/2020 to 62 mmHg this admission.  - V/Q scan ordered to rule out PE given right atrium thrombus.  AKI - Creatinine 5.04 on admission. Slowly improving. Creatinine 2.67 today. Baseline around 1.7 to 2.0.   - Urinalysis bland. - Nephrology consulted.    For questions or updates, please contact [facility] Please consult [email or web address] for contact info under        Signed, Darreld Mclean, PA-C  03/21/2021, 9:37 AM

## 2021-03-22 ENCOUNTER — Inpatient Hospital Stay (HOSPITAL_COMMUNITY): Payer: Medicare Other

## 2021-03-22 ENCOUNTER — Encounter (HOSPITAL_COMMUNITY): Payer: Self-pay | Admitting: Cardiology

## 2021-03-22 LAB — BASIC METABOLIC PANEL
Anion gap: 8 (ref 5–15)
BUN: 60 mg/dL — ABNORMAL HIGH (ref 8–23)
CO2: 18 mmol/L — ABNORMAL LOW (ref 22–32)
Calcium: 8.7 mg/dL — ABNORMAL LOW (ref 8.9–10.3)
Chloride: 110 mmol/L (ref 98–111)
Creatinine, Ser: 2.75 mg/dL — ABNORMAL HIGH (ref 0.44–1.00)
GFR, Estimated: 16 mL/min — ABNORMAL LOW (ref >60)
Glucose, Bld: 133 mg/dL — ABNORMAL HIGH (ref 70–99)
Potassium: 4.4 mmol/L (ref 3.5–5.1)
Sodium: 136 mmol/L (ref 135–145)

## 2021-03-22 LAB — SARS CORONAVIRUS 2 (TAT 6-24 HRS): SARS Coronavirus 2: NEGATIVE

## 2021-03-22 LAB — CBC WITH DIFFERENTIAL/PLATELET
Abs Immature Granulocytes: 0.18 10*3/uL — ABNORMAL HIGH (ref 0.00–0.07)
Basophils Absolute: 0.1 10*3/uL (ref 0.0–0.1)
Basophils Relative: 0 %
Eosinophils Absolute: 0.4 10*3/uL (ref 0.0–0.5)
Eosinophils Relative: 2 %
HCT: 31.3 % — ABNORMAL LOW (ref 36.0–46.0)
Hemoglobin: 10 g/dL — ABNORMAL LOW (ref 12.0–15.0)
Immature Granulocytes: 1 %
Lymphocytes Relative: 38 %
Lymphs Abs: 6.8 10*3/uL — ABNORMAL HIGH (ref 0.7–4.0)
MCH: 27.9 pg (ref 26.0–34.0)
MCHC: 31.9 g/dL (ref 30.0–36.0)
MCV: 87.4 fL (ref 80.0–100.0)
Monocytes Absolute: 0.8 10*3/uL (ref 0.1–1.0)
Monocytes Relative: 5 %
Neutro Abs: 9.7 10*3/uL — ABNORMAL HIGH (ref 1.7–7.7)
Neutrophils Relative %: 54 %
Platelets: 279 10*3/uL (ref 150–400)
RBC: 3.58 MIL/uL — ABNORMAL LOW (ref 3.87–5.11)
RDW: 15.9 % — ABNORMAL HIGH (ref 11.5–15.5)
WBC: 17.9 10*3/uL — ABNORMAL HIGH (ref 4.0–10.5)
nRBC: 0 % (ref 0.0–0.2)

## 2021-03-22 LAB — POCT I-STAT EG7
Acid-base deficit: 7 mmol/L — ABNORMAL HIGH (ref 0.0–2.0)
Bicarbonate: 15.7 mmol/L — ABNORMAL LOW (ref 20.0–28.0)
Calcium, Ion: 1.15 mmol/L (ref 1.15–1.40)
HCT: 30 % — ABNORMAL LOW (ref 36.0–46.0)
Hemoglobin: 10.2 g/dL — ABNORMAL LOW (ref 12.0–15.0)
O2 Saturation: 82 %
Potassium: 4.5 mmol/L (ref 3.5–5.1)
Sodium: 136 mmol/L (ref 135–145)
TCO2: 16 mmol/L — ABNORMAL LOW (ref 22–32)
pCO2, Ven: 23.5 mmHg — ABNORMAL LOW (ref 44.0–60.0)
pH, Ven: 7.433 — ABNORMAL HIGH (ref 7.250–7.430)
pO2, Ven: 43 mmHg (ref 32.0–45.0)

## 2021-03-22 MED ORDER — FUROSEMIDE 10 MG/ML IJ SOLN
60.0000 mg | Freq: Once | INTRAMUSCULAR | Status: AC
  Administered 2021-03-22: 60 mg via INTRAVENOUS
  Filled 2021-03-22: qty 6

## 2021-03-22 MED ORDER — AZITHROMYCIN 500 MG PO TABS
500.0000 mg | ORAL_TABLET | Freq: Every day | ORAL | Status: AC
  Administered 2021-03-22 – 2021-03-24 (×3): 500 mg via ORAL
  Filled 2021-03-22 (×3): qty 1

## 2021-03-22 MED ORDER — SODIUM CHLORIDE 0.9 % IV SOLN
2.0000 g | INTRAVENOUS | Status: DC
  Administered 2021-03-22 – 2021-03-23 (×2): 2 g via INTRAVENOUS
  Filled 2021-03-22 (×3): qty 20

## 2021-03-22 MED ORDER — SODIUM BICARBONATE 650 MG PO TABS
650.0000 mg | ORAL_TABLET | Freq: Three times a day (TID) | ORAL | Status: DC
  Administered 2021-03-22 – 2021-03-27 (×16): 650 mg via ORAL
  Filled 2021-03-22 (×15): qty 1

## 2021-03-22 NOTE — Progress Notes (Signed)
Chest radiograph personally reviewed, noted worsening bilateral interstitial infiltrates with bilateral pleural effusions.  Suggesting pulmonary edema due to volume overload, can not rule out underlying infectious process.   Considering worsening leukocytosis and worsening anion gap metabolic acidosis, patient may be developing severe sepsis.   Plan; Furosemide 60 mg IV x1 Ceftriaxone IV and Azithromycin po.

## 2021-03-22 NOTE — Progress Notes (Signed)
Physical Therapy Treatment Patient Details Name: Heather Saunders MRN: [id number] DOB: 10-20-1924 Today's Date: 03/22/2021    History of Present Illness Pt is a 87 y/o female presenting on 6/18 to ED for sudden onset of throat discomfort and concern for possible anginal equivalent. Pt found to have AKI on CKD . Pt with new onset a fib with RVR with pulmonary edema. On 6/21, pt had TEE with RA thrombus noted. PMH: coronary artery disease, essential hypertension, hyperlipidemia.    PT Comments    Pt pleasant and reports continued fatigue and SOB with activity making functional mobility more challenging. Pt with increased gait tolerance today with rollator on RA with SPO2 89-94%. Pt with HR up to 112 with gait. Pt educated for continued progression, gait trials and HEP. Pt would benefit from ST-SNf to maximize independence and activity tolerance.      Follow Up Recommendations  SNF     Equipment Recommendations  Other (comment) (rollator)    Recommendations for Other Services       Precautions / Restrictions Precautions Precautions: Fall;Other (comment) Precaution Comments: watch HR    Mobility  Bed Mobility               General bed mobility comments: pt sitting EOB on arrival    Transfers Overall transfer level: Needs assistance   Transfers: Sit to/from Stand Sit to Stand: Supervision;Modified independent (Device/Increase time)         General transfer comment: pt able to stand from bed with mod I, with use of rollator supervision with cues for locking brakes for seated rest  Ambulation/Gait Ambulation/Gait assistance: Supervision Gait Distance (Feet): 250 Feet Assistive device: 4-wheeled walker Gait Pattern/deviations: Step-through pattern;Decreased stride length   Gait velocity interpretation: 1.31 - 2.62 ft/sec, indicative of limited community ambulator General Gait Details: pt with use of rollator with cues for direction and avoiding obstacles pt able to  walk 250' then additional 150' after seated rest. HR 87-112 with SpO2 89-96% on RA   Stairs             Wheelchair Mobility    Modified Rankin (Stroke Patients Only)       Balance Overall balance assessment: Needs assistance   Sitting balance-Leahy Scale: Good Sitting balance - Comments: EOB without assist   Standing balance support: Bilateral upper extremity supported;No upper extremity supported Standing balance-Leahy Scale: Fair Standing balance comment: able to stand without AD, rollator for gait                            Cognition Arousal/Alertness: Awake/alert Behavior During Therapy: WFL for tasks assessed/performed Overall Cognitive Status: Within Functional Limits for tasks assessed                                        Exercises General Exercises - Lower Extremity Long Arc Quad: AROM;Both;10 reps;Seated    General Comments        Pertinent Vitals/Pain Pain Assessment: No/denies pain    Home Living                      Prior Function            PT Goals (current goals can now be found in the care plan section) Progress towards PT goals: Progressing toward goals    Frequency    Min 2X/week  PT Plan Current plan remains appropriate    Co-evaluation              AM-PAC PT "6 Clicks" Mobility   Outcome Measure  Help needed turning from your back to your side while in a flat bed without using bedrails?: None Help needed moving from lying on your back to sitting on the side of a flat bed without using bedrails?: None Help needed moving to and from a bed to a chair (including a wheelchair)?: None Help needed standing up from a chair using your arms (e.g., wheelchair or bedside chair)?: A Little Help needed to walk in hospital room?: A Little Help needed climbing 3-5 steps with a railing? : A Little 6 Click Score: 21    End of Session   Activity Tolerance: Patient tolerated treatment  well Patient left: in chair;with call bell/phone within reach Nurse Communication: Mobility status PT Visit Diagnosis: Unsteadiness on feet (R26.81);Other abnormalities of gait and mobility (R26.89);Muscle weakness (generalized) (M62.81);Difficulty in walking, not elsewhere classified (R26.2)     Time: [id number] PT Time Calculation (min) (ACUTE ONLY): 24 min  Charges:  $Gait Training: 8-22 mins $Therapeutic Activity: 8-22 mins                     Yonah Tangeman P, PT Acute Rehabilitation Services Pager: [phone] Office: [place] 03/22/2021, 1:19 PM

## 2021-03-22 NOTE — Progress Notes (Signed)
[facility] ROUNDING NOTE   Subjective:   Interval History: 87 year old with history of coronary disease hypertension dyslipidemia was found to be in atrial fibrillation rapid ventricular rate.  She has a history of chronic disease with baseline creatinine 1.6 to 1.9 mg/dL.  She was recently started on lisinopril by primary physician for hypertension.  She is felt to be volume depleted.  Creatinine 5 mg deciliter on admit.  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  Suspect thrombus in setting of Afib, would also check blood cultures to evaluate for endocarditis.  Cardioversion deferred in setting of suspected RA thrombus.   Blood pressure 134/87 pulse 80 temperature 99 O2 sats 90 room air  Urine output 225 cc recorded 03/21/2021  Sodium 136 potassium 4.4 chloride 110 CO2 18 BUN 60 creatinine 2.75 glucose 133 calcium 8.7 hemoglobin 10  Objective:  Vital signs in last 24 hours:  Temp:  [98.8 F (37.1 C)-99.3 F (37.4 C)] 99.1 F (37.3 C) (06/23 0421) Pulse Rate:  [94-109] 94 (06/23 0421) Resp:  [17-21] 17 (06/23 0421) BP: (126-154)/(72-87) 134/87 (06/23 0421) SpO2:  [92 %-93 %] 92 % (06/23 0421) Weight:  [54.4 kg] 54.4 kg (06/23 0447)  Weight change: 0.2 kg Filed Weights   03/20/21 0356 03/21/21 0445 03/22/21 0447  Weight: 53.5 kg 54.2 kg 54.4 kg    Intake/Output: I/O last 3 completed shifts: In: 578.2 [P.O.:480; I.V.:98.2] Out: 575 [Urine:575]   Intake/Output this shift:  No intake/output data recorded.  Gen alert, no distress No rash, cyanosis or gangrene Sclera anicteric, throat clear No jvd or bruits Chest clear bilat to bases, no rales/ wheezing RRR no MRG Abd soft ntnd no mass or ascites +bs GU deferred MS no joint effusions or deformity Ext no LE or UE edema, no wounds or ulcers Neuro is alert, Ox 3 , nf   Basic Metabolic Panel: Recent Labs  Lab 03/17/21 2315 03/18/21 0037 03/18/21 0500 03/19/21 0309 03/20/21 0007  03/21/21 0035 03/22/21 0027  NA 135   < > 136 137 140 137 136  K 4.3   < > 4.7 4.3 4.0 4.4 4.4  CL 107   < > 106 107 110 110 110  CO2 14*   < > 17* 17* 19* 19* 18*  GLUCOSE 118*   < > 106* 120* 111* 143* 133*  BUN 80*   < > 79* 77* 67* 63* 60*  CREATININE 5.04*   < > 4.76* 3.86* 2.91* 2.67* 2.75*  CALCIUM 8.6*   < > 8.6* 8.5* 8.6* 8.5* 8.7*  MG 2.0  --  2.2  --   --   --   --   PHOS  --   --  3.6 2.9 2.6  --   --    < > = values in this interval not displayed.     Liver Function Tests: Recent Labs  Lab 03/18/21 0500 03/18/21 0808 03/19/21 0309 03/20/21 0007  AST  --  28  --   --   ALT  --  37  --   --   ALKPHOS  --  77  --   --   BILITOT  --  1.3*  --   --   PROT  --  5.3*  --   --   ALBUMIN 3.0* 2.9* 2.8* 2.9*    No results for input(s): LIPASE, AMYLASE in the last 168 hours. No results for input(s): AMMONIA in the last 168 hours.  CBC: Recent Labs  Lab 03/17/21  2315 03/18/21 0500 03/19/21 0309 03/20/21 0007 03/22/21 0027  WBC 13.4* 12.3* 13.7* 15.8* 17.9*  NEUTROABS  --   --   --   --  9.7*  HGB 10.8* 10.4* 11.3* 10.7* 10.0*  HCT 33.9* 32.6* 34.6* 33.7* 31.3*  MCV 87.8 87.2 86.1 86.2 87.4  PLT 194 203 220 254 279     Cardiac Enzymes: No results for input(s): CKTOTAL, CKMB, CKMBINDEX, TROPONINI in the last 168 hours.  BNP: Invalid input(s): POCBNP  CBG: No results for input(s): GLUCAP in the last 168 hours.  Microbiology: Results for orders placed or performed during the hospital encounter of 03/17/21  Resp Panel by RT-PCR (Flu A&B, Covid) Nasopharyngeal Swab     Status: None   Collection Time: 03/17/21 11:33 PM   Specimen: Nasopharyngeal Swab; Nasopharyngeal(NP) swabs in vial transport medium  Result Value Ref Range Status   SARS Coronavirus 2 by RT PCR NEGATIVE NEGATIVE Final    Comment: (NOTE) SARS-CoV-2 target nucleic acids are NOT DETECTED.  The SARS-CoV-2 RNA is generally detectable in upper respiratory specimens during the acute phase of  infection. The lowest concentration of SARS-CoV-2 viral copies this assay can detect is 138 copies/mL. A negative result does not preclude SARS-Cov-2 infection and should not be used as the sole basis for treatment or other patient management decisions. A negative result may occur with  improper specimen collection/handling, submission of specimen other than nasopharyngeal swab, presence of viral mutation(s) within the areas targeted by this assay, and inadequate number of viral copies(<138 copies/mL). A negative result must be combined with clinical observations, patient history, and epidemiological information. The expected result is Negative.  Fact Sheet for Patients:  EntrepreneurPulse.com.au  Fact Sheet for Healthcare Providers:  IncredibleEmployment.be  This test is no t yet approved or cleared by the [place] FDA and  has been authorized for detection and/or diagnosis of SARS-CoV-2 by FDA under an Emergency Use Authorization (EUA). This EUA will remain  in effect (meaning this test can be used) for the duration of the COVID-19 declaration under Section 564(b)(1) of the Act, 21 U.S.C.section 360bbb-3(b)(1), unless the authorization is terminated  or revoked sooner.       Influenza A by PCR NEGATIVE NEGATIVE Final   Influenza B by PCR NEGATIVE NEGATIVE Final    Comment: (NOTE) The Xpert Xpress SARS-CoV-2/FLU/RSV plus assay is intended as an aid in the diagnosis of influenza from Nasopharyngeal swab specimens and should not be used as a sole basis for treatment. Nasal washings and aspirates are unacceptable for Xpert Xpress SARS-CoV-2/FLU/RSV testing.  Fact Sheet for Patients: EntrepreneurPulse.com.au  Fact Sheet for Healthcare Providers: IncredibleEmployment.be  This test is not yet approved or cleared by the [place] FDA and has been authorized for detection and/or diagnosis of SARS-CoV-2  by FDA under an Emergency Use Authorization (EUA). This EUA will remain in effect (meaning this test can be used) for the duration of the COVID-19 declaration under Section 564(b)(1) of the Act, 21 U.S.C. section 360bbb-3(b)(1), unless the authorization is terminated or revoked.  Performed at [facility], [place] [address], [place], [place] [address]   Culture, blood (routine x 2)     Status: None (Preliminary result)   Collection Time: 03/20/21 10:42 AM   Specimen: BLOOD  Result Value Ref Range Status   Specimen Description BLOOD RIGHT ANTECUBITAL  Final   Special Requests   Final    BOTTLES DRAWN AEROBIC AND ANAEROBIC Blood Culture adequate volume   Culture   Final  NO GROWTH 1 DAY Performed at [facility], [place] [address], [place], [place] [address]    Report Status PENDING  Incomplete  Culture, blood (routine x 2)     Status: None (Preliminary result)   Collection Time: 03/20/21 10:42 AM   Specimen: BLOOD RIGHT ARM  Result Value Ref Range Status   Specimen Description BLOOD RIGHT ARM  Final   Special Requests   Final    BOTTLES DRAWN AEROBIC AND ANAEROBIC Blood Culture adequate volume   Culture  Setup Time   Final    GRAM POSITIVE COCCI AEROBIC BOTTLE ONLY CRITICAL RESULT CALLED TO, READ BACK BY AND VERIFIED WITH: PHARMD J.FRENS AT 0813 ON 03/21/2021 BY T.SAAD. Performed at [facility], [place] [address] [place], [place] [address]    Culture GRAM POSITIVE COCCI  Final   Report Status PENDING  Incomplete  Blood Culture ID Panel (Reflexed)     Status: Abnormal   Collection Time: 03/20/21 10:42 AM  Result Value Ref Range Status   Enterococcus faecalis NOT DETECTED NOT DETECTED Final   Enterococcus Faecium NOT DETECTED NOT DETECTED Final   Listeria monocytogenes NOT DETECTED NOT DETECTED Final   Staphylococcus species DETECTED (A) NOT DETECTED Final    Comment: CRITICAL RESULT CALLED TO, READ BACK BY AND VERIFIED WITH: PHARMD J.FRENS AT [id number] ON 03/21/2021  BY .T.SAAD.    Staphylococcus aureus (BCID) NOT DETECTED NOT DETECTED Final   Staphylococcus epidermidis DETECTED (A) NOT DETECTED Final    Comment: CRITICAL RESULT CALLED TO, READ BACK BY AND VERIFIED WITH: PHARMD J.FRENS AT [id number] ON 03/21/2021 BY .T.SAAD.    Staphylococcus lugdunensis NOT DETECTED NOT DETECTED Final   Streptococcus species NOT DETECTED NOT DETECTED Final   Streptococcus agalactiae NOT DETECTED NOT DETECTED Final   Streptococcus pneumoniae NOT DETECTED NOT DETECTED Final   Streptococcus pyogenes NOT DETECTED NOT DETECTED Final   A.calcoaceticus-baumannii NOT DETECTED NOT DETECTED Final   Bacteroides fragilis NOT DETECTED NOT DETECTED Final   Enterobacterales NOT DETECTED NOT DETECTED Final   Enterobacter cloacae complex NOT DETECTED NOT DETECTED Final   Escherichia coli NOT DETECTED NOT DETECTED Final   Klebsiella aerogenes NOT DETECTED NOT DETECTED Final   Klebsiella oxytoca NOT DETECTED NOT DETECTED Final   Klebsiella pneumoniae NOT DETECTED NOT DETECTED Final   Proteus species NOT DETECTED NOT DETECTED Final   Salmonella species NOT DETECTED NOT DETECTED Final   Serratia marcescens NOT DETECTED NOT DETECTED Final   Haemophilus influenzae NOT DETECTED NOT DETECTED Final   Neisseria meningitidis NOT DETECTED NOT DETECTED Final   Pseudomonas aeruginosa NOT DETECTED NOT DETECTED Final   Stenotrophomonas maltophilia NOT DETECTED NOT DETECTED Final   Candida albicans NOT DETECTED NOT DETECTED Final   Candida auris NOT DETECTED NOT DETECTED Final   Candida glabrata NOT DETECTED NOT DETECTED Final   Candida krusei NOT DETECTED NOT DETECTED Final   Candida parapsilosis NOT DETECTED NOT DETECTED Final   Candida tropicalis NOT DETECTED NOT DETECTED Final   Cryptococcus neoformans/gattii NOT DETECTED NOT DETECTED Final   Methicillin resistance mecA/C NOT DETECTED NOT DETECTED Final    Comment: Performed at [facility], [address], [place], [place] [address]     Coagulation Studies: Recent Labs    03/19/21 1813  LABPROT 14.0  INR 1.1     Urinalysis: No results for input(s): COLORURINE, LABSPEC, PHURINE, GLUCOSEU, HGBUR, BILIRUBINUR, KETONESUR, PROTEINUR, UROBILINOGEN, NITRITE, LEUKOCYTESUR in the last 72 hours.  Invalid input(s): APPERANCEUR     Imaging:  NM Pulmonary Perf and Vent  Addendum Date: 03/21/2021   ADDENDUM REPORT: 03/21/2021 15:14 ADDENDUM: Please note tiny pleural base defect in the right middle lobe likely corresponds to the underlying atelectasis or scarring seen on the radiograph. Electronically Signed   By: Anner Crete M.D.   On: 03/21/2021 15:14   Result Date: 03/21/2021 CLINICAL DATA:  87 year old female with concern for pulmonary embolism. EXAM: NUCLEAR MEDICINE PERFUSION LUNG SCAN TECHNIQUE: Perfusion images were obtained in multiple projections after intravenous injection of radiopharmaceutical. Ventilation scans intentionally deferred if perfusion scan and chest x-ray adequate for interpretation during COVID 19 epidemic. RADIOPHARMACEUTICALS:  4.2 mCi Tc-51mMAA IV COMPARISON:  Chest radiograph dated 03/21/2021. FINDINGS: No with straight perfusion defect noted. IMPRESSION: Negative for pulmonary embolism. Electronically Signed: By: AAnner CreteM.D. On: 03/21/2021 14:46   DG CHEST PORT 1 VIEW  Result Date: 03/21/2021 CLINICAL DATA:  Atrial fibrillation.  Shortness of breath. EXAM: PORTABLE CHEST 1 VIEW COMPARISON:  03/19/2021.  03/17/2021. FINDINGS: Mediastinum and hilar structures normal. Cardiomegaly. Mild bilateral pulmonary interstitial prominence consistent with interstitial edema again noted. Similar findings noted on prior exam. Small bilateral pleural effusions noted. Findings suggest CHF. No pneumothorax. Degenerative changes scoliosis thoracic spine. IMPRESSION: Cardiomegaly. Mild bilateral pulmonary interstitial prominence consistent interstitial edema again noted. Similar findings noted on prior  exam. Small bilateral pleural effusions noted. Electronically Signed   By: TMarcello Moores Register   On: 03/21/2021 09:52   VAS [place]OWER EXTREMITY VENOUS (DVT)  Result Date: 03/21/2021  Lower Venous DVT Study Patient Name:  Heather Saunders Date of Exam:   03/21/2021 Medical Rec #: [id number]        Accession #:    [id number] of Birth: [birth year]        Patient Gender: F Patient Age:   83YEx[facility]  [facility]      VAS [place]OWER EXTREMITY VENOUS (DVT) Referring Phys: 1[place]--------------------------------------------------------------------------------  Indications: Edema.  Comparison Study: no prior Performing Technologist: MArchie PattenRVS  Examination Guidelines: A complete evaluation includes B-mode imaging, spectral Doppler, color Doppler, and power Doppler as needed of all accessible portions of each vessel. Bilateral testing is considered an integral part of a complete examination. Limited examinations for reoccurring indications may be performed as noted. The reflux portion of the exam is performed with the patient in reverse Trendelenburg.  +---------+---------------+---------+-----------+----------+-------------------+ RIGHT    CompressibilityPhasicitySpontaneityPropertiesThrombus Aging      +---------+---------------+---------+-----------+----------+-------------------+ CFV      Full           Yes      Yes                                      +---------+---------------+---------+-----------+----------+-------------------+ SFJ      Full                                                             +---------+---------------+---------+-----------+----------+-------------------+ FV Prox  Full                                                             +---------+---------------+---------+-----------+----------+-------------------+  FV Mid   Full                                                              +---------+---------------+---------+-----------+----------+-------------------+ FV DistalFull                                                             +---------+---------------+---------+-----------+----------+-------------------+ PFV      Full                                                             +---------+---------------+---------+-----------+----------+-------------------+ POP      Full           Yes      Yes                                      +---------+---------------+---------+-----------+----------+-------------------+ PTV      Full                                                             +---------+---------------+---------+-----------+----------+-------------------+ PERO                                                  Not well visualized +---------+---------------+---------+-----------+----------+-------------------+   +---------+---------------+---------+-----------+----------+--------------+ LEFT     CompressibilityPhasicitySpontaneityPropertiesThrombus Aging +---------+---------------+---------+-----------+----------+--------------+ CFV      Full           Yes      Yes                                 +---------+---------------+---------+-----------+----------+--------------+ SFJ      Full                                                        +---------+---------------+---------+-----------+----------+--------------+ FV Prox  Full                                                        +---------+---------------+---------+-----------+----------+--------------+ FV Mid   Full                                                        +---------+---------------+---------+-----------+----------+--------------+  FV DistalFull                                                        +---------+---------------+---------+-----------+----------+--------------+ PFV      Full                                                         +---------+---------------+---------+-----------+----------+--------------+ POP      Full           Yes      Yes                                 +---------+---------------+---------+-----------+----------+--------------+ PTV      Full                                                        +---------+---------------+---------+-----------+----------+--------------+ PERO     Full                                                        +---------+---------------+---------+-----------+----------+--------------+     Summary: BILATERAL: - No evidence of deep vein thrombosis seen in the lower extremities, bilaterally. - RIGHT: - A cystic structure is found in the popliteal fossa.  LEFT: - No cystic structure found in the popliteal fossa.  *See table(s) above for measurements and observations.    Preliminary      Medications:      apixaban  2.5 mg Oral BID   atorvastatin  40 mg Oral Daily   ezetimibe  10 mg Oral Daily   metoprolol tartrate  100 mg Oral BID   multivitamin with minerals  1 tablet Oral Daily   sodium bicarbonate  650 mg Oral BID   acetaminophen, melatonin, metoprolol tartrate, polyethylene glycol, prochlorperazine  Assessment/ Plan:  Acute kidney injury Baseline serum creatinine about 2 mg/dL.  Creatinine appears to be improving.  Urine output present.  We will continue to follow.  Urinalysis bland.   .  Continue to avoid nonsteroidal steroidal anti-inflammatory drugs as well as ACE inhibitor's and ARB's.  Lisinopril held.  Renal function appears to be improving slowly.  This may be new baseline. Metabolic acidosis continue oral sodium bicarbonate.  Increase to 3 times daily Atrial fibrillation rate control per cardiology Hypertension stable. Atrial thrombus IV heparin started per primary service   LOS: [place] '@TODAY''@9'$ :00 AM

## 2021-03-22 NOTE — Progress Notes (Addendum)
Progress Note  Patient Name: Heather Saunders Date of Encounter: 03/22/2021  [facility] Cardiologist: Dorris Carnes, MD   Subjective   Denies any chest pain or SOB.  Remains in atrial fibrillation with HR 80 to low 100's  Inpatient Medications    Scheduled Meds:  apixaban  2.5 mg Oral BID   atorvastatin  40 mg Oral Daily   ezetimibe  10 mg Oral Daily   metoprolol tartrate  100 mg Oral BID   multivitamin with minerals  1 tablet Oral Daily   sodium bicarbonate  650 mg Oral TID   Continuous Infusions:   PRN Meds: acetaminophen, melatonin, metoprolol tartrate, polyethylene glycol, prochlorperazine   Vital Signs    Vitals:   03/21/21 2218 03/22/21 0000 03/22/21 0421 03/22/21 0447  BP: (!) 141/81 126/72 134/87   Pulse: (!) 109 96 94   Resp:  (!) 21 17   Temp:  99.3 F (37.4 C) 99.1 F (37.3 C)   TempSrc:  Oral Oral   SpO2:  92% 92%   Weight:    54.4 kg  Height:        Intake/Output Summary (Last 24 hours) at 03/22/2021 1133 Last data filed at 03/22/2021 0445 Gross per 24 hour  Intake --  Output 350 ml  Net -350 ml    Last 3 Weights 03/22/2021 03/21/2021 03/20/2021  Weight (lbs) 119 lb 14.9 oz 119 lb 7.8 oz 117 lb 15.1 oz  Weight (kg) 54.4 kg 54.2 kg 53.5 kg      Telemetry    Atrial fibrillation with HR 80-low 100's  ECG    No new ECG tracing today. - Personally Reviewed  Physical Exam   GEN: Well nourished, well developed in no acute distress HEENT: Normal NECK: No JVD; No carotid bruits LYMPHATICS: No lymphadenopathy CARDIAC:irregularly irregular, no murmurs, rubs, gallops RESPIRATORY:  Clear to auscultation without rales, wheezing or rhonchi  ABDOMEN: Soft, non-tender, non-distended MUSCULOSKELETAL:  No edema; No deformity  SKIN: Warm and dry NEUROLOGIC:  Alert and oriented x 3 PSYCHIATRIC:  Normal affect   Labs    High Sensitivity Troponin:   Recent Labs  Lab 03/17/21 2333 03/18/21 0037  TROPONINIHS 53* 51*       Chemistry Recent  Labs  Lab 03/18/21 0808 03/19/21 0309 03/20/21 0007 03/21/21 0035 03/22/21 0027  NA  --  137 140 137 136  K  --  4.3 4.0 4.4 4.4  CL  --  107 110 110 110  CO2  --  17* 19* 19* 18*  GLUCOSE  --  120* 111* 143* 133*  BUN  --  77* 67* 63* 60*  CREATININE  --  3.86* 2.91* 2.67* 2.75*  CALCIUM  --  8.5* 8.6* 8.5* 8.7*  PROT 5.3*  --   --   --   --   ALBUMIN 2.9* 2.8* 2.9*  --   --   AST 28  --   --   --   --   ALT 37  --   --   --   --   ALKPHOS 77  --   --   --   --   BILITOT 1.3*  --   --   --   --   GFRNONAA  --  10* 14* 16* 16*  ANIONGAP  --  '13 11 8 8      '$ Hematology Recent Labs  Lab 03/19/21 0309 03/20/21 0007 03/22/21 0027  WBC 13.7* 15.8* 17.9*  RBC 4.02 3.91 3.58*  HGB 11.3* 10.7* 10.0*  HCT 34.6* 33.7* 31.3*  MCV 86.1 86.2 87.4  MCH 28.1 27.4 27.9  MCHC 32.7 31.8 31.9  RDW 15.6* 15.5 15.9*  PLT 220 254 279     BNP Recent Labs  Lab 03/18/21 0038  BNP 815.8*      DDimer No results for input(s): DDIMER in the last 168 hours.   Radiology    NM Pulmonary Perf and Vent  Addendum Date: 03/21/2021   ADDENDUM REPORT: 03/21/2021 15:14 ADDENDUM: Please note tiny pleural base defect in the right middle lobe likely corresponds to the underlying atelectasis or scarring seen on the radiograph. Electronically Signed   By: Anner Crete M.D.   On: 03/21/2021 15:14   Result Date: 03/21/2021 CLINICAL DATA:  87 year old female with concern for pulmonary embolism. EXAM: NUCLEAR MEDICINE PERFUSION LUNG SCAN TECHNIQUE: Perfusion images were obtained in multiple projections after intravenous injection of radiopharmaceutical. Ventilation scans intentionally deferred if perfusion scan and chest x-ray adequate for interpretation during COVID 19 epidemic. RADIOPHARMACEUTICALS:  4.2 mCi Tc-68mMAA IV COMPARISON:  Chest radiograph dated 03/21/2021. FINDINGS: No with straight perfusion defect noted. IMPRESSION: Negative for pulmonary embolism. Electronically Signed: By: AAnner CreteM.D. On: 03/21/2021 14:46   DG CHEST PORT 1 VIEW  Result Date: 03/21/2021 CLINICAL DATA:  Atrial fibrillation.  Shortness of breath. EXAM: PORTABLE CHEST 1 VIEW COMPARISON:  03/19/2021.  03/17/2021. FINDINGS: Mediastinum and hilar structures normal. Cardiomegaly. Mild bilateral pulmonary interstitial prominence consistent with interstitial edema again noted. Similar findings noted on prior exam. Small bilateral pleural effusions noted. Findings suggest CHF. No pneumothorax. Degenerative changes scoliosis thoracic spine. IMPRESSION: Cardiomegaly. Mild bilateral pulmonary interstitial prominence consistent interstitial edema again noted. Similar findings noted on prior exam. Small bilateral pleural effusions noted. Electronically Signed   By: TMarcello Moores Register   On: 03/21/2021 09:52   VAS [place]OWER EXTREMITY VENOUS (DVT)  Result Date: 03/21/2021  Lower Venous DVT Study Patient Name:  Heather Saunders Date of Exam:   03/21/2021 Medical Rec #: [id number]        Accession #:    [id number] of Birth: [id number]        Patient Gender: F Patient Age:   047YExam [facility]  [facility]Procedure:      VAS [place]OWER EXTREMITY VENOUS (DVT) Referring Phys: 1[place]--------------------------------------------------------------------------------  Indications: Edema.  Comparison Study: no prior Performing Technologist: MArchie PattenRVS  Examination Guidelines: A complete evaluation includes B-mode imaging, spectral Doppler, color Doppler, and power Doppler as needed of all accessible portions of each vessel. Bilateral testing is considered an integral part of a complete examination. Limited examinations for reoccurring indications may be performed as noted. The reflux portion of the exam is performed with the patient in reverse Trendelenburg.  +---------+---------------+---------+-----------+----------+-------------------+ RIGHT     CompressibilityPhasicitySpontaneityPropertiesThrombus Aging      +---------+---------------+---------+-----------+----------+-------------------+ CFV      Full           Yes      Yes                                      +---------+---------------+---------+-----------+----------+-------------------+ SFJ      Full                                                             +---------+---------------+---------+-----------+----------+-------------------+  FV Prox  Full                                                             +---------+---------------+---------+-----------+----------+-------------------+ FV Mid   Full                                                             +---------+---------------+---------+-----------+----------+-------------------+ FV DistalFull                                                             +---------+---------------+---------+-----------+----------+-------------------+ PFV      Full                                                             +---------+---------------+---------+-----------+----------+-------------------+ POP      Full           Yes      Yes                                      +---------+---------------+---------+-----------+----------+-------------------+ PTV      Full                                                             +---------+---------------+---------+-----------+----------+-------------------+ PERO                                                  Not well visualized +---------+---------------+---------+-----------+----------+-------------------+   +---------+---------------+---------+-----------+----------+--------------+ LEFT     CompressibilityPhasicitySpontaneityPropertiesThrombus Aging +---------+---------------+---------+-----------+----------+--------------+ CFV      Full           Yes      Yes                                  +---------+---------------+---------+-----------+----------+--------------+ SFJ      Full                                                        +---------+---------------+---------+-----------+----------+--------------+ FV Prox  Full                                                        +---------+---------------+---------+-----------+----------+--------------+  FV Mid   Full                                                        +---------+---------------+---------+-----------+----------+--------------+ FV DistalFull                                                        +---------+---------------+---------+-----------+----------+--------------+ PFV      Full                                                        +---------+---------------+---------+-----------+----------+--------------+ POP      Full           Yes      Yes                                 +---------+---------------+---------+-----------+----------+--------------+ PTV      Full                                                        +---------+---------------+---------+-----------+----------+--------------+ PERO     Full                                                        +---------+---------------+---------+-----------+----------+--------------+     Summary: BILATERAL: - No evidence of deep vein thrombosis seen in the lower extremities, bilaterally. - RIGHT: - A cystic structure is found in the popliteal fossa.  LEFT: - No cystic structure found in the popliteal fossa.  *See table(s) above for measurements and observations.    Preliminary     Cardiac Studies   TTE 03/19/2021: Impressions: 1. Left ventricular ejection fraction, by estimation, is 60 to 65%. The  left ventricle has normal function. The left ventricle has no regional  wall motion abnormalities. There is severe left ventricular hypertrophy.  Left ventricular diastolic function  could not be evaluated.   2. Right  ventricular systolic function is normal. The right ventricular  size is normal. There is severely elevated pulmonary artery systolic  pressure. The estimated right ventricular systolic pressure is [id number] mmHg.   3. Left atrial size was moderately dilated.   4. Mobile filamentous structure, which appears attached to the  interatrial septum - consider thrombus or possible myxoma (image 67/68) -  measuring ~0.6 x 2.0 cm. Right atrial size was mildly dilated.   5. The mitral valve is abnormal. Mild mitral valve regurgitation.   6. The aortic valve is tricuspid. Aortic valve regurgitation is mild.  Mild aortic valve sclerosis is present, with no evidence of aortic valve  stenosis.   7. The inferior vena cava is  dilated in size with >50% respiratory  variability, suggesting right atrial pressure of 8 mmHg.   Comparison(s): Changes from prior study are noted. 10/29/2020: LVEF 60-65%,  mobile RA mass can be seen but not noted in this study.  _______________  TEE 03/20/2021: Impressions: 1. Mobile filamentous mass measuring 1.7cm x 0.6cm in the right atrium,  appears attached to eustachian valve and extends to the interatrial  septum. Suspect likely thrombus but would also recommend checking blood  cultures to evaluate for endocarditis.  Cardioversion deferred given suspected RA thrombus   2. Left ventricular ejection fraction, by estimation, is 60 to 65%. The  left ventricle has normal function. There is severe left ventricular  hypertrophy.   3. Right ventricular systolic function is normal. The right ventricular  size is normal.   4. Left atrial size was mildly dilated. No left atrial/left atrial  appendage thrombus was detected.   5. The mitral valve is normal in structure. Mild mitral valve  regurgitation.   6. The aortic valve is tricuspid. Aortic valve regurgitation is mild.  Mild aortic valve sclerosis is present, with no evidence of aortic valve  stenosis.   7. Pulmonic valve  regurgitation is mild to moderate.    Patient Profile     87 y.o. 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  Assessment & Plan    New Onset Atrial Fibrillation with RVR - Patient presented with worsening malaise for several weeks as well as sudden onset of throat discomfort. - TTE showed LVEF of 60-65% and severe LVH, severe pulmonary hypertension with PASP 61 mmHg, moderate left atrial enlargement, mild MR and AR and possible thrombus or myxoma in the right atrium. - Initially plan was for TEE/DCCV. However, TTE and TEE should right atrial mass concerning for possible thrombus. Therefore, will focus on rate control for now. - increased Lopressor to '100mg'$  BID yesterday and HR better controlled -continue Eliquis 2.'5mg'$  BID (dosed for age>80, wt<60kg and SCr>1.5)  Right Atrium Thrombus - TTE showed mobile filamentous structure in right atrium concerning for possible thrombus or myxoma. - TEE showed mobile filamentous mass measuring 1.7cm x 0.6cm in the right atrium, appears to be attached to eustachian valve and extends to the interatrial septum.  - blood cx neg except for 1 contaminated bottle with staph epi>>no c/w endocarditis - Continue anticoagulation with Eliquis and then repeat echo in 4-6 weeks to see if thrombus resolved - VQ scan with no PE - LE venous dopplers with no DVT  Acute Diastolic CHF - Likely secondary to atrial fibrillation with RVR. - Chest x-ray showed pulmonary vascular congestion and interstitial edema.  - BNP elevated at 815. - Echo showed LVEF of 60-65%. - appears euvolemic on exam - Net positive 3.6L this admission after holding diuretics due to AKI on CKD. - SCr bumped from 2.67>2.75 today - No diuretics so far due to AKI. Will defer  diuretics to Nephrology. - Continue to daily weights, strict I/O's, and renal function.  Nonobstructive CAD - Non-obstructive disease on remote cath in 2006. - No chest pain. Throat pain sounded more like a sore throat rather than anginal equivalent. - Continue beta-blocker and statin. - No aspirin due to need for DOAC.  Demand Ischemia - High-sensitivity troponin minimally elevated at 53 >> 51. - Normal LV function on  Echo. - No chest pain or shortness of breath but she did note some throat pain on admission. Sounded more like a sore throat than anginal equivalent. - Suspect demand ischemia in setting of atrial fibrillation with RVR.  Hypertension - BP stable on exam today at 134/42mHg - continue Lopressor '100mg'$  BID  Pulmonary Hypertension - Moderate to severe pulmonary hypertension noted on Echo.  - PASP increased from 48 mmHg in 09/2020 to 62 mmHg this admission.  - V/Q scan with no PE  AKI - Creatinine 5.04 on admission. Slowly improving. Creatinine 2.75. Baseline around 1.7 to 2.0 but nephrology feels this may be her new baseline - Urinalysis bland. - Nephrology following  I have spent a total of 35 minutes with patient reviewing VQ scan, LE venous dopplers, nephrology notes , telemetry, EKGs, labs and examining patient as well as establishing an assessment and plan that was discussed with the patient.  > 50% of time was spent in direct patient care.     [facility] will sign off.   Medication Recommendations:  Eliquis 2.'5mg'$  BID, Lopressor '100mg'$  BID, diuretics per nephrology, Atorvastatin '40mg'$  daily, Zetia '10mg'$  daily.  Other recommendations (labs, testing, etc):  BMET 1 week Follow up as an outpatient:  Dr. RHarrington Challengerin 4 weeks    For questions or updates, please contact CCasas AdobesPlease consult [email or web address] for contact info under        Signed, TFransico Him MD  03/22/2021, 11:33 AM

## 2021-03-22 NOTE — Progress Notes (Addendum)
PROGRESS NOTE    Heather Saunders  [id number] DOB: 09/16/1925 DOA: 03/17/2021 PCP: Kathyrn Lass, MD    Brief Narrative:  Mrs. Matusek was admitted to the hospital with a working diagnosis of new onset atrial fibrillation with rapid ventricular response in the setting of AKI.   87 year old female past medical history for coronary artery disease, hypertension and dyslipidemia who presented with sudden onset of throat discomfort/chest pain.  Apparently not feeling well for about 4 weeks, gradually worsening.  When EMS arrived she was in atrial fibrillation with rapid ventricular response that prompted her to be brought to the hospital.  She was placed on diltiazem drip, blood pressure 128/85, heart rate 102, respiratory 12, oxygen saturation 96%.  Her lungs were clear to auscultation, heart S1-S2, present, irregularly irregular, abdomen soft, no lower extremity edema.   Sodium 135, potassium 4.3, chloride 107, bicarb 14, glucose 118, BUN 80, creatinine 5.0, anion gap 14, white count 13.4, hemoglobin 10.8, hematocrit 33.9, platelets 194. SARS COVID-19 negative.  Urinalysis specific gravity 1.018, 100 protein, 0-5 white cells.   Chest radiograph with increased insertional markings bilaterally.   EKG 133 bpm, normal axis, normal QTC, atrial fibrillation rhythm, no significant ST segment or T wave changes.   Patient was placed on diltiazem and heparin infusion for good toleration.   Patient underwent TEE founding a possible right atrial clot.    Heart rate partially controlled on metoprolol, continue to be very weak and deconditioned. Renal function is slowly improving.      Assessment & Plan:   Principal Problem:   Atrial fibrillation with RVR (HCC) Active Problems:   Essential hypertension   Mixed hyperlipidemia   AKI (acute kidney injury) ([place])   Acute kidney injury ([place])   Pulmonary HTN (HCC)   Right atrial mass   Acute diastolic CHF (congestive heart failure) (HCC)   Atrial  fibrillation with rapid ventricular response, (new onset).   Acute diastolic heart failure/ pulmonary hypertension  Her heart rate has improved, continue atrial fibrillation but with better rate control down to 80 and 90.  Echocardiogram with LV systolic function preserved EF 60 to 65%. Positive severe elevation in pulmonary artery systolic pressure 60 mmHg. Left atrial with moderate dilatation.  Positive mobile structure filamentous attached at the interatrial septum.   Further work up with Lung V/Q scan with low probability for pulmonary embolism.   Now patient in metoprolol 100 mg po bid.  Anticoagulation with apixaban.  Clinically no signs of heart failure.    2. AKI on CKD IV with anion gap metabolic acidosis. Documented urine output is 575 ml over last 24 hrs. Renal function with serum cr at 2,75 with K at 4,4 and serum bicarbonate at 18.   No significant volume overload today.  Continue oral serum bicarbonate for now. Follow on renal function in am, avoid hypotension and nephrotoxic medications.     3. HTN/ dyslipidemia. Continue blood pressure control with metoprolol. Today 134/87 mmHg. Continue to hold on amlodipine and hydralazine.    Continue with atorvastatin and ezetimibe.    4. Anemia of chronic disease. Positive leukocytosis  Persistent and worsening leukocytosis, today up to 17.9 from 15,8. She has been afebrile.    She does have rales at the right base, on lung examination. Will check chest film today, for now continue to hold on antibiotic therapy, unless patient becomes unstable or presents with fever.    Patient continue to be at high risk for worsening leukocytosis   Status is: Inpatient  Remains inpatient appropriate because:Inpatient level of care appropriate due to severity of illness  Dispo:  Patient From: Home  Planned Disposition: [place]. Possible dc to SNF on 06/24 depending on improvement in leukocytosis and no pulmonary infection    Medically stable for discharge: No     DVT prophylaxis: Apixaban   Code Status:    full  Family Communication:  I spoke over the phone with the patient's nephew about patient's  condition, plan of care, prognosis and all questions were addressed.     Consultants:  Cardiology  Nephrology   Procedures:  TEE    Subjective: Patient is feeling better, continue to have dyspnea, no chest pain, no nausea or vomiting, no palpitations.   Objective: Vitals:   03/22/21 0000 03/22/21 0421 03/22/21 0447 03/22/21 1314  BP: 126/72 134/87    Pulse: 96 94  (!) 112  Resp: (!) 21 17    Temp: 99.3 F (37.4 C) 99.1 F (37.3 C)    TempSrc: Oral Oral    SpO2: 92% 92%    Weight:   54.4 kg   Height:        Intake/Output Summary (Last 24 hours) at 03/22/2021 1603 Last data filed at 03/22/2021 1000 Gross per 24 hour  Intake 480 ml  Output 750 ml  Net -270 ml   Filed Weights   03/20/21 0356 03/21/21 0445 03/22/21 0447  Weight: 53.5 kg 54.2 kg 54.4 kg    Examination:   General: Not in pain or dyspnea, deconditioned  Neurology: Awake and alert, non focal  E ENT: mild pallor, no icterus, oral mucosa moist Cardiovascular: No JVD. S1-S2 present, rhythmic, no gallops, rubs, or murmurs. Trace bilateral lower extremity edema. Pulmonary: positive breath sounds bilaterally,  no wheezing, rhonchi. Positive left base rales. Gastrointestinal. Abdomen soft and non tender Skin. No rashes Musculoskeletal: no joint deformities     Data Reviewed: I have personally reviewed following labs and imaging studies  CBC: Recent Labs  Lab 03/17/21 2315 03/18/21 0500 03/18/21 0505 03/19/21 0309 03/20/21 0007 03/22/21 0027  WBC 13.4* 12.3*  --  13.7* 15.8* 17.9*  NEUTROABS  --   --   --   --   --  9.7*  HGB 10.8* 10.4* 10.2* 11.3* 10.7* 10.0*  HCT 33.9* 32.6* 30.0* 34.6* 33.7* 31.3*  MCV 87.8 87.2  --  86.1 86.2 87.4  PLT 194 203  --  220 254 [id number]   Basic Metabolic Panel: Recent Labs  Lab  03/17/21 2315 03/18/21 0037 03/18/21 0500 03/18/21 0505 03/19/21 0309 03/20/21 0007 03/21/21 0035 03/22/21 0027  NA 135   < > 136 136 137 140 137 136  K 4.3   < > 4.7 4.5 4.3 4.0 4.4 4.4  CL 107   < > 106  --  107 110 110 110  CO2 14*   < > 17*  --  17* 19* 19* 18*  GLUCOSE 118*   < > 106*  --  120* 111* 143* 133*  BUN 80*   < > 79*  --  77* 67* 63* 60*  CREATININE 5.04*   < > 4.76*  --  3.86* 2.91* 2.67* 2.75*  CALCIUM 8.6*   < > 8.6*  --  8.5* 8.6* 8.5* 8.7*  MG 2.0  --  2.2  --   --   --   --   --   PHOS  --   --  3.6  --  2.9 2.6  --   --    < > =  values in this interval not displayed.   GFR: Estimated Creatinine Clearance: 10.7 mL/min (A) (by C-G formula based on SCr of 2.75 mg/dL (H)). Liver Function Tests: Recent Labs  Lab 03/18/21 0500 03/18/21 0808 03/19/21 0309 03/20/21 0007  AST  --  28  --   --   ALT  --  37  --   --   ALKPHOS  --  77  --   --   BILITOT  --  1.3*  --   --   PROT  --  5.3*  --   --   ALBUMIN 3.0* 2.9* 2.8* 2.9*   No results for input(s): LIPASE, AMYLASE in the last 168 hours. No results for input(s): AMMONIA in the last 168 hours. Coagulation Profile: Recent Labs  Lab 03/19/21 1813  INR 1.1   Cardiac Enzymes: No results for input(s): CKTOTAL, CKMB, CKMBINDEX, TROPONINI in the last 168 hours. BNP (last 3 results) No results for input(s): PROBNP in the last 8760 hours. HbA1C: No results for input(s): HGBA1C in the last 72 hours. CBG: No results for input(s): GLUCAP in the last 168 hours. Lipid Profile: No results for input(s): CHOL, HDL, LDLCALC, TRIG, CHOLHDL, LDLDIRECT in the last 72 hours. Thyroid Function Tests: No results for input(s): TSH, T4TOTAL, FREET4, T3FREE, THYROIDAB in the last 72 hours. Anemia Panel: No results for input(s): VITAMINB12, FOLATE, FERRITIN, TIBC, IRON, RETICCTPCT in the last 72 hours.    Radiology Studies: I have reviewed all of the imaging during this hospital visit personally     Scheduled  Meds:  apixaban  2.5 mg Oral BID   atorvastatin  40 mg Oral Daily   ezetimibe  10 mg Oral Daily   metoprolol tartrate  100 mg Oral BID   multivitamin with minerals  1 tablet Oral Daily   sodium bicarbonate  650 mg Oral TID   Continuous Infusions:   LOS: 4 days        Coreen Shippee Gerome Apley, MD

## 2021-03-22 NOTE — TOC Progression Note (Addendum)
Transition of Care [facility]) - Progression Note    Patient Details  Name: Heather Saunders MRN: [id number] Date of Birth: 1925-08-19  Transition of Care [facility]) CM/SW Contact  Reece Agar, [place] Phone Number: 03/22/2021, 12:45 PM  Clinical Narrative:    [id number]: CSW spoke with pt about SNF choice, pt explained she would like to try [facility]. CSW contacted Janie at [facility] who will have abed available tomorrow if pt will DC tomorrow and family can dome to facility to do paperwork at 11am. Pt is vaccinated with pfizer.  1248: CSW spoke with [name]) who states he or his brother could complete paperwork at [facility] tomorrow at [place] recently had a surgery and may not be able to but his brother is his backup.   1423: CSW spoke with [name] (family friend and [name]) pt nephew to explain SNF and the process. CSW also explained pt asked for [facility] and was able to secure a bed there. [name] will complete paperwork and request to pick pt up and transport her to [place] with the help of family friend.   Expected Discharge Plan: [place] Barriers to Discharge: Continued Medical Work up, SNF Pending bed offer, Ship broker  Expected Discharge Plan and Services Expected Discharge Plan: [place] Choice: [place] arrangements for the past 2 months: [place]                                       Social Determinants of Health (SDOH) Interventions    Readmission Risk Interventions No flowsheet data found.

## 2021-03-22 NOTE — Progress Notes (Signed)
Mobility Specialist: Progress Note   03/22/21 1722  Mobility  Activity Ambulated in hall  Level of Assistance Contact guard assist, steadying assist  Assistive Device Four wheel walker  Distance Ambulated (ft) 400 ft (250' then 150')  Mobility Ambulated with assistance in hallway  Mobility Response Tolerated well  Mobility performed by Mobility specialist  $Mobility charge 1 Mobility   Pre-Mobility: 92 HR, 92% SpO2 During Mobility: 133 HR, 93% SpO2 Post-Mobility: 93 HR, 159/77 BP, 93% SpO2  Pt took one seated break d/t c/o feeling SOB, otherwise asx. Pt to BR, void successful, and then back to bed per request. Pt is set-up with her dinner and her granddaughter is present in the room.   [facility] Briona Korpela Mobility Specialist Mobility Specialist Phone: [phone]

## 2021-03-22 NOTE — Plan of Care (Signed)
  Problem: Education: Goal: Knowledge of General Education information will improve Description Including pain rating scale, medication(s)/side effects and non-pharmacologic comfort measures Outcome: Progressing   Problem: Health Behavior/Discharge Planning: Goal: Ability to manage health-related needs will improve Outcome: Progressing   

## 2021-03-23 LAB — CBC WITH DIFFERENTIAL/PLATELET
Abs Immature Granulocytes: 0.08 10*3/uL — ABNORMAL HIGH (ref 0.00–0.07)
Basophils Absolute: 0.1 10*3/uL (ref 0.0–0.1)
Basophils Relative: 0 %
Eosinophils Absolute: 0.3 10*3/uL (ref 0.0–0.5)
Eosinophils Relative: 2 %
HCT: 29.3 % — ABNORMAL LOW (ref 36.0–46.0)
Hemoglobin: 9.3 g/dL — ABNORMAL LOW (ref 12.0–15.0)
Immature Granulocytes: 1 %
Lymphocytes Relative: 40 %
Lymphs Abs: 5.7 10*3/uL — ABNORMAL HIGH (ref 0.7–4.0)
MCH: 27.8 pg (ref 26.0–34.0)
MCHC: 31.7 g/dL (ref 30.0–36.0)
MCV: 87.7 fL (ref 80.0–100.0)
Monocytes Absolute: 0.7 10*3/uL (ref 0.1–1.0)
Monocytes Relative: 5 %
Neutro Abs: 7.5 10*3/uL (ref 1.7–7.7)
Neutrophils Relative %: 52 %
Platelets: 285 10*3/uL (ref 150–400)
RBC: 3.34 MIL/uL — ABNORMAL LOW (ref 3.87–5.11)
RDW: 16.1 % — ABNORMAL HIGH (ref 11.5–15.5)
WBC: 15 10*3/uL — ABNORMAL HIGH (ref 4.0–10.5)
nRBC: 0 % (ref 0.0–0.2)

## 2021-03-23 LAB — BASIC METABOLIC PANEL
Anion gap: 11 (ref 5–15)
BUN: 54 mg/dL — ABNORMAL HIGH (ref 8–23)
CO2: 21 mmol/L — ABNORMAL LOW (ref 22–32)
Calcium: 9.2 mg/dL (ref 8.9–10.3)
Chloride: 106 mmol/L (ref 98–111)
Creatinine, Ser: 2.32 mg/dL — ABNORMAL HIGH (ref 0.44–1.00)
GFR, Estimated: 19 mL/min — ABNORMAL LOW (ref >60)
Glucose, Bld: 123 mg/dL — ABNORMAL HIGH (ref 70–99)
Potassium: 4.5 mmol/L (ref 3.5–5.1)
Sodium: 138 mmol/L (ref 135–145)

## 2021-03-23 MED ORDER — AZITHROMYCIN 500 MG PO TABS: 500.0000 mg | ORAL_TABLET | Freq: Every day | ORAL | 0 refills | Status: DC

## 2021-03-23 MED ORDER — FUROSEMIDE 20 MG PO TABS: 20.0000 mg | ORAL_TABLET | Freq: Every day | ORAL | 0 refills | Status: DC

## 2021-03-23 MED ORDER — DILTIAZEM HCL ER COATED BEADS 120 MG PO CP24: 120.0000 mg | ORAL_CAPSULE | Freq: Every day | ORAL | 6 refills | Status: DC

## 2021-03-23 MED ORDER — METOPROLOL TARTRATE 100 MG PO TABS: 100.0000 mg | ORAL_TABLET | Freq: Two times a day (BID) | ORAL | 0 refills | Status: DC

## 2021-03-23 MED ORDER — DILTIAZEM HCL ER COATED BEADS 120 MG PO CP24
120.0000 mg | ORAL_CAPSULE | Freq: Every day | ORAL | Status: DC
  Administered 2021-03-23 – 2021-03-30 (×8): 120 mg via ORAL
  Filled 2021-03-23 (×8): qty 1

## 2021-03-23 MED ORDER — CEFDINIR 300 MG PO CAPS: 600.0000 mg | ORAL_CAPSULE | Freq: Every day | ORAL | 0 refills | Status: DC

## 2021-03-23 MED ORDER — SODIUM BICARBONATE 650 MG PO TABS: 650.0000 mg | ORAL_TABLET | Freq: Three times a day (TID) | ORAL | 0 refills | Status: DC

## 2021-03-23 MED ORDER — APIXABAN 2.5 MG PO TABS: 2.5000 mg | ORAL_TABLET | Freq: Two times a day (BID) | ORAL | 0 refills | Status: DC

## 2021-03-23 NOTE — Discharge Instructions (Addendum)
1) take meds as prescribed 2) follow up with PCP 3) follow up with cardiology   Information on my medicine - ELIQUIS (apixaban)  This medication education was reviewed with me or my healthcare representative as part of my discharge preparation.  The pharmacist that spoke with me during my hospital stay was:  Einar Grad,  [facility]  Why was Eliquis prescribed for you? Eliquis was prescribed for you to reduce the risk of a blood clot forming that can cause a stroke if you have a medical condition called atrial fibrillation (a type of irregular heartbeat).  What do You need to know about Eliquis ? Take your Eliquis TWICE DAILY - one tablet in the morning and one tablet in the evening with or without food. If you have difficulty swallowing the tablet whole please discuss with your pharmacist how to take the medication safely.  Take Eliquis exactly as prescribed by your doctor and DO NOT stop taking Eliquis without talking to the doctor who prescribed the medication.  Stopping may increase your risk of developing a stroke.  Refill your prescription before you run out.  After discharge, you should have regular check-up appointments with your healthcare provider that is prescribing your Eliquis.  In the future your dose may need to be changed if your kidney function or weight changes by a significant amount or as you get older.  What do you do if you miss a dose? If you miss a dose, take it as soon as you remember on the same day and resume taking twice daily.  Do not take more than one dose of ELIQUIS at the same time to make up a missed dose.  Important Safety Information A possible side effect of Eliquis is bleeding. You should call your healthcare provider right away if you experience any of the following: Bleeding from an injury or your nose that does not stop. Unusual colored urine (red or dark brown) or unusual colored stools (red or black). Unusual bruising for unknown reasons. A  serious fall or if you hit your head (even if there is no bleeding).  Some medicines may interact with Eliquis and might increase your risk of bleeding or clotting while on Eliquis. To help avoid this, consult your healthcare provider or pharmacist prior to using any new prescription or non-prescription medications, including herbals, vitamins, non-steroidal anti-inflammatory drugs (NSAIDs) and supplements.  This website has more information on Eliquis (apixaban): http://www.eliquis.com/eliquis/home

## 2021-03-23 NOTE — Care Management Important Message (Signed)
Important Message  Patient Details  Name: Heather Saunders MRN: [id number] Date of Birth: 08/12/1925   Medicare Important Message Given:  Yes     [name] Circle 03/23/2021, 2:50 PM

## 2021-03-23 NOTE — Progress Notes (Addendum)
Progress Note  Patient Name: Heather Saunders Date of Encounter: 03/23/2021  [facility] Cardiologist: Dorris Carnes, MD   Subjective    Heart rate elevated with ambulation this morning, now coming down.  Unaware of elevated heart rate.  No chest pain or shortness of breath.  Inpatient Medications    Scheduled Meds:  apixaban  2.5 mg Oral BID   atorvastatin  40 mg Oral Daily   azithromycin  500 mg Oral q1800   diltiazem  120 mg Oral Daily   ezetimibe  10 mg Oral Daily   metoprolol tartrate  100 mg Oral BID   multivitamin with minerals  1 tablet Oral Daily   sodium bicarbonate  650 mg Oral TID   Continuous Infusions:  cefTRIAXone (ROCEPHIN)  IV 2 g (03/22/21 2023)   PRN Meds: acetaminophen, melatonin, metoprolol tartrate, polyethylene glycol, prochlorperazine   Vital Signs    Vitals:   03/23/21 0349 03/23/21 0554 03/23/21 1126 03/23/21 1131  BP: 130/89  140/74 (!) 144/74  Pulse: 92   95  Resp: 20   (!) 21  Temp: 98.9 F (37.2 C)   98.5 F (36.9 C)  TempSrc: Oral   Oral  SpO2: 93%   97%  Weight:  53.3 kg    Height:        Intake/Output Summary (Last 24 hours) at 03/23/2021 1146 Last data filed at 03/23/2021 0555 Gross per 24 hour  Intake 480 ml  Output 1775 ml  Net -1295 ml   Last 3 Weights 03/23/2021 03/22/2021 03/21/2021  Weight (lbs) 117 lb 8.1 oz 119 lb 14.9 oz 119 lb 7.8 oz  Weight (kg) 53.3 kg 54.4 kg 54.2 kg      Telemetry    Atrial fibrillation at heart rate of 80 to 130 bpm- Personally Reviewed  ECG    N/A  Physical Exam   GEN: No acute distress.   Neck: No JVD Cardiac: IR IR , no murmurs, rubs, or gallops.  Respiratory: Clear to auscultation bilaterally. GI: Soft, nontender, non-distended  MS: No edema; No deformity. Neuro:  Nonfocal  Psych: Normal affect   Labs    High Sensitivity Troponin:   Recent Labs  Lab 03/17/21 2333 03/18/21 0037  TROPONINIHS 53* 51*      Chemistry Recent Labs  Lab 03/18/21 0808 03/19/21 0309  03/20/21 0007 03/21/21 0035 03/22/21 0027 03/23/21 0101  NA  --  137 140 137 136 138  K  --  4.3 4.0 4.4 4.4 4.5  CL  --  107 110 110 110 106  CO2  --  17* 19* 19* 18* 21*  GLUCOSE  --  120* 111* 143* 133* 123*  BUN  --  77* 67* 63* 60* 54*  CREATININE  --  3.86* 2.91* 2.67* 2.75* 2.32*  CALCIUM  --  8.5* 8.6* 8.5* 8.7* 9.2  PROT 5.3*  --   --   --   --   --   ALBUMIN 2.9* 2.8* 2.9*  --   --   --   AST 28  --   --   --   --   --   ALT 37  --   --   --   --   --   ALKPHOS 77  --   --   --   --   --   BILITOT 1.3*  --   --   --   --   --   GFRNONAA  --  10* 14* 16* 16*  19*  ANIONGAP  --  '13 11 8 8 11     '$ Hematology Recent Labs  Lab 03/20/21 0007 03/22/21 0027 03/23/21 0101  WBC 15.8* 17.9* 15.0*  RBC 3.91 3.58* 3.34*  HGB 10.7* 10.0* 9.3*  HCT 33.7* 31.3* 29.3*  MCV 86.2 87.4 87.7  MCH 27.4 27.9 27.8  MCHC 31.8 31.9 31.7  RDW 15.5 15.9* 16.1*  PLT 254 279 285    BNP Recent Labs  Lab 03/18/21 0038  BNP 815.8*     DDimer No results for input(s): DDIMER in the last 168 hours.   Radiology    DG Chest 1 View  Result Date: 03/22/2021 CLINICAL DATA:  Dyspnea EXAM: CHEST  1 VIEW COMPARISON:  03/21/2021 FINDINGS: Cardiac shadow is enlarged but stable. Aortic calcifications are again seen. Changes of vascular congestion with interstitial edema are noted worsened in the interval from the prior exam. Small effusions are now seen. IMPRESSION: Increasing CHF with small effusions. Electronically Signed   By: Inez Catalina M.D.   On: 03/22/2021 19:49   NM Pulmonary Perf and Vent  Addendum Date: 03/21/2021   ADDENDUM REPORT: 03/21/2021 15:14 ADDENDUM: Please note tiny pleural base defect in the right middle lobe likely corresponds to the underlying atelectasis or scarring seen on the radiograph. Electronically Signed   By: Anner Crete M.D.   On: 03/21/2021 15:14   Result Date: 03/21/2021 CLINICAL DATA:  87 year old female with concern for pulmonary embolism. EXAM: NUCLEAR  MEDICINE PERFUSION LUNG SCAN TECHNIQUE: Perfusion images were obtained in multiple projections after intravenous injection of radiopharmaceutical. Ventilation scans intentionally deferred if perfusion scan and chest x-ray adequate for interpretation during COVID 19 epidemic. RADIOPHARMACEUTICALS:  4.2 mCi Tc-1mMAA IV COMPARISON:  Chest radiograph dated 03/21/2021. FINDINGS: No with straight perfusion defect noted. IMPRESSION: Negative for pulmonary embolism. Electronically Signed: By: AAnner CreteM.D. On: 03/21/2021 14:46   VAS [place]OWER EXTREMITY VENOUS (DVT)  Result Date: 03/22/2021  Lower Venous DVT Study Patient Name:  Heather Saunders Date of Exam:   03/21/2021 Medical Rec #: [id number]        Accession #:    [id number] of Birth: [birth year]        Patient Gender: F Patient Age:   71YEx[facility]      VAS [place]OWER EXTREMITY VENOUS (DVT) Referring Phys: [id number] TRACI R TURNER --------------------------------------------------------------------------------  Indications: Edema.  Comparison Study: no prior Performing Technologist: MArchie PattenRVS  Examination Guidelines: A complete evaluation includes B-mode imaging, spectral Doppler, color Doppler, and power Doppler as needed of all accessible portions of each vessel. Bilateral testing is considered an integral part of a complete examination. Limited examinations for reoccurring indications may be performed as noted. The reflux portion of the exam is performed with the patient in reverse Trendelenburg.  +---------+---------------+---------+-----------+----------+-------------------+ RIGHT    CompressibilityPhasicitySpontaneityPropertiesThrombus Aging      +---------+---------------+---------+-----------+----------+-------------------+ CFV      Full           Yes      Yes                                      +---------+---------------+---------+-----------+----------+-------------------+ SFJ       Full                                                             +---------+---------------+---------+-----------+----------+-------------------+  FV Prox  Full                                                             +---------+---------------+---------+-----------+----------+-------------------+ FV Mid   Full                                                             +---------+---------------+---------+-----------+----------+-------------------+ FV DistalFull                                                             +---------+---------------+---------+-----------+----------+-------------------+ PFV      Full                                                             +---------+---------------+---------+-----------+----------+-------------------+ POP      Full           Yes      Yes                                      +---------+---------------+---------+-----------+----------+-------------------+ PTV      Full                                                             +---------+---------------+---------+-----------+----------+-------------------+ PERO                                                  Not well visualized +---------+---------------+---------+-----------+----------+-------------------+   +---------+---------------+---------+-----------+----------+--------------+ LEFT     CompressibilityPhasicitySpontaneityPropertiesThrombus Aging +---------+---------------+---------+-----------+----------+--------------+ CFV      Full           Yes      Yes                                 +---------+---------------+---------+-----------+----------+--------------+ SFJ      Full                                                        +---------+---------------+---------+-----------+----------+--------------+ FV Prox  Full                                                         +---------+---------------+---------+-----------+----------+--------------+  FV Mid   Full                                                        +---------+---------------+---------+-----------+----------+--------------+ FV DistalFull                                                        +---------+---------------+---------+-----------+----------+--------------+ PFV      Full                                                        +---------+---------------+---------+-----------+----------+--------------+ POP      Full           Yes      Yes                                 +---------+---------------+---------+-----------+----------+--------------+ PTV      Full                                                        +---------+---------------+---------+-----------+----------+--------------+ PERO     Full                                                        +---------+---------------+---------+-----------+----------+--------------+     Summary: BILATERAL: - No evidence of deep vein thrombosis seen in the lower extremities, bilaterally. - RIGHT: - A cystic structure is found in the popliteal fossa.  LEFT: - No cystic structure found in the popliteal fossa.  *See table(s) above for measurements and observations. Electronically signed by Jamelle Haring on 03/22/2021 at 12:28:50 PM.    Final     Cardiac Studies   TTE 03/19/2021: Impressions: 1. Left ventricular ejection fraction, by estimation, is 60 to 65%. The  left ventricle has normal function. The left ventricle has no regional  wall motion abnormalities. There is severe left ventricular hypertrophy.  Left ventricular diastolic function  could not be evaluated.   2. Right ventricular systolic function is normal. The right ventricular  size is normal. There is severely elevated pulmonary artery systolic  pressure. The estimated right ventricular systolic pressure is [id number] mmHg.   3. Left atrial size was moderately  dilated.   4. Mobile filamentous structure, which appears attached to the  interatrial septum - consider thrombus or possible myxoma (image 67/68) -  measuring ~0.6 x 2.0 cm. Right atrial size was mildly dilated.   5. The mitral valve is abnormal. Mild mitral valve regurgitation.   6. The aortic valve is tricuspid. Aortic valve regurgitation is mild.  Mild aortic valve sclerosis is present, with no evidence of aortic valve  stenosis.   7. The inferior vena cava is dilated in size with >50% respiratory  variability, suggesting right atrial pressure of 8 mmHg.   Comparison(s): Changes from prior study are noted. 10/29/2020: LVEF 60-65%,  mobile RA mass can be seen but not noted in this study. _______________   TEE 03/20/2021: Impressions: 1. Mobile filamentous mass measuring 1.7cm x 0.6cm in the right atrium,  appears attached to eustachian valve and extends to the interatrial  septum. Suspect likely thrombus but would also recommend checking blood  cultures to evaluate for endocarditis.  Cardioversion deferred given suspected RA thrombus   2. Left ventricular ejection fraction, by estimation, is 60 to 65%. The  left ventricle has normal function. There is severe left ventricular  hypertrophy.   3. Right ventricular systolic function is normal. The right ventricular  size is normal.   4. Left atrial size was mildly dilated. No left atrial/left atrial  appendage thrombus was detected.   5. The mitral valve is normal in structure. Mild mitral valve  regurgitation.   6. The aortic valve is tricuspid. Aortic valve regurgitation is mild.  Mild aortic valve sclerosis is present, with no evidence of aortic valve  stenosis.   7. Pulmonic valve regurgitation is mild to moderate.  Patient Profile     87 y.o. 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  Assessment & Plan      New Onset Atrial Fibrillation with RVR - Patient presented with worsening malaise for several weeks as well as sudden onset of throat discomfort. - TTE showed LVEF of 60-65% and severe LVH, severe pulmonary hypertension with PASP 61 mmHg, moderate left atrial enlargement, mild MR and AR and possible thrombus or myxoma in the right atrium. - Initially plan was for TEE/DCCV. However, TTE and TEE should right atrial mass concerning for possible thrombus. Therefore, will focus on rate control for now. - HR was stable on Lopressor to '100mg'$  BID however increased to 120 -130s this morning with ambulation.  Now gradually coming down.  Suspect elevated heart rate with activity given deconditioning and age. -We will add Cardizem CD120 mg daily. -continue Eliquis 2.'5mg'$  BID (dosed for age>80, wt<60kg and SCr>1.5)   Right Atrium Thrombus - TTE showed mobile filamentous structure in right atrium concerning for possible thrombus or myxoma. - TEE showed mobile filamentous mass measuring 1.7cm x 0.6cm in the right atrium, appears to be attached to eustachian valve and extends to the interatrial septum.  - blood cx neg except for 1 contaminated bottle with staph epi>>no c/w endocarditis - Continue anticoagulation with Eliquis and then repeat echo in 4-6 weeks to see if thrombus resolved - VQ scan with no PE - LE venous dopplers with no DVT   Acute Diastolic CHF - Likely secondary to atrial fibrillation with RVR. - Chest x-ray showed pulmonary vascular congestion and interstitial edema. - BNP elevated at 815. - Echo showed LVEF of 60-65%. - appears euvolemic on exam - Net positive 3.6L this admission after holding diuretics due to AKI on CKD. - SCr bumped from 2.67>2.75 >>2.32 today>> managed by nephrology.   Nonobstructive CAD - Non-obstructive disease on remote cath in 2006. - No chest pain.  Throat pain sounded more like a sore throat rather than anginal equivalent. - Continue beta-blocker and statin. - No aspirin  due to need for DOAC.   Demand Ischemia - High-sensitivity troponin minimally elevated at 53 >> 51. - Normal LV function on Echo. - No chest pain or shortness of breath but she did note some throat pain on admission. Sounded more like a sore throat than anginal equivalent. - Suspect demand ischemia in setting of atrial fibrillation with RVR.   Hypertension -Blood pressure stable - continue Lopressor '100mg'$  BID -Add Cardizem CD 120 mg as above   Pulmonary Hypertension - Moderate to severe pulmonary hypertension noted on Echo.  - PASP increased from 48 mmHg in 09/2020 to 62 mmHg this admission. - V/Q scan with no PE   AKI - Creatinine 5.04 on admission. Slowly improving. Baseline around 1.7 to 2.0 but nephrology feels this may be her new baseline  Heart rate coming down.  Add Cardizem CD as above. Stop home hydralazine and amlodipine (Pt was not getting both while admitted).  See yesterday's note for other recommendations.  Patient is stable for discharge later today   For questions or updates, please contact Industry Please consult [email or web address] for contact info under        SignedLeanor Kail, PA  03/23/2021, 11:46 AM

## 2021-03-23 NOTE — Progress Notes (Signed)
[facility] ROUNDING NOTE   Subjective:   Interval History: 87 year old with history of coronary disease hypertension dyslipidemia was found to be in atrial fibrillation rapid ventricular rate.  She has a history of chronic disease with baseline creatinine 1.6 to 1.9 mg/dL.  She was recently started on lisinopril by primary physician for hypertension.  She is felt to be volume depleted.  Creatinine 5 mg deciliter on admit.  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  Suspect thrombus in setting of Afib, would also check blood cultures to evaluate for endocarditis.  Cardioversion deferred in setting of suspected RA thrombus.   Blood pressure 144/74 pulse 95 temperature 98.5 O2 sats 97% room air  Urine output 1.45 L 03/22/2021  Sodium 138 potassium 4.5 chloride 106 CO2 21 BUN 54 creatinine 2.32 glucose 123 calcium 9.2 hemoglobin 9.3  Objective:  Vital signs in last 24 hours:  Temp:  [97.8 F (36.6 C)-98.9 F (37.2 C)] 98.5 F (36.9 C) (06/24 1131) Pulse Rate:  [78-112] 95 (06/24 1131) Resp:  [19-21] 21 (06/24 1131) BP: (130-159)/(74-89) 144/74 (06/24 1131) SpO2:  [92 %-97 %] 97 % (06/24 1131) Weight:  [53.3 kg] 53.3 kg (06/24 0554)  Weight change: -1.1 kg Filed Weights   03/21/21 0445 03/22/21 0447 03/23/21 0554  Weight: 54.2 kg 54.4 kg 53.3 kg    Intake/Output: I/O last 3 completed shifts: In: 960 [P.O.:960] Out: 2525 [Urine:2525]   Intake/Output this shift:  No intake/output data recorded.  Gen alert, no distress No rash, cyanosis or gangrene Sclera anicteric, throat clear No jvd or bruits Chest clear bilat to bases, no rales/ wheezing RRR no MRG Abd soft ntnd no mass or ascites +bs GU deferred MS no joint effusions or deformity Ext no LE or UE edema, no wounds or ulcers Neuro is alert, Ox 3 , nf   Basic Metabolic Panel: Recent Labs  Lab 03/17/21 2315 03/18/21 0037 03/18/21 0500 03/18/21 0505 03/19/21 0309 03/20/21 0007  03/21/21 0035 03/22/21 0027 03/23/21 0101  NA 135   < > 136   < > 137 140 137 136 138  K 4.3   < > 4.7   < > 4.3 4.0 4.4 4.4 4.5  CL 107   < > 106  --  107 110 110 110 106  CO2 14*   < > 17*  --  17* 19* 19* 18* 21*  GLUCOSE 118*   < > 106*  --  120* 111* 143* 133* 123*  BUN 80*   < > 79*  --  77* 67* 63* 60* 54*  CREATININE 5.04*   < > 4.76*  --  3.86* 2.91* 2.67* 2.75* 2.32*  CALCIUM 8.6*   < > 8.6*  --  8.5* 8.6* 8.5* 8.7* 9.2  MG 2.0  --  2.2  --   --   --   --   --   --   PHOS  --   --  3.6  --  2.9 2.6  --   --   --    < > = values in this interval not displayed.     Liver Function Tests: Recent Labs  Lab 03/18/21 0500 03/18/21 0808 03/19/21 0309 03/20/21 0007  AST  --  28  --   --   ALT  --  37  --   --   ALKPHOS  --  77  --   --   BILITOT  --  1.3*  --   --  PROT  --  5.3*  --   --   ALBUMIN 3.0* 2.9* 2.8* 2.9*    No results for input(s): LIPASE, AMYLASE in the last 168 hours. No results for input(s): AMMONIA in the last 168 hours.  CBC: Recent Labs  Lab 03/18/21 0500 03/18/21 0505 03/19/21 0309 03/20/21 0007 03/22/21 0027 03/23/21 0101  WBC 12.3*  --  13.7* 15.8* 17.9* 15.0*  NEUTROABS  --   --   --   --  9.7* 7.5  HGB 10.4* 10.2* 11.3* 10.7* 10.0* 9.3*  HCT 32.6* 30.0* 34.6* 33.7* 31.3* 29.3*  MCV 87.2  --  86.1 86.2 87.4 87.7  PLT 203  --  220 254 279 285     Cardiac Enzymes: No results for input(s): CKTOTAL, CKMB, CKMBINDEX, TROPONINI in the last 168 hours.  BNP: Invalid input(s): POCBNP  CBG: No results for input(s): GLUCAP in the last 168 hours.  Microbiology: Results for orders placed or performed during the hospital encounter of 03/17/21  Resp Panel by RT-PCR (Flu A&B, Covid) Nasopharyngeal Swab     Status: None   Collection Time: 03/17/21 11:33 PM   Specimen: Nasopharyngeal Swab; Nasopharyngeal(NP) swabs in vial transport medium  Result Value Ref Range Status   SARS Coronavirus 2 by RT PCR NEGATIVE NEGATIVE Final    Comment:  (NOTE) SARS-CoV-2 target nucleic acids are NOT DETECTED.  The SARS-CoV-2 RNA is generally detectable in upper respiratory specimens during the acute phase of infection. The lowest concentration of SARS-CoV-2 viral copies this assay can detect is 138 copies/mL. A negative result does not preclude SARS-Cov-2 infection and should not be used as the sole basis for treatment or other patient management decisions. A negative result may occur with  improper specimen collection/handling, submission of specimen other than nasopharyngeal swab, presence of viral mutation(s) within the areas targeted by this assay, and inadequate number of viral copies(<138 copies/mL). A negative result must be combined with clinical observations, patient history, and epidemiological information. The expected result is Negative.  Fact Sheet for Patients:  EntrepreneurPulse.com.au  Fact Sheet for Healthcare Providers:  IncredibleEmployment.be  This test is no t yet approved or cleared by the [place] FDA and  has been authorized for detection and/or diagnosis of SARS-CoV-2 by FDA under an Emergency Use Authorization (EUA). This EUA will remain  in effect (meaning this test can be used) for the duration of the COVID-19 declaration under Section 564(b)(1) of the Act, 21 U.S.C.section 360bbb-3(b)(1), unless the authorization is terminated  or revoked sooner.       Influenza A by PCR NEGATIVE NEGATIVE Final   Influenza B by PCR NEGATIVE NEGATIVE Final    Comment: (NOTE) The Xpert Xpress SARS-CoV-2/FLU/RSV plus assay is intended as an aid in the diagnosis of influenza from Nasopharyngeal swab specimens and should not be used as a sole basis for treatment. Nasal washings and aspirates are unacceptable for Xpert Xpress SARS-CoV-2/FLU/RSV testing.  Fact Sheet for Patients: EntrepreneurPulse.com.au  Fact Sheet for Healthcare  Providers: IncredibleEmployment.be  This test is not yet approved or cleared by the [place] FDA and has been authorized for detection and/or diagnosis of SARS-CoV-2 by FDA under an Emergency Use Authorization (EUA). This EUA will remain in effect (meaning this test can be used) for the duration of the COVID-19 declaration under Section 564(b)(1) of the Act, 21 U.S.C. section 360bbb-3(b)(1), unless the authorization is terminated or revoked.  Performed at [facility], [place] [address] [place], [place] [address]   Culture, blood (  routine x 2)     Status: None (Preliminary result)   Collection Time: 03/20/21 10:42 AM   Specimen: BLOOD  Result Value Ref Range Status   Specimen Description BLOOD RIGHT ANTECUBITAL  Final   Special Requests   Final    BOTTLES DRAWN AEROBIC AND ANAEROBIC Blood Culture adequate volume   Culture   Final    NO GROWTH 2 DAYS Performed at [facility], [address] [place], [place] [address]    Report Status PENDING  Incomplete  Culture, blood (routine x 2)     Status: Abnormal   Collection Time: 03/20/21 10:42 AM   Specimen: BLOOD RIGHT ARM  Result Value Ref Range Status   Specimen Description BLOOD RIGHT ARM  Final   Special Requests   Final    BOTTLES DRAWN AEROBIC AND ANAEROBIC Blood Culture adequate volume   Culture  Setup Time   Final    GRAM POSITIVE COCCI AEROBIC BOTTLE ONLY CRITICAL RESULT CALLED TO, READ BACK BY AND VERIFIED WITH: PHARMD J.FRENS AT 0813 ON 03/21/2021 BY T.SAAD.    Culture (A)  Final    STAPHYLOCOCCUS EPIDERMIDIS THE SIGNIFICANCE OF ISOLATING THIS ORGANISM FROM A SINGLE SET OF BLOOD CULTURES WHEN MULTIPLE SETS ARE DRAWN IS UNCERTAIN. PLEASE NOTIFY THE MICROBIOLOGY DEPARTMENT WITHIN ONE WEEK IF SPECIATION AND SENSITIVITIES ARE REQUIRED. Performed at [facility], [place] [address], [place], [place] [address]    Report Status 03/23/2021 FINAL  Final  Blood Culture ID Panel (Reflexed)      Status: Abnormal   Collection Time: 03/20/21 10:42 AM  Result Value Ref Range Status   Enterococcus faecalis NOT DETECTED NOT DETECTED Final   Enterococcus Faecium NOT DETECTED NOT DETECTED Final   Listeria monocytogenes NOT DETECTED NOT DETECTED Final   Staphylococcus species DETECTED (A) NOT DETECTED Final    Comment: CRITICAL RESULT CALLED TO, READ BACK BY AND VERIFIED WITH: PHARMD J.FRENS AT [id number] ON 03/21/2021 BY .T.SAAD.    Staphylococcus aureus (BCID) NOT DETECTED NOT DETECTED Final   Staphylococcus epidermidis DETECTED (A) NOT DETECTED Final    Comment: CRITICAL RESULT CALLED TO, READ BACK BY AND VERIFIED WITH: PHARMD J.FRENS AT [id number] ON 03/21/2021 BY .T.SAAD.    Staphylococcus lugdunensis NOT DETECTED NOT DETECTED Final   Streptococcus species NOT DETECTED NOT DETECTED Final   Streptococcus agalactiae NOT DETECTED NOT DETECTED Final   Streptococcus pneumoniae NOT DETECTED NOT DETECTED Final   Streptococcus pyogenes NOT DETECTED NOT DETECTED Final   A.calcoaceticus-baumannii NOT DETECTED NOT DETECTED Final   Bacteroides fragilis NOT DETECTED NOT DETECTED Final   Enterobacterales NOT DETECTED NOT DETECTED Final   Enterobacter cloacae complex NOT DETECTED NOT DETECTED Final   Escherichia coli NOT DETECTED NOT DETECTED Final   Klebsiella aerogenes NOT DETECTED NOT DETECTED Final   Klebsiella oxytoca NOT DETECTED NOT DETECTED Final   Klebsiella pneumoniae NOT DETECTED NOT DETECTED Final   Proteus species NOT DETECTED NOT DETECTED Final   Salmonella species NOT DETECTED NOT DETECTED Final   Serratia marcescens NOT DETECTED NOT DETECTED Final   Haemophilus influenzae NOT DETECTED NOT DETECTED Final   Neisseria meningitidis NOT DETECTED NOT DETECTED Final   Pseudomonas aeruginosa NOT DETECTED NOT DETECTED Final   Stenotrophomonas maltophilia NOT DETECTED NOT DETECTED Final   Candida albicans NOT DETECTED NOT DETECTED Final   Candida auris NOT DETECTED NOT DETECTED Final   Candida  glabrata NOT DETECTED NOT DETECTED Final   Candida krusei NOT DETECTED NOT DETECTED Final   Candida parapsilosis  NOT DETECTED NOT DETECTED Final   Candida tropicalis NOT DETECTED NOT DETECTED Final   Cryptococcus neoformans/gattii NOT DETECTED NOT DETECTED Final   Methicillin resistance mecA/C NOT DETECTED NOT DETECTED Final    Comment: Performed at [facility], [place] [address], [place], [place] [address]  SARS CORONAVIRUS 2 (TAT 6-24 HRS) Nasopharyngeal Nasopharyngeal Swab     Status: None   Collection Time: 03/22/21  2:36 PM   Specimen: Nasopharyngeal Swab  Result Value Ref Range Status   SARS Coronavirus 2 NEGATIVE NEGATIVE Final    Comment: (NOTE) SARS-CoV-2 target nucleic acids are NOT DETECTED.  The SARS-CoV-2 RNA is generally detectable in upper and lower respiratory specimens during the acute phase of infection. Negative results do not preclude SARS-CoV-2 infection, do not rule out co-infections with other pathogens, and should not be used as the sole basis for treatment or other patient management decisions. Negative results must be combined with clinical observations, patient history, and epidemiological information. The expected result is Negative.  Fact Sheet for Patients: SugarRoll.be  Fact Sheet for Healthcare Providers: https://www.woods-mathews.com/  This test is not yet approved or cleared by the [place] FDA and  has been authorized for detection and/or diagnosis of SARS-CoV-2 by FDA under an Emergency Use Authorization (EUA). This EUA will remain  in effect (meaning this test can be used) for the duration of the COVID-19 declaration under Se ction 564(b)(1) of the Act, 21 U.S.C. section 360bbb-3(b)(1), unless the authorization is terminated or revoked sooner.  Performed at [facility], [place] [address], [place], [place] [address]     Coagulation Studies: No results for input(s): LABPROT, INR in the  last 72 hours.   Urinalysis: No results for input(s): COLORURINE, LABSPEC, PHURINE, GLUCOSEU, HGBUR, BILIRUBINUR, KETONESUR, PROTEINUR, UROBILINOGEN, NITRITE, LEUKOCYTESUR in the last 72 hours.  Invalid input(s): APPERANCEUR     Imaging: DG Chest 1 View  Result Date: 03/22/2021 CLINICAL DATA:  Dyspnea EXAM: CHEST  1 VIEW COMPARISON:  03/21/2021 FINDINGS: Cardiac shadow is enlarged but stable. Aortic calcifications are again seen. Changes of vascular congestion with interstitial edema are noted worsened in the interval from the prior exam. Small effusions are now seen. IMPRESSION: Increasing CHF with small effusions. Electronically Signed   By: Inez Catalina M.D.   On: 03/22/2021 19:49   NM Pulmonary Perf and Vent  Addendum Date: 03/21/2021   ADDENDUM REPORT: 03/21/2021 15:14 ADDENDUM: Please note tiny pleural base defect in the right middle lobe likely corresponds to the underlying atelectasis or scarring seen on the radiograph. Electronically Signed   By: Anner Crete M.D.   On: 03/21/2021 15:14   Result Date: 03/21/2021 CLINICAL DATA:  87 year old female with concern for pulmonary embolism. EXAM: NUCLEAR MEDICINE PERFUSION LUNG SCAN TECHNIQUE: Perfusion images were obtained in multiple projections after intravenous injection of radiopharmaceutical. Ventilation scans intentionally deferred if perfusion scan and chest x-ray adequate for interpretation during COVID 19 epidemic. RADIOPHARMACEUTICALS:  4.2 mCi Tc-59mMAA IV COMPARISON:  Chest radiograph dated 03/21/2021. FINDINGS: No with straight perfusion defect noted. IMPRESSION: Negative for pulmonary embolism. Electronically Signed: By: AAnner CreteM.D. On: 03/21/2021 14:46   VAS [place]OWER EXTREMITY VENOUS (DVT)  Result Date: 03/22/2021  Lower Venous DVT Study Patient Name:  EJAZZLYNNE AMIS Date of Exam:   03/21/2021 Medical Rec #: [id number]        Accession #:    [id number] of Birth: [birth year]        Patient Gender: F Patient  Age:   54Y Exam Location:  [facility] Procedure:      VAS [place] LOWER EXTREMITY VENOUS (DVT) Referring Phys: [id number] TRACI R TURNER --------------------------------------------------------------------------------  Indications: Edema.  Comparison Study: no prior Performing Technologist: Archie Patten RVS  Examination Guidelines: A complete evaluation includes B-mode imaging, spectral Doppler, color Doppler, and power Doppler as needed of all accessible portions of each vessel. Bilateral testing is considered an integral part of a complete examination. Limited examinations for reoccurring indications may be performed as noted. The reflux portion of the exam is performed with the patient in reverse Trendelenburg.  +---------+---------------+---------+-----------+----------+-------------------+ RIGHT    CompressibilityPhasicitySpontaneityPropertiesThrombus Aging      +---------+---------------+---------+-----------+----------+-------------------+ CFV      Full           Yes      Yes                                      +---------+---------------+---------+-----------+----------+-------------------+ SFJ      Full                                                             +---------+---------------+---------+-----------+----------+-------------------+ FV Prox  Full                                                             +---------+---------------+---------+-----------+----------+-------------------+ FV Mid   Full                                                             +---------+---------------+---------+-----------+----------+-------------------+ FV DistalFull                                                             +---------+---------------+---------+-----------+----------+-------------------+ PFV      Full                                                             +---------+---------------+---------+-----------+----------+-------------------+ POP       Full           Yes      Yes                                      +---------+---------------+---------+-----------+----------+-------------------+ PTV      Full                                                             +---------+---------------+---------+-----------+----------+-------------------+  PERO                                                  Not well visualized +---------+---------------+---------+-----------+----------+-------------------+   +---------+---------------+---------+-----------+----------+--------------+ LEFT     CompressibilityPhasicitySpontaneityPropertiesThrombus Aging +---------+---------------+---------+-----------+----------+--------------+ CFV      Full           Yes      Yes                                 +---------+---------------+---------+-----------+----------+--------------+ SFJ      Full                                                        +---------+---------------+---------+-----------+----------+--------------+ FV Prox  Full                                                        +---------+---------------+---------+-----------+----------+--------------+ FV Mid   Full                                                        +---------+---------------+---------+-----------+----------+--------------+ FV DistalFull                                                        +---------+---------------+---------+-----------+----------+--------------+ PFV      Full                                                        +---------+---------------+---------+-----------+----------+--------------+ POP      Full           Yes      Yes                                 +---------+---------------+---------+-----------+----------+--------------+ PTV      Full                                                        +---------+---------------+---------+-----------+----------+--------------+ PERO     Full                                                         +---------+---------------+---------+-----------+----------+--------------+  Summary: BILATERAL: - No evidence of deep vein thrombosis seen in the lower extremities, bilaterally. - RIGHT: - A cystic structure is found in the popliteal fossa.  LEFT: - No cystic structure found in the popliteal fossa.  *See table(s) above for measurements and observations. Electronically signed by Jamelle Haring on 03/22/2021 at 12:28:50 PM.    Final      Medications:    cefTRIAXone (ROCEPHIN)  IV 2 g (03/22/21 2023)     apixaban  2.5 mg Oral BID   atorvastatin  40 mg Oral Daily   azithromycin  500 mg Oral q1800   ezetimibe  10 mg Oral Daily   metoprolol tartrate  100 mg Oral BID   multivitamin with minerals  1 tablet Oral Daily   sodium bicarbonate  650 mg Oral TID   acetaminophen, melatonin, metoprolol tartrate, polyethylene glycol, prochlorperazine  Assessment/ Plan:  Acute kidney injury Baseline serum creatinine about 2 mg/dL.  Creatinine appears to be improving.  Urine output present.  We will continue to follow.  Urinalysis bland.   .  Continue to avoid nonsteroidal steroidal anti-inflammatory drugs as well as ACE inhibitor's and ARB's.  Lisinopril held.  Renal function appears to be improving slowly.  This may be new baseline. Metabolic acidosis continue oral sodium bicarbonate.  Increase to 3 times daily Atrial fibrillation rate control per cardiology Hypertension stable. Atrial thrombus IV heparin started per primary service Will sign off at this point.   LOS: [place] '@TODAY''@11'$ :41 AM

## 2021-03-23 NOTE — Progress Notes (Signed)
Mobility Specialist: Progress Note   03/23/21 1049  Mobility  Activity Ambulated in hall  Level of Assistance Minimal assist, patient does 75% or more  Assistive Device Four wheel walker  Distance Ambulated (ft) 260 ft  Mobility Ambulated with assistance in hallway  Mobility Response Tolerated well  Mobility performed by Mobility specialist  $Mobility charge 1 Mobility   Pre-Mobility: 94 HR, 96% SpO2 During Mobility: 137-143 (peaked at 150) HR, 93% SpO2 Post-Mobility: 122 HR, 159/96 BP, 94% SpO2  Pt c/o feeling "a little breathy" towards end of ambulation so had pt return to room and sit EOB, VSS. Pt otherwise asx during ambulation. Pt is sitting EOB with call bell at her side.   [facility] Landy Mace Mobility Specialist Mobility Specialist Phone: [phone]

## 2021-03-23 NOTE — Discharge Summary (Signed)
physician Discharge Summary  Heather Saunders [id number] DOB: 1925-03-26 DOA: 03/17/2021  PCP: Kathyrn Lass, MD  Admit date: 03/17/2021 Discharge date: 03/23/2021  Time spent: 45 minutes  Recommendations for Outpatient Follow-up:  Bmet 1 week to track metabolic acidosis and kidney function Cardiology to schedule follow up to evaluate chf and continued need for lasix Daily weight for 1 week and give data to cardiology Follow up with nephrology 1-2 weeks  Discharge Diagnoses:  Principal Problem:   Atrial fibrillation with RVR ([place]) Active Problems:   Essential hypertension   Mixed hyperlipidemia   AKI (acute kidney injury) ([place])   Acute kidney injury ([place])   Pulmonary HTN ([place])   Right atrial mass   Acute diastolic CHF (congestive heart failure) ([place])     Discharge Condition: stable  Diet recommendation: low sodium heart healthy  Filed Weights   03/21/21 0445 03/22/21 0447 03/23/21 0554  Weight: 54.2 kg 54.4 kg 53.3 kg    History of present illness:  Heather Saunders is a 87 y.o. female with medical history significant for coronary artery disease, essential hypertension, hyperlipidemia who presented to [facility] ED on 6/19 from home via EMS due to sudden onset throat discomfort and was concerned about possible anginal equivalent and called EMS.  Not feeling well for the past 1 month, gradually worsening for the past week.  Associated with uncontrolled BP for which she saw her PCP, was advised to stop taking Lisinopril, diffuse body aches, diffuse fatigue, and constipation.  Upon EMS arrival patient was in A. fib with RVR, she was brought into the ED for further evaluation.  In the ED she was started on Cardizem drip.  She was found to have AKI on CKD 3B.  Hypovolemic on exam she received judicious gentle IV fluid hydration, 500 cc normal saline due to pulmonary edema on chest x-ray.  She is asymptomatic from the A. fib RVR, denies any chest pain, palpitations, worsening dyspnea, or  dizziness.  She reported seeing some blood on toilet tissue after having a bowel movement 3 weeks ago, per the patient, FOBT at PCPs was negative, no prior history of colonoscopy.  No falls.  TRH, hospitalist team, was asked to admit.    Hospital Course:   Atrial fibrillation with rapid ventricular response, (new onset).   Acute diastolic heart failure/ pulmonary hypertension Her heart rate has improved, continue atrial fibrillation but with better rate control down to 80 and 90. Echocardiogram with LV systolic function preserved EF 60 to 65%. Positive severe elevation in pulmonary artery systolic pressure 60 mmHg. Left atrial with moderate dilatation. Positive mobile structure filamentous attached at the interatrial septum.   Further work up with Lung V/Q scan with low probability for pulmonary embolism.   Now patient in metoprolol 100 mg po bid. Anticoagulation with apixaban. Clinically no signs of heart failure.   Cardiology to arrange follow up   2. AKI on CKD IV with anion gap metabolic acidosis. Improving. Renal function with serum cr at 2,75 with K at 4,4 and serum bicarbonate at 22   No significant volume overload today. Continue oral serum bicarbonate for now. Follow on renal function in 1 week to assess for further need..     3. HTN/ dyslipidemia. Continue blood pressure control with metoprolol. Today 134/87 mmHg.   Continue with atorvastatin and ezetimibe.    4. Anemia of chronic disease. Positive leukocytosis  Persistent and worsening leukocytosis, today 15. She has been afebrile.    Procedures: TTE 03/19/2021: Impressions: 1. Left ventricular ejection  fraction, by estimation, is 60 to 65%. The  left ventricle has normal function. The left ventricle has no regional  wall motion abnormalities. There is severe left ventricular hypertrophy.  Left ventricular diastolic function  could not be evaluated.   2. Right ventricular systolic function is normal. The right  ventricular  size is normal. There is severely elevated pulmonary artery systolic  pressure. The estimated right ventricular systolic pressure is [id number] mmHg.   3. Left atrial size was moderately dilated.   4. Mobile filamentous structure, which appears attached to the  interatrial septum - consider thrombus or possible myxoma (image 67/68) -  measuring ~0.6 x 2.0 cm. Right atrial size was mildly dilated.   5. The mitral valve is abnormal. Mild mitral valve regurgitation.   6. The aortic valve is tricuspid. Aortic valve regurgitation is mild.  Mild aortic valve sclerosis is present, with no evidence of aortic valve  stenosis.   7. The inferior vena cava is dilated in size with >50% respiratory  variability, suggesting right atrial pressure of 8 mmHg.   Comparison(s): Changes from prior study are noted. 10/29/2020: LVEF 60-65%,  mobile RA mass can be seen but not noted in this study. _______________  Consultations: TEE 03/20/2021: Impressions: 1. Mobile filamentous mass measuring 1.7cm x 0.6cm in the right atrium,  appears attached to eustachian valve and extends to the interatrial  septum. Suspect likely thrombus but would also recommend checking blood  cultures to evaluate for endocarditis.  Cardioversion deferred given suspected RA thrombus   2. Left ventricular ejection fraction, by estimation, is 60 to 65%. The  left ventricle has normal function. There is severe left ventricular  hypertrophy.   3. Right ventricular systolic function is normal. The right ventricular  size is normal.   4. Left atrial size was mildly dilated. No left atrial/left atrial  appendage thrombus was detected.   5. The mitral valve is normal in structure. Mild mitral valve  regurgitation.   6. The aortic valve is tricuspid. Aortic valve regurgitation is mild.  Mild aortic valve sclerosis is present, with no evidence of aortic valve  stenosis.   7. Pulmonic valve regurgitation is mild to moderate.   Consulations : nephrology and cardiology  Discharge Exam: Vitals:   03/23/21 0002 03/23/21 0349  BP: 140/88 130/89  Pulse: 100 92  Resp: 20 20  Temp: 97.8 F (36.6 C) 98.9 F (37.2 C)  SpO2: 93% 93%    General: awake alert slightly pale no acute distress Cardiovascular: RRR Respiratory: Nornal effort BS clear  Discharge Instructions   Discharge Instructions     Amb referral to AFIB Clinic   Complete by: As directed    Amb referral to HF Clinic   Complete by: As directed    Diet - low sodium heart healthy   Complete by: As directed    Discharge instructions   Complete by: As directed    1) Repeat BMP in 5 days and if bicarb normal no more po sodium bicarb needed 2) Per Dr. Harrington Challenger they will make her follow up cardiology appointment for evaluation of CHF and continued need of PO lasix.   Increase activity slowly   Complete by: As directed       Allergies as of 03/23/2021       Reactions   Boniva [ibandronic Acid]    Sore throat   Fosamax [alendronate Sodium]    Sore throat        Medication List     STOP  taking these medications    aspirin 81 MG tablet       TAKE these medications    acetaminophen 500 MG tablet Commonly known as: TYLENOL Take 500 mg by mouth every 6 (six) hours as needed for mild pain.   amLODipine 5 MG tablet Commonly known as: NORVASC TAKE 1 TABLET BY MOUTH TWICE A DAY What changed: when to take this   apixaban 2.5 MG Tabs tablet Commonly known as: ELIQUIS Take 1 tablet (2.5 mg total) by mouth 2 (two) times daily.   atorvastatin 40 MG tablet Commonly known as: LIPITOR Take 40 mg by mouth daily.   azithromycin 500 MG tablet Commonly known as: ZITHROMAX Take 1 tablet (500 mg total) by mouth daily at 6 PM for 2 days.   cefdinir 300 MG capsule Commonly known as: OMNICEF Take 2 capsules (600 mg total) by mouth daily for 4 days.   Cholecalciferol 100 MCG (4000 UT) Caps Take 4,000 Units by mouth daily.   ezetimibe 10 MG  tablet Commonly known as: ZETIA TAKE 1 TABLET BY MOUTH EVERY DAY   furosemide 20 MG tablet Commonly known as: Lasix Take 1 tablet (20 mg total) by mouth daily.   hydrALAZINE 25 MG tablet Commonly known as: APRESOLINE Take 25 mg by mouth in the morning, at noon, and at bedtime. With food   metoprolol tartrate 100 MG tablet Commonly known as: LOPRESSOR Take 1 tablet (100 mg total) by mouth 2 (two) times daily. What changed:  medication strength how much to take additional instructions   MiraLax Mix-In Pax 17 g packet Generic drug: polyethylene glycol Take 17 g by mouth daily as needed for mild constipation.   multivitamin capsule Take 1 capsule by mouth daily.   sodium bicarbonate 650 MG tablet Take 1 tablet (650 mg total) by mouth 3 (three) times daily for 3 days.       Allergies  Allergen Reactions   Boniva [Ibandronic Acid]     Sore throat   Fosamax [Alendronate Sodium]     Sore throat    Follow-up Information     Imogene Burn, PA-C Follow up.   Specialty: Cardiology Why: Hospital follow-up with Cardiology scheduled for 05/02/2021 at 11:15am with Ermalinda Barrios, one of Dr. Alan Ripper PAs. Please arrive 15 minutes early for check-in. If this date/time does not work for you, please call our office to reschedule. Contact information: [address] [place] [address] [phone]                  The results of significant diagnostics from this hospitalization (including imaging, microbiology, ancillary and laboratory) are listed below for reference.    Significant Diagnostic Studies: DG Chest 1 View  Result Date: 03/22/2021 CLINICAL DATA:  Dyspnea EXAM: CHEST  1 VIEW COMPARISON:  03/21/2021 FINDINGS: Cardiac shadow is enlarged but stable. Aortic calcifications are again seen. Changes of vascular congestion with interstitial edema are noted worsened in the interval from the prior exam. Small effusions are now seen. IMPRESSION: Increasing  CHF with small effusions. Electronically Signed   By: Inez Catalina M.D.   On: 03/22/2021 19:49   US RENAL  Result Date: 03/18/2021 CLINICAL DATA:  87 year old female with acute renal insufficiency. EXAM: RENAL / URINARY TRACT ULTRASOUND COMPLETE COMPARISON:  CT Abdomen and Pelvis 10/10/2020. FINDINGS: Right Kidney: Renal measurements: 9.5 x 3.4 x 4.6 cm = volume: 78 mL. Renal cortical thinning and mildly echogenic renal parenchyma (images 7 and 8). No right hydronephrosis or  renal mass, lesion. Left Kidney: Renal measurements: 10.6 x 3.9 x 4.6 cm = volume: 98 mL. Similar renal cortical thinning and echogenicity to the right side. Small benign exophytic lower pole cyst measures 14 mm and appears inconsequential (image 41). No left hydronephrosis or renal mass. Bladder: Appears normal for degree of bladder distention. Other: Uterus partially visible, grossly stable and within normal limits compared to January. IMPRESSION: Some evidence of chronic medical renal disease.  Otherwise negative. Electronically Signed   By: Genevie Ann M.D.   On: 03/18/2021 05:11   NM Pulmonary Perf and Vent  Addendum Date: 03/21/2021   ADDENDUM REPORT: 03/21/2021 15:14 ADDENDUM: Please note tiny pleural base defect in the right middle lobe likely corresponds to the underlying atelectasis or scarring seen on the radiograph. Electronically Signed   By: Anner Crete M.D.   On: 03/21/2021 15:14   Result Date: 03/21/2021 CLINICAL DATA:  87 year old female with concern for pulmonary embolism. EXAM: NUCLEAR MEDICINE PERFUSION LUNG SCAN TECHNIQUE: Perfusion images were obtained in multiple projections after intravenous injection of radiopharmaceutical. Ventilation scans intentionally deferred if perfusion scan and chest x-ray adequate for interpretation during COVID 19 epidemic. RADIOPHARMACEUTICALS:  4.2 mCi Tc-46mMAA IV COMPARISON:  Chest radiograph dated 03/21/2021. FINDINGS: No with straight perfusion defect noted. IMPRESSION:  Negative for pulmonary embolism. Electronically Signed: By: AAnner CreteM.D. On: 03/21/2021 14:46   DG CHEST PORT 1 VIEW  Result Date: 03/21/2021 CLINICAL DATA:  Atrial fibrillation.  Shortness of breath. EXAM: PORTABLE CHEST 1 VIEW COMPARISON:  03/19/2021.  03/17/2021. FINDINGS: Mediastinum and hilar structures normal. Cardiomegaly. Mild bilateral pulmonary interstitial prominence consistent with interstitial edema again noted. Similar findings noted on prior exam. Small bilateral pleural effusions noted. Findings suggest CHF. No pneumothorax. Degenerative changes scoliosis thoracic spine. IMPRESSION: Cardiomegaly. Mild bilateral pulmonary interstitial prominence consistent interstitial edema again noted. Similar findings noted on prior exam. Small bilateral pleural effusions noted. Electronically Signed   By: TMarcello Moores Register   On: 03/21/2021 09:52   DG CHEST PORT 1 VIEW  Result Date: 03/19/2021 CLINICAL DATA:  Atrial fibrillation. EXAM: PORTABLE CHEST 1 VIEW COMPARISON:  03/17/2021 FINDINGS: Cardiomegaly, pulmonary vascular congestion and mild interstitial opacities are again identified. Mild bibasilar opacities likely representing atelectasis noted. No pneumothorax or large pleural effusion identified. No other significant change. IMPRESSION: Cardiomegaly with pulmonary vascular congestion and probable mild interstitial edema. Probable mild bibasilar atelectasis. Electronically Signed   By: JMargarette CanadaM.D.   On: 03/19/2021 08:45   DG Chest Port 1 View  Result Date: 03/17/2021 CLINICAL DATA:  Atrial fibrillation, sore throat EXAM: PORTABLE CHEST 1 VIEW COMPARISON:  10/06/2020 FINDINGS: Single frontal view of the chest demonstrates an enlarged cardiac silhouette. There is central vascular prominence, with mild diffuse interstitial opacities consistent with developing edema. No effusion or pneumothorax. IMPRESSION: 1. Findings consistent with mild interstitial edema. Electronically Signed   By:  MRanda NgoM.D.   On: 03/17/2021 23:34   ECHOCARDIOGRAM COMPLETE  Result Date: 03/19/2021    ECHOCARDIOGRAM REPORT   Patient Name:   Heather SCALFDate of Exam: 03/19/2021 Medical Rec #:  [id number]       Height:       64.0 in Accession #:    [id number]      Weight:       116.8 lb Date of Birth:  [birth year]       BSA:          1.557 m Patient Age:    89years  BP:           150/75 mmHg Patient Gender: F                HR:           107 bpm. Exam Location:  Inpatient Procedure: 2D Echo, Cardiac Doppler and Color Doppler Indications:    Atrial fibrillation                 Elevated troponin  History:        Patient has prior history of Echocardiogram examinations, most                 recent 10/29/2020. Previous Myocardial Infarction and CAD; Risk                 Factors:Dyslipidemia. CKD.  Sonographer:    Clayton Lefort RDCS (AE) Referring Phys: [id number] [place]  1. Left ventricular ejection fraction, by estimation, is 60 to 65%. The left ventricle has normal function. The left ventricle has no regional wall motion abnormalities. There is severe left ventricular hypertrophy. Left ventricular diastolic function could not be evaluated.  2. Right ventricular systolic function is normal. The right ventricular size is normal. There is severely elevated pulmonary artery systolic pressure. The estimated right ventricular systolic pressure is [id number] mmHg.  3. Left atrial size was moderately dilated.  4. Mobile filamentous structure, which appears attached to the interatrial septum - consider thrombus or possible myxoma (image 67/68) - measuring ~0.6 x 2.0 cm. Right atrial size was mildly dilated.  5. The mitral valve is abnormal. Mild mitral valve regurgitation.  6. The aortic valve is tricuspid. Aortic valve regurgitation is mild. Mild aortic valve sclerosis is present, with no evidence of aortic valve stenosis.  7. The inferior vena cava is dilated in size with >50% respiratory variability,  suggesting right atrial pressure of 8 mmHg. Comparison(s): Changes from prior study are noted. 10/29/2020: LVEF 60-65%, mobile RA mass can be seen but not noted in this study. FINDINGS  Left Ventricle: Left ventricular ejection fraction, by estimation, is 60 to 65%. The left ventricle has normal function. The left ventricle has no regional wall motion abnormalities. The left ventricular internal cavity size was normal in size. There is  severe left ventricular hypertrophy. Left ventricular diastolic function could not be evaluated due to atrial fibrillation. Left ventricular diastolic function could not be evaluated. Right Ventricle: The right ventricular size is normal. No increase in right ventricular wall thickness. Right ventricular systolic function is normal. There is severely elevated pulmonary artery systolic pressure. The tricuspid regurgitant velocity is 3.63 m/s, and with an assumed right atrial pressure of 8 mmHg, the estimated right ventricular systolic pressure is [id number] mmHg. Left Atrium: Left atrial size was moderately dilated. Right Atrium: Mobile filamentous structure, which appears attached to the interatrial septum - consider thrombus or possible myxoma - measuring ~0.6 x 2.0 cm. Right atrial size was mildly dilated. Pericardium: There is no evidence of pericardial effusion. Mitral Valve: The mitral valve is abnormal. There is mild thickening of the mitral valve leaflet(s). There is mild calcification of the mitral valve leaflet(s). Mild mitral valve regurgitation. Tricuspid Valve: The tricuspid valve is grossly normal. Tricuspid valve regurgitation is trivial. Aortic Valve: The aortic valve is tricuspid. Aortic valve regurgitation is mild. Aortic regurgitation PHT measures 676 msec. Mild aortic valve sclerosis is present, with no evidence of aortic valve stenosis. Aortic valve mean gradient measures 3.0 mmHg. Aortic valve peak gradient measures 4.9  mmHg. Aortic valve area, by VTI measures 4.19 cm.  Pulmonic Valve: The pulmonic valve was normal in structure. Pulmonic valve regurgitation is not visualized. Aorta: The aortic root and ascending aorta are structurally normal, with no evidence of dilitation. Venous: The inferior vena cava is dilated in size with greater than 50% respiratory variability, suggesting right atrial pressure of 8 mmHg. IAS/Shunts: No atrial level shunt detected by color flow Doppler.  LEFT VENTRICLE PLAX 2D LVIDd:         2.90 cm LVIDs:         2.40 cm LV PW:         1.60 cm LV IVS:        1.80 cm LVOT diam:     2.10 cm LV SV:         74 LV SV Index:   47 LVOT Area:     3.46 cm  RIGHT VENTRICLE             IVC RV S prime:     10.20 cm/s  IVC diam: 2.50 cm TAPSE (M-mode): 1.5 cm LEFT ATRIUM             Index       RIGHT ATRIUM           Index LA diam:        3.40 cm 2.18 cm/m  RA Area:     10.70 cm LA Vol (A2C):   43.5 ml 27.94 ml/m RA Volume:   20.60 ml  13.23 ml/m LA Vol (A4C):   76.7 ml 49.27 ml/m LA Biplane Vol: 62.9 ml 40.40 ml/m  AORTIC VALVE AV Area (Vmax):    3.63 cm AV Area (Vmean):   3.40 cm AV Area (VTI):     4.19 cm AV Vmax:           110.80 cm/s AV Vmean:          81.600 cm/s AV VTI:            0.176 m AV Peak Grad:      4.9 mmHg AV Mean Grad:      3.0 mmHg LVOT Vmax:         116.20 cm/s LVOT Vmean:        80.120 cm/s LVOT VTI:          0.213 m LVOT/AV VTI ratio: 1.21 AI PHT:            676 msec  AORTA Ao Root diam: 3.50 cm Ao Asc diam:  3.40 cm TRICUSPID VALVE TR Peak grad:   52.7 mmHg TR Vmax:        363.00 cm/s  SHUNTS Systemic VTI:  0.21 m Systemic Diam: 2.10 cm Lyman Bishop MD Electronically signed by Lyman Bishop MD Signature Date/Time: 03/19/2021/6:09:56 PM    Final    ECHO TEE  Result Date: 03/20/2021    TRANSESOPHOGEAL ECHO REPORT   Patient Name:   Heather Saunders Date of Exam: 03/20/2021 Medical Rec #:  [id number]        Height:       64.0 in Accession #:    [id number]       Weight:       117.9 lb Date of Birth:  [birth year]        BSA:          1.563 m  Patient Age:    16 years         BP:  132/57 mmHg Patient Gender: F                HR:           93 bpm. Exam Location:  Inpatient Procedure: Transesophageal Echo, Cardiac Doppler and Color Doppler Indications:     Atrial fibrillation  History:         Patient has prior history of Echocardiogram examinations, most                  recent 03/19/2021. CAD, Arrythmias:Atrial Fibrillation,                  Signs/Symptoms:CKD; Risk Factors:Hypertension.  Sonographer:     Dustin Flock Referring Phys:  [id number] Leanor Kail Diagnosing Phys: Oswaldo Milian MD PROCEDURE: The transesophogeal probe was passed without difficulty through the esophogus of the patient. Sedation performed by different physician. The patient was monitored while under deep sedation. Anesthestetic sedation was provided intravenously by Anesthesiology: 152.'48mg'$  of Propofol. The patient developed no complications during the procedure. IMPRESSIONS  1. Mobile filamentous mass measuring 1.7cm x 0.6cm in the right atrium, appears attached to eustachian valve and extends to the interatrial septum. Suspect likely thrombus but would also recommend checking blood cultures to evaluate for endocarditis. Cardioversion deferred given suspected RA thrombus  2. Left ventricular ejection fraction, by estimation, is 60 to 65%. The left ventricle has normal function. There is severe left ventricular hypertrophy.  3. Right ventricular systolic function is normal. The right ventricular size is normal.  4. Left atrial size was mildly dilated. No left atrial/left atrial appendage thrombus was detected.  5. The mitral valve is normal in structure. Mild mitral valve regurgitation.  6. The aortic valve is tricuspid. Aortic valve regurgitation is mild. Mild aortic valve sclerosis is present, with no evidence of aortic valve stenosis.  7. Pulmonic valve regurgitation is mild to moderate. FINDINGS  Left Ventricle: Left ventricular ejection fraction, by  estimation, is 60 to 65%. The left ventricle has normal function. The left ventricular internal cavity size was small. There is severe left ventricular hypertrophy. Right Ventricle: The right ventricular size is normal. No increase in right ventricular wall thickness. Right ventricular systolic function is normal. Left Atrium: Left atrial size was mildly dilated. No left atrial/left atrial appendage thrombus was detected. Right Atrium: Right atrial size was normal in size. Pericardium: There is no evidence of pericardial effusion. Mitral Valve: The mitral valve is normal in structure. Mild mitral valve regurgitation. Tricuspid Valve: The tricuspid valve is normal in structure. Tricuspid valve regurgitation is mild. Aortic Valve: The aortic valve is tricuspid. Aortic valve regurgitation is mild. Mild aortic valve sclerosis is present, with no evidence of aortic valve stenosis. Aortic valve peak gradient measures 10.0 mmHg. Pulmonic Valve: The pulmonic valve was grossly normal. Pulmonic valve regurgitation is moderate. Aorta: The aortic root and ascending aorta are structurally normal, with no evidence of dilitation. IAS/Shunts: No atrial level shunt detected by color flow Doppler.  AORTIC VALVE AV Vmax:      158.00 cm/s AV Peak Grad: 10.0 mmHg Oswaldo Milian MD Electronically signed by Oswaldo Milian MD Signature Date/Time: 03/20/2021/10:59:34 AM    Final    VAS [place] LOWER EXTREMITY VENOUS (DVT)  Result Date: 03/22/2021  Lower Venous DVT Study Patient Name:  Heather Saunders  Date of Exam:   03/21/2021 Medical Rec #: [id number]         Accession #:    [id number] Date of Birth: 1924/10/04  Patient Gender: F Patient Age:   20Y Exam Location:  [facility] Procedure:      VAS [place] LOWER EXTREMITY VENOUS (DVT) Referring Phys: [id number] TRACI R TURNER --------------------------------------------------------------------------------  Indications: Edema.  Comparison Study: no prior Performing Technologist:  Archie Patten RVS  Examination Guidelines: A complete evaluation includes B-mode imaging, spectral Doppler, color Doppler, and power Doppler as needed of all accessible portions of each vessel. Bilateral testing is considered an integral part of a complete examination. Limited examinations for reoccurring indications may be performed as noted. The reflux portion of the exam is performed with the patient in reverse Trendelenburg.  +---------+---------------+---------+-----------+----------+-------------------+ RIGHT    CompressibilityPhasicitySpontaneityPropertiesThrombus Aging      +---------+---------------+---------+-----------+----------+-------------------+ CFV      Full           Yes      Yes                                      +---------+---------------+---------+-----------+----------+-------------------+ SFJ      Full                                                             +---------+---------------+---------+-----------+----------+-------------------+ FV Prox  Full                                                             +---------+---------------+---------+-----------+----------+-------------------+ FV Mid   Full                                                             +---------+---------------+---------+-----------+----------+-------------------+ FV DistalFull                                                             +---------+---------------+---------+-----------+----------+-------------------+ PFV      Full                                                             +---------+---------------+---------+-----------+----------+-------------------+ POP      Full           Yes      Yes                                      +---------+---------------+---------+-----------+----------+-------------------+ PTV      Full                                                              +---------+---------------+---------+-----------+----------+-------------------+  PERO                                                  Not well visualized +---------+---------------+---------+-----------+----------+-------------------+   +---------+---------------+---------+-----------+----------+--------------+ LEFT     CompressibilityPhasicitySpontaneityPropertiesThrombus Aging +---------+---------------+---------+-----------+----------+--------------+ CFV      Full           Yes      Yes                                 +---------+---------------+---------+-----------+----------+--------------+ SFJ      Full                                                        +---------+---------------+---------+-----------+----------+--------------+ FV Prox  Full                                                        +---------+---------------+---------+-----------+----------+--------------+ FV Mid   Full                                                        +---------+---------------+---------+-----------+----------+--------------+ FV DistalFull                                                        +---------+---------------+---------+-----------+----------+--------------+ PFV      Full                                                        +---------+---------------+---------+-----------+----------+--------------+ POP      Full           Yes      Yes                                 +---------+---------------+---------+-----------+----------+--------------+ PTV      Full                                                        +---------+---------------+---------+-----------+----------+--------------+ PERO     Full                                                        +---------+---------------+---------+-----------+----------+--------------+  Summary: BILATERAL: - No evidence of deep vein thrombosis seen in the lower extremities, bilaterally. -  RIGHT: - A cystic structure is found in the popliteal fossa.  LEFT: - No cystic structure found in the popliteal fossa.  *See table(s) above for measurements and observations. Electronically signed by Jamelle Haring on 03/22/2021 at 12:28:50 PM.    Final     Microbiology: Recent Results (from the past 240 hour(s))  Resp Panel by RT-PCR (Flu A&B, Covid) Nasopharyngeal Swab     Status: None   Collection Time: 03/17/21 11:33 PM   Specimen: Nasopharyngeal Swab; Nasopharyngeal(NP) swabs in vial transport medium  Result Value Ref Range Status   SARS Coronavirus 2 by RT PCR NEGATIVE NEGATIVE Final    Comment: (NOTE) SARS-CoV-2 target nucleic acids are NOT DETECTED.  The SARS-CoV-2 RNA is generally detectable in upper respiratory specimens during the acute phase of infection. The lowest concentration of SARS-CoV-2 viral copies this assay can detect is 138 copies/mL. A negative result does not preclude SARS-Cov-2 infection and should not be used as the sole basis for treatment or other patient management decisions. A negative result may occur with  improper specimen collection/handling, submission of specimen other than nasopharyngeal swab, presence of viral mutation(s) within the areas targeted by this assay, and inadequate number of viral copies(<138 copies/mL). A negative result must be combined with clinical observations, patient history, and epidemiological information. The expected result is Negative.  Fact Sheet for Patients:  EntrepreneurPulse.com.au  Fact Sheet for Healthcare Providers:  IncredibleEmployment.be  This test is no t yet approved or cleared by the [place] FDA and  has been authorized for detection and/or diagnosis of SARS-CoV-2 by FDA under an Emergency Use Authorization (EUA). This EUA will remain  in effect (meaning this test can be used) for the duration of the COVID-19 declaration under Section 564(b)(1) of the Act,  21 U.S.C.section 360bbb-3(b)(1), unless the authorization is terminated  or revoked sooner.       Influenza A by PCR NEGATIVE NEGATIVE Final   Influenza B by PCR NEGATIVE NEGATIVE Final    Comment: (NOTE) The Xpert Xpress SARS-CoV-2/FLU/RSV plus assay is intended as an aid in the diagnosis of influenza from Nasopharyngeal swab specimens and should not be used as a sole basis for treatment. Nasal washings and aspirates are unacceptable for Xpert Xpress SARS-CoV-2/FLU/RSV testing.  Fact Sheet for Patients: EntrepreneurPulse.com.au  Fact Sheet for Healthcare Providers: IncredibleEmployment.be  This test is not yet approved or cleared by the [place] FDA and has been authorized for detection and/or diagnosis of SARS-CoV-2 by FDA under an Emergency Use Authorization (EUA). This EUA will remain in effect (meaning this test can be used) for the duration of the COVID-19 declaration under Section 564(b)(1) of the Act, 21 U.S.C. section 360bbb-3(b)(1), unless the authorization is terminated or revoked.  Performed at [facility], [place] [address], [place], [place] [address]   Culture, blood (routine x 2)     Status: None (Preliminary result)   Collection Time: 03/20/21 10:42 AM   Specimen: BLOOD  Result Value Ref Range Status   Specimen Description BLOOD RIGHT ANTECUBITAL  Final   Special Requests   Final    BOTTLES DRAWN AEROBIC AND ANAEROBIC Blood Culture adequate volume   Culture   Final    NO GROWTH 2 DAYS Performed at [facility], [place] [address] [place], [place] [address]    Report Status PENDING  Incomplete  Culture, blood (routine x 2)  Status: None (Preliminary result)   Collection Time: 03/20/21 10:42 AM   Specimen: BLOOD RIGHT ARM  Result Value Ref Range Status   Specimen Description BLOOD RIGHT ARM  Final   Special Requests   Final    BOTTLES DRAWN AEROBIC AND ANAEROBIC Blood Culture adequate volume   Culture   Setup Time   Final    GRAM POSITIVE COCCI AEROBIC BOTTLE ONLY CRITICAL RESULT CALLED TO, READ BACK BY AND VERIFIED WITH: PHARMD J.FRENS AT 0813 ON 03/21/2021 BY T.SAAD. Performed at [facility], [place] [address], [place], [place] [address]    Culture   Final    CULTURE REINCUBATED FOR BETTER GROWTH STAPHYLOCOCCUS EPIDERMIDIS    Report Status PENDING  Incomplete  Blood Culture ID Panel (Reflexed)     Status: Abnormal   Collection Time: 03/20/21 10:42 AM  Result Value Ref Range Status   Enterococcus faecalis NOT DETECTED NOT DETECTED Final   Enterococcus Faecium NOT DETECTED NOT DETECTED Final   Listeria monocytogenes NOT DETECTED NOT DETECTED Final   Staphylococcus species DETECTED (A) NOT DETECTED Final    Comment: CRITICAL RESULT CALLED TO, READ BACK BY AND VERIFIED WITH: PHARMD J.FRENS AT [id number] ON 03/21/2021 BY .T.SAAD.    Staphylococcus aureus (BCID) NOT DETECTED NOT DETECTED Final   Staphylococcus epidermidis DETECTED (A) NOT DETECTED Final    Comment: CRITICAL RESULT CALLED TO, READ BACK BY AND VERIFIED WITH: PHARMD J.FRENS AT [id number] ON 03/21/2021 BY .T.SAAD.    Staphylococcus lugdunensis NOT DETECTED NOT DETECTED Final   Streptococcus species NOT DETECTED NOT DETECTED Final   Streptococcus agalactiae NOT DETECTED NOT DETECTED Final   Streptococcus pneumoniae NOT DETECTED NOT DETECTED Final   Streptococcus pyogenes NOT DETECTED NOT DETECTED Final   A.calcoaceticus-baumannii NOT DETECTED NOT DETECTED Final   Bacteroides fragilis NOT DETECTED NOT DETECTED Final   Enterobacterales NOT DETECTED NOT DETECTED Final   Enterobacter cloacae complex NOT DETECTED NOT DETECTED Final   Escherichia coli NOT DETECTED NOT DETECTED Final   Klebsiella aerogenes NOT DETECTED NOT DETECTED Final   Klebsiella oxytoca NOT DETECTED NOT DETECTED Final   Klebsiella pneumoniae NOT DETECTED NOT DETECTED Final   Proteus species NOT DETECTED NOT DETECTED Final   Salmonella species NOT DETECTED NOT  DETECTED Final   Serratia marcescens NOT DETECTED NOT DETECTED Final   Haemophilus influenzae NOT DETECTED NOT DETECTED Final   Neisseria meningitidis NOT DETECTED NOT DETECTED Final   Pseudomonas aeruginosa NOT DETECTED NOT DETECTED Final   Stenotrophomonas maltophilia NOT DETECTED NOT DETECTED Final   Candida albicans NOT DETECTED NOT DETECTED Final   Candida auris NOT DETECTED NOT DETECTED Final   Candida glabrata NOT DETECTED NOT DETECTED Final   Candida krusei NOT DETECTED NOT DETECTED Final   Candida parapsilosis NOT DETECTED NOT DETECTED Final   Candida tropicalis NOT DETECTED NOT DETECTED Final   Cryptococcus neoformans/gattii NOT DETECTED NOT DETECTED Final   Methicillin resistance mecA/C NOT DETECTED NOT DETECTED Final    Comment: Performed at [facility], [address], [place], [place] [address]  SARS CORONAVIRUS 2 (TAT 6-24 HRS) Nasopharyngeal Nasopharyngeal Swab     Status: None   Collection Time: 03/22/21  2:36 PM   Specimen: Nasopharyngeal Swab  Result Value Ref Range Status   SARS Coronavirus 2 NEGATIVE NEGATIVE Final    Comment: (NOTE) SARS-CoV-2 target nucleic acids are NOT DETECTED.  The SARS-CoV-2 RNA is generally detectable in upper and lower respiratory specimens during the acute phase of infection. Negative results do not  preclude SARS-CoV-2 infection, do not rule out co-infections with other pathogens, and should not be used as the sole basis for treatment or other patient management decisions. Negative results must be combined with clinical observations, patient history, and epidemiological information. The expected result is Negative.  Fact Sheet for Patients: SugarRoll.be  Fact Sheet for Healthcare Providers: https://www.woods-mathews.com/  This test is not yet approved or cleared by the [place] FDA and  has been authorized for detection and/or diagnosis of SARS-CoV-2 by FDA under an Emergency  Use Authorization (EUA). This EUA will remain  in effect (meaning this test can be used) for the duration of the COVID-19 declaration under Se ction 564(b)(1) of the Act, 21 U.S.C. section 360bbb-3(b)(1), unless the authorization is terminated or revoked sooner.  Performed at [facility], [place] [address], [place], [place] [address]      Labs: Basic Metabolic Panel: Recent Labs  Lab 03/17/21 2315 03/18/21 0037 03/18/21 0500 03/18/21 0505 03/19/21 0309 03/20/21 0007 03/21/21 0035 03/22/21 0027 03/23/21 0101  NA 135   < > 136   < > 137 140 137 136 138  K 4.3   < > 4.7   < > 4.3 4.0 4.4 4.4 4.5  CL 107   < > 106  --  107 110 110 110 106  CO2 14*   < > 17*  --  17* 19* 19* 18* 21*  GLUCOSE 118*   < > 106*  --  120* 111* 143* 133* 123*  BUN 80*   < > 79*  --  77* 67* 63* 60* 54*  CREATININE 5.04*   < > 4.76*  --  3.86* 2.91* 2.67* 2.75* 2.32*  CALCIUM 8.6*   < > 8.6*  --  8.5* 8.6* 8.5* 8.7* 9.2  MG 2.0  --  2.2  --   --   --   --   --   --   PHOS  --   --  3.6  --  2.9 2.6  --   --   --    < > = values in this interval not displayed.   Liver Function Tests: Recent Labs  Lab 03/18/21 0500 03/18/21 0808 03/19/21 0309 03/20/21 0007  AST  --  28  --   --   ALT  --  37  --   --   ALKPHOS  --  77  --   --   BILITOT  --  1.3*  --   --   PROT  --  5.3*  --   --   ALBUMIN 3.0* 2.9* 2.8* 2.9*   No results for input(s): LIPASE, AMYLASE in the last 168 hours. No results for input(s): AMMONIA in the last 168 hours. CBC: Recent Labs  Lab 03/18/21 0500 03/18/21 0505 03/19/21 0309 03/20/21 0007 03/22/21 0027 03/23/21 0101  WBC 12.3*  --  13.7* 15.8* 17.9* 15.0*  NEUTROABS  --   --   --   --  9.7* 7.5  HGB 10.4* 10.2* 11.3* 10.7* 10.0* 9.3*  HCT 32.6* 30.0* 34.6* 33.7* 31.3* 29.3*  MCV 87.2  --  86.1 86.2 87.4 87.7  PLT 203  --  220 254 279 285   Cardiac Enzymes: No results for input(s): CKTOTAL, CKMB, CKMBINDEX, TROPONINI in the last 168 hours. BNP: BNP (last 3  results) Recent Labs    03/18/21 0038  BNP 815.8*    ProBNP (last 3 results) No results for input(s): PROBNP in the last 8760 hours.  CBG: No results for input(s): GLUCAP in the last 168 hours.     SignedRadene Gunning NP [facility] 03/23/2021, 10:55 AM

## 2021-03-23 NOTE — Plan of Care (Signed)

## 2021-03-23 NOTE — TOC Progression Note (Addendum)
Transition of Care [facility]) - Progression Note    Patient Details  Name: Dawnita Sittler MRN: [id number] Date of Birth: 12/18/1924  Transition of [facility]) CM/SW [place], [place] Phone Number: 03/23/2021, 9:39 AM  Clinical Narrative:    [id number]: [employer] case worker contacted CSW to explain that more information was needed to approve pt bc she ambulates well according to pt note. Pt may need peer to peer, [employer] case worker will follow up with CSW if one is needed.  0953: CSW contacted [name] with no answer, CSW will follow up.  1430: Janie at [facility] states family is there to fill out paperwork but [name] is not back, CSW will reach out to [place]. Everlene Balls is requesting a peer to peer by Monday June 27th by 10am. CSW sent info to MD and NP for further DC plans.  Pt nephew states if [name] is denied he would rather do [place] bc pt has many family and friend supports.   Expected Discharge Plan: [place] Barriers to Discharge: Continued Medical Work up, SNF Pending bed offer, Ship broker  Expected Discharge Plan and Services Expected Discharge Plan: [place] Choice: [place] arrangements for the past 2 months: [place]                                       Social Determinants of Health (SDOH) Interventions    Readmission Risk Interventions No flowsheet data found.

## 2021-03-24 MED ORDER — FUROSEMIDE 10 MG/ML IJ SOLN
20.0000 mg | Freq: Once | INTRAMUSCULAR | Status: AC
  Administered 2021-03-24: 20 mg via INTRAVENOUS
  Filled 2021-03-24: qty 2

## 2021-03-24 MED ORDER — SODIUM CHLORIDE 0.9 % IV SOLN
2.0000 g | INTRAVENOUS | Status: AC
  Administered 2021-03-24 – 2021-03-26 (×3): 2 g via INTRAVENOUS
  Filled 2021-03-24 (×3): qty 20

## 2021-03-24 NOTE — Progress Notes (Addendum)
PROGRESS NOTE    Heather Saunders  [id number] DOB: [birth year] DOA: 03/17/2021 PCP: Kathyrn Lass, MD   Brief Narrative: Patient was admitted for A. fib with RVR acute diastolic heart failure and AKI on CKD 4.  Subjective: No acute events reported by nursing staff overnight.  Seen and examined at bedside this morning.  Eating breakfast upon my evaluation.  Has worked with physical therapy and heart rate is improved with ambulation after starting Cardizem.  No other acute complaints.   Assessment & Plan:   Principal Problem:   Atrial fibrillation with RVR (HCC) Active Problems:   Essential hypertension   Mixed hyperlipidemia   AKI (acute kidney injury) ([place])   Acute kidney injury ([place])   Pulmonary HTN (HCC)   Right atrial mass   Acute diastolic CHF (congestive heart failure) (HCC)    Atrial fibrillation with rapid ventricular response, (new onset).   Acute diastolic heart failure/ pulmonary hypertension Initially on Cardizem drip.  Cardiology was consulted.  Transthoracic echocardiogram showed LVEF of 60 to 65% with severe pulmonary hypertension with PASP of 65mHg concern for possible thrombus or myxoma in the right atrium.  There was initially a plan for TEE/DCCV however due to concern for right atrial mass concerning for possible thrombus cardiology focused on rate control.  Now on Lopressor 100 mg twice daily with heart rates improved.  Did have some elevated heart rates with ambulation working with physical therapy and therefore Cardizem CD1 20 mg daily was started.  Remains on Eliquis 2.5 mg twice daily.  Right atrium thrombus TTE showed mobile filamentous structure in the right atrium concerning for possible thrombus or myxoma.  TEE showed mobile filamentous mass measuring 1.1 cm about 0.6 cm in the right atrium which appears to be attached to the eustachian valve and extends to the intra-atrial septum.  Cultures were negative except for 1 contaminated bottle with staph epi  not consistent with endocarditis.  Cardiology will continue with anticoagulation with apixaban and then repeat echocardiogram in 4 to 6 weeks see if thrombus has resolved.  VQ scan was negative for any pulmonary embolism.  Lower extremity venous Dopplers were negative for DVT.  Developed some pulmonary edema during this hospitalization and has been getting intermittent IV Lasix.  Will be discharged on p.o. Lasix 20 mg daily.  Appears euvolemic on exam.   2. AKI on CKD IV with metabolic acidosis. Resolving with creatinine near baseline.  Last creatinine 2.3.  Baseline appears to be around 1.6-1.9 per nephrology prior to this hospitalization.  This may be her new baseline.  Continues on oral sodium bicarb    3. HTN/ dyslipidemia. Continue blood pressure control with metoprolol. Today 134/87 mmHg.   Continue with atorvastatin and ezetimibe.    4. Anemia of chronic disease.  Hgb stable  5. CAP -completed course of Zithromax. Remains on rocephin     Procedures: TTE 03/19/2021: Impressions: 1. Left ventricular ejection fraction, by estimation, is 60 to 65%. The  left ventricle has normal function. The left ventricle has no regional  wall motion abnormalities. There is severe left ventricular hypertrophy.  Left ventricular diastolic function  could not be evaluated.   2. Right ventricular systolic function is normal. The right ventricular  size is normal. There is severely elevated pulmonary artery systolic  pressure. The estimated right ventricular systolic pressure is [id number].   3. Left atrial size was moderately dilated.   4. Mobile filamentous structure, which appears attached to the  interatrial septum - consider thrombus or  possible myxoma (image 67/68) -  measuring ~0.6 x 2.0 cm. Right atrial size was mildly dilated.   5. The mitral valve is abnormal. Mild mitral valve regurgitation.   6. The aortic valve is tricuspid. Aortic valve regurgitation is mild.  Mild aortic valve  sclerosis is present, with no evidence of aortic valve  stenosis.   7. The inferior vena cava is dilated in size with >50% respiratory  variability, suggesting right atrial pressure of 8 mmHg.   Comparison(s): Changes from prior study are noted. 10/29/2020: LVEF 60-65%,  mobile RA mass can be seen but not noted in this study. _______________   Consultations: TEE 03/20/2021: Impressions: 1. Mobile filamentous mass measuring 1.7cm x 0.6cm in the right atrium,  appears attached to eustachian valve and extends to the interatrial  septum. Suspect likely thrombus but would also recommend checking blood  cultures to evaluate for endocarditis.  Cardioversion deferred given suspected RA thrombus   2. Left ventricular ejection fraction, by estimation, is 60 to 65%. The  left ventricle has normal function. There is severe left ventricular  hypertrophy.   3. Right ventricular systolic function is normal. The right ventricular  size is normal.   4. Left atrial size was mildly dilated. No left atrial/left atrial  appendage thrombus was detected.   5. The mitral valve is normal in structure. Mild mitral valve  regurgitation.   6. The aortic valve is tricuspid. Aortic valve regurgitation is mild.  Mild aortic valve sclerosis is present, with no evidence of aortic valve  stenosis.   7. Pulmonic valve regurgitation is mild to moderate.      DVT prophylaxis: Apixaban Code Status: Full code Family Communication: Discussed with nephew over the phone all questions answered. Disposition:   Status is: Inpatient  Remains inpatient appropriate because:Unsafe d/c plan  Dispo:  Patient From: Home  Planned Disposition: [place]  Medically stable for discharge: No          Consultants:  Nephrology, cardiology     Objective: Vitals:   03/24/21 0338 03/24/21 0716 03/24/21 0927 03/24/21 1145  BP:  136/85 136/82 (!) 144/76  Pulse:  88 85 81  Resp:  19  20  Temp:    97.9  F (36.6 C)  TempSrc:      SpO2:  93%  96%  Weight: 53.6 kg     Height:        Intake/Output Summary (Last 24 hours) at 03/24/2021 1526 Last data filed at 03/24/2021 0207 Gross per 24 hour  Intake 340 ml  Output 600 ml  Net -260 ml   Filed Weights   03/22/21 0447 03/23/21 0554 03/24/21 0338  Weight: 54.4 kg 53.3 kg 53.6 kg    Examination:  General exam: Appears calm and comfortable.  Presbycusis Respiratory system: Clear to auscultation. Respiratory effort normal. Cardiovascular system: Irregular rhythm.  Regular rate. Gastrointestinal system: Abdomen is nondistended, soft and nontender. No organomegaly or masses felt. Normal bowel sounds heard. Central nervous system: Alert and oriented. No focal neurological deficits. Extremities: Symmetric 5 x 5 power. Skin: No rashes, lesions or ulcers Psychiatry: Judgement and insight appear normal. Mood & affect appropriate.     Data Reviewed: I have personally reviewed following labs and imaging studies  CBC: Recent Labs  Lab 03/18/21 0500 03/18/21 0505 03/19/21 0309 03/20/21 0007 03/22/21 0027 03/23/21 0101  WBC 12.3*  --  13.7* 15.8* 17.9* 15.0*  NEUTROABS  --   --   --   --  9.7* 7.5  HGB 10.4* 10.2* 11.3* 10.7* 10.0* 9.3*  HCT 32.6* 30.0* 34.6* 33.7* 31.3* 29.3*  MCV 87.2  --  86.1 86.2 87.4 87.7  PLT 203  --  220 254 279 [id number]    Basic Metabolic Panel: Recent Labs  Lab 03/17/21 2315 03/18/21 0037 03/18/21 0500 03/18/21 0505 03/19/21 0309 03/20/21 0007 03/21/21 0035 03/22/21 0027 03/23/21 0101  NA 135   < > 136   < > 137 140 137 136 138  K 4.3   < > 4.7   < > 4.3 4.0 4.4 4.4 4.5  CL 107   < > 106  --  107 110 110 110 106  CO2 14*   < > 17*  --  17* 19* 19* 18* 21*  GLUCOSE 118*   < > 106*  --  120* 111* 143* 133* 123*  BUN 80*   < > 79*  --  77* 67* 63* 60* 54*  CREATININE 5.04*   < > 4.76*  --  3.86* 2.91* 2.67* 2.75* 2.32*  CALCIUM 8.6*   < > 8.6*  --  8.5* 8.6* 8.5* 8.7* 9.2  MG 2.0  --  2.2  --   --    --   --   --   --   PHOS  --   --  3.6  --  2.9 2.6  --   --   --    < > = values in this interval not displayed.    GFR: Estimated Creatinine Clearance: 12.5 mL/min (A) (by C-G formula based on SCr of 2.32 mg/dL (H)).  Liver Function Tests: Recent Labs  Lab 03/18/21 0500 03/18/21 0808 03/19/21 0309 03/20/21 0007  AST  --  28  --   --   ALT  --  37  --   --   ALKPHOS  --  77  --   --   BILITOT  --  1.3*  --   --   PROT  --  5.3*  --   --   ALBUMIN 3.0* 2.9* 2.8* 2.9*    CBG: No results for input(s): GLUCAP in the last 168 hours.   Recent Results (from the past 240 hour(s))  Resp Panel by RT-PCR (Flu A&B, Covid) Nasopharyngeal Swab     Status: None   Collection Time: 03/17/21 11:33 PM   Specimen: Nasopharyngeal Swab; Nasopharyngeal(NP) swabs in vial transport medium  Result Value Ref Range Status   SARS Coronavirus 2 by RT PCR NEGATIVE NEGATIVE Final    Comment: (NOTE) SARS-CoV-2 target nucleic acids are NOT DETECTED.  The SARS-CoV-2 RNA is generally detectable in upper respiratory specimens during the acute phase of infection. The lowest concentration of SARS-CoV-2 viral copies this assay can detect is 138 copies/mL. A negative result does not preclude SARS-Cov-2 infection and should not be used as the sole basis for treatment or other patient management decisions. A negative result may occur with  improper specimen collection/handling, submission of specimen other than nasopharyngeal swab, presence of viral mutation(s) within the areas targeted by this assay, and inadequate number of viral copies(<138 copies/mL). A negative result must be combined with clinical observations, patient history, and epidemiological information. The expected result is Negative.  Fact Sheet for Patients:  EntrepreneurPulse.com.au  Fact Sheet for Healthcare Providers:  IncredibleEmployment.be  This test is no t yet approved or cleared by the [place] FDA and  has been authorized for detection and/or diagnosis of SARS-CoV-2 by FDA under an Emergency Use Authorization (EUA).  This EUA will remain  in effect (meaning this test can be used) for the duration of the COVID-19 declaration under Section 564(b)(1) of the Act, 21 U.S.C.section 360bbb-3(b)(1), unless the authorization is terminated  or revoked sooner.       Influenza A by PCR NEGATIVE NEGATIVE Final   Influenza B by PCR NEGATIVE NEGATIVE Final    Comment: (NOTE) The Xpert Xpress SARS-CoV-2/FLU/RSV plus assay is intended as an aid in the diagnosis of influenza from Nasopharyngeal swab specimens and should not be used as a sole basis for treatment. Nasal washings and aspirates are unacceptable for Xpert Xpress SARS-CoV-2/FLU/RSV testing.  Fact Sheet for Patients: EntrepreneurPulse.com.au  Fact Sheet for Healthcare Providers: IncredibleEmployment.be  This test is not yet approved or cleared by the [place] FDA and has been authorized for detection and/or diagnosis of SARS-CoV-2 by FDA under an Emergency Use Authorization (EUA). This EUA will remain in effect (meaning this test can be used) for the duration of the COVID-19 declaration under Section 564(b)(1) of the Act, 21 U.S.C. section 360bbb-3(b)(1), unless the authorization is terminated or revoked.  Performed at [facility], [place] [address], [place], [place] [address]   Culture, blood (routine x 2)     Status: None (Preliminary result)   Collection Time: 03/20/21 10:42 AM   Specimen: BLOOD  Result Value Ref Range Status   Specimen Description BLOOD RIGHT ANTECUBITAL  Final   Special Requests   Final    BOTTLES DRAWN AEROBIC AND ANAEROBIC Blood Culture adequate volume   Culture   Final    NO GROWTH 4 DAYS Performed at [facility], [place] [address], [place], [place] [address]    Report Status PENDING  Incomplete  Culture, blood (routine x 2)     Status:  Abnormal   Collection Time: 03/20/21 10:42 AM   Specimen: BLOOD RIGHT ARM  Result Value Ref Range Status   Specimen Description BLOOD RIGHT ARM  Final   Special Requests   Final    BOTTLES DRAWN AEROBIC AND ANAEROBIC Blood Culture adequate volume   Culture  Setup Time   Final    GRAM POSITIVE COCCI AEROBIC BOTTLE ONLY CRITICAL RESULT CALLED TO, READ BACK BY AND VERIFIED WITH: PHARMD J.FRENS AT 0813 ON 03/21/2021 BY T.SAAD.    Culture (A)  Final    STAPHYLOCOCCUS EPIDERMIDIS THE SIGNIFICANCE OF ISOLATING THIS ORGANISM FROM A SINGLE SET OF BLOOD CULTURES WHEN MULTIPLE SETS ARE DRAWN IS UNCERTAIN. PLEASE NOTIFY THE MICROBIOLOGY DEPARTMENT WITHIN ONE WEEK IF SPECIATION AND SENSITIVITIES ARE REQUIRED. Performed at [facility], [place] [address], [place], [place] [address]    Report Status 03/23/2021 FINAL  Final  Blood Culture ID Panel (Reflexed)     Status: Abnormal   Collection Time: 03/20/21 10:42 AM  Result Value Ref Range Status   Enterococcus faecalis NOT DETECTED NOT DETECTED Final   Enterococcus Faecium NOT DETECTED NOT DETECTED Final   Listeria monocytogenes NOT DETECTED NOT DETECTED Final   Staphylococcus species DETECTED (A) NOT DETECTED Final    Comment: CRITICAL RESULT CALLED TO, READ BACK BY AND VERIFIED WITH: PHARMD J.FRENS AT [id number] ON 03/21/2021 BY .T.SAAD.    Staphylococcus aureus (BCID) NOT DETECTED NOT DETECTED Final   Staphylococcus epidermidis DETECTED (A) NOT DETECTED Final    Comment: CRITICAL RESULT CALLED TO, READ BACK BY AND VERIFIED WITH: PHARMD J.FRENS AT [id number] ON 03/21/2021 BY .T.SAAD.    Staphylococcus lugdunensis NOT DETECTED NOT DETECTED Final   Streptococcus species NOT  DETECTED NOT DETECTED Final   Streptococcus agalactiae NOT DETECTED NOT DETECTED Final   Streptococcus pneumoniae NOT DETECTED NOT DETECTED Final   Streptococcus pyogenes NOT DETECTED NOT DETECTED Final   A.calcoaceticus-baumannii NOT DETECTED NOT DETECTED Final   Bacteroides  fragilis NOT DETECTED NOT DETECTED Final   Enterobacterales NOT DETECTED NOT DETECTED Final   Enterobacter cloacae complex NOT DETECTED NOT DETECTED Final   Escherichia coli NOT DETECTED NOT DETECTED Final   Klebsiella aerogenes NOT DETECTED NOT DETECTED Final   Klebsiella oxytoca NOT DETECTED NOT DETECTED Final   Klebsiella pneumoniae NOT DETECTED NOT DETECTED Final   Proteus species NOT DETECTED NOT DETECTED Final   Salmonella species NOT DETECTED NOT DETECTED Final   Serratia marcescens NOT DETECTED NOT DETECTED Final   Haemophilus influenzae NOT DETECTED NOT DETECTED Final   Neisseria meningitidis NOT DETECTED NOT DETECTED Final   Pseudomonas aeruginosa NOT DETECTED NOT DETECTED Final   Stenotrophomonas maltophilia NOT DETECTED NOT DETECTED Final   Candida albicans NOT DETECTED NOT DETECTED Final   Candida auris NOT DETECTED NOT DETECTED Final   Candida glabrata NOT DETECTED NOT DETECTED Final   Candida krusei NOT DETECTED NOT DETECTED Final   Candida parapsilosis NOT DETECTED NOT DETECTED Final   Candida tropicalis NOT DETECTED NOT DETECTED Final   Cryptococcus neoformans/gattii NOT DETECTED NOT DETECTED Final   Methicillin resistance mecA/C NOT DETECTED NOT DETECTED Final    Comment: Performed at [facility], [address], [place], [place] [address]  SARS CORONAVIRUS 2 (TAT 6-24 HRS) Nasopharyngeal Nasopharyngeal Swab     Status: None   Collection Time: 03/22/21  2:36 PM   Specimen: Nasopharyngeal Swab  Result Value Ref Range Status   SARS Coronavirus 2 NEGATIVE NEGATIVE Final    Comment: (NOTE) SARS-CoV-2 target nucleic acids are NOT DETECTED.  The SARS-CoV-2 RNA is generally detectable in upper and lower respiratory specimens during the acute phase of infection. Negative results do not preclude SARS-CoV-2 infection, do not rule out co-infections with other pathogens, and should not be used as the sole basis for treatment or other patient management  decisions. Negative results must be combined with clinical observations, patient history, and epidemiological information. The expected result is Negative.  Fact Sheet for Patients: SugarRoll.be  Fact Sheet for Healthcare Providers: https://www.woods-mathews.com/  This test is not yet approved or cleared by the [place] FDA and  has been authorized for detection and/or diagnosis of SARS-CoV-2 by FDA under an Emergency Use Authorization (EUA). This EUA will remain  in effect (meaning this test can be used) for the duration of the COVID-19 declaration under Se ction 564(b)(1) of the Act, 21 U.S.C. section 360bbb-3(b)(1), unless the authorization is terminated or revoked sooner.  Performed at [facility], [place] [address] [place], [place] [address]          Radiology Studies: DG Chest 1 View  Result Date: 03/22/2021 CLINICAL DATA:  Dyspnea EXAM: CHEST  1 VIEW COMPARISON:  03/21/2021 FINDINGS: Cardiac shadow is enlarged but stable. Aortic calcifications are again seen. Changes of vascular congestion with interstitial edema are noted worsened in the interval from the prior exam. Small effusions are now seen. IMPRESSION: Increasing CHF with small effusions. Electronically Signed   By: Inez Catalina M.D.   On: 03/22/2021 19:49        Scheduled Meds:  apixaban  2.5 mg Oral BID   atorvastatin  40 mg Oral Daily   azithromycin  500 mg Oral q1800   diltiazem  120 mg Oral Daily   ezetimibe  10 mg Oral Daily   metoprolol tartrate  100 mg Oral BID   multivitamin with minerals  1 tablet Oral Daily   sodium bicarbonate  650 mg Oral TID   Continuous Infusions:  cefTRIAXone (ROCEPHIN)  IV       LOS: 6 days    Time spent: 35 minutes    Leslee Home, MD [facility]   To contact the attending provider between 7A-7P or the covering provider during after hours 7P-7A, please log into the web site [email or web address] and access  using universal [place] password for that web site. If you do not have the password, please call the hospital operator.  03/24/2021, 3:26 PM

## 2021-03-24 NOTE — Progress Notes (Signed)
Progress Note  Patient Name: Heather Saunders Date of Encounter: 03/24/2021  Primary Cardiologist: Dorris Carnes, MD   Subjective   :"I thought I was supposed to get out of here yesterday."  Inpatient Medications    Scheduled Meds:  apixaban  2.5 mg Oral BID   atorvastatin  40 mg Oral Daily   azithromycin  500 mg Oral q1800   diltiazem  120 mg Oral Daily   ezetimibe  10 mg Oral Daily   furosemide  20 mg Intravenous Once   metoprolol tartrate  100 mg Oral BID   multivitamin with minerals  1 tablet Oral Daily   sodium bicarbonate  650 mg Oral TID   Continuous Infusions:  cefTRIAXone (ROCEPHIN)  IV     PRN Meds: acetaminophen, melatonin, metoprolol tartrate, polyethylene glycol, prochlorperazine   Vital Signs    Vitals:   03/24/21 0207 03/24/21 0300 03/24/21 0338 03/24/21 0716  BP: 136/78 131/79  136/85  Pulse: 96 89  88  Resp: '17 18  19  '$ Temp: 98.1 F (36.7 C)     TempSrc: Oral     SpO2: 93% 92%  93%  Weight:   53.6 kg   Height:        Intake/Output Summary (Last 24 hours) at 03/24/2021 [id number] Last data filed at 03/24/2021 0207 Gross per 24 hour  Intake 340 ml  Output 600 ml  Net -260 ml   Filed Weights   03/22/21 0447 03/23/21 0554 03/24/21 0338  Weight: 54.4 kg 53.3 kg 53.6 kg    Telemetry    Atrial fib with a CVR/RVR - Personally Reviewed  ECG    none - Personally Reviewed  Physical Exam   GEN: elderly woman, no acute distress.   Neck: 6 cm JVD Cardiac: IRIRR, no murmurs, rubs, or gallops.  Respiratory: Clear to auscultation bilaterally. GI: Soft, nontender, non-distended  MS: No edema; No deformity. Neuro:  Nonfocal  Psych: Normal affect   Labs    Chemistry Recent Labs  Lab 03/18/21 [id number] 03/19/21 0309 03/20/21 0007 03/21/21 0035 03/22/21 0027 03/23/21 0101  NA  --  137 140 137 136 138  K  --  4.3 4.0 4.4 4.4 4.5  CL  --  107 110 110 110 106  CO2  --  17* 19* 19* 18* 21*  GLUCOSE  --  120* 111* 143* 133* 123*  BUN  --  77* 67*  63* 60* 54*  CREATININE  --  3.86* 2.91* 2.67* 2.75* 2.32*  CALCIUM  --  8.5* 8.6* 8.5* 8.7* 9.2  PROT 5.3*  --   --   --   --   --   ALBUMIN 2.9* 2.8* 2.9*  --   --   --   AST 28  --   --   --   --   --   ALT 37  --   --   --   --   --   ALKPHOS 77  --   --   --   --   --   BILITOT 1.3*  --   --   --   --   --   GFRNONAA  --  10* 14* 16* 16* 19*  ANIONGAP  --  '13 11 8 8 11     '$ Hematology Recent Labs  Lab 03/20/21 0007 03/22/21 0027 03/23/21 0101  WBC 15.8* 17.9* 15.0*  RBC 3.91 3.58* 3.34*  HGB 10.7* 10.0* 9.3*  HCT 33.7* 31.3* 29.3*  MCV 86.2 87.4  87.7  MCH 27.4 27.9 27.8  MCHC 31.8 31.9 31.7  RDW 15.5 15.9* 16.1*  PLT 254 279 285    Cardiac EnzymesNo results for input(s): TROPONINI in the last 168 hours. No results for input(s): TROPIPOC in the last 168 hours.   BNP Recent Labs  Lab 03/18/21 0038  BNP 815.8*     DDimer No results for input(s): DDIMER in the last 168 hours.   Radiology    DG Chest 1 View  Result Date: 03/22/2021 CLINICAL DATA:  Dyspnea EXAM: CHEST  1 VIEW COMPARISON:  03/21/2021 FINDINGS: Cardiac shadow is enlarged but stable. Aortic calcifications are again seen. Changes of vascular congestion with interstitial edema are noted worsened in the interval from the prior exam. Small effusions are now seen. IMPRESSION: Increasing CHF with small effusions. Electronically Signed   By: Inez Catalina M.D.   On: 03/22/2021 19:49    Cardiac Studies   none  Patient Profile     87 y.o. female with atrial fib, RVR and diastolic CHF  Assessment & Plan    New onset atrial fib - her rates are still not optimal but she feels ok. I would allow her to be discharged today on metoprolol and cardizem at her current dose with early followup with Dr. Harrington Challenger in the next two weeks.  Acute diastolic heart failure - she appears to be euvolemic on exam. I would discharge on lasix 20 mg daily CAD - remote cath with non-obstructive disease. No additional evaluation is  warranted. Coags - she will continue low dose eliquis. No bleeding.     For questions or updates, please contact [place] Please consult [email or web address] for contact info under Cardiology/STEMI.      Signed, Cristopher Peru, MD  03/24/2021, 9:22 AM   Patient ID: Heather Saunders, female   DOB: 05-Sep-1925, 87 y.o.   MRN: [id number]

## 2021-03-24 NOTE — Progress Notes (Signed)
Mobility Specialist: Progress Note   03/24/21 1128  Mobility  Activity Ambulated in hall  Level of Assistance Independent  Assistive Device Four wheel walker  Distance Ambulated (ft) 400 ft  Mobility Ambulated with assistance in hallway  Mobility Response Tolerated well  Mobility performed by Mobility specialist  $Mobility charge 1 Mobility   Pre-Mobility: 84 HR, 97% SpO2 During Mobility: 139 HR Post-Mobility: 91 HR, 98% SpO2  Pt said she felt much better today. Pt's HR peaked at 139 bpm after returning to the room but mostly stayed low 100s during ambulation. Pt to BR after walk, void successful. Pt is sitting EOB talking on the phone with call bell at her side.   [facility] Kitiara Hintze Mobility Specialist Mobility Specialist Phone: [phone]

## 2021-03-24 NOTE — Plan of Care (Signed)
  Problem: Education: Goal: Knowledge of General Education information will improve Description: Including pain rating scale, medication(s)/side effects and non-pharmacologic comfort measures Outcome: Progressing   Problem: Health Behavior/Discharge Planning: Goal: Ability to manage health-related needs will improve Outcome: Progressing   Problem: Clinical Measurements: Goal: Ability to maintain clinical measurements within normal limits will improve Outcome: Progressing Goal: Will remain free from infection Outcome: Progressing Goal: Respiratory complications will improve Outcome: Progressing   Problem: Activity: Goal: Risk for activity intolerance will decrease Outcome: Progressing   Problem: Nutrition: Goal: Adequate nutrition will be maintained Outcome: Progressing   Problem: Coping: Goal: Level of anxiety will decrease Outcome: Progressing   Problem: Elimination: Goal: Will not experience complications related to bowel motility Outcome: Progressing Goal: Will not experience complications related to urinary retention Outcome: Progressing

## 2021-03-25 LAB — BASIC METABOLIC PANEL
Anion gap: 7 (ref 5–15)
BUN: 47 mg/dL — ABNORMAL HIGH (ref 8–23)
CO2: 26 mmol/L (ref 22–32)
Calcium: 9.1 mg/dL (ref 8.9–10.3)
Chloride: 105 mmol/L (ref 98–111)
Creatinine, Ser: 2.49 mg/dL — ABNORMAL HIGH (ref 0.44–1.00)
GFR, Estimated: 17 mL/min — ABNORMAL LOW (ref >60)
Glucose, Bld: 114 mg/dL — ABNORMAL HIGH (ref 70–99)
Potassium: 4 mmol/L (ref 3.5–5.1)
Sodium: 138 mmol/L (ref 135–145)

## 2021-03-25 LAB — CBC
HCT: 28.7 % — ABNORMAL LOW (ref 36.0–46.0)
Hemoglobin: 9.3 g/dL — ABNORMAL LOW (ref 12.0–15.0)
MCH: 28.3 pg (ref 26.0–34.0)
MCHC: 32.4 g/dL (ref 30.0–36.0)
MCV: 87.2 fL (ref 80.0–100.0)
Platelets: 282 10*3/uL (ref 150–400)
RBC: 3.29 MIL/uL — ABNORMAL LOW (ref 3.87–5.11)
RDW: 16 % — ABNORMAL HIGH (ref 11.5–15.5)
WBC: 12.9 10*3/uL — ABNORMAL HIGH (ref 4.0–10.5)
nRBC: 0 % (ref 0.0–0.2)

## 2021-03-25 LAB — CULTURE, BLOOD (ROUTINE X 2)
Culture: NO GROWTH
Special Requests: ADEQUATE

## 2021-03-25 MED ORDER — CEFDINIR 300 MG PO CAPS: 300.0000 mg | ORAL_CAPSULE | Freq: Two times a day (BID) | ORAL | 0 refills | Status: DC

## 2021-03-25 NOTE — Progress Notes (Signed)
Progress Note  Patient Name: Heather Saunders Date of Encounter: 03/25/2021  Primary Cardiologist: Dorris Carnes, MD   Subjective   "I feel ok."  Inpatient Medications    Scheduled Meds:  apixaban  2.5 mg Oral BID   atorvastatin  40 mg Oral Daily   diltiazem  120 mg Oral Daily   ezetimibe  10 mg Oral Daily   metoprolol tartrate  100 mg Oral BID   multivitamin with minerals  1 tablet Oral Daily   sodium bicarbonate  650 mg Oral TID   Continuous Infusions:  cefTRIAXone (ROCEPHIN)  IV 200 mL/hr at 03/25/21 0401   PRN Meds: acetaminophen, melatonin, metoprolol tartrate, polyethylene glycol, prochlorperazine   Vital Signs    Vitals:   03/24/21 2112 03/24/21 2257 03/25/21 0401 03/25/21 0754  BP: (!) 160/88 (!) 145/85 131/84   Pulse: 81 78 90   Resp:  15 20   Temp:  98.2 F (36.8 C) 97.8 F (36.6 C) 98.4 F (36.9 C)  TempSrc:  Oral Oral Oral  SpO2:  93% 100%   Weight:      Height:        Intake/Output Summary (Last 24 hours) at 03/25/2021 0900 Last data filed at 03/25/2021 0401 Gross per 24 hour  Intake 100 ml  Output 650 ml  Net -550 ml   Filed Weights   03/22/21 0447 03/23/21 0554 03/24/21 0338  Weight: 54.4 kg 53.3 kg 53.6 kg    Telemetry    Atrial fib with a controlled VR - Personally Reviewed  ECG    none - Personally Reviewed  Physical Exam   GEN: No acute distress.   Neck: No JVD Cardiac: IRIRR, no murmurs, rubs, or gallops.  Respiratory: Clear to auscultation bilaterally. GI: Soft, nontender, non-distended  MS: No edema; No deformity. Neuro:  Nonfocal  Psych: Normal affect   Labs    Chemistry Recent Labs  Lab 03/19/21 0309 03/20/21 0007 03/21/21 0035 03/22/21 0027 03/23/21 0101 03/25/21 0047  NA 137 140   < > 136 138 138  K 4.3 4.0   < > 4.4 4.5 4.0  CL 107 110   < > 110 106 105  CO2 17* 19*   < > 18* 21* 26  GLUCOSE 120* 111*   < > 133* 123* 114*  BUN 77* 67*   < > 60* 54* 47*  CREATININE 3.86* 2.91*   < > 2.75* 2.32* 2.49*   CALCIUM 8.5* 8.6*   < > 8.7* 9.2 9.1  ALBUMIN 2.8* 2.9*  --   --   --   --   GFRNONAA 10* 14*   < > 16* 19* 17*  ANIONGAP 13 11   < > '8 11 7   '$ < > = values in this interval not displayed.     Hematology Recent Labs  Lab 03/22/21 0027 03/23/21 0101 03/25/21 0047  WBC 17.9* 15.0* 12.9*  RBC 3.58* 3.34* 3.29*  HGB 10.0* 9.3* 9.3*  HCT 31.3* 29.3* 28.7*  MCV 87.4 87.7 87.2  MCH 27.9 27.8 28.3  MCHC 31.9 31.7 32.4  RDW 15.9* 16.1* 16.0*  PLT 279 285 282    Cardiac EnzymesNo results for input(s): TROPONINI in the last 168 hours. No results for input(s): TROPIPOC in the last 168 hours.   BNPNo results for input(s): BNP, PROBNP in the last 168 hours.   DDimer No results for input(s): DDIMER in the last 168 hours.   Radiology    No results found.  Cardiac Studies  Patient Profile     87 y.o. female admitted with atrial fib and rvr and chest pain  Assessment & Plan    Atrial fib - her rates are well controlled. No additional rec's. Continue her current meds Acute diastolic heart failure - euvolemic. Continue lasix 20 mg daily CAD - no additional anginal symptoms. No additional workup Coags - she will continue low dose eliquis  [facility] will sign off.   Medication Recommendations:  see above Other recommendations (labs, testing, etc):  none Follow up as an outpatient:  with me in 3-4 weeks  For questions or updates, please contact [facility] Please consult [email or web address] for contact info under Cardiology/STEMI.      Signed, Cristopher Peru, MD  03/25/2021, 9:00 AM   Patient ID: Heather Saunders, female   DOB: 07/01/1925, 87 y.o.   MRN: [id number]

## 2021-03-25 NOTE — TOC Progression Note (Signed)
Transition of Care [facility]) - Progression Note    Patient Details  Name: Heather Saunders MRN: [id number] Date of Birth: November 24, 1924  Transition of Care [facility]) CM/SW [place], LCSW Phone Number:[phone] 03/25/2021, 1:08 PM  Clinical Narrative:     CSW followed up with [facility] to obtain the details of the peer to peer. CSW was provided the following information. Phone number [phone] option [phone]. CSW was informed that peer to peer would need to be completed by 9:00am tomorrow.  TOC team will continue to assist with discharge planning needs.   Expected Discharge Plan: [place] Barriers to Discharge: Continued Medical Work up, SNF Pending bed offer, Ship broker  Expected Discharge Plan and Services Expected Discharge Plan: [place] In-house Referral: Clinical Social Work Discharge Planning Services: CM Consult Post Acute Care Choice: [place] Living arrangements for the past 2 months: [place] Expected Discharge Date: 03/25/21                         HH Arranged: RN, PT, OT, Nurse's Aide           Social Determinants of Health (SDOH) Interventions    Readmission Risk Interventions No flowsheet data found.

## 2021-03-25 NOTE — Progress Notes (Signed)
Mobility Specialist: Progress Note   03/25/21 1122  Mobility  Activity Ambulated in hall  Level of Assistance Modified independent, requires aide device or extra time  Assistive Device Four wheel walker  Distance Ambulated (ft) 400 ft  Mobility Ambulated with assistance in hallway  Mobility Response Tolerated well  Mobility performed by Mobility specialist  $Mobility charge 1 Mobility   Pre-Mobility: 87 HR, 132/84 BP, 96% SpO2 During Mobility: 123 HR, 100% SpO2 Post-Mobility: 86 HR, 152/91 BP, 99% SpO2  Pt stopped for one seated break lasting 1 minute d/t feeling SOB which she attributed to wearing a mask, otherwise asx. Pt to BR and then back to bed after walk with call bell and phone at her side.  [facility] Beverly Ferner Mobility Specialist Mobility Specialist Phone: [phone]

## 2021-03-25 NOTE — Progress Notes (Signed)
PROGRESS NOTE    Heather Saunders  [id number] DOB: 1925-06-27 DOA: 03/17/2021 PCP: Kathyrn Lass, MD   Brief Narrative: Patient was admitted for A. fib with RVR acute diastolic heart failure and AKI on CKD 4.  Subjective: No acute events reported by nursing staff overnight.  Seen and examined at bedside this morning.  Heart rates stable.   Assessment & Plan:   Principal Problem:   Atrial fibrillation with RVR (HCC) Active Problems:   Essential hypertension   Mixed hyperlipidemia   AKI (acute kidney injury) ([place])   Acute kidney injury ([place])   Pulmonary HTN (HCC)   Right atrial mass   Acute diastolic CHF (congestive heart failure) (HCC)    Atrial fibrillation with rapid ventricular response, (new onset).   Acute diastolic heart failure/ pulmonary hypertension Initially on Cardizem drip.  Cardiology was consulted.  Transthoracic echocardiogram showed LVEF of 60 to 65% with severe pulmonary hypertension with PASP of 22mHg concern for possible thrombus or myxoma in the right atrium.  There was initially a plan for TEE/DCCV however due to concern for right atrial mass concerning for possible thrombus cardiology focused on rate control.  Now on Lopressor 100 mg twice daily with heart rates improved.  Did have some elevated heart rates with ambulation working with physical therapy and therefore Cardizem CD1 20 mg daily was started.  Remains on Eliquis 2.5 mg twice daily.  Right atrium thrombus TTE showed mobile filamentous structure in the right atrium concerning for possible thrombus or myxoma.  TEE showed mobile filamentous mass measuring 1.1 cm about 0.6 cm in the right atrium which appears to be attached to the eustachian valve and extends to the intra-atrial septum.  Cultures were negative except for 1 contaminated bottle with staph epi not consistent with endocarditis.  Cardiology will continue with anticoagulation with apixaban and then repeat echocardiogram in 4 to 6 weeks see if  thrombus has resolved.  VQ scan was negative for any pulmonary embolism.  Lower extremity venous Dopplers were negative for DVT.  Developed some pulmonary edema during this hospitalization and has been getting intermittent IV Lasix.  Will be discharged on p.o. Lasix 20 mg daily.  Appears euvolemic on exam.   2. AKI on CKD IV with metabolic acidosis. Resolving with creatinine near baseline.  Last creatinine 2.4.  Baseline appears to be around 1.6-1.9 per nephrology prior to this hospitalization.  This may be her new baseline.  Discontinued sodium bicarbonate.   3. HTN/ dyslipidemia. Continue blood pressure control with metoprolol.   Continue with atorvastatin and ezetimibe.    4. Anemia of chronic disease.  Hgb stable  5. CAP -completed course of Zithromax. Remains on rocephin     Procedures: TTE 03/19/2021: Impressions: 1. Left ventricular ejection fraction, by estimation, is 60 to 65%. The  left ventricle has normal function. The left ventricle has no regional  wall motion abnormalities. There is severe left ventricular hypertrophy.  Left ventricular diastolic function  could not be evaluated.   2. Right ventricular systolic function is normal. The right ventricular  size is normal. There is severely elevated pulmonary artery systolic  pressure. The estimated right ventricular systolic pressure is [id number].   3. Left atrial size was moderately dilated.   4. Mobile filamentous structure, which appears attached to the  interatrial septum - consider thrombus or possible myxoma (image 67/68) -  measuring ~0.6 x 2.0 cm. Right atrial size was mildly dilated.   5. The mitral valve is abnormal. Mild mitral valve regurgitation.  6. The aortic valve is tricuspid. Aortic valve regurgitation is mild.  Mild aortic valve sclerosis is present, with no evidence of aortic valve  stenosis.   7. The inferior vena cava is dilated in size with >50% respiratory  variability, suggesting right atrial  pressure of 8 mmHg.   Comparison(s): Changes from prior study are noted. 10/29/2020: LVEF 60-65%,  mobile RA mass can be seen but not noted in this study. _______________   Consultations: TEE 03/20/2021: Impressions: 1. Mobile filamentous mass measuring 1.7cm x 0.6cm in the right atrium,  appears attached to eustachian valve and extends to the interatrial  septum. Suspect likely thrombus but would also recommend checking blood  cultures to evaluate for endocarditis.  Cardioversion deferred given suspected RA thrombus   2. Left ventricular ejection fraction, by estimation, is 60 to 65%. The  left ventricle has normal function. There is severe left ventricular  hypertrophy.   3. Right ventricular systolic function is normal. The right ventricular  size is normal.   4. Left atrial size was mildly dilated. No left atrial/left atrial  appendage thrombus was detected.   5. The mitral valve is normal in structure. Mild mitral valve  regurgitation.   6. The aortic valve is tricuspid. Aortic valve regurgitation is mild.  Mild aortic valve sclerosis is present, with no evidence of aortic valve  stenosis.   7. Pulmonic valve regurgitation is mild to moderate.      DVT prophylaxis: Apixaban Code Status: Full code Family Communication: No family present at bedside Disposition:   Status is: Inpatient  Remains inpatient appropriate because:Unsafe d/c plan  Dispo:  Patient From: Home  Planned Disposition: Day  Medically stable for discharge: No          Consultants:  Nephrology, cardiology     Objective: Vitals:   03/24/21 2257 03/25/21 0401 03/25/21 0754 03/25/21 0947  BP: (!) 145/85 131/84  (!) 147/99  Pulse: 78 90  78  Resp: 15 20    Temp: 98.2 F (36.8 C) 97.8 F (36.6 C) 98.4 F (36.9 C)   TempSrc: Oral Oral Oral   SpO2: 93% 100%    Weight:      Height:        Intake/Output Summary (Last 24 hours) at 03/25/2021 1523 Last data filed at  03/25/2021 0755 Gross per 24 hour  Intake 340 ml  Output 350 ml  Net -10 ml    Filed Weights   03/22/21 0447 03/23/21 0554 03/24/21 0338  Weight: 54.4 kg 53.3 kg 53.6 kg    Examination:  General exam: Appears calm and comfortable.  Presbycusis Respiratory system: Clear to auscultation. Respiratory effort normal. Cardiovascular system: Irregular rhythm.  Regular rate. Gastrointestinal system: Abdomen is nondistended, soft and nontender. No organomegaly or masses felt. Normal bowel sounds heard. Central nervous system: Alert and oriented. No focal neurological deficits. Extremities: Symmetric 5 x 5 power. Skin: No rashes, lesions or ulcers Psychiatry: Judgement and insight appear normal. Mood & affect appropriate.     Data Reviewed: I have personally reviewed following labs and imaging studies  CBC: Recent Labs  Lab 03/19/21 0309 03/20/21 0007 03/22/21 0027 03/23/21 0101 03/25/21 0047  WBC 13.7* 15.8* 17.9* 15.0* 12.9*  NEUTROABS  --   --  9.7* 7.5  --   HGB 11.3* 10.7* 10.0* 9.3* 9.3*  HCT 34.6* 33.7* 31.3* 29.3* 28.7*  MCV 86.1 86.2 87.4 87.7 87.2  PLT 220 254 279 285 282     Basic Metabolic Panel: Recent  Labs  Lab 03/19/21 0309 03/20/21 0007 03/21/21 0035 03/22/21 0027 03/23/21 0101 03/25/21 0047  NA 137 140 137 136 138 138  K 4.3 4.0 4.4 4.4 4.5 4.0  CL 107 110 110 110 106 105  CO2 17* 19* 19* 18* 21* 26  GLUCOSE 120* 111* 143* 133* 123* 114*  BUN 77* 67* 63* 60* 54* 47*  CREATININE 3.86* 2.91* 2.67* 2.75* 2.32* 2.49*  CALCIUM 8.5* 8.6* 8.5* 8.7* 9.2 9.1  PHOS 2.9 2.6  --   --   --   --      GFR: Estimated Creatinine Clearance: 11.7 mL/min (A) (by C-G formula based on SCr of 2.49 mg/dL (H)).  Liver Function Tests: Recent Labs  Lab 03/19/21 0309 03/20/21 0007  ALBUMIN 2.8* 2.9*     CBG: No results for input(s): GLUCAP in the last 168 hours.   Recent Results (from the past 240 hour(s))  Resp Panel by RT-PCR (Flu A&B, Covid)  Nasopharyngeal Swab     Status: None   Collection Time: 03/17/21 11:33 PM   Specimen: Nasopharyngeal Swab; Nasopharyngeal(NP) swabs in vial transport medium  Result Value Ref Range Status   SARS Coronavirus 2 by RT PCR NEGATIVE NEGATIVE Final    Comment: (NOTE) SARS-CoV-2 target nucleic acids are NOT DETECTED.  The SARS-CoV-2 RNA is generally detectable in upper respiratory specimens during the acute phase of infection. The lowest concentration of SARS-CoV-2 viral copies this assay can detect is 138 copies/mL. A negative result does not preclude SARS-Cov-2 infection and should not be used as the sole basis for treatment or other patient management decisions. A negative result may occur with  improper specimen collection/handling, submission of specimen other than nasopharyngeal swab, presence of viral mutation(s) within the areas targeted by this assay, and inadequate number of viral copies(<138 copies/mL). A negative result must be combined with clinical observations, patient history, and epidemiological information. The expected result is Negative.  Fact Sheet for Patients:  EntrepreneurPulse.com.au  Fact Sheet for Healthcare Providers:  IncredibleEmployment.be  This test is no t yet approved or cleared by the [place] FDA and  has been authorized for detection and/or diagnosis of SARS-CoV-2 by FDA under an Emergency Use Authorization (EUA). This EUA will remain  in effect (meaning this test can be used) for the duration of the COVID-19 declaration under Section 564(b)(1) of the Act, 21 U.S.C.section 360bbb-3(b)(1), unless the authorization is terminated  or revoked sooner.       Influenza A by PCR NEGATIVE NEGATIVE Final   Influenza B by PCR NEGATIVE NEGATIVE Final    Comment: (NOTE) The Xpert Xpress SARS-CoV-2/FLU/RSV plus assay is intended as an aid in the diagnosis of influenza from Nasopharyngeal swab specimens and should not be  used as a sole basis for treatment. Nasal washings and aspirates are unacceptable for Xpert Xpress SARS-CoV-2/FLU/RSV testing.  Fact Sheet for Patients: EntrepreneurPulse.com.au  Fact Sheet for Healthcare Providers: IncredibleEmployment.be  This test is not yet approved or cleared by the [place] FDA and has been authorized for detection and/or diagnosis of SARS-CoV-2 by FDA under an Emergency Use Authorization (EUA). This EUA will remain in effect (meaning this test can be used) for the duration of the COVID-19 declaration under Section 564(b)(1) of the Act, 21 U.S.C. section 360bbb-3(b)(1), unless the authorization is terminated or revoked.  Performed at [facility], [place] [address] [place], [place] [address]   Culture, blood (routine x 2)     Status: None   Collection Time: 03/20/21 10:42  AM   Specimen: BLOOD  Result Value Ref Range Status   Specimen Description BLOOD RIGHT ANTECUBITAL  Final   Special Requests   Final    BOTTLES DRAWN AEROBIC AND ANAEROBIC Blood Culture adequate volume   Culture   Final    NO GROWTH 5 DAYS Performed at [facility], [address] [place], [place] [address]    Report Status 03/25/2021 FINAL  Final  Culture, blood (routine x 2)     Status: Abnormal   Collection Time: 03/20/21 10:42 AM   Specimen: BLOOD RIGHT ARM  Result Value Ref Range Status   Specimen Description BLOOD RIGHT ARM  Final   Special Requests   Final    BOTTLES DRAWN AEROBIC AND ANAEROBIC Blood Culture adequate volume   Culture  Setup Time   Final    GRAM POSITIVE COCCI AEROBIC BOTTLE ONLY CRITICAL RESULT CALLED TO, READ BACK BY AND VERIFIED WITH: PHARMD J.FRENS AT 0813 ON 03/21/2021 BY T.SAAD.    Culture (A)  Final    STAPHYLOCOCCUS EPIDERMIDIS THE SIGNIFICANCE OF ISOLATING THIS ORGANISM FROM A SINGLE SET OF BLOOD CULTURES WHEN MULTIPLE SETS ARE DRAWN IS UNCERTAIN. PLEASE NOTIFY THE MICROBIOLOGY DEPARTMENT WITHIN  ONE WEEK IF SPECIATION AND SENSITIVITIES ARE REQUIRED. Performed at [facility], [place] [address], [place], [place] [address]    Report Status 03/23/2021 FINAL  Final  Blood Culture ID Panel (Reflexed)     Status: Abnormal   Collection Time: 03/20/21 10:42 AM  Result Value Ref Range Status   Enterococcus faecalis NOT DETECTED NOT DETECTED Final   Enterococcus Faecium NOT DETECTED NOT DETECTED Final   Listeria monocytogenes NOT DETECTED NOT DETECTED Final   Staphylococcus species DETECTED (A) NOT DETECTED Final    Comment: CRITICAL RESULT CALLED TO, READ BACK BY AND VERIFIED WITH: PHARMD J.FRENS AT [id number] ON 03/21/2021 BY .T.SAAD.    Staphylococcus aureus (BCID) NOT DETECTED NOT DETECTED Final   Staphylococcus epidermidis DETECTED (A) NOT DETECTED Final    Comment: CRITICAL RESULT CALLED TO, READ BACK BY AND VERIFIED WITH: PHARMD J.FRENS AT [id number] ON 03/21/2021 BY .T.SAAD.    Staphylococcus lugdunensis NOT DETECTED NOT DETECTED Final   Streptococcus species NOT DETECTED NOT DETECTED Final   Streptococcus agalactiae NOT DETECTED NOT DETECTED Final   Streptococcus pneumoniae NOT DETECTED NOT DETECTED Final   Streptococcus pyogenes NOT DETECTED NOT DETECTED Final   A.calcoaceticus-baumannii NOT DETECTED NOT DETECTED Final   Bacteroides fragilis NOT DETECTED NOT DETECTED Final   Enterobacterales NOT DETECTED NOT DETECTED Final   Enterobacter cloacae complex NOT DETECTED NOT DETECTED Final   Escherichia coli NOT DETECTED NOT DETECTED Final   Klebsiella aerogenes NOT DETECTED NOT DETECTED Final   Klebsiella oxytoca NOT DETECTED NOT DETECTED Final   Klebsiella pneumoniae NOT DETECTED NOT DETECTED Final   Proteus species NOT DETECTED NOT DETECTED Final   Salmonella species NOT DETECTED NOT DETECTED Final   Serratia marcescens NOT DETECTED NOT DETECTED Final   Haemophilus influenzae NOT DETECTED NOT DETECTED Final   Neisseria meningitidis NOT DETECTED NOT DETECTED Final   Pseudomonas  aeruginosa NOT DETECTED NOT DETECTED Final   Stenotrophomonas maltophilia NOT DETECTED NOT DETECTED Final   Candida albicans NOT DETECTED NOT DETECTED Final   Candida auris NOT DETECTED NOT DETECTED Final   Candida glabrata NOT DETECTED NOT DETECTED Final   Candida krusei NOT DETECTED NOT DETECTED Final   Candida parapsilosis NOT DETECTED NOT DETECTED Final   Candida tropicalis NOT DETECTED NOT DETECTED Final  Cryptococcus neoformans/gattii NOT DETECTED NOT DETECTED Final   Methicillin resistance mecA/C NOT DETECTED NOT DETECTED Final    Comment: Performed at [facility], [place] [address] [place], [place] [address]  SARS CORONAVIRUS 2 (TAT 6-24 HRS) Nasopharyngeal Nasopharyngeal Swab     Status: None   Collection Time: 03/22/21  2:36 PM   Specimen: Nasopharyngeal Swab  Result Value Ref Range Status   SARS Coronavirus 2 NEGATIVE NEGATIVE Final    Comment: (NOTE) SARS-CoV-2 target nucleic acids are NOT DETECTED.  The SARS-CoV-2 RNA is generally detectable in upper and lower respiratory specimens during the acute phase of infection. Negative results do not preclude SARS-CoV-2 infection, do not rule out co-infections with other pathogens, and should not be used as the sole basis for treatment or other patient management decisions. Negative results must be combined with clinical observations, patient history, and epidemiological information. The expected result is Negative.  Fact Sheet for Patients: SugarRoll.be  Fact Sheet for Healthcare Providers: https://www.woods-mathews.com/  This test is not yet approved or cleared by the [place] FDA and  has been authorized for detection and/or diagnosis of SARS-CoV-2 by FDA under an Emergency Use Authorization (EUA). This EUA will remain  in effect (meaning this test can be used) for the duration of the COVID-19 declaration under Se ction 564(b)(1) of the Act, 21 U.S.C. section  360bbb-3(b)(1), unless the authorization is terminated or revoked sooner.  Performed at [facility], [place] [address], [place], [place] [address]           Radiology Studies: No results found.      Scheduled Meds:  apixaban  2.5 mg Oral BID   atorvastatin  40 mg Oral Daily   diltiazem  120 mg Oral Daily   ezetimibe  10 mg Oral Daily   metoprolol tartrate  100 mg Oral BID   multivitamin with minerals  1 tablet Oral Daily   sodium bicarbonate  650 mg Oral TID   Continuous Infusions:  cefTRIAXone (ROCEPHIN)  IV 200 mL/hr at 03/25/21 0401     LOS: 7 days    Time spent: 35 minutes    Leslee Home, MD [facility]   To contact the attending provider between 7A-7P or the covering provider during after hours 7P-7A, please log into the web site [email or web address] and access using universal [place] password for that web site. If you do not have the password, please call the hospital operator.  03/25/2021, 3:23 PM

## 2021-03-25 NOTE — TOC Progression Note (Addendum)
Transition of Care (TOC) - Progression Note  Marvetta Gibbons RN, BSN Transitions of Care Unit 4E- RN Case Manager See Treatment Team for direct phone #  Weekend cross coverage 2C  Patient Details  Name: Heather Saunders MRN: [id number] Date of Birth: 26-Dec-1924  Transition of Care [facility]) CM/SW Contact  [name], Romeo Rabon, RN Phone Number: 03/25/2021, 12:30 PM  Clinical Narrative:    Per MD pt stable for transition home today, Orders placed fro HHRN/PT/OT/aide. CM spoke with pt at bedside for transition of care needs and to offer choice for [facility] Per CMS guidelines from medicare.gov website with star ratings (copy placed in shadow chart) . On discussion with pt she states that she is having to go home because "my insurance denied my rehab stay". In review of the chart notes it looks like insurance has denied initially however a P2P was offered with a deadline of Monday June 27 at 10am. It is hard to tell if the Peer to Peer has been completed or not- as do not see documentation about it being done.   Pt request CM to call her nephew to discuss as he is helping her with decisions. Address, phone # and PCP all confirmed with pt as well as her Nephews name and phone #.   Call made to Verlin Grills- nephew- and per phone conversation- he states that he was told about a Peer to Peer option by an MD yesterday but does not know if that avenue was explored with insurance or not. He reports that he and the family has discussed the situation as well as spoken with the patient and they all agree that they feel [facility] rehab would be the best for patient as she lives alone, and has no family locally to assist. They want to explore all options with insurance for approval on rehab including P2P-  nephew understands that if insurance upholds the denial after P2P then options would be to pay out of pocket for rehab or return home with [facility] and private duty assistance if needed.   Contacted MD and CSW regarding families  request to move forward with P2P to see if insurance will approve SNF before moving forward with discharge to home with [facility].   TOC will f/u with Shanon Brow- tomorrow. If pt denied again, will need to get choice for [facility] agency.    Expected Discharge Plan: [place] Barriers to Discharge: Continued Medical Work up, SNF Pending bed offer, Ship broker  Expected Discharge Plan and Services Expected Discharge Plan: [place] In-house Referral: Clinical Social Work Discharge Planning Services: CM Consult Post Acute Care Choice: [place] Living arrangements for the past 2 months: [place] Expected Discharge Date: 03/25/21                         HH Arranged: RN, PT, OT, Nurse's Aide           Social Determinants of Health (SDOH) Interventions    Readmission Risk Interventions No flowsheet data found.

## 2021-03-26 LAB — CBC
HCT: 30.1 % — ABNORMAL LOW (ref 36.0–46.0)
Hemoglobin: 9.7 g/dL — ABNORMAL LOW (ref 12.0–15.0)
MCH: 28.4 pg (ref 26.0–34.0)
MCHC: 32.2 g/dL (ref 30.0–36.0)
MCV: 88.3 fL (ref 80.0–100.0)
Platelets: 288 10*3/uL (ref 150–400)
RBC: 3.41 MIL/uL — ABNORMAL LOW (ref 3.87–5.11)
RDW: 15.9 % — ABNORMAL HIGH (ref 11.5–15.5)
WBC: 14.5 10*3/uL — ABNORMAL HIGH (ref 4.0–10.5)
nRBC: 0 % (ref 0.0–0.2)

## 2021-03-26 LAB — BASIC METABOLIC PANEL
Anion gap: 9 (ref 5–15)
BUN: 45 mg/dL — ABNORMAL HIGH (ref 8–23)
CO2: 24 mmol/L (ref 22–32)
Calcium: 9.1 mg/dL (ref 8.9–10.3)
Chloride: 103 mmol/L (ref 98–111)
Creatinine, Ser: 2.4 mg/dL — ABNORMAL HIGH (ref 0.44–1.00)
GFR, Estimated: 18 mL/min — ABNORMAL LOW (ref >60)
Glucose, Bld: 123 mg/dL — ABNORMAL HIGH (ref 70–99)
Potassium: 4.2 mmol/L (ref 3.5–5.1)
Sodium: 136 mmol/L (ref 135–145)

## 2021-03-26 MED ORDER — APIXABAN 2.5 MG PO TABS: 2.5000 mg | ORAL_TABLET | Freq: Two times a day (BID) | ORAL | 0 refills | Status: DC

## 2021-03-26 MED ORDER — METOPROLOL TARTRATE 100 MG PO TABS: 100.0000 mg | ORAL_TABLET | Freq: Two times a day (BID) | ORAL | 0 refills | Status: DC

## 2021-03-26 MED ORDER — FUROSEMIDE 20 MG PO TABS: 20.0000 mg | ORAL_TABLET | Freq: Every day | ORAL | 0 refills | Status: DC

## 2021-03-26 MED ORDER — CEFDINIR 300 MG PO CAPS: 300.0000 mg | ORAL_CAPSULE | Freq: Two times a day (BID) | ORAL | 0 refills | Status: DC

## 2021-03-26 NOTE — TOC Progression Note (Addendum)
Transition of Care [facility]) - Progression Note    Patient Details  Name: Heather Saunders MRN: [id number] Date of Birth: 1924/10/10  Transition of Care [facility]) CM/SW [place], [place] Phone Number: 03/26/2021, 11:54 AM  Clinical Narrative:    [id number]: CSW spoke with pt nephew to provide update about insurance being denied after peer to peer. Pt nephew asked about another option bc they are not comfortable with pt going straight home, CSW provided family with appeal number and will follow up after both nephews speaks with one another.  1500: CSW spoke with [name] who states that he would like to switch pt to [facility] if [name] is approved.   Expected Discharge Plan: [place] Barriers to Discharge: Continued Medical Work up, SNF Pending bed offer, Ship broker  Expected Discharge Plan and Services Expected Discharge Plan: [place] In-house Referral: Clinical Social Work Discharge Planning Services: CM Consult Post Acute Care Choice: [place] Living arrangements for the past 2 months: [place] Expected Discharge Date: 03/26/21                         HH Arranged: RN, PT, OT, Nurse's Aide           Social Determinants of Health (SDOH) Interventions    Readmission Risk Interventions No flowsheet data found.

## 2021-03-26 NOTE — TOC Progression Note (Signed)
Transition of Care [facility]) - Progression Note    Patient Details  Name: Heather Saunders MRN: [id number] Date of Birth: [birth year]  Transition of Care [facility]) CM/SW Contact  Zenon Mayo, RN Phone Number: 03/26/2021, 12:07 PM  Clinical Narrative:    NCM contacted Shanon Brow to talk about [facility] services.  He states he just spoke with somone on the phone and he will be appealing the denial.  NCM notified MD and CSW of this information.   Expected Discharge Plan: [place] Barriers to Discharge: Continued Medical Work up, SNF Pending bed offer, Ship broker  Expected Discharge Plan and Services Expected Discharge Plan: [place] In-house Referral: Clinical Social Work Discharge Planning Services: CM Consult Post Acute Care Choice: [place] Living arrangements for the past 2 months: [place] Expected Discharge Date: 03/26/21                         HH Arranged: RN, PT, OT, Nurse's Aide           Social Determinants of Health (SDOH) Interventions    Readmission Risk Interventions No flowsheet data found.

## 2021-03-26 NOTE — Care Management Important Message (Signed)
Important Message  Patient Details  Name: Heather Saunders MRN: [id number] Date of Birth: Feb 16, 1925   Medicare Important Message Given:  Yes     [name] P Raiford 03/26/2021, 3:07 PM

## 2021-03-26 NOTE — Progress Notes (Signed)
Physical Therapy Treatment Patient Details Name: Heather Saunders MRN: [id number] DOB: 1925/03/22 Today's Date: 03/26/2021    History of Present Illness Pt is a 87 y/o female presenting on 6/18 to ED for sudden onset of throat discomfort and concern for possible anginal equivalent. Pt found to have AKI on CKD . Pt with new onset a fib with RVR with pulmonary edema. On 6/21, pt had TEE with RA thrombus noted. PMH: coronary artery disease, essential hypertension, hyperlipidemia.    PT Comments    Pt making good progress.  Had been recommending SNF due to decreased safety and pt living alone; however, insurance has denied SNF.  Since having to go home, recommend increased support from family, friends, with max HH services.  Also, recommend rollator as pt better able to maneuver rollator and can use for rest breaks.   Notified case Freight forwarder.  Follow Up Recommendations  Home health PT;Supervision for mobility/OOB (Insurance has denied SNF)     Equipment Recommendations  Other (comment) (rollator)    Recommendations for Other Services       Precautions / Restrictions Precautions Precautions: Fall;Other (comment) Precaution Comments: watch HR    Mobility  Bed Mobility Overal bed mobility: Needs Assistance Bed Mobility: Supine to Sit     Supine to sit: Supervision;HOB elevated          Transfers Overall transfer level: Needs assistance Equipment used: Rolling walker (2 wheeled);4-wheeled walker Transfers: Sit to/from Stand Sit to Stand: Supervision         General transfer comment: Performed x 3 during session.  Repeated education on rollator brake use (see general comments)  Ambulation/Gait Ambulation/Gait assistance: Supervision Gait Distance (Feet): 250 Feet (250'x2) Assistive device: Rolling walker (2 wheeled);4-wheeled walker Gait Pattern/deviations: Step-through pattern;Trunk flexed     General Gait Details: Cued for posture.  Started with RW but pt reports  difficult to control and required cues to avoid objects. She had used rollator on previous sessions.  Tried with rollator and pt able to better avoid objects.  See general comments   Stairs             Wheelchair Mobility    Modified Rankin (Stroke Patients Only)       Balance Overall balance assessment: Needs assistance Sitting-balance support: Feet supported Sitting balance-Leahy Scale: Good     Standing balance support: No upper extremity supported Standing balance-Leahy Scale: Fair Standing balance comment: able to stand without AD, rollator for gait                            Cognition Arousal/Alertness: Awake/alert Behavior During Therapy: WFL for tasks assessed/performed Overall Cognitive Status: Within Functional Limits for tasks assessed                                        Exercises      General Comments General comments (skin integrity, edema, etc.): HR 99 bpm and HR up to 130 max with ambulation.  Provided repeated education on rollator use and safety.  Had pt provide teach back on locking and unlocking brakes.  Also, educated on pushing against wall for added safety if sitting on rolaltor      Pertinent Vitals/Pain Pain Assessment: No/denies pain    Home Living  Prior Function            PT Goals (current goals can now be found in the care plan section) Acute Rehab PT Goals Patient Stated Goal: Return to baseline and go home PT Goal Formulation: With patient Time For Goal Achievement: 04/01/21 Potential to Achieve Goals: Good Progress towards PT goals: Progressing toward goals    Frequency    Min 3X/week      PT Plan Discharge plan needs to be updated    Co-evaluation              AM-PAC PT "6 Clicks" Mobility   Outcome Measure  Help needed turning from your back to your side while in a flat bed without using bedrails?: None Help needed moving from lying on your  back to sitting on the side of a flat bed without using bedrails?: None Help needed moving to and from a bed to a chair (including a wheelchair)?: None Help needed standing up from a chair using your arms (e.g., wheelchair or bedside chair)?: A Little Help needed to walk in hospital room?: A Little Help needed climbing 3-5 steps with a railing? : A Little 6 Click Score: 21    End of Session Equipment Utilized During Treatment: Gait belt Activity Tolerance: Patient tolerated treatment well Patient left: in chair;with call bell/phone within reach Nurse Communication: Mobility status PT Visit Diagnosis: Unsteadiness on feet (R26.81);Other abnormalities of gait and mobility (R26.89);Muscle weakness (generalized) (M62.81);Difficulty in walking, not elsewhere classified (R26.2)     Time: [id number] PT Time Calculation (min) (ACUTE ONLY): 31 min  Charges:  $Gait Training: 23-37 mins                     Abran Richard, PT Acute Rehab Services Pager [phone] [facility] [place] 03/26/2021, 11:28 AM

## 2021-03-26 NOTE — Progress Notes (Signed)
PROGRESS NOTE    Heather Saunders  [id number] DOB: 12-Sep-1925 DOA: 03/17/2021 PCP: Kathyrn Lass, MD   Brief Narrative: Patient was admitted for A. fib with RVR acute diastolic heart failure and AKI on CKD 4.  Subjective: No acute events reported by nursing staff overnight.  Seen and examined at bedside this morning.  Heart rates stable.   Assessment & Plan:   Principal Problem:   Atrial fibrillation with RVR (HCC) Active Problems:   Essential hypertension   Mixed hyperlipidemia   AKI (acute kidney injury) ([place])   Acute kidney injury ([place])   Pulmonary HTN (HCC)   Right atrial mass   Acute diastolic CHF (congestive heart failure) (HCC)    Atrial fibrillation with rapid ventricular response, (new onset).   Acute diastolic heart failure/ pulmonary hypertension Initially on Cardizem drip.  Cardiology was consulted.  Transthoracic echocardiogram showed LVEF of 60 to 65% with severe pulmonary hypertension with PASP of 28mHg concern for possible thrombus or myxoma in the right atrium.  There was initially a plan for TEE/DCCV however due to concern for right atrial mass concerning for possible thrombus cardiology focused on rate control.  Now on Lopressor 100 mg twice daily with heart rates improved.  Did have some elevated heart rates with ambulation working with physical therapy and therefore Cardizem CD1 20 mg daily was started.  Remains on Eliquis 2.5 mg twice daily.  -P2P denied for SNF. Family appealing. If appeal denies will go home with HHS.  Right atrium thrombus TTE showed mobile filamentous structure in the right atrium concerning for possible thrombus or myxoma.  TEE showed mobile filamentous mass measuring 1.1 cm about 0.6 cm in the right atrium which appears to be attached to the eustachian valve and extends to the intra-atrial septum.  Cultures were negative except for 1 contaminated bottle with staph epi not consistent with endocarditis.  Cardiology will continue with  anticoagulation with apixaban and then repeat echocardiogram in 4 to 6 weeks see if thrombus has resolved.  VQ scan was negative for any pulmonary embolism.  Lower extremity venous Dopplers were negative for DVT.  Developed some pulmonary edema during this hospitalization and has been getting intermittent IV Lasix.  Will be discharged on p.o. Lasix 20 mg daily.  Appears euvolemic on exam.   2. AKI on CKD IV with metabolic acidosis. Resolving with creatinine near baseline.  Last creatinine 2.4.  Baseline appears to be around 1.6-1.9 per nephrology prior to this hospitalization.  This may be her new baseline per nephrology.  Discontinued sodium bicarbonate as metabolic acidosis has resolved.   3. HTN/ dyslipidemia. Continue blood pressure control with metoprolol.   Continue with atorvastatin and ezetimibe.    4. Anemia of chronic disease.  Hgb stable  5. CAP -completed course of Zithromax. Remains on rocephin.     Procedures: TTE 03/19/2021: Impressions: 1. Left ventricular ejection fraction, by estimation, is 60 to 65%. The  left ventricle has normal function. The left ventricle has no regional  wall motion abnormalities. There is severe left ventricular hypertrophy.  Left ventricular diastolic function  could not be evaluated.   2. Right ventricular systolic function is normal. The right ventricular  size is normal. There is severely elevated pulmonary artery systolic  pressure. The estimated right ventricular systolic pressure is [id number].   3. Left atrial size was moderately dilated.   4. Mobile filamentous structure, which appears attached to the  interatrial septum - consider thrombus or possible myxoma (image 67/68) -  measuring ~  0.6 x 2.0 cm. Right atrial size was mildly dilated.   5. The mitral valve is abnormal. Mild mitral valve regurgitation.   6. The aortic valve is tricuspid. Aortic valve regurgitation is mild.  Mild aortic valve sclerosis is present, with no evidence  of aortic valve  stenosis.   7. The inferior vena cava is dilated in size with >50% respiratory  variability, suggesting right atrial pressure of 8 mmHg.   Comparison(s): Changes from prior study are noted. 10/29/2020: LVEF 60-65%,  mobile RA mass can be seen but not noted in this study. _______________   Consultations: TEE 03/20/2021: Impressions: 1. Mobile filamentous mass measuring 1.7cm x 0.6cm in the right atrium,  appears attached to eustachian valve and extends to the interatrial  septum. Suspect likely thrombus but would also recommend checking blood  cultures to evaluate for endocarditis.  Cardioversion deferred given suspected RA thrombus   2. Left ventricular ejection fraction, by estimation, is 60 to 65%. The  left ventricle has normal function. There is severe left ventricular  hypertrophy.   3. Right ventricular systolic function is normal. The right ventricular  size is normal.   4. Left atrial size was mildly dilated. No left atrial/left atrial  appendage thrombus was detected.   5. The mitral valve is normal in structure. Mild mitral valve  regurgitation.   6. The aortic valve is tricuspid. Aortic valve regurgitation is mild.  Mild aortic valve sclerosis is present, with no evidence of aortic valve  stenosis.   7. Pulmonic valve regurgitation is mild to moderate.      DVT prophylaxis: Apixaban Code Status: Full code Family Communication: No family present at bedside Disposition:   Status is: Inpatient  Remains inpatient appropriate because:Unsafe d/c plan  Dispo: HHS  Patient From: Home  Planned Disposition: HHS  Medically stable for discharge: Yes          Consultants:  Nephrology, cardiology     Objective: Vitals:   03/26/21 0727 03/26/21 1138 03/26/21 1239 03/26/21 1517  BP:  (!) 164/90  (!) 163/84  Pulse: 93 97 88 76  Resp: 20   18  Temp: 97.8 F (36.6 C)  (!) 97.4 F (36.3 C) (!) 97.5 F (36.4 C)  TempSrc: Oral  Oral Oral   SpO2:   93% 92%  Weight:      Height:        Intake/Output Summary (Last 24 hours) at 03/26/2021 1624 Last data filed at 03/26/2021 1242 Gross per 24 hour  Intake 430 ml  Output 950 ml  Net -520 ml    Filed Weights   03/23/21 0554 03/24/21 0338 03/26/21 0401  Weight: 53.3 kg 53.6 kg 53.7 kg    Examination:  General exam: Appears calm and comfortable.  Presbycusis Respiratory system: Clear to auscultation. Respiratory effort normal. Cardiovascular system: Irregular rhythm.  Regular rate. Gastrointestinal system: Abdomen is nondistended, soft and nontender. No organomegaly or masses felt. Normal bowel sounds heard. Central nervous system: Alert and oriented. No focal neurological deficits. Extremities: Symmetric 5 x 5 power. Skin: No rashes, lesions or ulcers Psychiatry: Judgement and insight appear normal. Mood & affect appropriate.     Data Reviewed: I have personally reviewed following labs and imaging studies  CBC: Recent Labs  Lab 03/20/21 0007 03/22/21 0027 03/23/21 0101 03/25/21 0047 03/26/21 0041  WBC 15.8* 17.9* 15.0* 12.9* 14.5*  NEUTROABS  --  9.7* 7.5  --   --   HGB 10.7* 10.0* 9.3* 9.3* 9.7*  HCT 33.7* 31.3*  29.3* 28.7* 30.1*  MCV 86.2 87.4 87.7 87.2 88.3  PLT 254 279 285 282 288     Basic Metabolic Panel: Recent Labs  Lab 03/20/21 0007 03/21/21 0035 03/22/21 0027 03/23/21 0101 03/25/21 0047 03/26/21 0041  NA 140 137 136 138 138 136  K 4.0 4.4 4.4 4.5 4.0 4.2  CL 110 110 110 106 105 103  CO2 19* 19* 18* 21* 26 24  GLUCOSE 111* 143* 133* 123* 114* 123*  BUN 67* 63* 60* 54* 47* 45*  CREATININE 2.91* 2.67* 2.75* 2.32* 2.49* 2.40*  CALCIUM 8.6* 8.5* 8.7* 9.2 9.1 9.1  PHOS 2.6  --   --   --   --   --      GFR: Estimated Creatinine Clearance: 12.2 mL/min (A) (by C-G formula based on SCr of 2.4 mg/dL (H)).  Liver Function Tests: Recent Labs  Lab 03/20/21 0007  ALBUMIN 2.9*     CBG: No results for input(s): GLUCAP in the last 168  hours.   Recent Results (from the past 240 hour(s))  Resp Panel by RT-PCR (Flu A&B, Covid) Nasopharyngeal Swab     Status: None   Collection Time: 03/17/21 11:33 PM   Specimen: Nasopharyngeal Swab; Nasopharyngeal(NP) swabs in vial transport medium  Result Value Ref Range Status   SARS Coronavirus 2 by RT PCR NEGATIVE NEGATIVE Final    Comment: (NOTE) SARS-CoV-2 target nucleic acids are NOT DETECTED.  The SARS-CoV-2 RNA is generally detectable in upper respiratory specimens during the acute phase of infection. The lowest concentration of SARS-CoV-2 viral copies this assay can detect is 138 copies/mL. A negative result does not preclude SARS-Cov-2 infection and should not be used as the sole basis for treatment or other patient management decisions. A negative result may occur with  improper specimen collection/handling, submission of specimen other than nasopharyngeal swab, presence of viral mutation(s) within the areas targeted by this assay, and inadequate number of viral copies(<138 copies/mL). A negative result must be combined with clinical observations, patient history, and epidemiological information. The expected result is Negative.  Fact Sheet for Patients:  EntrepreneurPulse.com.au  Fact Sheet for Healthcare Providers:  IncredibleEmployment.be  This test is no t yet approved or cleared by the [place] FDA and  has been authorized for detection and/or diagnosis of SARS-CoV-2 by FDA under an Emergency Use Authorization (EUA). This EUA will remain  in effect (meaning this test can be used) for the duration of the COVID-19 declaration under Section 564(b)(1) of the Act, 21 U.S.C.section 360bbb-3(b)(1), unless the authorization is terminated  or revoked sooner.       Influenza A by PCR NEGATIVE NEGATIVE Final   Influenza B by PCR NEGATIVE NEGATIVE Final    Comment: (NOTE) The Xpert Xpress SARS-CoV-2/FLU/RSV plus assay is intended  as an aid in the diagnosis of influenza from Nasopharyngeal swab specimens and should not be used as a sole basis for treatment. Nasal washings and aspirates are unacceptable for Xpert Xpress SARS-CoV-2/FLU/RSV testing.  Fact Sheet for Patients: EntrepreneurPulse.com.au  Fact Sheet for Healthcare Providers: IncredibleEmployment.be  This test is not yet approved or cleared by the [place] FDA and has been authorized for detection and/or diagnosis of SARS-CoV-2 by FDA under an Emergency Use Authorization (EUA). This EUA will remain in effect (meaning this test can be used) for the duration of the COVID-19 declaration under Section 564(b)(1) of the Act, 21 U.S.C. section 360bbb-3(b)(1), unless the authorization is terminated or revoked.  Performed at [facility], [place]  [address], [place], [place] [address]   Culture, blood (routine x 2)     Status: None   Collection Time: 03/20/21 10:42 AM   Specimen: BLOOD  Result Value Ref Range Status   Specimen Description BLOOD RIGHT ANTECUBITAL  Final   Special Requests   Final    BOTTLES DRAWN AEROBIC AND ANAEROBIC Blood Culture adequate volume   Culture   Final    NO GROWTH 5 DAYS Performed at [facility], [place] [address], [place], [place] [address]    Report Status 03/25/2021 FINAL  Final  Culture, blood (routine x 2)     Status: Abnormal   Collection Time: 03/20/21 10:42 AM   Specimen: BLOOD RIGHT ARM  Result Value Ref Range Status   Specimen Description BLOOD RIGHT ARM  Final   Special Requests   Final    BOTTLES DRAWN AEROBIC AND ANAEROBIC Blood Culture adequate volume   Culture  Setup Time   Final    GRAM POSITIVE COCCI AEROBIC BOTTLE ONLY CRITICAL RESULT CALLED TO, READ BACK BY AND VERIFIED WITH: PHARMD J.FRENS AT 0813 ON 03/21/2021 BY T.SAAD.    Culture (A)  Final    STAPHYLOCOCCUS EPIDERMIDIS THE SIGNIFICANCE OF ISOLATING THIS ORGANISM FROM A SINGLE SET OF BLOOD CULTURES  WHEN MULTIPLE SETS ARE DRAWN IS UNCERTAIN. PLEASE NOTIFY THE MICROBIOLOGY DEPARTMENT WITHIN ONE WEEK IF SPECIATION AND SENSITIVITIES ARE REQUIRED. Performed at [facility], [place] [address], [place], [place] [address]    Report Status 03/23/2021 FINAL  Final  Blood Culture ID Panel (Reflexed)     Status: Abnormal   Collection Time: 03/20/21 10:42 AM  Result Value Ref Range Status   Enterococcus faecalis NOT DETECTED NOT DETECTED Final   Enterococcus Faecium NOT DETECTED NOT DETECTED Final   Listeria monocytogenes NOT DETECTED NOT DETECTED Final   Staphylococcus species DETECTED (A) NOT DETECTED Final    Comment: CRITICAL RESULT CALLED TO, READ BACK BY AND VERIFIED WITH: PHARMD J.FRENS AT [id number] ON 03/21/2021 BY .T.SAAD.    Staphylococcus aureus (BCID) NOT DETECTED NOT DETECTED Final   Staphylococcus epidermidis DETECTED (A) NOT DETECTED Final    Comment: CRITICAL RESULT CALLED TO, READ BACK BY AND VERIFIED WITH: PHARMD J.FRENS AT [id number] ON 03/21/2021 BY .T.SAAD.    Staphylococcus lugdunensis NOT DETECTED NOT DETECTED Final   Streptococcus species NOT DETECTED NOT DETECTED Final   Streptococcus agalactiae NOT DETECTED NOT DETECTED Final   Streptococcus pneumoniae NOT DETECTED NOT DETECTED Final   Streptococcus pyogenes NOT DETECTED NOT DETECTED Final   A.calcoaceticus-baumannii NOT DETECTED NOT DETECTED Final   Bacteroides fragilis NOT DETECTED NOT DETECTED Final   Enterobacterales NOT DETECTED NOT DETECTED Final   Enterobacter cloacae complex NOT DETECTED NOT DETECTED Final   Escherichia coli NOT DETECTED NOT DETECTED Final   Klebsiella aerogenes NOT DETECTED NOT DETECTED Final   Klebsiella oxytoca NOT DETECTED NOT DETECTED Final   Klebsiella pneumoniae NOT DETECTED NOT DETECTED Final   Proteus species NOT DETECTED NOT DETECTED Final   Salmonella species NOT DETECTED NOT DETECTED Final   Serratia marcescens NOT DETECTED NOT DETECTED Final   Haemophilus influenzae NOT DETECTED NOT  DETECTED Final   Neisseria meningitidis NOT DETECTED NOT DETECTED Final   Pseudomonas aeruginosa NOT DETECTED NOT DETECTED Final   Stenotrophomonas maltophilia NOT DETECTED NOT DETECTED Final   Candida albicans NOT DETECTED NOT DETECTED Final   Candida auris NOT DETECTED NOT DETECTED Final   Candida glabrata NOT DETECTED NOT DETECTED Final   Candida krusei NOT  DETECTED NOT DETECTED Final   Candida parapsilosis NOT DETECTED NOT DETECTED Final   Candida tropicalis NOT DETECTED NOT DETECTED Final   Cryptococcus neoformans/gattii NOT DETECTED NOT DETECTED Final   Methicillin resistance mecA/C NOT DETECTED NOT DETECTED Final    Comment: Performed at [facility], [place] [address], [place], [place] [address]  SARS CORONAVIRUS 2 (TAT 6-24 HRS) Nasopharyngeal Nasopharyngeal Swab     Status: None   Collection Time: 03/22/21  2:36 PM   Specimen: Nasopharyngeal Swab  Result Value Ref Range Status   SARS Coronavirus 2 NEGATIVE NEGATIVE Final    Comment: (NOTE) SARS-CoV-2 target nucleic acids are NOT DETECTED.  The SARS-CoV-2 RNA is generally detectable in upper and lower respiratory specimens during the acute phase of infection. Negative results do not preclude SARS-CoV-2 infection, do not rule out co-infections with other pathogens, and should not be used as the sole basis for treatment or other patient management decisions. Negative results must be combined with clinical observations, patient history, and epidemiological information. The expected result is Negative.  Fact Sheet for Patients: SugarRoll.be  Fact Sheet for Healthcare Providers: https://www.woods-mathews.com/  This test is not yet approved or cleared by the [place] FDA and  has been authorized for detection and/or diagnosis of SARS-CoV-2 by FDA under an Emergency Use Authorization (EUA). This EUA will remain  in effect (meaning this test can be used) for the duration of  the COVID-19 declaration under Se ction 564(b)(1) of the Act, 21 U.S.C. section 360bbb-3(b)(1), unless the authorization is terminated or revoked sooner.  Performed at [facility], [place] [address] [place], [place] [address]           Radiology Studies: No results found.      Scheduled Meds:  apixaban  2.5 mg Oral BID   atorvastatin  40 mg Oral Daily   diltiazem  120 mg Oral Daily   ezetimibe  10 mg Oral Daily   metoprolol tartrate  100 mg Oral BID   multivitamin with minerals  1 tablet Oral Daily   sodium bicarbonate  650 mg Oral TID   Continuous Infusions:  cefTRIAXone (ROCEPHIN)  IV 200 mL/hr at 03/26/21 0507     LOS: 8 days    Time spent: 35 minutes    Leslee Home, MD [facility]   To contact the attending provider between 7A-7P or the covering provider during after hours 7P-7A, please log into the web site [email or web address] and access using universal [place] password for that web site. If you do not have the password, please call the hospital operator.  03/26/2021, 4:24 PM

## 2021-03-27 LAB — BASIC METABOLIC PANEL
Anion gap: 14 (ref 5–15)
BUN: 35 mg/dL — ABNORMAL HIGH (ref 8–23)
CO2: 25 mmol/L (ref 22–32)
Calcium: 9.3 mg/dL (ref 8.9–10.3)
Chloride: 99 mmol/L (ref 98–111)
Creatinine, Ser: 2.2 mg/dL — ABNORMAL HIGH (ref 0.44–1.00)
GFR, Estimated: 20 mL/min — ABNORMAL LOW (ref >60)
Glucose, Bld: 112 mg/dL — ABNORMAL HIGH (ref 70–99)
Potassium: 3.9 mmol/L (ref 3.5–5.1)
Sodium: 138 mmol/L (ref 135–145)

## 2021-03-27 LAB — CBC
HCT: 32 % — ABNORMAL LOW (ref 36.0–46.0)
Hemoglobin: 10.1 g/dL — ABNORMAL LOW (ref 12.0–15.0)
MCH: 28 pg (ref 26.0–34.0)
MCHC: 31.6 g/dL (ref 30.0–36.0)
MCV: 88.6 fL (ref 80.0–100.0)
Platelets: 350 10*3/uL (ref 150–400)
RBC: 3.61 MIL/uL — ABNORMAL LOW (ref 3.87–5.11)
RDW: 16 % — ABNORMAL HIGH (ref 11.5–15.5)
WBC: 14.4 10*3/uL — ABNORMAL HIGH (ref 4.0–10.5)
nRBC: 0 % (ref 0.0–0.2)

## 2021-03-27 LAB — PATHOLOGIST SMEAR REVIEW

## 2021-03-27 MED ORDER — SODIUM BICARBONATE 650 MG PO TABS
650.0000 mg | ORAL_TABLET | Freq: Every day | ORAL | Status: DC
  Administered 2021-03-28 – 2021-03-30 (×3): 650 mg via ORAL
  Filled 2021-03-27 (×3): qty 1

## 2021-03-27 MED ORDER — SODIUM CHLORIDE 0.9 % IV SOLN
2.0000 g | INTRAVENOUS | Status: AC
  Administered 2021-03-27 – 2021-03-28 (×2): 2 g via INTRAVENOUS
  Filled 2021-03-27: qty 20
  Filled 2021-03-27: qty 2

## 2021-03-27 NOTE — Plan of Care (Signed)

## 2021-03-27 NOTE — TOC Progression Note (Signed)
Transition of Care [facility]) - Progression Note    Patient Details  Name: Malaisha Knopik MRN: [id number] Date of Birth: December 31, 1924  Transition of [facility] CM/SW Contact  Reece Agar, [place] Phone Number: 03/27/2021, 12:28 PM  Clinical Narrative:    1105: CSW spoke with family member Pamala Hurry who wanted to confirm appeal information and to ask for CSW to fax pt clinicals to insurance for appeal. Pamala Hurry will complete form appointment of representative form for insurance to obtain information for Pt.   Expected Discharge Plan: [place] Barriers to Discharge: Continued Medical Work up, SNF Pending bed offer, Ship broker  Expected Discharge Plan and Services Expected Discharge Plan: [place] In-house Referral: Clinical Social Work Discharge Planning Services: CM Consult Post Acute Care Choice: [place] Living arrangements for the past 2 months: [place] Expected Discharge Date: 03/26/21                         HH Arranged: RN, PT, OT, Nurse's Aide           Social Determinants of Health (SDOH) Interventions    Readmission Risk Interventions No flowsheet data found.

## 2021-03-27 NOTE — Progress Notes (Signed)
PROGRESS NOTE    Heather Saunders  [id number] DOB: Dec 06, 1924 DOA: 03/17/2021 PCP: Kathyrn Lass, MD   Brief Narrative: 87 year old with past medical history significant for hypertension, CKD stage IV who was admitted with A. fib RVR, acute diastolic heart failure and AKI on CKD stage IV.  She was found to have right atrial thrombus.  She was a started on Eliquis.  She had 1 out of 4 blood cultures positive for staph epi.  Plan to repeat blood cultures today.  She has been getting ceftriaxone, for treatment of community-acquired pneumonia.   Assessment & Plan:   Principal Problem:   Atrial fibrillation with RVR (HCC) Active Problems:   Essential hypertension   Mixed hyperlipidemia   AKI (acute kidney injury) ([place])   Acute kidney injury ([place])   Pulmonary HTN (HCC)   Right atrial mass   Acute diastolic CHF (congestive heart failure) (HCC)  1-A. fib with RVR new onset, acute diastolic heart failure/pulmonary hypertension: -She was initially treated with Cardizem drip.  Cardiology was consulted. -Echo showed left ventricular ejection fraction 60 to 65% with severe pulmonary hypertension, concern for possible thrombus or myxoma in the right atrium. -Plan is for rate control and continue with Eliquis. -Received IV Lasix during this hospitalization for pulmonary edema.  Monitor volume status  2-Right atrial thrombus: -Transthoracic echo showed mobile filamentous structure in the right atrium concerning for possible thrombus or myxoma. -TEE showed mobile filamentous mass measuring 1.1 cm, about 0.6 cm in the right atrium which appeared to be attached to the eustachian valve and extending into the intra-atrial septum.  -1 out of 4 blood cultures positive for staph epi.  Likely contaminant.  We will repeat blood cultures -Continue apixaban  3-AKI on CKD stage IV with metabolic acidosis: Creatinine range 1.6--- 1.9 -Creatinine 2.4.  This might be her new baseline per nephrology. Continue  with sodium bicarb, reduced to her daily dose  4-CAP: Completed Zithromax. Continue with ceftriaxone plan for 5 days  5-hyperlipidemia, hypertension: Continue with atorvastatin and metoprolol.  Estimated body mass index is 20.25 kg/m as calculated from the following:   Height as of this encounter: '5\' 4"'$  (1.626 m).   Weight as of this encounter: 53.5 kg.   DVT prophylaxis: Eliquis Code Status: Full code Family Communication: care discussed with patient.  Disposition Plan:  Status is: Inpatient  Remains inpatient appropriate because:Unsafe d/c plan  Dispo:  Patient From: Home  Planned Disposition: [place]  Medically stable for discharge: No          Consultants:  Cardiology   Procedures:  ECHO; Impressions: 1. Left ventricular ejection fraction, by estimation, is 60 to 65%. The  left ventricle has normal function. The left ventricle has no regional  wall motion abnormalities. There is severe left ventricular hypertrophy.  Left ventricular diastolic function  could not be evaluated.   2. Right ventricular systolic function is normal. The right ventricular  size is normal. There is severely elevated pulmonary artery systolic  pressure. The estimated right ventricular systolic pressure is [id number].   3. Left atrial size was moderately dilated.   4. Mobile filamentous structure, which appears attached to the  interatrial septum - consider thrombus or possible myxoma (image 67/68) -  measuring ~0.6 x 2.0 cm. Right atrial size was mildly dilated.   5. The mitral valve is abnormal. Mild mitral valve regurgitation.   6. The aortic valve is tricuspid. Aortic valve regurgitation is mild.  Mild aortic valve sclerosis is present, with  no evidence of aortic valve  stenosis.   7. The inferior vena cava is dilated in size with >50% respiratory  variability, suggesting right atrial pressure of 8 mmHg.   Comparison(s): Changes from prior study are noted.  10/29/2020: LVEF 60-65%,  mobile RA mass can be seen but not noted in this study. _______________  Antimicrobials:  Ceftriaxone.   Subjective: She is feeling well, denies dyspnea. Denies chest pain   Objective: Vitals:   03/27/21 0300 03/27/21 0500 03/27/21 0722 03/27/21 1115  BP:   (!) 165/98 (!) 160/87  Pulse: 86 85 88 89  Resp: 17 (!) '21 20 19  '$ Temp:   98 F (36.7 C) 98 F (36.7 C)  TempSrc:   Oral Oral  SpO2: 90% 94% 92% 93%  Weight:  53.5 kg    Height:        Intake/Output Summary (Last 24 hours) at 03/27/2021 1423 Last data filed at 03/27/2021 1153 Gross per 24 hour  Intake 820 ml  Output --  Net 820 ml   Filed Weights   03/24/21 0338 03/26/21 0401 03/27/21 0500  Weight: 53.6 kg 53.7 kg 53.5 kg    Examination:  General exam: Appears calm and comfortable  Respiratory system: Clear to auscultation. Respiratory effort normal. Cardiovascular system: S1 & S2 heard, IRR Gastrointestinal system: Abdomen is nondistended, soft and nontender. No organomegaly or masses felt. Normal bowel sounds heard. Central nervous system: Alert and oriented.  Extremities: Symmetric 5 x 5 power.    Data Reviewed: I have personally reviewed following labs and imaging studies  CBC: Recent Labs  Lab 03/22/21 0027 03/23/21 0101 03/25/21 0047 03/26/21 0041 03/27/21 0726  WBC 17.9* 15.0* 12.9* 14.5* 14.4*  NEUTROABS 9.7* 7.5  --   --   --   HGB 10.0* 9.3* 9.3* 9.7* 10.1*  HCT 31.3* 29.3* 28.7* 30.1* 32.0*  MCV 87.4 87.7 87.2 88.3 88.6  PLT 279 285 282 288 [id number]   Basic Metabolic Panel: Recent Labs  Lab 03/22/21 0027 03/23/21 0101 03/25/21 0047 03/26/21 0041 03/27/21 0726  NA 136 138 138 136 138  K 4.4 4.5 4.0 4.2 3.9  CL 110 106 105 103 99  CO2 18* 21* '26 24 25  '$ GLUCOSE 133* 123* 114* 123* 112*  BUN 60* 54* 47* 45* 35*  CREATININE 2.75* 2.32* 2.49* 2.40* 2.20*  CALCIUM 8.7* 9.2 9.1 9.1 9.3   GFR: Estimated Creatinine Clearance: 13.2 mL/min (A) (by C-G formula  based on SCr of 2.2 mg/dL (H)). Liver Function Tests: No results for input(s): AST, ALT, ALKPHOS, BILITOT, PROT, ALBUMIN in the last 168 hours. No results for input(s): LIPASE, AMYLASE in the last 168 hours. No results for input(s): AMMONIA in the last 168 hours. Coagulation Profile: No results for input(s): INR, PROTIME in the last 168 hours. Cardiac Enzymes: No results for input(s): CKTOTAL, CKMB, CKMBINDEX, TROPONINI in the last 168 hours. BNP (last 3 results) No results for input(s): PROBNP in the last 8760 hours. HbA1C: No results for input(s): HGBA1C in the last 72 hours. CBG: No results for input(s): GLUCAP in the last 168 hours. Lipid Profile: No results for input(s): CHOL, HDL, LDLCALC, TRIG, CHOLHDL, LDLDIRECT in the last 72 hours. Thyroid Function Tests: No results for input(s): TSH, T4TOTAL, FREET4, T3FREE, THYROIDAB in the last 72 hours. Anemia Panel: No results for input(s): VITAMINB12, FOLATE, FERRITIN, TIBC, IRON, RETICCTPCT in the last 72 hours. Sepsis Labs: No results for input(s): PROCALCITON, LATICACIDVEN in the last 168 hours.  Recent Results (from the past  240 hour(s))  Resp Panel by RT-PCR (Flu A&B, Covid) Nasopharyngeal Swab     Status: None   Collection Time: 03/17/21 11:33 PM   Specimen: Nasopharyngeal Swab; Nasopharyngeal(NP) swabs in vial transport medium  Result Value Ref Range Status   SARS Coronavirus 2 by RT PCR NEGATIVE NEGATIVE Final    Comment: (NOTE) SARS-CoV-2 target nucleic acids are NOT DETECTED.  The SARS-CoV-2 RNA is generally detectable in upper respiratory specimens during the acute phase of infection. The lowest concentration of SARS-CoV-2 viral copies this assay can detect is 138 copies/mL. A negative result does not preclude SARS-Cov-2 infection and should not be used as the sole basis for treatment or other patient management decisions. A negative result may occur with  improper specimen collection/handling, submission of specimen  other than nasopharyngeal swab, presence of viral mutation(s) within the areas targeted by this assay, and inadequate number of viral copies(<138 copies/mL). A negative result must be combined with clinical observations, patient history, and epidemiological information. The expected result is Negative.  Fact Sheet for Patients:  EntrepreneurPulse.com.au  Fact Sheet for Healthcare Providers:  IncredibleEmployment.be  This test is no t yet approved or cleared by the [place] FDA and  has been authorized for detection and/or diagnosis of SARS-CoV-2 by FDA under an Emergency Use Authorization (EUA). This EUA will remain  in effect (meaning this test can be used) for the duration of the COVID-19 declaration under Section 564(b)(1) of the Act, 21 U.S.C.section 360bbb-3(b)(1), unless the authorization is terminated  or revoked sooner.       Influenza A by PCR NEGATIVE NEGATIVE Final   Influenza B by PCR NEGATIVE NEGATIVE Final    Comment: (NOTE) The Xpert Xpress SARS-CoV-2/FLU/RSV plus assay is intended as an aid in the diagnosis of influenza from Nasopharyngeal swab specimens and should not be used as a sole basis for treatment. Nasal washings and aspirates are unacceptable for Xpert Xpress SARS-CoV-2/FLU/RSV testing.  Fact Sheet for Patients: EntrepreneurPulse.com.au  Fact Sheet for Healthcare Providers: IncredibleEmployment.be  This test is not yet approved or cleared by the [place] FDA and has been authorized for detection and/or diagnosis of SARS-CoV-2 by FDA under an Emergency Use Authorization (EUA). This EUA will remain in effect (meaning this test can be used) for the duration of the COVID-19 declaration under Section 564(b)(1) of the Act, 21 U.S.C. section 360bbb-3(b)(1), unless the authorization is terminated or revoked.  Performed at [facility], [place] [address], [place],  [place] [address]   Culture, blood (routine x 2)     Status: None   Collection Time: 03/20/21 10:42 AM   Specimen: BLOOD  Result Value Ref Range Status   Specimen Description BLOOD RIGHT ANTECUBITAL  Final   Special Requests   Final    BOTTLES DRAWN AEROBIC AND ANAEROBIC Blood Culture adequate volume   Culture   Final    NO GROWTH 5 DAYS Performed at [facility], [place] [address] [place], [place] [address]    Report Status 03/25/2021 FINAL  Final  Culture, blood (routine x 2)     Status: Abnormal (Preliminary result)   Collection Time: 03/20/21 10:42 AM   Specimen: BLOOD RIGHT ARM  Result Value Ref Range Status   Specimen Description BLOOD RIGHT ARM  Final   Special Requests   Final    BOTTLES DRAWN AEROBIC AND ANAEROBIC Blood Culture adequate volume   Culture  Setup Time   Final    GRAM POSITIVE COCCI AEROBIC BOTTLE ONLY CRITICAL  RESULT CALLED TO, READ BACK BY AND VERIFIED WITH: PHARMD J.FRENS AT 0813 ON 03/21/2021 BY T.SAAD.    Culture (A)  Final    STAPHYLOCOCCUS EPIDERMIDIS CULTURE REINCUBATED FOR BETTER GROWTH Performed at [facility], [place] [address] [place], [place] [address]    Report Status PENDING  Incomplete  Blood Culture ID Panel (Reflexed)     Status: Abnormal   Collection Time: 03/20/21 10:42 AM  Result Value Ref Range Status   Enterococcus faecalis NOT DETECTED NOT DETECTED Final   Enterococcus Faecium NOT DETECTED NOT DETECTED Final   Listeria monocytogenes NOT DETECTED NOT DETECTED Final   Staphylococcus species DETECTED (A) NOT DETECTED Final    Comment: CRITICAL RESULT CALLED TO, READ BACK BY AND VERIFIED WITH: PHARMD J.FRENS AT [id number] ON 03/21/2021 BY .T.SAAD.    Staphylococcus aureus (BCID) NOT DETECTED NOT DETECTED Final   Staphylococcus epidermidis DETECTED (A) NOT DETECTED Final    Comment: CRITICAL RESULT CALLED TO, READ BACK BY AND VERIFIED WITH: PHARMD J.FRENS AT [id number] ON 03/21/2021 BY .T.SAAD.    Staphylococcus lugdunensis NOT DETECTED NOT  DETECTED Final   Streptococcus species NOT DETECTED NOT DETECTED Final   Streptococcus agalactiae NOT DETECTED NOT DETECTED Final   Streptococcus pneumoniae NOT DETECTED NOT DETECTED Final   Streptococcus pyogenes NOT DETECTED NOT DETECTED Final   A.calcoaceticus-baumannii NOT DETECTED NOT DETECTED Final   Bacteroides fragilis NOT DETECTED NOT DETECTED Final   Enterobacterales NOT DETECTED NOT DETECTED Final   Enterobacter cloacae complex NOT DETECTED NOT DETECTED Final   Escherichia coli NOT DETECTED NOT DETECTED Final   Klebsiella aerogenes NOT DETECTED NOT DETECTED Final   Klebsiella oxytoca NOT DETECTED NOT DETECTED Final   Klebsiella pneumoniae NOT DETECTED NOT DETECTED Final   Proteus species NOT DETECTED NOT DETECTED Final   Salmonella species NOT DETECTED NOT DETECTED Final   Serratia marcescens NOT DETECTED NOT DETECTED Final   Haemophilus influenzae NOT DETECTED NOT DETECTED Final   Neisseria meningitidis NOT DETECTED NOT DETECTED Final   Pseudomonas aeruginosa NOT DETECTED NOT DETECTED Final   Stenotrophomonas maltophilia NOT DETECTED NOT DETECTED Final   Candida albicans NOT DETECTED NOT DETECTED Final   Candida auris NOT DETECTED NOT DETECTED Final   Candida glabrata NOT DETECTED NOT DETECTED Final   Candida krusei NOT DETECTED NOT DETECTED Final   Candida parapsilosis NOT DETECTED NOT DETECTED Final   Candida tropicalis NOT DETECTED NOT DETECTED Final   Cryptococcus neoformans/gattii NOT DETECTED NOT DETECTED Final   Methicillin resistance mecA/C NOT DETECTED NOT DETECTED Final    Comment: Performed at [facility], [address], [place], [place] [address]  SARS CORONAVIRUS 2 (TAT 6-24 HRS) Nasopharyngeal Nasopharyngeal Swab     Status: None   Collection Time: 03/22/21  2:36 PM   Specimen: Nasopharyngeal Swab  Result Value Ref Range Status   SARS Coronavirus 2 NEGATIVE NEGATIVE Final    Comment: (NOTE) SARS-CoV-2 target nucleic acids are NOT DETECTED.  The  SARS-CoV-2 RNA is generally detectable in upper and lower respiratory specimens during the acute phase of infection. Negative results do not preclude SARS-CoV-2 infection, do not rule out co-infections with other pathogens, and should not be used as the sole basis for treatment or other patient management decisions. Negative results must be combined with clinical observations, patient history, and epidemiological information. The expected result is Negative.  Fact Sheet for Patients: SugarRoll.be  Fact Sheet for Healthcare Providers: https://www.woods-mathews.com/  This test is not yet approved or cleared by the [place]  States FDA and  has been authorized for detection and/or diagnosis of SARS-CoV-2 by FDA under an Emergency Use Authorization (EUA). This EUA will remain  in effect (meaning this test can be used) for the duration of the COVID-19 declaration under Se ction 564(b)(1) of the Act, 21 U.S.C. section 360bbb-3(b)(1), unless the authorization is terminated or revoked sooner.  Performed at [facility], [place] [address] [place], [place] [address]          Radiology Studies: No results found.      Scheduled Meds:  apixaban  2.5 mg Oral BID   atorvastatin  40 mg Oral Daily   diltiazem  120 mg Oral Daily   ezetimibe  10 mg Oral Daily   metoprolol tartrate  100 mg Oral BID   multivitamin with minerals  1 tablet Oral Daily   sodium bicarbonate  650 mg Oral TID   Continuous Infusions:  cefTRIAXone (ROCEPHIN)  IV 2 g (03/27/21 0850)     LOS: 9 days    Time spent: 35 minutes    Mesha Schamberger A Tony Friscia, MD [facility]   If 7PM-7AM, please contact night-coverage [email or web address]  03/27/2021, 2:23 PM

## 2021-03-27 NOTE — Progress Notes (Signed)
Occupational Therapy Treatment Patient Details Name: Heather Saunders MRN: [id number] DOB: [birth year] Today's Date: 03/27/2021    History of present illness Pt is a 87 y/o female presenting on 6/18 to ED for sudden onset of throat discomfort and concern for possible anginal equivalent. Pt found to have AKI on CKD . Pt with new onset a fib with RVR with pulmonary edema. On 6/21, pt had TEE with RA thrombus noted. PMH: coronary artery disease, essential hypertension, hyperlipidemia.   OT comments  Pt. Was seen for self care retaining. Pt. Was able to preform ADLs at S level. Pt. Is Min guard assist for mobility and transfer to commode. Pt. Was cooperative and does want to go to SNF for rehab.   Follow Up Recommendations  SNF;Supervision - Intermittent    Equipment Recommendations  3 in 1 bedside commode;Other (comment)    Recommendations for Other Services      Precautions / Restrictions Precautions Precautions: Fall;Other (comment) Precaution Comments: watch HR Restrictions Weight Bearing Restrictions: No       Mobility Bed Mobility Overal bed mobility: Needs Assistance Bed Mobility: Supine to Sit     Supine to sit: Supervision;HOB elevated          Transfers Overall transfer level: Needs assistance   Transfers: Sit to/from Stand Sit to Stand: Supervision Stand pivot transfers: Min guard            Balance Overall balance assessment: Needs assistance Sitting-balance support: Feet supported Sitting balance-Leahy Scale: Good       Standing balance-Leahy Scale: Fair                             ADL either performed or assessed with clinical judgement   ADL Overall ADL's : Needs assistance/impaired     Grooming: Wash/dry face;Wash/dry hands;Supervision/safety;Standing               Lower Body Dressing: Min guard;Sit to/from stand   Toilet Transfer: Min guard;Ambulation;RW   Toileting- Water quality scientist and Hygiene: Min guard;Sit  to/from stand       Functional mobility during ADLs: Min guard;Rolling walker;Cueing for sequencing;Cueing for safety General ADL Comments: Pt. preformed LE dressing using cross leg technique     Vision   Vision Assessment?: No apparent visual deficits   Perception     Praxis      Cognition Arousal/Alertness: Awake/alert Behavior During Therapy: WFL for tasks assessed/performed Overall Cognitive Status: Within Functional Limits for tasks assessed                                          Exercises     Shoulder Instructions       General Comments hr 96-98    Pertinent Vitals/ Pain       Pain Assessment: No/denies pain  Home Living                                          Prior Functioning/Environment              Frequency  Min 2X/week        Progress Toward Goals  OT Goals(current goals can now be found in the care plan section)  Progress towards OT goals: Progressing toward goals  Acute  Rehab OT Goals Patient Stated Goal: go home OT Goal Formulation: With patient Time For Goal Achievement: 04/02/21 Potential to Achieve Goals: Good ADL Goals Pt Will Perform Grooming: with modified independence;standing Pt Will Perform Lower Body Bathing: with modified independence;sitting/lateral leans;sit to/from stand Pt Will Perform Lower Body Dressing: with modified independence;sit to/from stand;sitting/lateral leans Pt Will Transfer to Toilet: with modified independence;ambulating Pt Will Perform Tub/Shower Transfer: with set-up;Tub transfer;ambulating;rolling walker Pt/caregiver will Perform Home Exercise Program: Increased strength;Both right and left upper extremity;With theraband;With written HEP provided;With Supervision  Plan Discharge plan remains appropriate    Co-evaluation                 AM-PAC OT "6 Clicks" Daily Activity     Outcome Measure   Help from another person eating meals?: None Help from  another person taking care of personal grooming?: A Little Help from another person toileting, which includes using toliet, bedpan, or urinal?: A Little Help from another person bathing (including washing, rinsing, drying)?: A Little Help from another person to put on and taking off regular upper body clothing?: A Little Help from another person to put on and taking off regular lower body clothing?: A Little 6 Click Score: 19    End of Session Equipment Utilized During Treatment: Rolling walker  OT Visit Diagnosis: Unsteadiness on feet (R26.81);Other abnormalities of gait and mobility (R26.89);Muscle weakness (generalized) (M62.81)   Activity Tolerance Patient tolerated treatment well   Patient Left in chair;with call bell/phone within reach   Nurse Communication  (ok therapy)        Time: [id number] OT Time Calculation (min): 40 min  Charges: OT General Charges $OT Visit: 1 Visit OT Treatments $Self Care/Home Management : 23-37 mins $Therapeutic Activity: 8-22 mins  Reece Packer OT/L    Tallie Dodds 03/27/2021, 10:44 AM

## 2021-03-28 MED ORDER — BIOTENE DRY MOUTH MT LIQD: 15.0000 mL | OROMUCOSAL | Status: DC | PRN

## 2021-03-28 NOTE — Plan of Care (Signed)

## 2021-03-28 NOTE — Progress Notes (Signed)
Physical Therapy Treatment Patient Details Name: Heather Saunders MRN: [id number] DOB: 1925-07-11 Today's Date: 03/28/2021    History of Present Illness Pt is a 87 y/o female presenting on 6/18 to ED for sudden onset of throat discomfort and concern for possible anginal equivalent. Pt found to have AKI on CKD . Pt with new onset a fib with RVR with pulmonary edema. On 6/21, pt had TEE with RA thrombus noted. Pt deemed medically stable for d/c but pt was denied SNF by insurance and family is appealing, so pt remains hospitalized. PMH: coronary artery disease, essential hypertension, hyperlipidemia.    PT Comments    Pt making gradual progress.  Session focused on safety with ambulation and transfers.  Also, had pt perform task in room to simulate home environment - she did need min A at times for balance to prevent fall. Pt lives alone and is fall risk with ADLs, IADLs, and ambulation.  Family is appealing SNF denial by insurance.  If insurance still denies SNF, pt will need max HH services and increased support from friends/family.    Follow Up Recommendations  Other (comment) (SNF due to lives alone/fall risk.  If insurance continues to deny SNF will need max HH services and supervision for mobility.)     Equipment Recommendations  Other (comment) (rollator)    Recommendations for Other Services       Precautions / Restrictions Precautions Precautions: Fall    Mobility  Bed Mobility Overal bed mobility: Needs Assistance Bed Mobility: Supine to Sit     Supine to sit: Supervision          Transfers Overall transfer level: Needs assistance Equipment used: 4-wheeled walker;None Transfers: Sit to/from Stand Sit to Stand: Supervision         General transfer comment: Performed sit to stand x 3 during session, additionally performed x 10 for exercise; cued for safe hand placement and locking rollator.  Ambulation/Gait Ambulation/Gait assistance: Supervision;Min  guard Gait Distance (Feet): 250 Feet (250' with rollator then 53' in room without AD) Assistive device: 4-wheeled walker;None Gait Pattern/deviations: Step-through pattern;Decreased stride length Gait velocity: reduced   General Gait Details: Ambulated 250' with rollator and close supervision with cues for rollator.  Then had pt ambulate in room - she wanted to try without RW.  Did 57' with min guard and pt reaching for bed or sink in tight areas.   Stairs             Wheelchair Mobility    Modified Rankin (Stroke Patients Only)       Balance Overall balance assessment: Needs assistance Sitting-balance support: Feet supported Sitting balance-Leahy Scale: Good     Standing balance support: No upper extremity supported;Single extremity supported;Bilateral upper extremity supported Standing balance-Leahy Scale: Good Standing balance comment: Ambulated in hallway with rollator, performed ADLs at sink and ambulated short distance without RW but occasionally reaching for sink, bed, wall and needed min guard               High Level Balance Comments: Had pt ambulate in room to simulate home environment.  Had her ambulate around corners, tight spaces, carried her iPad from bed to table, looked through nightstand drawers including bottom drawer but needing min A with this for balance, and reached for gloves over head again min A with this task; all other task min guard.            Cognition Arousal/Alertness: Awake/alert Behavior During Therapy: WFL for tasks assessed/performed Overall Cognitive  Status: Within Functional Limits for tasks assessed                                 General Comments: Pt pleasant , A and O x 4, following all commands.  Does need repeated cues on how to use rollator      Exercises      General Comments General comments (skin integrity, edema, etc.): HR 107 bpm max.  Encouraged pt to consider options if insurance denies SNF  again.  She reports she is considering Insurance risk surveyor and has some friends that can assist intermittently.      Pertinent Vitals/Pain Pain Assessment: No/denies pain    Home Living                      Prior Function            PT Goals (current goals can now be found in the care plan section) Acute Rehab PT Goals Patient Stated Goal: rehab then home PT Goal Formulation: With patient Time For Goal Achievement: 04/01/21 Potential to Achieve Goals: Good Progress towards PT goals: Progressing toward goals    Frequency    Min 3X/week      PT Plan Discharge plan needs to be updated    Co-evaluation              AM-PAC PT "6 Clicks" Mobility   Outcome Measure  Help needed turning from your back to your side while in a flat bed without using bedrails?: None Help needed moving from lying on your back to sitting on the side of a flat bed without using bedrails?: None Help needed moving to and from a bed to a chair (including a wheelchair)?: A Little Help needed standing up from a chair using your arms (e.g., wheelchair or bedside chair)?: A Little Help needed to walk in hospital room?: A Little Help needed climbing 3-5 steps with a railing? : A Little 6 Click Score: 20    End of Session Equipment Utilized During Treatment: Gait belt Activity Tolerance: Patient tolerated treatment well Patient left: in chair;with call bell/phone within reach Nurse Communication: Mobility status PT Visit Diagnosis: Unsteadiness on feet (R26.81);Other abnormalities of gait and mobility (R26.89);Muscle weakness (generalized) (M62.81);Difficulty in walking, not elsewhere classified (R26.2)     Time: [id number] PT Time Calculation (min) (ACUTE ONLY): 28 min  Charges:  $Gait Training: 8-22 mins $Therapeutic Exercise: 8-22 mins                     Abran Richard, PT Acute Rehab Services Pager [phone] [facility] [place] 03/28/2021, 1:16 PM

## 2021-03-28 NOTE — TOC Progression Note (Signed)
Transition of Care [facility]) - Progression Note    Patient Details  Name: Heather Saunders MRN: [id number] Date of Birth: 1925/06/14  Transition of Care [facility]) CM/SW Contact  Reece Agar, [place] Phone Number: 03/28/2021, 4:11 PM  Clinical Narrative:    [id number]: Pt signed the appointment of representation form for insurance and is waiting for decision to come back. Pt explained that she was ready to DC and has many friends and family members that could help out at home if she has to DC home but would prefer SNF.   Expected Discharge Plan: [place] Barriers to Discharge: Continued Medical Work up, SNF Pending bed offer, Ship broker  Expected Discharge Plan and Services Expected Discharge Plan: [place] In-house Referral: Clinical Social Work Discharge Planning Services: CM Consult Post Acute Care Choice: [place] Living arrangements for the past 2 months: [place] Expected Discharge Date: 03/26/21                         HH Arranged: RN, PT, OT, Nurse's Aide           Social Determinants of Health (SDOH) Interventions    Readmission Risk Interventions No flowsheet data found.

## 2021-03-28 NOTE — Progress Notes (Signed)
PROGRESS NOTE    Heather Saunders  [id number] DOB: 1925-07-22 DOA: 03/17/2021 PCP: Kathyrn Lass, MD   Brief Narrative: 87 year old with past medical history significant for hypertension, CKD stage IV who was admitted with A. fib RVR, acute diastolic heart failure and AKI on CKD stage IV.  She was found to have right atrial thrombus.  She was a started on Eliquis.  She had 1 out of 4 blood cultures positive for staph epi.  Plan to repeat blood cultures today.  She has been getting ceftriaxone, for treatment of community-acquired pneumonia.   Assessment & Plan:   Principal Problem:   Atrial fibrillation with RVR (HCC) Active Problems:   Essential hypertension   Mixed hyperlipidemia   AKI (acute kidney injury) ([place])   Acute kidney injury ([place])   Pulmonary HTN (HCC)   Right atrial mass   Acute diastolic CHF (congestive heart failure) (HCC)  1-A. fib with RVR new onset, acute diastolic heart failure/pulmonary hypertension: -She was initially treated with Cardizem drip.  Cardiology was consulted. -Echo showed left ventricular ejection fraction 60 to 65% with severe pulmonary hypertension, concern for possible thrombus or myxoma in the right atrium. -Plan is for rate control and continue with Eliquis. -Received IV Lasix during this hospitalization for pulmonary edema.  Monitor volume status -Monitor weight; stable. Fluids restriction.   2-Right Atrial Thrombus: -Transthoracic echo showed mobile filamentous structure in the right atrium concerning for possible thrombus or myxoma. -TEE showed mobile filamentous mass measuring 1.1 cm, about 0.6 cm in the right atrium which appeared to be attached to the eustachian valve and extending into the intra-atrial septum.  -1 out of 4 blood cultures positive for staph epi.  Likely contaminant.  Repeated Blood culture 6/28; No growth to date.  -Continue apixaban.  3-AKI on CKD stage IV with metabolic acidosis: Creatinine range 1.6---  1.9 -Creatinine 2.4.  This might be her new baseline per nephrology. -Continue with sodium bicarb, reduced to her daily dose.  4-CAP: Completed Zithromax. Continue with ceftriaxone plan for 5 days  5-hyperlipidemia, hypertension: Continue with atorvastatin and metoprolol.  Estimated body mass index is 20.4 kg/m as calculated from the following:   Height as of this encounter: '5\' 4"'$  (1.626 m).   Weight as of this encounter: 53.9 kg.   DVT prophylaxis: Eliquis Code Status: Full code Family Communication: care discussed with patient. Family updated; nephew/ 6/29 Disposition Plan:  Status is: Inpatient  Remains inpatient appropriate because:Unsafe d/c plan  Dispo:  Patient From: Home  Planned Disposition: [place]  Medically stable for discharge: No          Consultants:  Cardiology   Procedures:  ECHO; Impressions: 1. Left ventricular ejection fraction, by estimation, is 60 to 65%. The  left ventricle has normal function. The left ventricle has no regional  wall motion abnormalities. There is severe left ventricular hypertrophy.  Left ventricular diastolic function  could not be evaluated.   2. Right ventricular systolic function is normal. The right ventricular  size is normal. There is severely elevated pulmonary artery systolic  pressure. The estimated right ventricular systolic pressure is [id number] mmHg.   3. Left atrial size was moderately dilated.   4. Mobile filamentous structure, which appears attached to the  interatrial septum - consider thrombus or possible myxoma (image 67/68) -  measuring ~0.6 x 2.0 cm. Right atrial size was mildly dilated.   5. The mitral valve is abnormal. Mild mitral valve regurgitation.   6. The aortic valve is tricuspid.  Aortic valve regurgitation is mild.  Mild aortic valve sclerosis is present, with no evidence of aortic valve  stenosis.   7. The inferior vena cava is dilated in size with >50% respiratory   variability, suggesting right atrial pressure of 8 mmHg.   Comparison(s): Changes from prior study are noted. 10/29/2020: LVEF 60-65%,  mobile RA mass can be seen but not noted in this study. _______________  Antimicrobials:  Ceftriaxone.   Subjective: No new complaints. Denies dyspnea.   Objective: Vitals:   03/28/21 0407 03/28/21 0432 03/28/21 0744 03/28/21 1116  BP: (!) 165/81  (!) 157/86 (!) 166/84  Pulse: 82  86 89  Resp: 20  20   Temp: 98.4 F (36.9 C)  98.4 F (36.9 C) 98 F (36.7 C)  TempSrc: Oral   Oral  SpO2: 93%  94% 94%  Weight:  53.9 kg    Height:        Intake/Output Summary (Last 24 hours) at 03/28/2021 1353 Last data filed at 03/27/2021 1920 Gross per 24 hour  Intake 460 ml  Output --  Net 460 ml    Filed Weights   03/26/21 0401 03/27/21 0500 03/28/21 0432  Weight: 53.7 kg 53.5 kg 53.9 kg    Examination:  General exam: NAD Respiratory system: CTA Cardiovascular system: S 1, S 2 IRR Gastrointestinal system: BS present, soft,  nt Central nervous system: Alert and oriented.  Extremities: no edema    Data Reviewed: I have personally reviewed following labs and imaging studies  CBC: Recent Labs  Lab 03/22/21 0027 03/23/21 0101 03/25/21 0047 03/26/21 0041 03/27/21 0726  WBC 17.9* 15.0* 12.9* 14.5* 14.4*  NEUTROABS 9.7* 7.5  --   --   --   HGB 10.0* 9.3* 9.3* 9.7* 10.1*  HCT 31.3* 29.3* 28.7* 30.1* 32.0*  MCV 87.4 87.7 87.2 88.3 88.6  PLT 279 285 282 288 [id number]    Basic Metabolic Panel: Recent Labs  Lab 03/22/21 0027 03/23/21 0101 03/25/21 0047 03/26/21 0041 03/27/21 0726  NA 136 138 138 136 138  K 4.4 4.5 4.0 4.2 3.9  CL 110 106 105 103 99  CO2 18* 21* '26 24 25  '$ GLUCOSE 133* 123* 114* 123* 112*  BUN 60* 54* 47* 45* 35*  CREATININE 2.75* 2.32* 2.49* 2.40* 2.20*  CALCIUM 8.7* 9.2 9.1 9.1 9.3    GFR: Estimated Creatinine Clearance: 13.3 mL/min (A) (by C-G formula based on SCr of 2.2 mg/dL (H)). Liver Function Tests: No  results for input(s): AST, ALT, ALKPHOS, BILITOT, PROT, ALBUMIN in the last 168 hours. No results for input(s): LIPASE, AMYLASE in the last 168 hours. No results for input(s): AMMONIA in the last 168 hours. Coagulation Profile: No results for input(s): INR, PROTIME in the last 168 hours. Cardiac Enzymes: No results for input(s): CKTOTAL, CKMB, CKMBINDEX, TROPONINI in the last 168 hours. BNP (last 3 results) No results for input(s): PROBNP in the last 8760 hours. HbA1C: No results for input(s): HGBA1C in the last 72 hours. CBG: No results for input(s): GLUCAP in the last 168 hours. Lipid Profile: No results for input(s): CHOL, HDL, LDLCALC, TRIG, CHOLHDL, LDLDIRECT in the last 72 hours. Thyroid Function Tests: No results for input(s): TSH, T4TOTAL, FREET4, T3FREE, THYROIDAB in the last 72 hours. Anemia Panel: No results for input(s): VITAMINB12, FOLATE, FERRITIN, TIBC, IRON, RETICCTPCT in the last 72 hours. Sepsis Labs: No results for input(s): PROCALCITON, LATICACIDVEN in the last 168 hours.  Recent Results (from the past 240 hour(s))  Culture, blood (routine x  2)     Status: None   Collection Time: 03/20/21 10:42 AM   Specimen: BLOOD  Result Value Ref Range Status   Specimen Description BLOOD RIGHT ANTECUBITAL  Final   Special Requests   Final    BOTTLES DRAWN AEROBIC AND ANAEROBIC Blood Culture adequate volume   Culture   Final    NO GROWTH 5 DAYS Performed at [facility], [address] [place], [place] [address]    Report Status 03/25/2021 FINAL  Final  Culture, blood (routine x 2)     Status: Abnormal (Preliminary result)   Collection Time: 03/20/21 10:42 AM   Specimen: BLOOD RIGHT ARM  Result Value Ref Range Status   Specimen Description BLOOD RIGHT ARM  Final   Special Requests   Final    BOTTLES DRAWN AEROBIC AND ANAEROBIC Blood Culture adequate volume   Culture  Setup Time   Final    GRAM POSITIVE COCCI AEROBIC BOTTLE ONLY CRITICAL RESULT CALLED TO, READ  BACK BY AND VERIFIED WITH: PHARMD J.FRENS AT 0813 ON 03/21/2021 BY T.SAAD.    Culture (A)  Final    STAPHYLOCOCCUS EPIDERMIDIS SUSCEPTIBILITIES TO FOLLOW Performed at [facility], [place] [address] [place], [place] [address]    Report Status PENDING  Incomplete  Blood Culture ID Panel (Reflexed)     Status: Abnormal   Collection Time: 03/20/21 10:42 AM  Result Value Ref Range Status   Enterococcus faecalis NOT DETECTED NOT DETECTED Final   Enterococcus Faecium NOT DETECTED NOT DETECTED Final   Listeria monocytogenes NOT DETECTED NOT DETECTED Final   Staphylococcus species DETECTED (A) NOT DETECTED Final    Comment: CRITICAL RESULT CALLED TO, READ BACK BY AND VERIFIED WITH: PHARMD J.FRENS AT [id number] ON 03/21/2021 BY .T.SAAD.    Staphylococcus aureus (BCID) NOT DETECTED NOT DETECTED Final   Staphylococcus epidermidis DETECTED (A) NOT DETECTED Final    Comment: CRITICAL RESULT CALLED TO, READ BACK BY AND VERIFIED WITH: PHARMD J.FRENS AT [id number] ON 03/21/2021 BY .T.SAAD.    Staphylococcus lugdunensis NOT DETECTED NOT DETECTED Final   Streptococcus species NOT DETECTED NOT DETECTED Final   Streptococcus agalactiae NOT DETECTED NOT DETECTED Final   Streptococcus pneumoniae NOT DETECTED NOT DETECTED Final   Streptococcus pyogenes NOT DETECTED NOT DETECTED Final   A.calcoaceticus-baumannii NOT DETECTED NOT DETECTED Final   Bacteroides fragilis NOT DETECTED NOT DETECTED Final   Enterobacterales NOT DETECTED NOT DETECTED Final   Enterobacter cloacae complex NOT DETECTED NOT DETECTED Final   Escherichia coli NOT DETECTED NOT DETECTED Final   Klebsiella aerogenes NOT DETECTED NOT DETECTED Final   Klebsiella oxytoca NOT DETECTED NOT DETECTED Final   Klebsiella pneumoniae NOT DETECTED NOT DETECTED Final   Proteus species NOT DETECTED NOT DETECTED Final   Salmonella species NOT DETECTED NOT DETECTED Final   Serratia marcescens NOT DETECTED NOT DETECTED Final   Haemophilus influenzae NOT  DETECTED NOT DETECTED Final   Neisseria meningitidis NOT DETECTED NOT DETECTED Final   Pseudomonas aeruginosa NOT DETECTED NOT DETECTED Final   Stenotrophomonas maltophilia NOT DETECTED NOT DETECTED Final   Candida albicans NOT DETECTED NOT DETECTED Final   Candida auris NOT DETECTED NOT DETECTED Final   Candida glabrata NOT DETECTED NOT DETECTED Final   Candida krusei NOT DETECTED NOT DETECTED Final   Candida parapsilosis NOT DETECTED NOT DETECTED Final   Candida tropicalis NOT DETECTED NOT DETECTED Final   Cryptococcus neoformans/gattii NOT DETECTED NOT DETECTED Final   Methicillin resistance mecA/C NOT DETECTED NOT DETECTED Final  Comment: Performed at [facility], [place] [address], [place], [place] [address]  SARS CORONAVIRUS 2 (TAT 6-24 HRS) Nasopharyngeal Nasopharyngeal Swab     Status: None   Collection Time: 03/22/21  2:36 PM   Specimen: Nasopharyngeal Swab  Result Value Ref Range Status   SARS Coronavirus 2 NEGATIVE NEGATIVE Final    Comment: (NOTE) SARS-CoV-2 target nucleic acids are NOT DETECTED.  The SARS-CoV-2 RNA is generally detectable in upper and lower respiratory specimens during the acute phase of infection. Negative results do not preclude SARS-CoV-2 infection, do not rule out co-infections with other pathogens, and should not be used as the sole basis for treatment or other patient management decisions. Negative results must be combined with clinical observations, patient history, and epidemiological information. The expected result is Negative.  Fact Sheet for Patients: SugarRoll.be  Fact Sheet for Healthcare Providers: https://www.woods-mathews.com/  This test is not yet approved or cleared by the [place] FDA and  has been authorized for detection and/or diagnosis of SARS-CoV-2 by FDA under an Emergency Use Authorization (EUA). This EUA will remain  in effect (meaning this test can be used) for the  duration of the COVID-19 declaration under Se ction 564(b)(1) of the Act, 21 U.S.C. section 360bbb-3(b)(1), unless the authorization is terminated or revoked sooner.  Performed at [facility], [place] [address] [place], [place] [address]   Culture, blood (routine x 2)     Status: None (Preliminary result)   Collection Time: 03/27/21  8:32 AM   Specimen: BLOOD RIGHT HAND  Result Value Ref Range Status   Specimen Description BLOOD RIGHT HAND  Final   Special Requests   Final    BOTTLES DRAWN AEROBIC AND ANAEROBIC Blood Culture adequate volume   Culture   Final    NO GROWTH < 24 HOURS Performed at [facility], [place] [address], [place], [place] [address]    Report Status PENDING  Incomplete  Culture, blood (routine x 2)     Status: None (Preliminary result)   Collection Time: 03/27/21  8:32 AM   Specimen: BLOOD LEFT HAND  Result Value Ref Range Status   Specimen Description BLOOD LEFT HAND  Final   Special Requests   Final    BOTTLES DRAWN AEROBIC AND ANAEROBIC Blood Culture adequate volume   Culture   Final    NO GROWTH < 24 HOURS Performed at [facility], [place] [address] [place], [place] [address]    Report Status PENDING  Incomplete          Radiology Studies: No results found.      Scheduled Meds:  apixaban  2.5 mg Oral BID   atorvastatin  40 mg Oral Daily   diltiazem  120 mg Oral Daily   ezetimibe  10 mg Oral Daily   metoprolol tartrate  100 mg Oral BID   multivitamin with minerals  1 tablet Oral Daily   sodium bicarbonate  650 mg Oral Daily   Continuous Infusions:     LOS: 10 days    Time spent: 35 minutes    Jerris Fleer A Terianna Peggs, MD [facility]   If 7PM-7AM, please contact night-coverage [email or web address]  03/28/2021, 1:53 PM

## 2021-03-29 LAB — CBC
HCT: 30.3 % — ABNORMAL LOW (ref 36.0–46.0)
Hemoglobin: 9.5 g/dL — ABNORMAL LOW (ref 12.0–15.0)
MCH: 27.8 pg (ref 26.0–34.0)
MCHC: 31.4 g/dL (ref 30.0–36.0)
MCV: 88.6 fL (ref 80.0–100.0)
Platelets: 328 10*3/uL (ref 150–400)
RBC: 3.42 MIL/uL — ABNORMAL LOW (ref 3.87–5.11)
RDW: 15.9 % — ABNORMAL HIGH (ref 11.5–15.5)
WBC: 13.8 10*3/uL — ABNORMAL HIGH (ref 4.0–10.5)
nRBC: 0 % (ref 0.0–0.2)

## 2021-03-29 LAB — CULTURE, BLOOD (ROUTINE X 2): Special Requests: ADEQUATE

## 2021-03-29 LAB — BASIC METABOLIC PANEL
Anion gap: 7 (ref 5–15)
BUN: 29 mg/dL — ABNORMAL HIGH (ref 8–23)
CO2: 27 mmol/L (ref 22–32)
Calcium: 8.9 mg/dL (ref 8.9–10.3)
Chloride: 103 mmol/L (ref 98–111)
Creatinine, Ser: 2.18 mg/dL — ABNORMAL HIGH (ref 0.44–1.00)
GFR, Estimated: 20 mL/min — ABNORMAL LOW (ref >60)
Glucose, Bld: 113 mg/dL — ABNORMAL HIGH (ref 70–99)
Potassium: 3.9 mmol/L (ref 3.5–5.1)
Sodium: 137 mmol/L (ref 135–145)

## 2021-03-29 MED ORDER — FUROSEMIDE 20 MG PO TABS
20.0000 mg | ORAL_TABLET | Freq: Every day | ORAL | Status: DC
  Administered 2021-03-29 – 2021-03-30 (×2): 20 mg via ORAL
  Filled 2021-03-29 (×2): qty 1

## 2021-03-29 NOTE — Progress Notes (Signed)
PROGRESS NOTE    Heather Saunders  [id number] DOB: Feb 27, 1925 DOA: 03/17/2021 PCP: Kathyrn Lass, MD   Brief Narrative: 87 year old with past medical history significant for hypertension, CKD stage IV who was admitted with A. fib RVR, acute diastolic heart failure and AKI on CKD stage IV.  She was found to have right atrial thrombus.  She was a started on Eliquis.  She had 1 out of 4 blood cultures positive for staph epi.  Plan to repeat blood cultures today.  She has been getting ceftriaxone, for treatment of community-acquired pneumonia.   Assessment & Plan:   Principal Problem:   Atrial fibrillation with RVR (HCC) Active Problems:   Essential hypertension   Mixed hyperlipidemia   AKI (acute kidney injury) ([place])   Acute kidney injury ([place])   Pulmonary HTN (HCC)   Right atrial mass   Acute diastolic CHF (congestive heart failure) (HCC)  1-A. fib with RVR new onset, acute diastolic heart failure/pulmonary hypertension: -She was initially treated with Cardizem drip.  Cardiology was consulted. -Echo showed left ventricular ejection fraction 60 to 65% with severe pulmonary hypertension, concern for possible thrombus or myxoma in the right atrium. -Plan is for rate control and continue with Eliquis. -Received IV Lasix during this hospitalization for pulmonary edema.  Monitor volume status -Monitor weight; 117--118--119. Fluids restriction.  -will start low dose lasix.   2-Right Atrial Thrombus: -Transthoracic echo showed mobile filamentous structure in the right atrium concerning for possible thrombus or myxoma. -TEE showed mobile filamentous mass measuring 1.1 cm, about 0.6 cm in the right atrium which appeared to be attached to the eustachian valve and extending into the intra-atrial septum.  -1 out of 4 blood cultures positive for staph epi.  Likely contaminant.  Repeated Blood culture 6/28; No growth to date.  -Continue apixaban.  3-AKI on CKD stage IV with metabolic acidosis:  Creatinine range 1.6--- 1.9 -Creatinine 2.4.  This might be her new baseline per nephrology. -Continue with sodium bicarb, reduced to her daily dose.  4-CAP: Completed Zithromax. Completed ceftriaxone  5 days  5-hyperlipidemia, hypertension: Continue with atorvastatin and metoprolol.  Estimated body mass index is 20.51 kg/m as calculated from the following:   Height as of this encounter: '5\' 4"'$  (1.626 m).   Weight as of this encounter: 54.2 kg.   DVT prophylaxis: Eliquis Code Status: Full code Family Communication: care discussed with patient. Family updated; nephew/ 6/29 Disposition Plan:  Status is: Inpatient  Remains inpatient appropriate because:Unsafe d/c plan  Dispo:  Patient From: Home  Planned Disposition: [place]  Medically stable for discharge: No          Consultants:  Cardiology   Procedures:  ECHO; Impressions: 1. Left ventricular ejection fraction, by estimation, is 60 to 65%. The  left ventricle has normal function. The left ventricle has no regional  wall motion abnormalities. There is severe left ventricular hypertrophy.  Left ventricular diastolic function  could not be evaluated.   2. Right ventricular systolic function is normal. The right ventricular  size is normal. There is severely elevated pulmonary artery systolic  pressure. The estimated right ventricular systolic pressure is [id number].   3. Left atrial size was moderately dilated.   4. Mobile filamentous structure, which appears attached to the  interatrial septum - consider thrombus or possible myxoma (image 67/68) -  measuring ~0.6 x 2.0 cm. Right atrial size was mildly dilated.   5. The mitral valve is abnormal. Mild mitral valve regurgitation.   6. The  aortic valve is tricuspid. Aortic valve regurgitation is mild.  Mild aortic valve sclerosis is present, with no evidence of aortic valve  stenosis.   7. The inferior vena cava is dilated in size with >50%  respiratory  variability, suggesting right atrial pressure of 8 mmHg.   Comparison(s): Changes from prior study are noted. 10/29/2020: LVEF 60-65%,  mobile RA mass can be seen but not noted in this study. _______________  Antimicrobials:  Ceftriaxone.   Subjective: No new complaints. Denies dyspnea. She denies dyspnea.  Awaiting response from insurance.  Objective: Vitals:   03/29/21 0300 03/29/21 0317 03/29/21 0500 03/29/21 1258  BP:  (!) 139/57  134/69  Pulse: 66 76  76  Resp: '17 16 19   '$ Temp:  98.5 F (36.9 C)  98.4 F (36.9 C)  TempSrc:  Oral  Oral  SpO2: 90% 91%    Weight:   54.2 kg   Height:       No intake or output data in the 24 hours ending 03/29/21 1355  Filed Weights   03/27/21 0500 03/28/21 0432 03/29/21 0500  Weight: 53.5 kg 53.9 kg 54.2 kg    Examination:  General exam: NAD Respiratory system: CTA Cardiovascular system: S 1, S 2 IRR Gastrointestinal system: BS present, soft, nt Central nervous system: Alert Extremities: trace edema    Data Reviewed: I have personally reviewed following labs and imaging studies  CBC: Recent Labs  Lab 03/23/21 0101 03/25/21 0047 03/26/21 0041 03/27/21 0726 03/29/21 0017  WBC 15.0* 12.9* 14.5* 14.4* 13.8*  NEUTROABS 7.5  --   --   --   --   HGB 9.3* 9.3* 9.7* 10.1* 9.5*  HCT 29.3* 28.7* 30.1* 32.0* 30.3*  MCV 87.7 87.2 88.3 88.6 88.6  PLT 285 282 288 350 [id number]    Basic Metabolic Panel: Recent Labs  Lab 03/23/21 0101 03/25/21 0047 03/26/21 0041 03/27/21 0726 03/29/21 0017  NA 138 138 136 138 137  K 4.5 4.0 4.2 3.9 3.9  CL 106 105 103 99 103  CO2 21* '26 24 25 27  '$ GLUCOSE 123* 114* 123* 112* 113*  BUN 54* 47* 45* 35* 29*  CREATININE 2.32* 2.49* 2.40* 2.20* 2.18*  CALCIUM 9.2 9.1 9.1 9.3 8.9    GFR: Estimated Creatinine Clearance: 13.5 mL/min (A) (by C-G formula based on SCr of 2.18 mg/dL (H)). Liver Function Tests: No results for input(s): AST, ALT, ALKPHOS, BILITOT, PROT, ALBUMIN in the last  168 hours. No results for input(s): LIPASE, AMYLASE in the last 168 hours. No results for input(s): AMMONIA in the last 168 hours. Coagulation Profile: No results for input(s): INR, PROTIME in the last 168 hours. Cardiac Enzymes: No results for input(s): CKTOTAL, CKMB, CKMBINDEX, TROPONINI in the last 168 hours. BNP (last 3 results) No results for input(s): PROBNP in the last 8760 hours. HbA1C: No results for input(s): HGBA1C in the last 72 hours. CBG: No results for input(s): GLUCAP in the last 168 hours. Lipid Profile: No results for input(s): CHOL, HDL, LDLCALC, TRIG, CHOLHDL, LDLDIRECT in the last 72 hours. Thyroid Function Tests: No results for input(s): TSH, T4TOTAL, FREET4, T3FREE, THYROIDAB in the last 72 hours. Anemia Panel: No results for input(s): VITAMINB12, FOLATE, FERRITIN, TIBC, IRON, RETICCTPCT in the last 72 hours. Sepsis Labs: No results for input(s): PROCALCITON, LATICACIDVEN in the last 168 hours.  Recent Results (from the past 240 hour(s))  Culture, blood (routine x 2)     Status: None   Collection Time: 03/20/21 10:42 AM  Specimen: BLOOD  Result Value Ref Range Status   Specimen Description BLOOD RIGHT ANTECUBITAL  Final   Special Requests   Final    BOTTLES DRAWN AEROBIC AND ANAEROBIC Blood Culture adequate volume   Culture   Final    NO GROWTH 5 DAYS Performed at [facility], [address], [place], [place] [address]    Report Status 03/25/2021 FINAL  Final  Culture, blood (routine x 2)     Status: Abnormal   Collection Time: 03/20/21 10:42 AM   Specimen: BLOOD RIGHT ARM  Result Value Ref Range Status   Specimen Description BLOOD RIGHT ARM  Final   Special Requests   Final    BOTTLES DRAWN AEROBIC AND ANAEROBIC Blood Culture adequate volume   Culture  Setup Time   Final    GRAM POSITIVE COCCI AEROBIC BOTTLE ONLY CRITICAL RESULT CALLED TO, READ BACK BY AND VERIFIED WITH: PHARMD J.FRENS AT 0813 ON 03/21/2021 BY T.SAAD. Performed at [facility], [place] [address] [place], [place] [address]    Culture STAPHYLOCOCCUS EPIDERMIDIS (A)  Final   Report Status 03/29/2021 FINAL  Final   Organism ID, Bacteria STAPHYLOCOCCUS EPIDERMIDIS  Final      Susceptibility   Staphylococcus epidermidis - MIC*    CIPROFLOXACIN <=0.5 SENSITIVE Sensitive     ERYTHROMYCIN <=0.25 SENSITIVE Sensitive     GENTAMICIN <=0.5 SENSITIVE Sensitive     OXACILLIN <=0.25 SENSITIVE Sensitive     TETRACYCLINE <=1 SENSITIVE Sensitive     VANCOMYCIN 2 SENSITIVE Sensitive     TRIMETH/SULFA <=10 SENSITIVE Sensitive     CLINDAMYCIN <=0.25 SENSITIVE Sensitive     RIFAMPIN <=0.5 SENSITIVE Sensitive     Inducible Clindamycin NEGATIVE Sensitive     * STAPHYLOCOCCUS EPIDERMIDIS  Blood Culture ID Panel (Reflexed)     Status: Abnormal   Collection Time: 03/20/21 10:42 AM  Result Value Ref Range Status   Enterococcus faecalis NOT DETECTED NOT DETECTED Final   Enterococcus Faecium NOT DETECTED NOT DETECTED Final   Listeria monocytogenes NOT DETECTED NOT DETECTED Final   Staphylococcus species DETECTED (A) NOT DETECTED Final    Comment: CRITICAL RESULT CALLED TO, READ BACK BY AND VERIFIED WITH: PHARMD J.FRENS AT [id number] ON 03/21/2021 BY .T.SAAD.    Staphylococcus aureus (BCID) NOT DETECTED NOT DETECTED Final   Staphylococcus epidermidis DETECTED (A) NOT DETECTED Final    Comment: CRITICAL RESULT CALLED TO, READ BACK BY AND VERIFIED WITH: PHARMD J.FRENS AT [id number] ON 03/21/2021 BY .T.SAAD.    Staphylococcus lugdunensis NOT DETECTED NOT DETECTED Final   Streptococcus species NOT DETECTED NOT DETECTED Final   Streptococcus agalactiae NOT DETECTED NOT DETECTED Final   Streptococcus pneumoniae NOT DETECTED NOT DETECTED Final   Streptococcus pyogenes NOT DETECTED NOT DETECTED Final   A.calcoaceticus-baumannii NOT DETECTED NOT DETECTED Final   Bacteroides fragilis NOT DETECTED NOT DETECTED Final   Enterobacterales NOT DETECTED NOT DETECTED Final   Enterobacter cloacae  complex NOT DETECTED NOT DETECTED Final   Escherichia coli NOT DETECTED NOT DETECTED Final   Klebsiella aerogenes NOT DETECTED NOT DETECTED Final   Klebsiella oxytoca NOT DETECTED NOT DETECTED Final   Klebsiella pneumoniae NOT DETECTED NOT DETECTED Final   Proteus species NOT DETECTED NOT DETECTED Final   Salmonella species NOT DETECTED NOT DETECTED Final   Serratia marcescens NOT DETECTED NOT DETECTED Final   Haemophilus influenzae NOT DETECTED NOT DETECTED Final   Neisseria meningitidis NOT DETECTED NOT DETECTED Final   Pseudomonas aeruginosa NOT DETECTED NOT DETECTED  Final   Stenotrophomonas maltophilia NOT DETECTED NOT DETECTED Final   Candida albicans NOT DETECTED NOT DETECTED Final   Candida auris NOT DETECTED NOT DETECTED Final   Candida glabrata NOT DETECTED NOT DETECTED Final   Candida krusei NOT DETECTED NOT DETECTED Final   Candida parapsilosis NOT DETECTED NOT DETECTED Final   Candida tropicalis NOT DETECTED NOT DETECTED Final   Cryptococcus neoformans/gattii NOT DETECTED NOT DETECTED Final   Methicillin resistance mecA/C NOT DETECTED NOT DETECTED Final    Comment: Performed at [facility], [place] [address] [place], [place] [address]  SARS CORONAVIRUS 2 (TAT 6-24 HRS) Nasopharyngeal Nasopharyngeal Swab     Status: None   Collection Time: 03/22/21  2:36 PM   Specimen: Nasopharyngeal Swab  Result Value Ref Range Status   SARS Coronavirus 2 NEGATIVE NEGATIVE Final    Comment: (NOTE) SARS-CoV-2 target nucleic acids are NOT DETECTED.  The SARS-CoV-2 RNA is generally detectable in upper and lower respiratory specimens during the acute phase of infection. Negative results do not preclude SARS-CoV-2 infection, do not rule out co-infections with other pathogens, and should not be used as the sole basis for treatment or other patient management decisions. Negative results must be combined with clinical observations, patient history, and epidemiological information. The  expected result is Negative.  Fact Sheet for Patients: SugarRoll.be  Fact Sheet for Healthcare Providers: https://www.woods-mathews.com/  This test is not yet approved or cleared by the [place] FDA and  has been authorized for detection and/or diagnosis of SARS-CoV-2 by FDA under an Emergency Use Authorization (EUA). This EUA will remain  in effect (meaning this test can be used) for the duration of the COVID-19 declaration under Se ction 564(b)(1) of the Act, 21 U.S.C. section 360bbb-3(b)(1), unless the authorization is terminated or revoked sooner.  Performed at [facility], [place] [address] [place], [place] [address]   Culture, blood (routine x 2)     Status: None (Preliminary result)   Collection Time: 03/27/21  8:32 AM   Specimen: BLOOD RIGHT HAND  Result Value Ref Range Status   Specimen Description BLOOD RIGHT HAND  Final   Special Requests   Final    BOTTLES DRAWN AEROBIC AND ANAEROBIC Blood Culture adequate volume   Culture   Final    NO GROWTH 2 DAYS Performed at [facility], [place] [address], [place], [place] [address]    Report Status PENDING  Incomplete  Culture, blood (routine x 2)     Status: None (Preliminary result)   Collection Time: 03/27/21  8:32 AM   Specimen: BLOOD LEFT HAND  Result Value Ref Range Status   Specimen Description BLOOD LEFT HAND  Final   Special Requests   Final    BOTTLES DRAWN AEROBIC AND ANAEROBIC Blood Culture adequate volume   Culture   Final    NO GROWTH 2 DAYS Performed at [facility], [place] [address], [place], [place] [address]    Report Status PENDING  Incomplete          Radiology Studies: No results found.      Scheduled Meds:  apixaban  2.5 mg Oral BID   atorvastatin  40 mg Oral Daily   diltiazem  120 mg Oral Daily   ezetimibe  10 mg Oral Daily   metoprolol tartrate  100 mg Oral BID   multivitamin with minerals  1 tablet Oral Daily   sodium  bicarbonate  650 mg Oral Daily   Continuous  Infusions:     LOS: 11 days    Time spent: 35 minutes    Shuan Statzer A Tomiko Schoon, MD [facility]   If 7PM-7AM, please contact night-coverage [email or web address]  03/29/2021, 1:55 PM

## 2021-03-29 NOTE — Care Management Important Message (Signed)
Important Message  Patient Details  Name: Heather Saunders MRN: [id number] Date of Birth: September 03, 1925   Medicare Important Message Given:  Yes     [name] P [place] 03/29/2021, 2:57 PM

## 2021-03-29 NOTE — TOC Progression Note (Addendum)
Transition of Care [facility]) - Progression Note    Patient Details  Name: Osia Aye MRN: [id number] Date of Birth: [birth year]  Transition of Care [facility] CM/SW [place], [place] Phone Number: 03/29/2021, 10:57 AM  Clinical Narrative:    1054: CSW spoke with [name]; he wanted to know about the process of the appeal. CSW explained that we were still waiting on a decision from the insurance, [name] shared that his aunt has been expressing that she's ready to go home. CSW will follow up with insurance and follow up.  [id number]: CSW contacted [facility] for pt appeal, they explained that there is no decision made yet but the decision should be made no later than tomorrow by 10:40am. The appeal number [id number], CSW will follow up with status throughout the day and first thing in the morning.    Expected Discharge Plan: [place] Barriers to Discharge: Continued Medical Work up, SNF Pending bed offer, Ship broker  Expected Discharge Plan and Services Expected Discharge Plan: [place] In-house Referral: Clinical Social Work Discharge Planning Services: CM Consult Post Acute Care Choice: [place] Living arrangements for the past 2 months: [facility] Expected Discharge Date: 03/26/21                         HH Arranged: RN, PT, OT, Nurse's Aide           Social Determinants of Health (SDOH) Interventions    Readmission Risk Interventions No flowsheet data found.

## 2021-03-29 NOTE — Plan of Care (Signed)

## 2021-03-30 DIAGNOSIS — I5031 Acute diastolic (congestive) heart failure: Secondary | ICD-10-CM | POA: Diagnosis not present

## 2021-03-30 DIAGNOSIS — I4891 Unspecified atrial fibrillation: Secondary | ICD-10-CM | POA: Diagnosis not present

## 2021-03-30 DIAGNOSIS — I5033 Acute on chronic diastolic (congestive) heart failure: Secondary | ICD-10-CM | POA: Diagnosis not present

## 2021-03-30 DIAGNOSIS — R262 Difficulty in walking, not elsewhere classified: Secondary | ICD-10-CM | POA: Diagnosis not present

## 2021-03-30 DIAGNOSIS — I499 Cardiac arrhythmia, unspecified: Secondary | ICD-10-CM | POA: Diagnosis not present

## 2021-03-30 DIAGNOSIS — I5189 Other ill-defined heart diseases: Secondary | ICD-10-CM | POA: Diagnosis not present

## 2021-03-30 DIAGNOSIS — I10 Essential (primary) hypertension: Secondary | ICD-10-CM | POA: Diagnosis not present

## 2021-03-30 DIAGNOSIS — J9601 Acute respiratory failure with hypoxia: Secondary | ICD-10-CM | POA: Diagnosis not present

## 2021-03-30 DIAGNOSIS — E785 Hyperlipidemia, unspecified: Secondary | ICD-10-CM | POA: Diagnosis not present

## 2021-03-30 DIAGNOSIS — R278 Other lack of coordination: Secondary | ICD-10-CM | POA: Diagnosis not present

## 2021-03-30 DIAGNOSIS — Z7401 Bed confinement status: Secondary | ICD-10-CM | POA: Diagnosis not present

## 2021-03-30 DIAGNOSIS — N184 Chronic kidney disease, stage 4 (severe): Secondary | ICD-10-CM | POA: Diagnosis not present

## 2021-03-30 DIAGNOSIS — M6281 Muscle weakness (generalized): Secondary | ICD-10-CM | POA: Diagnosis not present

## 2021-03-30 DIAGNOSIS — I513 Intracardiac thrombosis, not elsewhere classified: Secondary | ICD-10-CM | POA: Diagnosis not present

## 2021-03-30 DIAGNOSIS — Z743 Need for continuous supervision: Secondary | ICD-10-CM | POA: Diagnosis not present

## 2021-03-30 DIAGNOSIS — R2681 Unsteadiness on feet: Secondary | ICD-10-CM | POA: Diagnosis not present

## 2021-03-30 LAB — RESP PANEL BY RT-PCR (FLU A&B, COVID) ARPGX2
Influenza A by PCR: NEGATIVE
Influenza B by PCR: NEGATIVE
SARS Coronavirus 2 by RT PCR: NEGATIVE

## 2021-03-30 LAB — BASIC METABOLIC PANEL
Anion gap: 7 (ref 5–15)
BUN: 28 mg/dL — ABNORMAL HIGH (ref 8–23)
CO2: 25 mmol/L (ref 22–32)
Calcium: 8.9 mg/dL (ref 8.9–10.3)
Chloride: 104 mmol/L (ref 98–111)
Creatinine, Ser: 2.22 mg/dL — ABNORMAL HIGH (ref 0.44–1.00)
GFR, Estimated: 20 mL/min — ABNORMAL LOW (ref >60)
Glucose, Bld: 118 mg/dL — ABNORMAL HIGH (ref 70–99)
Potassium: 4.1 mmol/L (ref 3.5–5.1)
Sodium: 136 mmol/L (ref 135–145)

## 2021-03-30 MED ORDER — SODIUM BICARBONATE 650 MG PO TABS: 650.0000 mg | ORAL_TABLET | Freq: Every day | ORAL | 0 refills | Status: DC

## 2021-03-30 NOTE — Progress Notes (Signed)
Attempted to call report to [facility] x3 - finally got in touch with someone who took the number and said someone will call us back as they are unable to take report at this time.

## 2021-03-30 NOTE — Progress Notes (Signed)
Pt IV's removed, Pt belongs packed in pt bag and patient belongings bag. PTAR transported patient to [facility]. Pt belongings sent with patient.

## 2021-03-30 NOTE — Discharge Summary (Signed)
Physician Discharge Summary  Heather Saunders [id number] DOB: 1924/10/11 DOA: 03/17/2021  PCP: Kathyrn Lass, MD  Admit date: 03/17/2021 Discharge date: 03/30/2021  Admitted From: Home  Disposition:  Home   Recommendations for Outpatient Follow-up:  Follow up with PCP in 1-2 weeks Please obtain BMP/CBC in one week Monitor renal function and volume status.  Follow final blood culture results.     Discharge Condition: Stable.  CODE STATUS: Full Code Diet recommendation: Heart Healthy   Brief/Interim Summary: 87 year old with past medical history significant for hypertension, CKD stage IV who was admitted with A. fib RVR, acute diastolic heart failure and AKI on CKD stage IV.  She was found to have right atrial thrombus.  She was a started on Eliquis.  She had 1 out of 4 blood cultures positive for staph epi.  Plan to repeat blood cultures today.  She has been getting ceftriaxone, for treatment of community-acquired pneumonia.    1-A. fib with RVR new onset, acute diastolic heart failure/pulmonary hypertension: -She was initially treated with Cardizem drip.  Cardiology was consulted. -Echo showed left ventricular ejection fraction 60 to 65% with severe pulmonary hypertension, concern for possible thrombus or myxoma in the right atrium. -Plan is for rate control and continue with Eliquis. -Received IV Lasix during this hospitalization for pulmonary edema.  Monitor volume status -Monitor weight; 117--118--119. Fluids restriction.  -started  low dose lasix. renal function stable.    2-Right Atrial Thrombus: -Transthoracic echo showed mobile filamentous structure in the right atrium concerning for possible thrombus or myxoma. -TEE showed mobile filamentous mass measuring 1.1 cm, about 0.6 cm in the right atrium which appeared to be attached to the eustachian valve and extending into the intra-atrial septum. -1 out of 4 blood cultures positive for staph epi.  Likely contaminant.  Repeated  Blood culture 6/28; No growth to date. -Continue apixaban.   3-AKI on CKD stage IV with metabolic acidosis: Creatinine range 1.6--- 1.9 -Creatinine 2.4.  This might be her new baseline per nephrology. -Continue with sodium bicarb, reduced to her daily dose.   4-CAP: Completed Zithromax. Completed ceftriaxone  5 days   5-hyperlipidemia, hypertension: Continue with atorvastatin and metoprolol.    Discharge Diagnoses:  Principal Problem:   Atrial fibrillation with RVR ([place]) Active Problems:   Essential hypertension   Mixed hyperlipidemia   AKI (acute kidney injury) ([place])   Acute kidney injury ([facility])   Pulmonary HTN (H[facility])   Right atrial mass   Acute diastolic CHF (congestive heart failure) [facility])    Discharge Instructions  Discharge Instructions     Amb referral to AFIB Clinic   Complete by: As directed    Amb referral to HF Clinic   Complete by: As directed    Call MD for:  persistant dizziness or light-headedness   Complete by: As directed    Diet - low sodium heart healthy   Complete by: As directed    Diet - low sodium heart healthy   Complete by: As directed    Diet - low sodium heart healthy   Complete by: As directed    Discharge instructions   Complete by: As directed    1) Repeat BMP in 5 days and if bicarb normal no more po sodium bicarb needed 2) Per Dr. Harrington Challenger they will make her follow up cardiology appointment for evaluation of CHF and continued need of PO lasix.   Increase activity slowly   Complete by: As directed    Increase activity slowly   Complete  by: As directed    Increase activity slowly   Complete by: As directed    Increase activity slowly   Complete by: As directed       Allergies as of 03/30/2021       Reactions   Boniva [ibandronic Acid]    Sore throat   Fosamax [alendronate Sodium]    Sore throat        Medication List     STOP taking these medications    amLODipine 5 MG tablet Commonly known as: NORVASC   aspirin 81 MG  tablet   hydrALAZINE 25 MG tablet Commonly known as: APRESOLINE       TAKE these medications    acetaminophen 500 MG tablet Commonly known as: TYLENOL Take 500 mg by mouth every 6 (six) hours as needed for mild pain.   apixaban 2.5 MG Tabs tablet Commonly known as: ELIQUIS Take 1 tablet (2.5 mg total) by mouth 2 (two) times daily.   atorvastatin 40 MG tablet Commonly known as: LIPITOR Take 40 mg by mouth daily.   Cholecalciferol 100 MCG (4000 UT) Caps Take 4,000 Units by mouth daily.   diltiazem 120 MG 24 hr capsule Commonly known as: CARDIZEM CD Take 1 capsule (120 mg total) by mouth daily.   ezetimibe 10 MG tablet Commonly known as: ZETIA TAKE 1 TABLET BY MOUTH EVERY DAY   furosemide 20 MG tablet Commonly known as: Lasix Take 1 tablet (20 mg total) by mouth daily.   metoprolol tartrate 100 MG tablet Commonly known as: LOPRESSOR Take 1 tablet (100 mg total) by mouth 2 (two) times daily. What changed:  medication strength how much to take additional instructions   MiraLax Mix-In Pax 17 g packet Generic drug: polyethylene glycol Take 17 g by mouth daily as needed for mild constipation.   multivitamin capsule Take 1 capsule by mouth daily.   sodium bicarbonate 650 MG tablet Take 1 tablet (650 mg total) by mouth daily. Start taking on: March 31, 2021        Follow-up Information     Imogene Burn, PA-C Follow up.   Specialty: Cardiology Why: Hospital follow-up with Cardiology scheduled for 05/02/2021 at 11:15am with Ermalinda Barrios, one of Dr. Alan Ripper PAs. Please arrive 15 minutes early for check-in. If this date/time does not work for you, please call our office to reschedule. Contact information: [address] [place] [address] [phone]         Kathyrn Lass, MD Follow up in 1 week(s).   Specialty: Family Medicine Contact information: [place] [address] [phone]         Fay Records, MD .    Specialty: Cardiology Contact information: [address] [place] [address] [phone]                Allergies  Allergen Reactions   Boniva [Ibandronic Acid]     Sore throat   Fosamax [Alendronate Sodium]     Sore throat    Consultations: Cardiology    Procedures/Studies: DG Chest 1 View  Result Date: 03/22/2021 CLINICAL DATA:  Dyspnea EXAM: CHEST  1 VIEW COMPARISON:  03/21/2021 FINDINGS: Cardiac shadow is enlarged but stable. Aortic calcifications are again seen. Changes of vascular congestion with interstitial edema are noted worsened in the interval from the prior exam. Small effusions are now seen. IMPRESSION: Increasing CHF with small effusions. Electronically Signed   By: Inez Catalina M.D.   On:  03/22/2021 19:49   US RENAL  Result Date: 03/18/2021 CLINICAL DATA:  87 year old female with acute renal insufficiency. EXAM: RENAL / URINARY TRACT ULTRASOUND COMPLETE COMPARISON:  CT Abdomen and Pelvis 10/10/2020. FINDINGS: Right Kidney: Renal measurements: 9.5 x 3.4 x 4.6 cm = volume: 78 mL. Renal cortical thinning and mildly echogenic renal parenchyma (images 7 and 8). No right hydronephrosis or renal mass, lesion. Left Kidney: Renal measurements: 10.6 x 3.9 x 4.6 cm = volume: 98 mL. Similar renal cortical thinning and echogenicity to the right side. Small benign exophytic lower pole cyst measures 14 mm and appears inconsequential (image 41). No left hydronephrosis or renal mass. Bladder: Appears normal for degree of bladder distention. Other: Uterus partially visible, grossly stable and within normal limits compared to January. IMPRESSION: Some evidence of chronic medical renal disease.  Otherwise negative. Electronically Signed   By: Genevie Ann M.D.   On: 03/18/2021 05:11   NM Pulmonary Perf and Vent  Addendum Date: 03/21/2021   ADDENDUM REPORT: 03/21/2021 15:14 ADDENDUM: Please note tiny pleural base defect in the right middle lobe likely corresponds to  the underlying atelectasis or scarring seen on the radiograph. Electronically Signed   By: Anner Crete M.D.   On: 03/21/2021 15:14   Result Date: 03/21/2021 CLINICAL DATA:  87 year old female with concern for pulmonary embolism. EXAM: NUCLEAR MEDICINE PERFUSION LUNG SCAN TECHNIQUE: Perfusion images were obtained in multiple projections after intravenous injection of radiopharmaceutical. Ventilation scans intentionally deferred if perfusion scan and chest x-ray adequate for interpretation during COVID 19 epidemic. RADIOPHARMACEUTICALS:  4.2 mCi Tc-57mMAA IV COMPARISON:  Chest radiograph dated 03/21/2021. FINDINGS: No with straight perfusion defect noted. IMPRESSION: Negative for pulmonary embolism. Electronically Signed: By: AAnner CreteM.D. On: 03/21/2021 14:46   DG CHEST PORT 1 VIEW  Result Date: 03/21/2021 CLINICAL DATA:  Atrial fibrillation.  Shortness of breath. EXAM: PORTABLE CHEST 1 VIEW COMPARISON:  03/19/2021.  03/17/2021. FINDINGS: Mediastinum and hilar structures normal. Cardiomegaly. Mild bilateral pulmonary interstitial prominence consistent with interstitial edema again noted. Similar findings noted on prior exam. Small bilateral pleural effusions noted. Findings suggest CHF. No pneumothorax. Degenerative changes scoliosis thoracic spine. IMPRESSION: Cardiomegaly. Mild bilateral pulmonary interstitial prominence consistent interstitial edema again noted. Similar findings noted on prior exam. Small bilateral pleural effusions noted. Electronically Signed   By: TMarcello Moores Register   On: 03/21/2021 09:52   DG CHEST PORT 1 VIEW  Result Date: 03/19/2021 CLINICAL DATA:  Atrial fibrillation. EXAM: PORTABLE CHEST 1 VIEW COMPARISON:  03/17/2021 FINDINGS: Cardiomegaly, pulmonary vascular congestion and mild interstitial opacities are again identified. Mild bibasilar opacities likely representing atelectasis noted. No pneumothorax or large pleural effusion identified. No other significant change.  IMPRESSION: Cardiomegaly with pulmonary vascular congestion and probable mild interstitial edema. Probable mild bibasilar atelectasis. Electronically Signed   By: JMargarette CanadaM.D.   On: 03/19/2021 08:45   DG Chest Port 1 View  Result Date: 03/17/2021 CLINICAL DATA:  Atrial fibrillation, sore throat EXAM: PORTABLE CHEST 1 VIEW COMPARISON:  10/06/2020 FINDINGS: Single frontal view of the chest demonstrates an enlarged cardiac silhouette. There is central vascular prominence, with mild diffuse interstitial opacities consistent with developing edema. No effusion or pneumothorax. IMPRESSION: 1. Findings consistent with mild interstitial edema. Electronically Signed   By: MRanda NgoM.D.   On: 03/17/2021 23:34   ECHOCARDIOGRAM COMPLETE  Result Date: 03/19/2021    ECHOCARDIOGRAM REPORT   Patient Name:   Heather DOERFLERDate of Exam: 03/19/2021 Medical Rec #:  [id number]  Height:       64.0 in Accession #:    [id number]       Weight:       116.8 lb Date of Birth:  [birth year]        BSA:          1.557 m Patient Age:    87 years         BP:           150/75 mmHg Patient Gender: F                HR:           107 bpm. Exam Location:  Inpatient Procedure: 2D Echo, Cardiac Doppler and Color Doppler Indications:    Atrial fibrillation                 Elevated troponin  History:        Patient has prior history of Echocardiogram examinations, most                 recent 10/29/2020. Previous Myocardial Infarction and CAD; Risk                 Factors:Dyslipidemia. CKD.  Sonographer:    Clayton Lefort RDCS (AE) Referring Phys: [id number] [place]  1. Left ventricular ejection fraction, by estimation, is 60 to 65%. The left ventricle has normal function. The left ventricle has no regional wall motion abnormalities. There is severe left ventricular hypertrophy. Left ventricular diastolic function could not be evaluated.  2. Right ventricular systolic function is normal. The right ventricular size is normal.  There is severely elevated pulmonary artery systolic pressure. The estimated right ventricular systolic pressure is [id number] mmHg.  3. Left atrial size was moderately dilated.  4. Mobile filamentous structure, which appears attached to the interatrial septum - consider thrombus or possible myxoma (image 67/68) - measuring ~0.6 x 2.0 cm. Right atrial size was mildly dilated.  5. The mitral valve is abnormal. Mild mitral valve regurgitation.  6. The aortic valve is tricuspid. Aortic valve regurgitation is mild. Mild aortic valve sclerosis is present, with no evidence of aortic valve stenosis.  7. The inferior vena cava is dilated in size with >50% respiratory variability, suggesting right atrial pressure of 8 mmHg. Comparison(s): Changes from prior study are noted. 10/29/2020: LVEF 60-65%, mobile RA mass can be seen but not noted in this study. FINDINGS  Left Ventricle: Left ventricular ejection fraction, by estimation, is 60 to 65%. The left ventricle has normal function. The left ventricle has no regional wall motion abnormalities. The left ventricular internal cavity size was normal in size. There is  severe left ventricular hypertrophy. Left ventricular diastolic function could not be evaluated due to atrial fibrillation. Left ventricular diastolic function could not be evaluated. Right Ventricle: The right ventricular size is normal. No increase in right ventricular wall thickness. Right ventricular systolic function is normal. There is severely elevated pulmonary artery systolic pressure. The tricuspid regurgitant velocity is 3.63 m/s, and with an assumed right atrial pressure of 8 mmHg, the estimated right ventricular systolic pressure is [id number] mmHg. Left Atrium: Left atrial size was moderately dilated. Right Atrium: Mobile filamentous structure, which appears attached to the interatrial septum - consider thrombus or possible myxoma - measuring ~0.6 x 2.0 cm. Right atrial size was mildly dilated. Pericardium: There  is no evidence of pericardial effusion. Mitral Valve: The mitral valve is abnormal. There is mild thickening of the mitral valve leaflet(s).  There is mild calcification of the mitral valve leaflet(s). Mild mitral valve regurgitation. Tricuspid Valve: The tricuspid valve is grossly normal. Tricuspid valve regurgitation is trivial. Aortic Valve: The aortic valve is tricuspid. Aortic valve regurgitation is mild. Aortic regurgitation PHT measures 676 msec. Mild aortic valve sclerosis is present, with no evidence of aortic valve stenosis. Aortic valve mean gradient measures 3.0 mmHg. Aortic valve peak gradient measures 4.9 mmHg. Aortic valve area, by VTI measures 4.19 cm. Pulmonic Valve: The pulmonic valve was normal in structure. Pulmonic valve regurgitation is not visualized. Aorta: The aortic root and ascending aorta are structurally normal, with no evidence of dilitation. Venous: The inferior vena cava is dilated in size with greater than 50% respiratory variability, suggesting right atrial pressure of 8 mmHg. IAS/Shunts: No atrial level shunt detected by color flow Doppler.  LEFT VENTRICLE PLAX 2D LVIDd:         2.90 cm LVIDs:         2.40 cm LV PW:         1.60 cm LV IVS:        1.80 cm LVOT diam:     2.10 cm LV SV:         74 LV SV Index:   47 LVOT Area:     3.46 cm  RIGHT VENTRICLE             IVC RV S prime:     10.20 cm/s  IVC diam: 2.50 cm TAPSE (M-mode): 1.5 cm LEFT ATRIUM             Index       RIGHT ATRIUM           Index LA diam:        3.40 cm 2.18 cm/m  RA Area:     10.70 cm LA Vol (A2C):   43.5 ml 27.94 ml/m RA Volume:   20.60 ml  13.23 ml/m LA Vol (A4C):   76.7 ml 49.27 ml/m LA Biplane Vol: 62.9 ml 40.40 ml/m  AORTIC VALVE AV Area (Vmax):    3.63 cm AV Area (Vmean):   3.40 cm AV Area (VTI):     4.19 cm AV Vmax:           110.80 cm/s AV Vmean:          81.600 cm/s AV VTI:            0.176 m AV Peak Grad:      4.9 mmHg AV Mean Grad:      3.0 mmHg LVOT Vmax:         116.20 cm/s LVOT Vmean:         80.120 cm/s LVOT VTI:          0.213 m LVOT/AV VTI ratio: 1.21 AI PHT:            676 msec  AORTA Ao Root diam: 3.50 cm Ao Asc diam:  3.40 cm TRICUSPID VALVE TR Peak grad:   52.7 mmHg TR Vmax:        363.00 cm/s  SHUNTS Systemic VTI:  0.21 m Systemic Diam: 2.10 cm Lyman Bishop MD Electronically signed by Lyman Bishop MD Signature Date/Time: 03/19/2021/6:09:56 PM    Final    ECHO TEE  Result Date: 03/20/2021    TRANSESOPHOGEAL ECHO REPORT   Patient Name:   TIASIA TYREE Date of Exam: 03/20/2021 Medical Rec #:  [id number]        Height:  64.0 in Accession #:    [id number]       Weight:       117.9 lb Date of Birth:  24-Sep-1925        BSA:          1.563 m Patient Age:    58 years         BP:           132/57 mmHg Patient Gender: F                HR:           93 bpm. Exam Location:  Inpatient Procedure: Transesophageal Echo, Cardiac Doppler and Color Doppler Indications:     Atrial fibrillation  History:         Patient has prior history of Echocardiogram examinations, most                  recent 03/19/2021. CAD, Arrythmias:Atrial Fibrillation,                  Signs/Symptoms:CKD; Risk Factors:Hypertension.  Sonographer:     Dustin Flock Referring Phys:  [id number] Leanor Kail Diagnosing Phys: Oswaldo Milian MD PROCEDURE: The transesophogeal probe was passed without difficulty through the esophogus of the patient. Sedation performed by different physician. The patient was monitored while under deep sedation. Anesthestetic sedation was provided intravenously by Anesthesiology: 152.'48mg'$  of Propofol. The patient developed no complications during the procedure. IMPRESSIONS  1. Mobile filamentous mass measuring 1.7cm x 0.6cm in the right atrium, appears attached to eustachian valve and extends to the interatrial septum. Suspect likely thrombus but would also recommend checking blood cultures to evaluate for endocarditis. Cardioversion deferred given suspected RA thrombus  2. Left ventricular  ejection fraction, by estimation, is 60 to 65%. The left ventricle has normal function. There is severe left ventricular hypertrophy.  3. Right ventricular systolic function is normal. The right ventricular size is normal.  4. Left atrial size was mildly dilated. No left atrial/left atrial appendage thrombus was detected.  5. The mitral valve is normal in structure. Mild mitral valve regurgitation.  6. The aortic valve is tricuspid. Aortic valve regurgitation is mild. Mild aortic valve sclerosis is present, with no evidence of aortic valve stenosis.  7. Pulmonic valve regurgitation is mild to moderate. FINDINGS  Left Ventricle: Left ventricular ejection fraction, by estimation, is 60 to 65%. The left ventricle has normal function. The left ventricular internal cavity size was small. There is severe left ventricular hypertrophy. Right Ventricle: The right ventricular size is normal. No increase in right ventricular wall thickness. Right ventricular systolic function is normal. Left Atrium: Left atrial size was mildly dilated. No left atrial/left atrial appendage thrombus was detected. Right Atrium: Right atrial size was normal in size. Pericardium: There is no evidence of pericardial effusion. Mitral Valve: The mitral valve is normal in structure. Mild mitral valve regurgitation. Tricuspid Valve: The tricuspid valve is normal in structure. Tricuspid valve regurgitation is mild. Aortic Valve: The aortic valve is tricuspid. Aortic valve regurgitation is mild. Mild aortic valve sclerosis is present, with no evidence of aortic valve stenosis. Aortic valve peak gradient measures 10.0 mmHg. Pulmonic Valve: The pulmonic valve was grossly normal. Pulmonic valve regurgitation is moderate. Aorta: The aortic root and ascending aorta are structurally normal, with no evidence of dilitation. IAS/Shunts: No atrial level shunt detected by color flow Doppler.  AORTIC VALVE AV Vmax:      158.00 cm/s AV Peak Grad: 10.0 mmHg Harrell Gave  Gardiner Rhyme MD Electronically signed by Oswaldo Milian MD Signature Date/Time: 03/20/2021/10:59:34 AM    Final    VAS [place] LOWER EXTREMITY VENOUS (DVT)  Result Date: 03/22/2021  Lower Venous DVT Study Patient Name:  Heather Saunders  Date of Exam:   03/21/2021 Medical Rec #: [id number]         Accession #:    [id number] Date of Birth: Aug 27, 1925         Patient Gender: F Patient Age:   67Y Exam Location:  [facility] Procedure:      VAS [place] LOWER EXTREMITY VENOUS (DVT) Referring Phys: [id number] TRACI R TURNER --------------------------------------------------------------------------------  Indications: Edema.  Comparison Study: no prior Performing Technologist: Archie Patten RVS  Examination Guidelines: A complete evaluation includes B-mode imaging, spectral Doppler, color Doppler, and power Doppler as needed of all accessible portions of each vessel. Bilateral testing is considered an integral part of a complete examination. Limited examinations for reoccurring indications may be performed as noted. The reflux portion of the exam is performed with the patient in reverse Trendelenburg.  +---------+---------------+---------+-----------+----------+-------------------+ RIGHT    CompressibilityPhasicitySpontaneityPropertiesThrombus Aging      +---------+---------------+---------+-----------+----------+-------------------+ CFV      Full           Yes      Yes                                      +---------+---------------+---------+-----------+----------+-------------------+ SFJ      Full                                                             +---------+---------------+---------+-----------+----------+-------------------+ FV Prox  Full                                                             +---------+---------------+---------+-----------+----------+-------------------+ FV Mid   Full                                                              +---------+---------------+---------+-----------+----------+-------------------+ FV DistalFull                                                             +---------+---------------+---------+-----------+----------+-------------------+ PFV      Full                                                             +---------+---------------+---------+-----------+----------+-------------------+ POP      Full  Yes      Yes                                      +---------+---------------+---------+-----------+----------+-------------------+ PTV      Full                                                             +---------+---------------+---------+-----------+----------+-------------------+ PERO                                                  Not well visualized +---------+---------------+---------+-----------+----------+-------------------+   +---------+---------------+---------+-----------+----------+--------------+ LEFT     CompressibilityPhasicitySpontaneityPropertiesThrombus Aging +---------+---------------+---------+-----------+----------+--------------+ CFV      Full           Yes      Yes                                 +---------+---------------+---------+-----------+----------+--------------+ SFJ      Full                                                        +---------+---------------+---------+-----------+----------+--------------+ FV Prox  Full                                                        +---------+---------------+---------+-----------+----------+--------------+ FV Mid   Full                                                        +---------+---------------+---------+-----------+----------+--------------+ FV DistalFull                                                        +---------+---------------+---------+-----------+----------+--------------+ PFV      Full                                                         +---------+---------------+---------+-----------+----------+--------------+ POP      Full           Yes      Yes                                 +---------+---------------+---------+-----------+----------+--------------+ PTV  Full                                                        +---------+---------------+---------+-----------+----------+--------------+ PERO     Full                                                        +---------+---------------+---------+-----------+----------+--------------+     Summary: BILATERAL: - No evidence of deep vein thrombosis seen in the lower extremities, bilaterally. - RIGHT: - A cystic structure is found in the popliteal fossa.  LEFT: - No cystic structure found in the popliteal fossa.  *See table(s) above for measurements and observations. Electronically signed by Jamelle Haring on 03/22/2021 at 12:28:50 PM.    Final       Subjective: She is feeling better. Denies chest pain   Discharge Exam: Vitals:   03/30/21 0725 03/30/21 1057  BP: (!) 151/74 140/76  Pulse:    Resp: 12 20  Temp: 98.7 F (37.1 C) 98.1 F (36.7 C)  SpO2:       General: Pt is alert, awake, not in acute distress Cardiovascular: RRR, S1/S2 +, no rubs, no gallops Respiratory: CTA bilaterally, no wheezing, no rhonchi Abdominal: Soft, NT, ND, bowel sounds + Extremities: no edema, no cyanosis    The results of significant diagnostics from this hospitalization (including imaging, microbiology, ancillary and laboratory) are listed below for reference.     Microbiology: Recent Results (from the past 240 hour(s))  SARS CORONAVIRUS 2 (TAT 6-24 HRS) Nasopharyngeal Nasopharyngeal Swab     Status: None   Collection Time: 03/22/21  2:36 PM   Specimen: Nasopharyngeal Swab  Result Value Ref Range Status   SARS Coronavirus 2 NEGATIVE NEGATIVE Final    Comment: (NOTE) SARS-CoV-2 target nucleic acids are NOT DETECTED.  The SARS-CoV-2 RNA is generally  detectable in upper and lower respiratory specimens during the acute phase of infection. Negative results do not preclude SARS-CoV-2 infection, do not rule out co-infections with other pathogens, and should not be used as the sole basis for treatment or other patient management decisions. Negative results must be combined with clinical observations, patient history, and epidemiological information. The expected result is Negative.  Fact Sheet for Patients: SugarRoll.be  Fact Sheet for Healthcare Providers: https://www.woods-mathews.com/  This test is not yet approved or cleared by the [place] FDA and  has been authorized for detection and/or diagnosis of SARS-CoV-2 by FDA under an Emergency Use Authorization (EUA). This EUA will remain  in effect (meaning this test can be used) for the duration of the COVID-19 declaration under Se ction 564(b)(1) of the Act, 21 U.S.C. section 360bbb-3(b)(1), unless the authorization is terminated or revoked sooner.  Performed at [facility], [place] [address], [place], [place] [address]   Culture, blood (routine x 2)     Status: None (Preliminary result)   Collection Time: 03/27/21  8:32 AM   Specimen: BLOOD RIGHT HAND  Result Value Ref Range Status   Specimen Description BLOOD RIGHT HAND  Final   Special Requests   Final    BOTTLES DRAWN AEROBIC AND ANAEROBIC Blood Culture adequate volume   Culture  Final    NO GROWTH 3 DAYS Performed at [facility], [place] [address], [place], [place] [address]    Report Status PENDING  Incomplete  Culture, blood (routine x 2)     Status: None (Preliminary result)   Collection Time: 03/27/21  8:32 AM   Specimen: BLOOD LEFT HAND  Result Value Ref Range Status   Specimen Description BLOOD LEFT HAND  Final   Special Requests   Final    BOTTLES DRAWN AEROBIC AND ANAEROBIC Blood Culture adequate volume   Culture   Final    NO GROWTH 3 DAYS Performed at  [facility], [place] [address] [place], [place] [address]    Report Status PENDING  Incomplete     Labs: BNP (last 3 results) Recent Labs    03/18/21 0038  BNP [id number]*   Basic Metabolic Panel: Recent Labs  Lab 03/25/21 0047 03/26/21 0041 03/27/21 0726 03/29/21 0017 03/30/21 0056  NA 138 136 138 137 136  K 4.0 4.2 3.9 3.9 4.1  CL 105 103 99 103 104  CO2 '26 24 25 27 25  '$ GLUCOSE 114* 123* 112* 113* 118*  BUN 47* 45* 35* 29* 28*  CREATININE 2.49* 2.40* 2.20* 2.18* 2.22*  CALCIUM 9.1 9.1 9.3 8.9 8.9   Liver Function Tests: No results for input(s): AST, ALT, ALKPHOS, BILITOT, PROT, ALBUMIN in the last 168 hours. No results for input(s): LIPASE, AMYLASE in the last 168 hours. No results for input(s): AMMONIA in the last 168 hours. CBC: Recent Labs  Lab 03/25/21 0047 03/26/21 0041 03/27/21 0726 03/29/21 0017  WBC 12.9* 14.5* 14.4* 13.8*  HGB 9.3* 9.7* 10.1* 9.5*  HCT 28.7* 30.1* 32.0* 30.3*  MCV 87.2 88.3 88.6 88.6  PLT 282 288 350 328   Cardiac Enzymes: No results for input(s): CKTOTAL, CKMB, CKMBINDEX, TROPONINI in the last 168 hours. BNP: Invalid input(s): POCBNP CBG: No results for input(s): GLUCAP in the last 168 hours. D-Dimer No results for input(s): DDIMER in the last 72 hours. Hgb A1c No results for input(s): HGBA1C in the last 72 hours. Lipid Profile No results for input(s): CHOL, HDL, LDLCALC, TRIG, CHOLHDL, LDLDIRECT in the last 72 hours. Thyroid function studies No results for input(s): TSH, T4TOTAL, T3FREE, THYROIDAB in the last 72 hours.  Invalid input(s): FREET3 Anemia work up No results for input(s): VITAMINB12, FOLATE, FERRITIN, TIBC, IRON, RETICCTPCT in the last 72 hours. Urinalysis    Component Value Date/Time   COLORURINE YELLOW 03/18/2021 0720   APPEARANCEUR HAZY (A) 03/18/2021 0720   LABSPEC 1.018 03/18/2021 0720   PHURINE 5.0 03/18/2021 0720   GLUCOSEU NEGATIVE 03/18/2021 0720   HGBUR NEGATIVE 03/18/2021 0720   BILIRUBINUR  NEGATIVE 03/18/2021 0720   KETONESUR 5 (A) 03/18/2021 0720   PROTEINUR 100 (A) 03/18/2021 0720   NITRITE NEGATIVE 03/18/2021 0720   LEUKOCYTESUR NEGATIVE 03/18/2021 0720   Sepsis Labs Invalid input(s): PROCALCITONIN,  WBC,  LACTICIDVEN Microbiology Recent Results (from the past 240 hour(s))  SARS CORONAVIRUS 2 (TAT 6-24 HRS) Nasopharyngeal Nasopharyngeal Swab     Status: None   Collection Time: 03/22/21  2:36 PM   Specimen: Nasopharyngeal Swab  Result Value Ref Range Status   SARS Coronavirus 2 NEGATIVE NEGATIVE Final    Comment: (NOTE) SARS-CoV-2 target nucleic acids are NOT DETECTED.  The SARS-CoV-2 RNA is generally detectable in upper and lower respiratory specimens during the acute phase of infection. Negative results do not preclude SARS-CoV-2 infection, do not rule out co-infections with other pathogens, and  should not be used as the sole basis for treatment or other patient management decisions. Negative results must be combined with clinical observations, patient history, and epidemiological information. The expected result is Negative.  Fact Sheet for Patients: SugarRoll.be  Fact Sheet for Healthcare Providers: https://www.woods-mathews.com/  This test is not yet approved or cleared by the [place] FDA and  has been authorized for detection and/or diagnosis of SARS-CoV-2 by FDA under an Emergency Use Authorization (EUA). This EUA will remain  in effect (meaning this test can be used) for the duration of the COVID-19 declaration under Se ction 564(b)(1) of the Act, 21 U.S.C. section 360bbb-3(b)(1), unless the authorization is terminated or revoked sooner.  Performed at [facility], [place] [address], [place], [place] [address]   Culture, blood (routine x 2)     Status: None (Preliminary result)   Collection Time: 03/27/21  8:32 AM   Specimen: BLOOD RIGHT HAND  Result Value Ref Range Status   Specimen Description  BLOOD RIGHT HAND  Final   Special Requests   Final    BOTTLES DRAWN AEROBIC AND ANAEROBIC Blood Culture adequate volume   Culture   Final    NO GROWTH 3 DAYS Performed at [facility], [place] [address], [place], [place] [address]    Report Status PENDING  Incomplete  Culture, blood (routine x 2)     Status: None (Preliminary result)   Collection Time: 03/27/21  8:32 AM   Specimen: BLOOD LEFT HAND  Result Value Ref Range Status   Specimen Description BLOOD LEFT HAND  Final   Special Requests   Final    BOTTLES DRAWN AEROBIC AND ANAEROBIC Blood Culture adequate volume   Culture   Final    NO GROWTH 3 DAYS Performed at [facility], [place] [address], [place], [place] [address]    Report Status PENDING  Incomplete     Time coordinating discharge: 40 minutes  SIGNED:   Elmarie Shiley, MD  [facility]

## 2021-03-30 NOTE — Progress Notes (Signed)
Occupational Therapy Treatment Patient Details Name: Heather Saunders MRN: [id number] DOB: March 31, 1925 Today's Date: 03/30/2021    History of present illness Pt is a 87 y/o female presenting on 6/18 to ED for sudden onset of throat discomfort and concern for possible anginal equivalent. Pt found to have AKI on CKD . Pt with new onset a fib with RVR with pulmonary edema. On 6/21, pt had TEE with RA thrombus noted. Pt deemed medically stable for d/c but pt was denied SNF by insurance and family is appealing, so pt remains hospitalized. PMH: coronary artery disease, essential hypertension, hyperlipidemia.   OT comments  Pt supine in bed and agreeable to OT.  Patient requires supervision for transfers, min guard to close supervision for mobility in room using rollator with cueing for safety and rollator mgmt.  Completing grooming standing at sink with close supervision but fatigues easily.  HR ranged from 89-98 throughout session.  Requested to return back to bed at end of session.  Pt concerned about pain in L side gums, and RN aware--provided medication.  Will follow acutely.  Discussed need for 24/7 support initially, and continue to recommend SNF at this time.    Follow Up Recommendations  SNF;Supervision/Assistance - 24 hour (if SNF declines, will need max HH services (OT,PT, aide) and 24/7 support)    Equipment Recommendations  3 in 1 bedside commode;Other (comment)    Recommendations for Other Services      Precautions / Restrictions Precautions Precautions: Fall Precaution Comments: watch HR Restrictions Weight Bearing Restrictions: No       Mobility Bed Mobility Overal bed mobility: Needs Assistance Bed Mobility: Supine to Sit;Sit to Supine     Supine to sit: Supervision Sit to supine: Supervision   General bed mobility comments: increased time and effort, no physical assistance required    Transfers Overall transfer level: Needs assistance Equipment used: 4-wheeled  walker Transfers: Sit to/from Stand Sit to Stand: Supervision         General transfer comment: for safety, cueing for rollator mgmt    Balance Overall balance assessment: Needs assistance Sitting-balance support: Feet supported Sitting balance-Leahy Scale: Good Sitting balance - Comments: EOB without assist   Standing balance support: No upper extremity supported;During functional activity Standing balance-Leahy Scale: Fair Standing balance comment: requires UE support dynamically                           ADL either performed or assessed with clinical judgement   ADL Overall ADL's : Needs assistance/impaired     Grooming: Oral care;Brushing hair;Supervision/safety;Min guard;Standing Grooming Details (indicate cue type and reason): standing at sink with min guard fading to supervision                 Toilet Transfer: Min guard;Ambulation (rollator) Toilet Transfer Details (indicate cue type and reason): simulated in room         Functional mobility during ADLs: Min guard (rollator) General ADL Comments: pt limited by weakness, decreased activity tolerance     Vision       Perception     Praxis      Cognition Arousal/Alertness: Awake/alert Behavior During Therapy: WFL for tasks assessed/performed Overall Cognitive Status: Impaired/Different from baseline Area of Impairment: Safety/judgement;Memory;Problem solving                     Memory: Decreased short-term memory   Safety/Judgement: Decreased awareness of safety   Problem Solving: Slow processing;Requires verbal  cues General Comments: pt requires cueing for safety, rollator mgmt        Exercises     Shoulder Instructions       General Comments HR 89-98 throughout session, fatigues easily and requests to return back to bed after grooming tasks    Pertinent Vitals/ Pain       Pain Assessment: Faces Faces Pain Scale: Hurts little more Pain Location: L jaw/gums Pain  Descriptors / Indicators: Discomfort Pain Intervention(s): Limited activity within patient's tolerance;Monitored during session;Patient requesting pain meds-RN notified;RN gave pain meds during session  Home Living                                          Prior Functioning/Environment              Frequency  Min 2X/week        Progress Toward Goals  OT Goals(current goals can now be found in the care plan section)  Progress towards OT goals: Progressing toward goals  Acute Rehab OT Goals Patient Stated Goal: rehab then home OT Goal Formulation: With patient  Plan Discharge plan remains appropriate;Frequency remains appropriate    Co-evaluation                 AM-PAC OT "6 Clicks" Daily Activity     Outcome Measure   Help from another person eating meals?: None Help from another person taking care of personal grooming?: A Little Help from another person toileting, which includes using toliet, bedpan, or urinal?: A Little Help from another person bathing (including washing, rinsing, drying)?: A Little Help from another person to put on and taking off regular upper body clothing?: A Little Help from another person to put on and taking off regular lower body clothing?: A Little 6 Click Score: 19    End of Session Equipment Utilized During Treatment: Other (comment) (rollator)  OT Visit Diagnosis: Unsteadiness on feet (R26.81);Other abnormalities of gait and mobility (R26.89);Muscle weakness (generalized) (M62.81)   Activity Tolerance Patient tolerated treatment well   Patient Left in bed;with call bell/phone within reach;with bed alarm set   Nurse Communication Mobility status        Time: [id number] OT Time Calculation (min): 21 min  Charges: OT General Charges $OT Visit: 1 Visit OT Treatments $Self Care/Home Management : 8-22 mins  Jolaine Artist, OT Acute Rehabilitation Services Pager [phone] Office  [place] 03/30/2021, 11:14 AM

## 2021-03-30 NOTE — TOC Transition Note (Addendum)
Transition of Care [facility]) - CM/SW Discharge Note   Patient Details  Name: Heather Saunders MRN: [id number] Date of Birth: 12/29/1924  Transition of Care [facility]) CM/SW Contact:  Tresa Endo Phone Number: 03/30/2021, 1:14 PM   Clinical Narrative:    Patient will DC to: [facility] Anticipated DC date: 03/30/2021 Family notified: Pt Nephew Transport by: Corey Harold   Per MD patient ready for DC to [facility] 213. RN to call report prior to discharge [phone]). RN, patient, patient's family, and facility notified of DC. Discharge Summary and FL2 sent to facility. DC packet on chart. Ambulance transport requested for patient.   CSW will sign off for now as social work intervention is no longer needed. Please consult [place] again if new needs arise.       Barriers to Discharge: Continued Medical Work up, SNF Pending bed offer, Insurance Authorization   Patient Goals and CMS Choice Patient states their goals for this hospitalization and ongoing recovery are:: Pt is agreeable to SNF placement, with the plan of returning home. CMS Medicare.gov Compare Post Acute Care list provided to:: Patient Choice offered to / list presented to : Patient  Discharge Placement                       Discharge Plan and Services In-house Referral: Clinical Social Work Discharge Planning Services: CM Consult Post Acute Care Choice: Skilled Nursing Facility                    HH Arranged: RN, PT, OT, Nurse's Aide          Social Determinants of Health (SDOH) Interventions     Readmission Risk Interventions No flowsheet data found.

## 2021-03-30 NOTE — Progress Notes (Signed)
Report called to RN at [facility] - all questions answered, patient awaiting PTAR pick up.

## 2021-03-30 NOTE — TOC Progression Note (Signed)
Transition of Care [facility]) - Progression Note    Patient Details  Name: Heather Saunders MRN: [id number] Date of Birth: 08/13/1925  Transition of Care [facility]) CM/SW Contact  Reece Agar, [place] Phone Number: 03/30/2021, 12:26 PM  Clinical Narrative:    [id number]: Pt appeal has been overturned, approval for 6 days. Next review date on 7/5.  1215: CSW spoke with Narda Rutherford from [facility] who will follow back up on bed for pt. CSW spoke with [name] to inform him about the insurance approval, he will share information with other family member  and wait on CSW call for DC.    Expected Discharge Plan: [place] Barriers to Discharge: Continued Medical Work up, SNF Pending bed offer, Ship broker  Expected Discharge Plan and Services Expected Discharge Plan: [place] In-house Referral: Clinical Social Work Discharge Planning Services: CM Consult Post Acute Care Choice: [place] Living arrangements for the past 2 months: [place] Expected Discharge Date: 03/26/21                         HH Arranged: RN, PT, OT, Nurse's Aide           Social Determinants of Health (SDOH) Interventions    Readmission Risk Interventions No flowsheet data found.

## 2021-04-01 LAB — CULTURE, BLOOD (ROUTINE X 2)
Culture: NO GROWTH
Culture: NO GROWTH
Special Requests: ADEQUATE
Special Requests: ADEQUATE

## 2021-04-06 DIAGNOSIS — I513 Intracardiac thrombosis, not elsewhere classified: Secondary | ICD-10-CM | POA: Diagnosis not present

## 2021-04-06 DIAGNOSIS — I5033 Acute on chronic diastolic (congestive) heart failure: Secondary | ICD-10-CM | POA: Diagnosis not present

## 2021-04-06 DIAGNOSIS — I4891 Unspecified atrial fibrillation: Secondary | ICD-10-CM | POA: Diagnosis not present

## 2021-04-06 DIAGNOSIS — J9601 Acute respiratory failure with hypoxia: Secondary | ICD-10-CM | POA: Diagnosis not present

## 2021-04-11 DIAGNOSIS — I4891 Unspecified atrial fibrillation: Secondary | ICD-10-CM | POA: Diagnosis not present

## 2021-04-11 DIAGNOSIS — I272 Pulmonary hypertension, unspecified: Secondary | ICD-10-CM | POA: Diagnosis not present

## 2021-04-11 DIAGNOSIS — I236 Thrombosis of atrium, auricular appendage, and ventricle as current complications following acute myocardial infarction: Secondary | ICD-10-CM | POA: Diagnosis not present

## 2021-04-11 DIAGNOSIS — I130 Hypertensive heart and chronic kidney disease with heart failure and stage 1 through stage 4 chronic kidney disease, or unspecified chronic kidney disease: Secondary | ICD-10-CM | POA: Diagnosis not present

## 2021-04-11 DIAGNOSIS — E782 Mixed hyperlipidemia: Secondary | ICD-10-CM | POA: Diagnosis not present

## 2021-04-11 DIAGNOSIS — I5031 Acute diastolic (congestive) heart failure: Secondary | ICD-10-CM | POA: Diagnosis not present

## 2021-04-11 DIAGNOSIS — Z7901 Long term (current) use of anticoagulants: Secondary | ICD-10-CM | POA: Diagnosis not present

## 2021-04-11 DIAGNOSIS — N184 Chronic kidney disease, stage 4 (severe): Secondary | ICD-10-CM | POA: Diagnosis not present

## 2021-04-16 DIAGNOSIS — I272 Pulmonary hypertension, unspecified: Secondary | ICD-10-CM | POA: Diagnosis not present

## 2021-04-16 DIAGNOSIS — E782 Mixed hyperlipidemia: Secondary | ICD-10-CM | POA: Diagnosis not present

## 2021-04-16 DIAGNOSIS — I236 Thrombosis of atrium, auricular appendage, and ventricle as current complications following acute myocardial infarction: Secondary | ICD-10-CM | POA: Diagnosis not present

## 2021-04-16 DIAGNOSIS — Z7901 Long term (current) use of anticoagulants: Secondary | ICD-10-CM | POA: Diagnosis not present

## 2021-04-16 DIAGNOSIS — N184 Chronic kidney disease, stage 4 (severe): Secondary | ICD-10-CM | POA: Diagnosis not present

## 2021-04-16 DIAGNOSIS — I5031 Acute diastolic (congestive) heart failure: Secondary | ICD-10-CM | POA: Diagnosis not present

## 2021-04-16 DIAGNOSIS — I4891 Unspecified atrial fibrillation: Secondary | ICD-10-CM | POA: Diagnosis not present

## 2021-04-16 DIAGNOSIS — I130 Hypertensive heart and chronic kidney disease with heart failure and stage 1 through stage 4 chronic kidney disease, or unspecified chronic kidney disease: Secondary | ICD-10-CM | POA: Diagnosis not present

## 2021-04-17 DIAGNOSIS — I5031 Acute diastolic (congestive) heart failure: Secondary | ICD-10-CM | POA: Diagnosis not present

## 2021-04-17 DIAGNOSIS — I272 Pulmonary hypertension, unspecified: Secondary | ICD-10-CM | POA: Diagnosis not present

## 2021-04-17 DIAGNOSIS — E782 Mixed hyperlipidemia: Secondary | ICD-10-CM | POA: Diagnosis not present

## 2021-04-17 DIAGNOSIS — I4891 Unspecified atrial fibrillation: Secondary | ICD-10-CM | POA: Diagnosis not present

## 2021-04-17 DIAGNOSIS — Z7901 Long term (current) use of anticoagulants: Secondary | ICD-10-CM | POA: Diagnosis not present

## 2021-04-17 DIAGNOSIS — N184 Chronic kidney disease, stage 4 (severe): Secondary | ICD-10-CM | POA: Diagnosis not present

## 2021-04-17 DIAGNOSIS — I129 Hypertensive chronic kidney disease with stage 1 through stage 4 chronic kidney disease, or unspecified chronic kidney disease: Secondary | ICD-10-CM | POA: Diagnosis not present

## 2021-04-17 DIAGNOSIS — I130 Hypertensive heart and chronic kidney disease with heart failure and stage 1 through stage 4 chronic kidney disease, or unspecified chronic kidney disease: Secondary | ICD-10-CM | POA: Diagnosis not present

## 2021-04-17 DIAGNOSIS — I236 Thrombosis of atrium, auricular appendage, and ventricle as current complications following acute myocardial infarction: Secondary | ICD-10-CM | POA: Diagnosis not present

## 2021-04-19 DIAGNOSIS — I129 Hypertensive chronic kidney disease with stage 1 through stage 4 chronic kidney disease, or unspecified chronic kidney disease: Secondary | ICD-10-CM | POA: Diagnosis not present

## 2021-04-19 DIAGNOSIS — Z7901 Long term (current) use of anticoagulants: Secondary | ICD-10-CM | POA: Diagnosis not present

## 2021-04-19 DIAGNOSIS — I272 Pulmonary hypertension, unspecified: Secondary | ICD-10-CM | POA: Diagnosis not present

## 2021-04-19 DIAGNOSIS — I236 Thrombosis of atrium, auricular appendage, and ventricle as current complications following acute myocardial infarction: Secondary | ICD-10-CM | POA: Diagnosis not present

## 2021-04-19 DIAGNOSIS — I4891 Unspecified atrial fibrillation: Secondary | ICD-10-CM | POA: Diagnosis not present

## 2021-04-19 DIAGNOSIS — I10 Essential (primary) hypertension: Secondary | ICD-10-CM | POA: Diagnosis not present

## 2021-04-19 DIAGNOSIS — I130 Hypertensive heart and chronic kidney disease with heart failure and stage 1 through stage 4 chronic kidney disease, or unspecified chronic kidney disease: Secondary | ICD-10-CM | POA: Diagnosis not present

## 2021-04-19 DIAGNOSIS — N184 Chronic kidney disease, stage 4 (severe): Secondary | ICD-10-CM | POA: Diagnosis not present

## 2021-04-19 DIAGNOSIS — I2510 Atherosclerotic heart disease of native coronary artery without angina pectoris: Secondary | ICD-10-CM | POA: Diagnosis not present

## 2021-04-19 DIAGNOSIS — N1832 Chronic kidney disease, stage 3b: Secondary | ICD-10-CM | POA: Diagnosis not present

## 2021-04-19 DIAGNOSIS — M810 Age-related osteoporosis without current pathological fracture: Secondary | ICD-10-CM | POA: Diagnosis not present

## 2021-04-19 DIAGNOSIS — I5031 Acute diastolic (congestive) heart failure: Secondary | ICD-10-CM | POA: Diagnosis not present

## 2021-04-19 DIAGNOSIS — E782 Mixed hyperlipidemia: Secondary | ICD-10-CM | POA: Diagnosis not present

## 2021-04-21 DIAGNOSIS — I236 Thrombosis of atrium, auricular appendage, and ventricle as current complications following acute myocardial infarction: Secondary | ICD-10-CM | POA: Diagnosis not present

## 2021-04-21 DIAGNOSIS — N184 Chronic kidney disease, stage 4 (severe): Secondary | ICD-10-CM | POA: Diagnosis not present

## 2021-04-21 DIAGNOSIS — Z7901 Long term (current) use of anticoagulants: Secondary | ICD-10-CM | POA: Diagnosis not present

## 2021-04-21 DIAGNOSIS — I5031 Acute diastolic (congestive) heart failure: Secondary | ICD-10-CM | POA: Diagnosis not present

## 2021-04-21 DIAGNOSIS — E782 Mixed hyperlipidemia: Secondary | ICD-10-CM | POA: Diagnosis not present

## 2021-04-21 DIAGNOSIS — I272 Pulmonary hypertension, unspecified: Secondary | ICD-10-CM | POA: Diagnosis not present

## 2021-04-21 DIAGNOSIS — I130 Hypertensive heart and chronic kidney disease with heart failure and stage 1 through stage 4 chronic kidney disease, or unspecified chronic kidney disease: Secondary | ICD-10-CM | POA: Diagnosis not present

## 2021-04-21 DIAGNOSIS — I4891 Unspecified atrial fibrillation: Secondary | ICD-10-CM | POA: Diagnosis not present

## 2021-04-24 DIAGNOSIS — I4891 Unspecified atrial fibrillation: Secondary | ICD-10-CM | POA: Diagnosis not present

## 2021-04-24 DIAGNOSIS — I5031 Acute diastolic (congestive) heart failure: Secondary | ICD-10-CM | POA: Diagnosis not present

## 2021-04-24 DIAGNOSIS — Z7901 Long term (current) use of anticoagulants: Secondary | ICD-10-CM | POA: Diagnosis not present

## 2021-04-24 DIAGNOSIS — I130 Hypertensive heart and chronic kidney disease with heart failure and stage 1 through stage 4 chronic kidney disease, or unspecified chronic kidney disease: Secondary | ICD-10-CM | POA: Diagnosis not present

## 2021-04-24 DIAGNOSIS — E782 Mixed hyperlipidemia: Secondary | ICD-10-CM | POA: Diagnosis not present

## 2021-04-24 DIAGNOSIS — I272 Pulmonary hypertension, unspecified: Secondary | ICD-10-CM | POA: Diagnosis not present

## 2021-04-24 DIAGNOSIS — N184 Chronic kidney disease, stage 4 (severe): Secondary | ICD-10-CM | POA: Diagnosis not present

## 2021-04-24 DIAGNOSIS — I236 Thrombosis of atrium, auricular appendage, and ventricle as current complications following acute myocardial infarction: Secondary | ICD-10-CM | POA: Diagnosis not present

## 2021-04-26 DIAGNOSIS — E782 Mixed hyperlipidemia: Secondary | ICD-10-CM | POA: Diagnosis not present

## 2021-04-26 DIAGNOSIS — Z7901 Long term (current) use of anticoagulants: Secondary | ICD-10-CM | POA: Diagnosis not present

## 2021-04-26 DIAGNOSIS — I5031 Acute diastolic (congestive) heart failure: Secondary | ICD-10-CM | POA: Diagnosis not present

## 2021-04-26 DIAGNOSIS — I130 Hypertensive heart and chronic kidney disease with heart failure and stage 1 through stage 4 chronic kidney disease, or unspecified chronic kidney disease: Secondary | ICD-10-CM | POA: Diagnosis not present

## 2021-04-26 DIAGNOSIS — I236 Thrombosis of atrium, auricular appendage, and ventricle as current complications following acute myocardial infarction: Secondary | ICD-10-CM | POA: Diagnosis not present

## 2021-04-26 DIAGNOSIS — I4891 Unspecified atrial fibrillation: Secondary | ICD-10-CM | POA: Diagnosis not present

## 2021-04-26 DIAGNOSIS — N184 Chronic kidney disease, stage 4 (severe): Secondary | ICD-10-CM | POA: Diagnosis not present

## 2021-04-26 DIAGNOSIS — I272 Pulmonary hypertension, unspecified: Secondary | ICD-10-CM | POA: Diagnosis not present

## 2021-04-27 DIAGNOSIS — Z7901 Long term (current) use of anticoagulants: Secondary | ICD-10-CM | POA: Diagnosis not present

## 2021-04-27 DIAGNOSIS — N184 Chronic kidney disease, stage 4 (severe): Secondary | ICD-10-CM | POA: Diagnosis not present

## 2021-04-27 DIAGNOSIS — I272 Pulmonary hypertension, unspecified: Secondary | ICD-10-CM | POA: Diagnosis not present

## 2021-04-27 DIAGNOSIS — E782 Mixed hyperlipidemia: Secondary | ICD-10-CM | POA: Diagnosis not present

## 2021-04-27 DIAGNOSIS — I130 Hypertensive heart and chronic kidney disease with heart failure and stage 1 through stage 4 chronic kidney disease, or unspecified chronic kidney disease: Secondary | ICD-10-CM | POA: Diagnosis not present

## 2021-04-27 DIAGNOSIS — I5031 Acute diastolic (congestive) heart failure: Secondary | ICD-10-CM | POA: Diagnosis not present

## 2021-04-27 DIAGNOSIS — I4891 Unspecified atrial fibrillation: Secondary | ICD-10-CM | POA: Diagnosis not present

## 2021-04-27 DIAGNOSIS — I236 Thrombosis of atrium, auricular appendage, and ventricle as current complications following acute myocardial infarction: Secondary | ICD-10-CM | POA: Diagnosis not present

## 2021-04-30 DIAGNOSIS — I5031 Acute diastolic (congestive) heart failure: Secondary | ICD-10-CM | POA: Diagnosis not present

## 2021-04-30 DIAGNOSIS — E782 Mixed hyperlipidemia: Secondary | ICD-10-CM | POA: Diagnosis not present

## 2021-04-30 DIAGNOSIS — I236 Thrombosis of atrium, auricular appendage, and ventricle as current complications following acute myocardial infarction: Secondary | ICD-10-CM | POA: Diagnosis not present

## 2021-04-30 DIAGNOSIS — Z7901 Long term (current) use of anticoagulants: Secondary | ICD-10-CM | POA: Diagnosis not present

## 2021-04-30 DIAGNOSIS — I4891 Unspecified atrial fibrillation: Secondary | ICD-10-CM | POA: Diagnosis not present

## 2021-04-30 DIAGNOSIS — N184 Chronic kidney disease, stage 4 (severe): Secondary | ICD-10-CM | POA: Diagnosis not present

## 2021-04-30 DIAGNOSIS — I130 Hypertensive heart and chronic kidney disease with heart failure and stage 1 through stage 4 chronic kidney disease, or unspecified chronic kidney disease: Secondary | ICD-10-CM | POA: Diagnosis not present

## 2021-04-30 DIAGNOSIS — I272 Pulmonary hypertension, unspecified: Secondary | ICD-10-CM | POA: Diagnosis not present

## 2021-04-30 NOTE — Progress Notes (Signed)
Cardiology Office Note    Date:  05/02/2021   ID:  Heather Saunders, DOB [birth year], MRN [id number]   PCP:  Kathyrn Lass, [facility]  Cardiologist:  Dorris Carnes, MD   Advanced Practice Provider:  No care team member to display Electrophysiologist:  None   [phone]   Chief Complaint  Patient presents with   Hospitalization Follow-up     History of Present Illness:  Heather Saunders is a 87 y.o. female with history of CAD nonobstructive on remote cath, hypertension, HLD, CKD stage IV discharged from the hospital 03/30/2021 with A. fib with RVR, diastolic CHF and AKI.  She was found to have a right atrial thrombus and started on Eliquis.  She also had CAP and 1 of 4 blood cultures positive for staph epi.  Patient comes in accompanied by her nephew. Trying to gain strength. Was in rehab for a couple weeks but home now and having therapy. Doesn't notice her Afib or palpitations. Does have DOE and some leg swelling. BP running 130-150/80's. Overall doing much better. Labs checked yest by PCP    Past Medical History:  Diagnosis Date   Chronic kidney disease    Coronary disease    NonObstructive   History of nuclear stress test    Nuclear stress test 10/18: EF 96, normal perfusion, low risk   Hyperlipidemia    Hypertension    Myocardial infarct [facility]) 2006   Osteopenia    Osteoporosis     Past Surgical History:  Procedure Laterality Date   LIPOMA EXCISION     removed from Shoulder   TEE WITHOUT CARDIOVERSION N/A 03/20/2021   Procedure: TRANSESOPHAGEAL ECHOCARDIOGRAM (TEE);  Surgeon: Donato Heinz, MD;  Location: [facility] END[facility]COPY;  Service: Cardiovascular;  Laterality: N/A;    Current Medications: Current Meds  Medication Sig   acetaminophen (TYLENOL) 500 MG tablet Take 500 mg by mouth every 6 (six) hours as needed for mild pain.   apixaban (ELIQUIS) 2.5 MG TABS tablet Take 1 tablet (2.5 mg total) by mouth 2 (two) times daily.    atorvastatin (LIPITOR) 40 MG tablet Take 40 mg by mouth daily.   Cholecalciferol 100 MCG (4000 UT) CAPS Take 4,000 Units by mouth daily.   diltiazem (CARDIZEM CD) 120 MG 24 hr capsule Take 1 capsule (120 mg total) by mouth daily.   ezetimibe (ZETIA) 10 MG tablet TAKE 1 TABLET BY MOUTH EVERY DAY   furosemide (LASIX) 20 MG tablet Take 1 tablet (20 mg total) by mouth daily.   metoprolol tartrate (LOPRESSOR) 100 MG tablet Take 1 tablet (100 mg total) by mouth 2 (two) times daily.   Multiple Vitamin (MULTIVITAMIN) capsule Take 1 capsule by mouth daily.   polyethylene glycol (MIRALAX / GLYCOLAX) 17 g packet Take 17 g by mouth daily as needed for mild constipation.     Allergies:   Boniva [ibandronic acid] and Fosamax [alendronate sodium]   Social History   Socioeconomic History   Marital status: Divorced    Spouse name: Not on file   Number of children: Not on file   Years of education: Not on file   Highest education level: Not on file  Occupational History   Not on file  Tobacco Use   Smoking status: Never   Smokeless tobacco: Never  Vaping Use   Vaping Use: Never used  Substance and Sexual Activity   Alcohol use: Yes    Alcohol/week: 2.0 standard drinks    Types: 2 Glasses  of wine per week   Drug use: No   Sexual activity: Not on file  Other Topics Concern   Not on file  Social History Narrative   Not on file   Social Determinants of Health   Financial Resource Strain: Not on file  Food Insecurity: Not on file  Transportation Needs: Not on file  Physical Activity: Not on file  Stress: Not on file  Social Connections: Not on file     Family History:  The patient's  family history includes Cancer (age of onset: 64) in her mother.   ROS:   Please see the history of present illness.    ROS All other systems reviewed and are negative.   PHYSICAL EXAM:   VS:  BP (!) 150/88   Pulse 72   Ht 5' 3.5" (1.613 m)   Wt 109 lb (49.4 kg)   BMI 19.01 kg/m   Physical Exam   GEN: Thin, looks younger than stated age, in no acute distress  Neck: no JVD, carotid bruits, or masses Cardiac:irreg irreg, no murmurs, rubs, or gallops  Respiratory:  clear to auscultation bilaterally, normal work of breathing GI: soft, nontender, nondistended, + BS Ext: trace right ankle edema, otherwise without cyanosis, clubbing,  Good distal pulses bilaterally Neuro:  Alert and Oriented x 3, Psych: euthymic mood, full affect  Wt Readings from Last 3 Encounters:  05/02/21 109 lb (49.4 kg)  03/30/21 118 lb 9.7 oz (53.8 kg)  05/19/20 125 lb 3.2 oz (56.8 kg)      Studies/Labs Reviewed:   EKG:  EKG is  ordered today.     Recent Labs: 03/18/2021: ALT 37; B Natriuretic Peptide 815.8; Magnesium 2.2; TSH 4.302 03/29/2021: Hemoglobin 9.5; Platelets 328 03/30/2021: BUN 28; Creatinine, Ser 2.22; Potassium 4.1; Sodium 136   Lipid Panel    Component Value Date/Time   CHOL 163 06/06/2020 1035   TRIG 155 (H) 06/06/2020 1035   HDL 47 06/06/2020 1035   CHOLHDL 3.5 06/06/2020 1035   LDLCALC 89 06/06/2020 1035    Additional studies/ records that were reviewed today include:     TTE 03/19/2021: Impressions: 1. Left ventricular ejection fraction, by estimation, is 60 to 65%. The  left ventricle has normal function. The left ventricle has no regional  wall motion abnormalities. There is severe left ventricular hypertrophy.  Left ventricular diastolic function  could not be evaluated.   2. Right ventricular systolic function is normal. The right ventricular  size is normal. There is severely elevated pulmonary artery systolic  pressure. The estimated right ventricular systolic pressure is [id number] mmHg.   3. Left atrial size was moderately dilated.   4. Mobile filamentous structure, which appears attached to the  interatrial septum - consider thrombus or possible myxoma (image 67/68) -  measuring ~0.6 x 2.0 cm. Right atrial size was mildly dilated.   5. The mitral valve is abnormal. Mild  mitral valve regurgitation.   6. The aortic valve is tricuspid. Aortic valve regurgitation is mild.  Mild aortic valve sclerosis is present, with no evidence of aortic valve  stenosis.   7. The inferior vena cava is dilated in size with >50% respiratory  variability, suggesting right atrial pressure of 8 mmHg.   Comparison(s): Changes from prior study are noted. 10/29/2020: LVEF 60-65%,  mobile RA mass can be seen but not noted in this study. _______________   TEE 03/20/2021: Impressions: 1. Mobile filamentous mass measuring 1.7cm x 0.6cm in the right atrium,  appears attached to eustachian  valve and extends to the interatrial  septum. Suspect likely thrombus but would also recommend checking blood  cultures to evaluate for endocarditis.  Cardioversion deferred given suspected RA thrombus   2. Left ventricular ejection fraction, by estimation, is 60 to 65%. The  left ventricle has normal function. There is severe left ventricular  hypertrophy.   3. Right ventricular systolic function is normal. The right ventricular  size is normal.   4. Left atrial size was mildly dilated. No left atrial/left atrial  appendage thrombus was detected.   5. The mitral valve is normal in structure. Mild mitral valve  regurgitation.   6. The aortic valve is tricuspid. Aortic valve regurgitation is mild.  Mild aortic valve sclerosis is present, with no evidence of aortic valve  stenosis.   7. Pulmonic valve regurgitation is mild to moderate.  Risk Assessment/Calculations:    CHA2DS2-VASc Score = 5  This indicates a 7.2% annual risk of stroke. The patient's score is based upon: CHF History: No HTN History: Yes Diabetes History: No Stroke History: No Vascular Disease History: Yes Age Score: 2 Gender Score: 1        ASSESSMENT:    1. Paroxysmal atrial fibrillation (HCC)   2. Right atrial mass   3. Chronic diastolic CHF (congestive heart failure) ([place])   4. Coronary artery disease  involving native coronary artery of native heart without angina pectoris   5. Essential hypertension      PLAN:  In order of problems listed above:  Atrial fibrillation with RVR found to have right atrial thrombus so opted for rate control and started on Eliquis 2.5 mg bid. Rate controlled on metoprolol and diltiazem  Right atrial thrombus on Eliquis plan for repeat echo 4 to 6 weeks VQ scan no PE lower extremity venous Dopplers no DVT-will order echo  Diastolic CHF likely secondary to A. fib with RVR Echo showed LVEF 60 to 65%-trace ankle edema overall compensated on low dose lasix  Nonobstructive CAD on cardiac cath 2006 demand ischemia in the hospital in the setting of A. fib with RVR-no chest pain  Hypertension BP up a little today but has been better at home. 2 gm sodium diet.  CKD with AKI creatinine up to 5.04 the hospital-down to 2.19 04/17/21 and labs drawn by PCP yest-will try to get.  Shared Decision Making/Informed Consent        Medication Adjustments/Labs and Tests Ordered: Current medicines are reviewed at length with the patient today.  Concerns regarding medicines are outlined above.  Medication changes, Labs and Tests ordered today are listed in the Patient Instructions below. Patient Instructions  Medication Instructions:   Your physician recommends that you continue on your current medications as directed. Please refer to the Current Medication list given to you today.  *If you need a refill on your cardiac medications before your next appointment, please call your pharmacy*   Lab Work: [place]   If you have labs (blood work) drawn today and your tests are completely normal, you will receive your results only by: [place] (if you have MyChart) OR A paper copy in the mail If you have any lab test that is abnormal or we need to change your treatment, we will call you to review the results.   Testing/Procedures:  IN 2 -3 WEEKS. Your  physician has requested that you have an echocardiogram. Echocardiography is a painless test that uses sound waves to create images of your heart. It provides your doctor  with information about the size and shape of your heart and how well your heart's chambers and valves are working. This procedure takes approximately one hour. There are no restrictions for this procedure.   Follow-Up: At [facility], you and your health needs are our priority.  As part of our continuing mission to provide you with exceptional heart care, we have created designated Provider Care Teams.  These Care Teams include your primary Cardiologist (physician) and Advanced Practice Providers (APPs -  Physician Assistants and Nurse Practitioners) who all work together to provide you with the care you need, when you need it.  We recommend signing up for the patient portal called "MyChart".  Sign up information is provided on this After Visit Summary.  MyChart is used to connect with patients for Virtual Visits (Telemedicine).  Patients are able to view lab/test results, encounter notes, upcoming appointments, etc.  Non-urgent messages can be sent to your provider as well.   To learn more about what you can do with MyChart, go to [email or web address].    Your next appointment:   2 month(s)  The format for your next appointment:   In Person  Provider:   You may see Dorris Carnes, MD or one of the following Advanced Practice Providers on your designated Care Team:   Richardson Dopp, PA-C [place], [place]   Other Instructions Low-Sodium Eating Plan Sodium, which is an element that makes up salt, helps you maintain a healthy balance of fluids in your body. Too much sodium can increase your bloodpressure and cause fluid and waste to be held in your body. Your health care provider or dietitian may recommend following this plan if you have high blood pressure (hypertension), kidney disease, liver disease, or heart failure. Eating  less sodium can help lower your blood pressure, reduce swelling, and protect your heart, liver, andkidneys. What are tips for following this plan? Reading food labels The Nutrition Facts label lists the amount of sodium in one serving of the food. If you eat more than one serving, you must multiply the listed amount of sodium by the number of servings. Choose foods with less than 140 mg of sodium per serving. Avoid foods with 300 mg of sodium or more per serving. Shopping  Look for lower-sodium products, often labeled as "low-sodium" or "no salt added." Always check the sodium content, even if foods are labeled as "unsalted" or "no salt added." Buy fresh foods. Avoid canned foods and pre-made or frozen meals. Avoid canned, cured, or processed meats. Buy breads that have less than 80 mg of sodium per slice.  Cooking  Eat more home-cooked food and less restaurant, buffet, and fast food. Avoid adding salt when cooking. Use salt-free seasonings or herbs instead of table salt or sea salt. Check with your health care provider or pharmacist before using salt substitutes. Cook with plant-based oils, such as canola, sunflower, or olive oil.  Meal planning When eating at a restaurant, ask that your food be prepared with less salt or no salt, if possible. Avoid dishes labeled as brined, pickled, cured, smoked, or made with soy sauce, miso, or teriyaki sauce. Avoid foods that contain MSG (monosodium glutamate). MSG is sometimes added to [place] food, bouillon, and some canned foods. Make meals that can be grilled, baked, poached, roasted, or steamed. These are generally made with less sodium. General information Most people on this plan should limit their sodium intake to 1,500-2,000 mg (milligrams) of sodium each day. What foods should I eat? Fruits  Fresh, frozen, or canned fruit. Fruit juice. Vegetables Fresh or frozen vegetables. "No salt added" canned vegetables. "No salt added"tomato sauce  and paste. Low-sodium or reduced-sodium tomato and vegetable juice. Grains Low-sodium cereals, including oats, puffed wheat and rice, and shredded wheat. Low-sodium crackers. Unsalted rice. Unsalted pasta. Low-sodium bread.Whole-grain breads and whole-grain pasta. Meats and other proteins Fresh or frozen (no salt added) meat, poultry, seafood, and fish. Low-sodium canned tuna and salmon. Unsalted nuts. Dried peas, beans, and lentils withoutadded salt. Unsalted canned beans. Eggs. Unsalted nut butters. Dairy Milk. Soy milk. Cheese that is naturally low in sodium, such as ricotta cheese, fresh mozzarella, or Swiss cheese. Low-sodium or reduced-sodium cheese. Creamcheese. Yogurt. Seasonings and condiments Fresh and dried herbs and spices. Salt-free seasonings. Low-sodium mustard and ketchup. Sodium-free salad dressing. Sodium-free light mayonnaise. Fresh orrefrigerated horseradish. Lemon juice. Vinegar. Other foods Homemade, reduced-sodium, or low-sodium soups. Unsalted popcorn and pretzels.Low-salt or salt-free chips. The items listed above may not be a complete list of foods and beverages you can eat. Contact a dietitian for more information. What foods should I avoid? Vegetables Sauerkraut, pickled vegetables, and relishes. Olives. [place] fries. Onion rings. Regular canned vegetables (not low-sodium or reduced-sodium). Regular canned tomato sauce and paste (not low-sodium or reduced-sodium). Regular tomato and vegetable juice (not low-sodium or reduced-sodium). Frozenvegetables in sauces. Grains Instant hot cereals. Bread stuffing, pancake, and biscuit mixes. Croutons. Seasoned rice or pasta mixes. Noodle soup cups. Boxed or frozen macaroni andcheese. Regular salted crackers. Self-rising flour. Meats and other proteins Meat or fish that is salted, canned, smoked, spiced, or pickled. Precooked or cured meat, such as sausages or meat loaves. [place] Salines. Ham. Pepperoni. Hot dogs. Corned beef. Chipped beef.  Salt pork. Jerky. Pickled herring. Anchovies andsardines. Regular canned tuna. Salted nuts. Dairy Processed cheese and cheese spreads. Hard cheeses. Cheese curds. Blue cheese.Feta cheese. String cheese. Regular cottage cheese. Buttermilk. Canned milk. Fats and oils Salted butter. Regular margarine. Ghee. Bacon fat. Seasonings and condiments Onion salt, garlic salt, seasoned salt, table salt, and sea salt. Canned and packaged gravies. Worcestershire sauce. Tartar sauce. Barbecue sauce. Teriyaki sauce. Soy sauce, including reduced-sodium. Steak sauce. Fish sauce. Oyster sauce. Cocktail sauce. Horseradish that you find on the shelf. Regular ketchup and mustard. Meat flavorings and tenderizers. Bouillon cubes. Hot sauce. Pre-made or packaged marinades. Pre-made or packaged taco seasonings. Relishes.Regular salad dressings. Salsa. Other foods Salted popcorn and pretzels. Corn chips and puffs. Potato and tortilla chips.Canned or dried soups. Pizza. Frozen entrees and pot pies. The items listed above may not be a complete list of foods and beverages you should avoid. Contact a dietitian for more information. Summary Eating less sodium can help lower your blood pressure, reduce swelling, and protect your heart, liver, and kidneys. Most people on this plan should limit their sodium intake to 1,500-2,000 mg (milligrams) of sodium each day. Canned, boxed, and frozen foods are high in sodium. Restaurant foods, fast foods, and pizza are also very high in sodium. You also get sodium by adding salt to food. Try to cook at home, eat more fresh fruits and vegetables, and eat less fast food and canned, processed, or prepared foods. This information is not intended to replace advice given to you by your health care provider. Make sure you discuss any questions you have with your healthcare provider. Document Revised: 10/22/2019 Document Reviewed: 08/18/2019 Elsevier Patient Education  2022 [place], Ermalinda Barrios, [place]  05/02/2021 11:51 AM    [place] [id number]  [address], [place], [place]  [address] Phone: [phone]; Fax: [phone]

## 2021-05-01 DIAGNOSIS — Z7901 Long term (current) use of anticoagulants: Secondary | ICD-10-CM | POA: Diagnosis not present

## 2021-05-01 DIAGNOSIS — D72829 Elevated white blood cell count, unspecified: Secondary | ICD-10-CM | POA: Diagnosis not present

## 2021-05-01 DIAGNOSIS — I236 Thrombosis of atrium, auricular appendage, and ventricle as current complications following acute myocardial infarction: Secondary | ICD-10-CM | POA: Diagnosis not present

## 2021-05-01 DIAGNOSIS — I272 Pulmonary hypertension, unspecified: Secondary | ICD-10-CM | POA: Diagnosis not present

## 2021-05-01 DIAGNOSIS — I4891 Unspecified atrial fibrillation: Secondary | ICD-10-CM | POA: Diagnosis not present

## 2021-05-01 DIAGNOSIS — I5031 Acute diastolic (congestive) heart failure: Secondary | ICD-10-CM | POA: Diagnosis not present

## 2021-05-01 DIAGNOSIS — I130 Hypertensive heart and chronic kidney disease with heart failure and stage 1 through stage 4 chronic kidney disease, or unspecified chronic kidney disease: Secondary | ICD-10-CM | POA: Diagnosis not present

## 2021-05-01 DIAGNOSIS — N184 Chronic kidney disease, stage 4 (severe): Secondary | ICD-10-CM | POA: Diagnosis not present

## 2021-05-01 DIAGNOSIS — E782 Mixed hyperlipidemia: Secondary | ICD-10-CM | POA: Diagnosis not present

## 2021-05-02 ENCOUNTER — Other Ambulatory Visit: Payer: Self-pay

## 2021-05-02 ENCOUNTER — Encounter: Payer: Self-pay | Admitting: Physician Assistant

## 2021-05-02 ENCOUNTER — Ambulatory Visit (INDEPENDENT_AMBULATORY_CARE_PROVIDER_SITE_OTHER): Payer: Medicare Other | Admitting: Physician Assistant

## 2021-05-02 VITALS — BP 150/88 | HR 72 | Ht 63.5 in | Wt 109.0 lb

## 2021-05-02 DIAGNOSIS — I272 Pulmonary hypertension, unspecified: Secondary | ICD-10-CM | POA: Diagnosis not present

## 2021-05-02 DIAGNOSIS — I5031 Acute diastolic (congestive) heart failure: Secondary | ICD-10-CM | POA: Diagnosis not present

## 2021-05-02 DIAGNOSIS — Z7901 Long term (current) use of anticoagulants: Secondary | ICD-10-CM | POA: Diagnosis not present

## 2021-05-02 DIAGNOSIS — I2510 Atherosclerotic heart disease of native coronary artery without angina pectoris: Secondary | ICD-10-CM

## 2021-05-02 DIAGNOSIS — I5189 Other ill-defined heart diseases: Secondary | ICD-10-CM | POA: Diagnosis not present

## 2021-05-02 DIAGNOSIS — I10 Essential (primary) hypertension: Secondary | ICD-10-CM | POA: Diagnosis not present

## 2021-05-02 DIAGNOSIS — I5032 Chronic diastolic (congestive) heart failure: Secondary | ICD-10-CM

## 2021-05-02 DIAGNOSIS — E782 Mixed hyperlipidemia: Secondary | ICD-10-CM | POA: Diagnosis not present

## 2021-05-02 DIAGNOSIS — N1832 Chronic kidney disease, stage 3b: Secondary | ICD-10-CM | POA: Diagnosis not present

## 2021-05-02 DIAGNOSIS — I4891 Unspecified atrial fibrillation: Secondary | ICD-10-CM | POA: Diagnosis not present

## 2021-05-02 DIAGNOSIS — I480 Paroxysmal atrial fibrillation: Secondary | ICD-10-CM | POA: Diagnosis not present

## 2021-05-02 DIAGNOSIS — I236 Thrombosis of atrium, auricular appendage, and ventricle as current complications following acute myocardial infarction: Secondary | ICD-10-CM | POA: Diagnosis not present

## 2021-05-02 DIAGNOSIS — I130 Hypertensive heart and chronic kidney disease with heart failure and stage 1 through stage 4 chronic kidney disease, or unspecified chronic kidney disease: Secondary | ICD-10-CM | POA: Diagnosis not present

## 2021-05-02 DIAGNOSIS — I129 Hypertensive chronic kidney disease with stage 1 through stage 4 chronic kidney disease, or unspecified chronic kidney disease: Secondary | ICD-10-CM | POA: Diagnosis not present

## 2021-05-02 DIAGNOSIS — M810 Age-related osteoporosis without current pathological fracture: Secondary | ICD-10-CM | POA: Diagnosis not present

## 2021-05-02 DIAGNOSIS — N184 Chronic kidney disease, stage 4 (severe): Secondary | ICD-10-CM | POA: Diagnosis not present

## 2021-05-02 NOTE — Patient Instructions (Addendum)
Medication Instructions:   Your physician recommends that you continue on your current medications as directed. Please refer to the Current Medication list given to you today.  *If you need a refill on your cardiac medications before your next appointment, please call your pharmacy*   Lab Work: [place]   If you have labs (blood work) drawn today and your tests are completely normal, you will receive your results only by: [place] (if you have MyChart) OR A paper copy in the mail If you have any lab test that is abnormal or we need to change your treatment, we will call you to review the results.   Testing/Procedures:  IN 2 -3 WEEKS.Your physician has requested that you have an echocardiogram. Echocardiography is a painless test that uses sound waves to create images of your heart. It provides your doctor with information about the size and shape of your heart and how well your heart's chambers and valves are working. This procedure takes approximately one hour. There are no restrictions for this procedure.   Follow-Up: At [facility], you and your health needs are our priority.  As part of our continuing mission to provide you with exceptional heart care, we have created designated Provider Care Teams.  These Care Teams include your primary Cardiologist (physician) and Advanced Practice Providers (APPs -  Physician Assistants and Nurse Practitioners) who all work together to provide you with the care you need, when you need it.  We recommend signing up for the patient portal called "MyChart".  Sign up information is provided on this After Visit Summary.  MyChart is used to connect with patients for Virtual Visits (Telemedicine).  Patients are able to view lab/test results, encounter notes, upcoming appointments, etc.  Non-urgent messages can be sent to your provider as well.   To learn more about what you can do with MyChart, go to [email or web address].    Your next  appointment:   2 month(s)  The format for your next appointment:   In Person  Provider:   You may see Dorris Carnes, MD or one of the following Advanced Practice Providers on your designated Care Team:   Richardson Dopp, PA-C [place], [place]   Other Instructions Low-Sodium Eating Plan Sodium, which is an element that makes up salt, helps you maintain a healthy balance of fluids in your body. Too much sodium can increase your bloodpressure and cause fluid and waste to be held in your body. Your health care provider or dietitian may recommend following this plan if you have high blood pressure (hypertension), kidney disease, liver disease, or heart failure. Eating less sodium can help lower your blood pressure, reduce swelling, and protect your heart, liver, andkidneys. What are tips for following this plan? Reading food labels The Nutrition Facts label lists the amount of sodium in one serving of the food. If you eat more than one serving, you must multiply the listed amount of sodium by the number of servings. Choose foods with less than 140 mg of sodium per serving. Avoid foods with 300 mg of sodium or more per serving. Shopping  Look for lower-sodium products, often labeled as "low-sodium" or "no salt added." Always check the sodium content, even if foods are labeled as "unsalted" or "no salt added." Buy fresh foods. Avoid canned foods and pre-made or frozen meals. Avoid canned, cured, or processed meats. Buy breads that have less than 80 mg of sodium per slice.  Cooking  Eat more home-cooked food and  less restaurant, buffet, and fast food. Avoid adding salt when cooking. Use salt-free seasonings or herbs instead of table salt or sea salt. Check with your health care provider or pharmacist before using salt substitutes. Cook with plant-based oils, such as canola, sunflower, or olive oil.  Meal planning When eating at a restaurant, ask that your food be prepared with less salt or no  salt, if possible. Avoid dishes labeled as brined, pickled, cured, smoked, or made with soy sauce, miso, or teriyaki sauce. Avoid foods that contain MSG (monosodium glutamate). MSG is sometimes added to [place] food, bouillon, and some canned foods. Make meals that can be grilled, baked, poached, roasted, or steamed. These are generally made with less sodium. General information Most people on this plan should limit their sodium intake to 1,500-2,000 mg (milligrams) of sodium each day. What foods should I eat? Fruits Fresh, frozen, or canned fruit. Fruit juice. Vegetables Fresh or frozen vegetables. "No salt added" canned vegetables. "No salt added"tomato sauce and paste. Low-sodium or reduced-sodium tomato and vegetable juice. Grains Low-sodium cereals, including oats, puffed wheat and rice, and shredded wheat. Low-sodium crackers. Unsalted rice. Unsalted pasta. Low-sodium bread.Whole-grain breads and whole-grain pasta. Meats and other proteins Fresh or frozen (no salt added) meat, poultry, seafood, and fish. Low-sodium canned tuna and salmon. Unsalted nuts. Dried peas, beans, and lentils withoutadded salt. Unsalted canned beans. Eggs. Unsalted nut butters. Dairy Milk. Soy milk. Cheese that is naturally low in sodium, such as ricotta cheese, fresh mozzarella, or Swiss cheese. Low-sodium or reduced-sodium cheese. Creamcheese. Yogurt. Seasonings and condiments Fresh and dried herbs and spices. Salt-free seasonings. Low-sodium mustard and ketchup. Sodium-free salad dressing. Sodium-free light mayonnaise. Fresh orrefrigerated horseradish. Lemon juice. Vinegar. Other foods Homemade, reduced-sodium, or low-sodium soups. Unsalted popcorn and pretzels.Low-salt or salt-free chips. The items listed above may not be a complete list of foods and beverages you can eat. Contact a dietitian for more information. What foods should I avoid? Vegetables Sauerkraut, pickled vegetables, and relishes. Olives.  [place] fries. Onion rings. Regular canned vegetables (not low-sodium or reduced-sodium). Regular canned tomato sauce and paste (not low-sodium or reduced-sodium). Regular tomato and vegetable juice (not low-sodium or reduced-sodium). Frozenvegetables in sauces. Grains Instant hot cereals. Bread stuffing, pancake, and biscuit mixes. Croutons. Seasoned rice or pasta mixes. Noodle soup cups. Boxed or frozen macaroni andcheese. Regular salted crackers. Self-rising flour. Meats and other proteins Meat or fish that is salted, canned, smoked, spiced, or pickled. Precooked or cured meat, such as sausages or meat loaves. Berniece Salines. Ham. Pepperoni. Hot dogs. Corned beef. Chipped beef. Salt pork. Jerky. Pickled herring. Anchovies andsardines. Regular canned tuna. Salted nuts. Dairy Processed cheese and cheese spreads. Hard cheeses. Cheese curds. Blue cheese.Feta cheese. String cheese. Regular cottage cheese. Buttermilk. Canned milk. Fats and oils Salted butter. Regular margarine. Ghee. Bacon fat. Seasonings and condiments Onion salt, garlic salt, seasoned salt, table salt, and sea salt. Canned and packaged gravies. Worcestershire sauce. Tartar sauce. Barbecue sauce. Teriyaki sauce. Soy sauce, including reduced-sodium. Steak sauce. Fish sauce. Oyster sauce. Cocktail sauce. Horseradish that you find on the shelf. Regular ketchup and mustard. Meat flavorings and tenderizers. Bouillon cubes. Hot sauce. Pre-made or packaged marinades. Pre-made or packaged taco seasonings. Relishes.Regular salad dressings. Salsa. Other foods Salted popcorn and pretzels. Corn chips and puffs. Potato and tortilla chips.Canned or dried soups. Pizza. Frozen entrees and pot pies. The items listed above may not be a complete list of foods and beverages you should avoid. Contact a dietitian for more information. Summary Eating less  sodium can help lower your blood pressure, reduce swelling, and protect your heart, liver, and kidneys. Most  people on this plan should limit their sodium intake to 1,500-2,000 mg (milligrams) of sodium each day. Canned, boxed, and frozen foods are high in sodium. Restaurant foods, fast foods, and pizza are also very high in sodium. You also get sodium by adding salt to food. Try to cook at home, eat more fresh fruits and vegetables, and eat less fast food and canned, processed, or prepared foods. This information is not intended to replace advice given to you by your health care provider. Make sure you discuss any questions you have with your healthcare provider. Document Revised: 10/22/2019 Document Reviewed: 08/18/2019 Elsevier Patient Education  2022 [employer].

## 2021-05-07 DIAGNOSIS — Z7901 Long term (current) use of anticoagulants: Secondary | ICD-10-CM | POA: Diagnosis not present

## 2021-05-07 DIAGNOSIS — I236 Thrombosis of atrium, auricular appendage, and ventricle as current complications following acute myocardial infarction: Secondary | ICD-10-CM | POA: Diagnosis not present

## 2021-05-07 DIAGNOSIS — I130 Hypertensive heart and chronic kidney disease with heart failure and stage 1 through stage 4 chronic kidney disease, or unspecified chronic kidney disease: Secondary | ICD-10-CM | POA: Diagnosis not present

## 2021-05-07 DIAGNOSIS — N184 Chronic kidney disease, stage 4 (severe): Secondary | ICD-10-CM | POA: Diagnosis not present

## 2021-05-07 DIAGNOSIS — I4891 Unspecified atrial fibrillation: Secondary | ICD-10-CM | POA: Diagnosis not present

## 2021-05-07 DIAGNOSIS — I5031 Acute diastolic (congestive) heart failure: Secondary | ICD-10-CM | POA: Diagnosis not present

## 2021-05-07 DIAGNOSIS — E782 Mixed hyperlipidemia: Secondary | ICD-10-CM | POA: Diagnosis not present

## 2021-05-07 DIAGNOSIS — I272 Pulmonary hypertension, unspecified: Secondary | ICD-10-CM | POA: Diagnosis not present

## 2021-05-09 DIAGNOSIS — I5031 Acute diastolic (congestive) heart failure: Secondary | ICD-10-CM | POA: Diagnosis not present

## 2021-05-09 DIAGNOSIS — I236 Thrombosis of atrium, auricular appendage, and ventricle as current complications following acute myocardial infarction: Secondary | ICD-10-CM | POA: Diagnosis not present

## 2021-05-09 DIAGNOSIS — I130 Hypertensive heart and chronic kidney disease with heart failure and stage 1 through stage 4 chronic kidney disease, or unspecified chronic kidney disease: Secondary | ICD-10-CM | POA: Diagnosis not present

## 2021-05-09 DIAGNOSIS — I272 Pulmonary hypertension, unspecified: Secondary | ICD-10-CM | POA: Diagnosis not present

## 2021-05-09 DIAGNOSIS — E782 Mixed hyperlipidemia: Secondary | ICD-10-CM | POA: Diagnosis not present

## 2021-05-09 DIAGNOSIS — N184 Chronic kidney disease, stage 4 (severe): Secondary | ICD-10-CM | POA: Diagnosis not present

## 2021-05-09 DIAGNOSIS — Z7901 Long term (current) use of anticoagulants: Secondary | ICD-10-CM | POA: Diagnosis not present

## 2021-05-09 DIAGNOSIS — I4891 Unspecified atrial fibrillation: Secondary | ICD-10-CM | POA: Diagnosis not present

## 2021-05-10 DIAGNOSIS — I5031 Acute diastolic (congestive) heart failure: Secondary | ICD-10-CM | POA: Diagnosis not present

## 2021-05-10 DIAGNOSIS — I4891 Unspecified atrial fibrillation: Secondary | ICD-10-CM | POA: Diagnosis not present

## 2021-05-10 DIAGNOSIS — E782 Mixed hyperlipidemia: Secondary | ICD-10-CM | POA: Diagnosis not present

## 2021-05-10 DIAGNOSIS — I130 Hypertensive heart and chronic kidney disease with heart failure and stage 1 through stage 4 chronic kidney disease, or unspecified chronic kidney disease: Secondary | ICD-10-CM | POA: Diagnosis not present

## 2021-05-10 DIAGNOSIS — I272 Pulmonary hypertension, unspecified: Secondary | ICD-10-CM | POA: Diagnosis not present

## 2021-05-10 DIAGNOSIS — Z7901 Long term (current) use of anticoagulants: Secondary | ICD-10-CM | POA: Diagnosis not present

## 2021-05-10 DIAGNOSIS — N184 Chronic kidney disease, stage 4 (severe): Secondary | ICD-10-CM | POA: Diagnosis not present

## 2021-05-10 DIAGNOSIS — I236 Thrombosis of atrium, auricular appendage, and ventricle as current complications following acute myocardial infarction: Secondary | ICD-10-CM | POA: Diagnosis not present

## 2021-05-16 ENCOUNTER — Ambulatory Visit (HOSPITAL_COMMUNITY): Payer: Medicare Other | Attending: Cardiology

## 2021-05-16 ENCOUNTER — Other Ambulatory Visit: Payer: Self-pay

## 2021-05-16 DIAGNOSIS — I5189 Other ill-defined heart diseases: Secondary | ICD-10-CM | POA: Diagnosis not present

## 2021-05-16 LAB — ECHOCARDIOGRAM COMPLETE
MV M vel: 6.44 m/s
MV Peak grad: 165.9 mmHg
P 1/2 time: 503 msec
Radius: 0.4 cm
S' Lateral: 1.9 cm

## 2021-05-17 DIAGNOSIS — E782 Mixed hyperlipidemia: Secondary | ICD-10-CM | POA: Diagnosis not present

## 2021-05-17 DIAGNOSIS — N184 Chronic kidney disease, stage 4 (severe): Secondary | ICD-10-CM | POA: Diagnosis not present

## 2021-05-17 DIAGNOSIS — I130 Hypertensive heart and chronic kidney disease with heart failure and stage 1 through stage 4 chronic kidney disease, or unspecified chronic kidney disease: Secondary | ICD-10-CM | POA: Diagnosis not present

## 2021-05-17 DIAGNOSIS — I5031 Acute diastolic (congestive) heart failure: Secondary | ICD-10-CM | POA: Diagnosis not present

## 2021-05-17 DIAGNOSIS — I4891 Unspecified atrial fibrillation: Secondary | ICD-10-CM | POA: Diagnosis not present

## 2021-05-17 DIAGNOSIS — I272 Pulmonary hypertension, unspecified: Secondary | ICD-10-CM | POA: Diagnosis not present

## 2021-05-17 DIAGNOSIS — I236 Thrombosis of atrium, auricular appendage, and ventricle as current complications following acute myocardial infarction: Secondary | ICD-10-CM | POA: Diagnosis not present

## 2021-05-17 DIAGNOSIS — Z7901 Long term (current) use of anticoagulants: Secondary | ICD-10-CM | POA: Diagnosis not present

## 2021-05-18 DIAGNOSIS — I272 Pulmonary hypertension, unspecified: Secondary | ICD-10-CM | POA: Diagnosis not present

## 2021-05-18 DIAGNOSIS — Z7901 Long term (current) use of anticoagulants: Secondary | ICD-10-CM | POA: Diagnosis not present

## 2021-05-18 DIAGNOSIS — E782 Mixed hyperlipidemia: Secondary | ICD-10-CM | POA: Diagnosis not present

## 2021-05-18 DIAGNOSIS — I236 Thrombosis of atrium, auricular appendage, and ventricle as current complications following acute myocardial infarction: Secondary | ICD-10-CM | POA: Diagnosis not present

## 2021-05-18 DIAGNOSIS — I5031 Acute diastolic (congestive) heart failure: Secondary | ICD-10-CM | POA: Diagnosis not present

## 2021-05-18 DIAGNOSIS — I130 Hypertensive heart and chronic kidney disease with heart failure and stage 1 through stage 4 chronic kidney disease, or unspecified chronic kidney disease: Secondary | ICD-10-CM | POA: Diagnosis not present

## 2021-05-18 DIAGNOSIS — I4891 Unspecified atrial fibrillation: Secondary | ICD-10-CM | POA: Diagnosis not present

## 2021-05-18 DIAGNOSIS — N184 Chronic kidney disease, stage 4 (severe): Secondary | ICD-10-CM | POA: Diagnosis not present

## 2021-05-23 DIAGNOSIS — I236 Thrombosis of atrium, auricular appendage, and ventricle as current complications following acute myocardial infarction: Secondary | ICD-10-CM | POA: Diagnosis not present

## 2021-05-23 DIAGNOSIS — I130 Hypertensive heart and chronic kidney disease with heart failure and stage 1 through stage 4 chronic kidney disease, or unspecified chronic kidney disease: Secondary | ICD-10-CM | POA: Diagnosis not present

## 2021-05-23 DIAGNOSIS — I272 Pulmonary hypertension, unspecified: Secondary | ICD-10-CM | POA: Diagnosis not present

## 2021-05-23 DIAGNOSIS — I4891 Unspecified atrial fibrillation: Secondary | ICD-10-CM | POA: Diagnosis not present

## 2021-05-23 DIAGNOSIS — I5031 Acute diastolic (congestive) heart failure: Secondary | ICD-10-CM | POA: Diagnosis not present

## 2021-05-23 DIAGNOSIS — E782 Mixed hyperlipidemia: Secondary | ICD-10-CM | POA: Diagnosis not present

## 2021-05-23 DIAGNOSIS — N184 Chronic kidney disease, stage 4 (severe): Secondary | ICD-10-CM | POA: Diagnosis not present

## 2021-05-23 DIAGNOSIS — Z7901 Long term (current) use of anticoagulants: Secondary | ICD-10-CM | POA: Diagnosis not present

## 2021-05-24 DIAGNOSIS — I236 Thrombosis of atrium, auricular appendage, and ventricle as current complications following acute myocardial infarction: Secondary | ICD-10-CM | POA: Diagnosis not present

## 2021-05-24 DIAGNOSIS — Z7901 Long term (current) use of anticoagulants: Secondary | ICD-10-CM | POA: Diagnosis not present

## 2021-05-24 DIAGNOSIS — I5031 Acute diastolic (congestive) heart failure: Secondary | ICD-10-CM | POA: Diagnosis not present

## 2021-05-24 DIAGNOSIS — N184 Chronic kidney disease, stage 4 (severe): Secondary | ICD-10-CM | POA: Diagnosis not present

## 2021-05-24 DIAGNOSIS — E782 Mixed hyperlipidemia: Secondary | ICD-10-CM | POA: Diagnosis not present

## 2021-05-24 DIAGNOSIS — I130 Hypertensive heart and chronic kidney disease with heart failure and stage 1 through stage 4 chronic kidney disease, or unspecified chronic kidney disease: Secondary | ICD-10-CM | POA: Diagnosis not present

## 2021-05-24 DIAGNOSIS — I272 Pulmonary hypertension, unspecified: Secondary | ICD-10-CM | POA: Diagnosis not present

## 2021-05-24 DIAGNOSIS — I4891 Unspecified atrial fibrillation: Secondary | ICD-10-CM | POA: Diagnosis not present

## 2021-05-25 DIAGNOSIS — I272 Pulmonary hypertension, unspecified: Secondary | ICD-10-CM | POA: Diagnosis not present

## 2021-05-25 DIAGNOSIS — I130 Hypertensive heart and chronic kidney disease with heart failure and stage 1 through stage 4 chronic kidney disease, or unspecified chronic kidney disease: Secondary | ICD-10-CM | POA: Diagnosis not present

## 2021-05-25 DIAGNOSIS — Z7901 Long term (current) use of anticoagulants: Secondary | ICD-10-CM | POA: Diagnosis not present

## 2021-05-25 DIAGNOSIS — E782 Mixed hyperlipidemia: Secondary | ICD-10-CM | POA: Diagnosis not present

## 2021-05-25 DIAGNOSIS — I236 Thrombosis of atrium, auricular appendage, and ventricle as current complications following acute myocardial infarction: Secondary | ICD-10-CM | POA: Diagnosis not present

## 2021-05-25 DIAGNOSIS — N184 Chronic kidney disease, stage 4 (severe): Secondary | ICD-10-CM | POA: Diagnosis not present

## 2021-05-25 DIAGNOSIS — I4891 Unspecified atrial fibrillation: Secondary | ICD-10-CM | POA: Diagnosis not present

## 2021-05-25 DIAGNOSIS — I5031 Acute diastolic (congestive) heart failure: Secondary | ICD-10-CM | POA: Diagnosis not present

## 2021-05-30 DIAGNOSIS — N184 Chronic kidney disease, stage 4 (severe): Secondary | ICD-10-CM | POA: Diagnosis not present

## 2021-05-30 DIAGNOSIS — Z7901 Long term (current) use of anticoagulants: Secondary | ICD-10-CM | POA: Diagnosis not present

## 2021-05-30 DIAGNOSIS — E782 Mixed hyperlipidemia: Secondary | ICD-10-CM | POA: Diagnosis not present

## 2021-05-30 DIAGNOSIS — I272 Pulmonary hypertension, unspecified: Secondary | ICD-10-CM | POA: Diagnosis not present

## 2021-05-30 DIAGNOSIS — I236 Thrombosis of atrium, auricular appendage, and ventricle as current complications following acute myocardial infarction: Secondary | ICD-10-CM | POA: Diagnosis not present

## 2021-05-30 DIAGNOSIS — I5031 Acute diastolic (congestive) heart failure: Secondary | ICD-10-CM | POA: Diagnosis not present

## 2021-05-30 DIAGNOSIS — I4891 Unspecified atrial fibrillation: Secondary | ICD-10-CM | POA: Diagnosis not present

## 2021-05-30 DIAGNOSIS — I130 Hypertensive heart and chronic kidney disease with heart failure and stage 1 through stage 4 chronic kidney disease, or unspecified chronic kidney disease: Secondary | ICD-10-CM | POA: Diagnosis not present

## 2021-05-31 ENCOUNTER — Inpatient Hospital Stay (HOSPITAL_COMMUNITY)
Admission: EM | Admit: 2021-05-31 | Discharge: 2021-06-17 | DRG: 064 | Disposition: A | Discharge status: Hospice | Payer: Medicare Other | Attending: Internal Medicine | Admitting: Internal Medicine

## 2021-05-31 ENCOUNTER — Encounter (HOSPITAL_COMMUNITY): Payer: Self-pay | Admitting: Internal Medicine

## 2021-05-31 ENCOUNTER — Observation Stay (HOSPITAL_COMMUNITY): Payer: Medicare Other

## 2021-05-31 ENCOUNTER — Emergency Department (HOSPITAL_COMMUNITY): Payer: Medicare Other

## 2021-05-31 ENCOUNTER — Other Ambulatory Visit: Payer: Self-pay

## 2021-05-31 DIAGNOSIS — N179 Acute kidney failure, unspecified: Secondary | ICD-10-CM | POA: Diagnosis not present

## 2021-05-31 DIAGNOSIS — Z7901 Long term (current) use of anticoagulants: Secondary | ICD-10-CM | POA: Diagnosis not present

## 2021-05-31 DIAGNOSIS — R54 Age-related physical debility: Secondary | ICD-10-CM | POA: Diagnosis not present

## 2021-05-31 DIAGNOSIS — I6389 Other cerebral infarction: Secondary | ICD-10-CM | POA: Diagnosis not present

## 2021-05-31 DIAGNOSIS — I130 Hypertensive heart and chronic kidney disease with heart failure and stage 1 through stage 4 chronic kidney disease, or unspecified chronic kidney disease: Secondary | ICD-10-CM | POA: Diagnosis not present

## 2021-05-31 DIAGNOSIS — I272 Pulmonary hypertension, unspecified: Secondary | ICD-10-CM | POA: Diagnosis not present

## 2021-05-31 DIAGNOSIS — G9341 Metabolic encephalopathy: Secondary | ICD-10-CM | POA: Diagnosis not present

## 2021-05-31 DIAGNOSIS — I252 Old myocardial infarction: Secondary | ICD-10-CM | POA: Diagnosis not present

## 2021-05-31 DIAGNOSIS — E872 Acidosis: Secondary | ICD-10-CM | POA: Diagnosis not present

## 2021-05-31 DIAGNOSIS — N250 Renal osteodystrophy: Secondary | ICD-10-CM | POA: Diagnosis not present

## 2021-05-31 DIAGNOSIS — I4819 Other persistent atrial fibrillation: Secondary | ICD-10-CM | POA: Diagnosis present

## 2021-05-31 DIAGNOSIS — I5032 Chronic diastolic (congestive) heart failure: Secondary | ICD-10-CM | POA: Diagnosis not present

## 2021-05-31 DIAGNOSIS — I10 Essential (primary) hypertension: Secondary | ICD-10-CM | POA: Diagnosis not present

## 2021-05-31 DIAGNOSIS — E43 Unspecified severe protein-calorie malnutrition: Secondary | ICD-10-CM | POA: Diagnosis not present

## 2021-05-31 DIAGNOSIS — I6340 Cerebral infarction due to embolism of unspecified cerebral artery: Principal | ICD-10-CM | POA: Diagnosis present

## 2021-05-31 DIAGNOSIS — I4821 Permanent atrial fibrillation: Secondary | ICD-10-CM | POA: Diagnosis not present

## 2021-05-31 DIAGNOSIS — N281 Cyst of kidney, acquired: Secondary | ICD-10-CM | POA: Diagnosis not present

## 2021-05-31 DIAGNOSIS — R4182 Altered mental status, unspecified: Secondary | ICD-10-CM

## 2021-05-31 DIAGNOSIS — G9349 Other encephalopathy: Secondary | ICD-10-CM | POA: Diagnosis not present

## 2021-05-31 DIAGNOSIS — R627 Adult failure to thrive: Secondary | ICD-10-CM | POA: Diagnosis present

## 2021-05-31 DIAGNOSIS — I5031 Acute diastolic (congestive) heart failure: Secondary | ICD-10-CM | POA: Diagnosis not present

## 2021-05-31 DIAGNOSIS — I482 Chronic atrial fibrillation, unspecified: Secondary | ICD-10-CM | POA: Diagnosis not present

## 2021-05-31 DIAGNOSIS — G9340 Encephalopathy, unspecified: Secondary | ICD-10-CM | POA: Diagnosis present

## 2021-05-31 DIAGNOSIS — I5033 Acute on chronic diastolic (congestive) heart failure: Secondary | ICD-10-CM | POA: Diagnosis present

## 2021-05-31 DIAGNOSIS — H9190 Unspecified hearing loss, unspecified ear: Secondary | ICD-10-CM | POA: Diagnosis not present

## 2021-05-31 DIAGNOSIS — D631 Anemia in chronic kidney disease: Secondary | ICD-10-CM | POA: Diagnosis not present

## 2021-05-31 DIAGNOSIS — Z888 Allergy status to other drugs, medicaments and biological substances status: Secondary | ICD-10-CM

## 2021-05-31 DIAGNOSIS — Z66 Do not resuscitate: Secondary | ICD-10-CM | POA: Diagnosis not present

## 2021-05-31 DIAGNOSIS — I132 Hypertensive heart and chronic kidney disease with heart failure and with stage 5 chronic kidney disease, or end stage renal disease: Secondary | ICD-10-CM | POA: Diagnosis present

## 2021-05-31 DIAGNOSIS — N184 Chronic kidney disease, stage 4 (severe): Secondary | ICD-10-CM | POA: Diagnosis not present

## 2021-05-31 DIAGNOSIS — E785 Hyperlipidemia, unspecified: Secondary | ICD-10-CM | POA: Diagnosis not present

## 2021-05-31 DIAGNOSIS — I63312 Cerebral infarction due to thrombosis of left middle cerebral artery: Secondary | ICD-10-CM | POA: Diagnosis not present

## 2021-05-31 DIAGNOSIS — L89321 Pressure ulcer of left buttock, stage 1: Secondary | ICD-10-CM | POA: Diagnosis not present

## 2021-05-31 DIAGNOSIS — R29703 NIHSS score 3: Secondary | ICD-10-CM | POA: Diagnosis present

## 2021-05-31 DIAGNOSIS — I639 Cerebral infarction, unspecified: Secondary | ICD-10-CM

## 2021-05-31 DIAGNOSIS — R0602 Shortness of breath: Secondary | ICD-10-CM | POA: Diagnosis not present

## 2021-05-31 DIAGNOSIS — R531 Weakness: Secondary | ICD-10-CM | POA: Diagnosis not present

## 2021-05-31 DIAGNOSIS — I509 Heart failure, unspecified: Secondary | ICD-10-CM | POA: Diagnosis not present

## 2021-05-31 DIAGNOSIS — R471 Dysarthria and anarthria: Secondary | ICD-10-CM | POA: Diagnosis present

## 2021-05-31 DIAGNOSIS — N185 Chronic kidney disease, stage 5: Secondary | ICD-10-CM | POA: Diagnosis present

## 2021-05-31 DIAGNOSIS — D649 Anemia, unspecified: Secondary | ICD-10-CM | POA: Diagnosis not present

## 2021-05-31 DIAGNOSIS — Z681 Body mass index (BMI) 19 or less, adult: Secondary | ICD-10-CM

## 2021-05-31 DIAGNOSIS — I4891 Unspecified atrial fibrillation: Secondary | ICD-10-CM | POA: Diagnosis not present

## 2021-05-31 DIAGNOSIS — Z515 Encounter for palliative care: Secondary | ICD-10-CM

## 2021-05-31 DIAGNOSIS — Z809 Family history of malignant neoplasm, unspecified: Secondary | ICD-10-CM

## 2021-05-31 DIAGNOSIS — Z79899 Other long term (current) drug therapy: Secondary | ICD-10-CM

## 2021-05-31 DIAGNOSIS — E782 Mixed hyperlipidemia: Secondary | ICD-10-CM | POA: Diagnosis not present

## 2021-05-31 DIAGNOSIS — I2510 Atherosclerotic heart disease of native coronary artery without angina pectoris: Secondary | ICD-10-CM | POA: Diagnosis not present

## 2021-05-31 DIAGNOSIS — R4701 Aphasia: Principal | ICD-10-CM | POA: Diagnosis present

## 2021-05-31 DIAGNOSIS — L8990 Pressure ulcer of unspecified site, unspecified stage: Secondary | ICD-10-CM | POA: Insufficient documentation

## 2021-05-31 DIAGNOSIS — K5900 Constipation, unspecified: Secondary | ICD-10-CM | POA: Diagnosis not present

## 2021-05-31 DIAGNOSIS — Z7189 Other specified counseling: Secondary | ICD-10-CM | POA: Diagnosis not present

## 2021-05-31 DIAGNOSIS — Z20822 Contact with and (suspected) exposure to covid-19: Secondary | ICD-10-CM | POA: Diagnosis present

## 2021-05-31 DIAGNOSIS — I236 Thrombosis of atrium, auricular appendage, and ventricle as current complications following acute myocardial infarction: Secondary | ICD-10-CM | POA: Diagnosis not present

## 2021-05-31 DIAGNOSIS — L89311 Pressure ulcer of right buttock, stage 1: Secondary | ICD-10-CM | POA: Diagnosis present

## 2021-05-31 DIAGNOSIS — Z7401 Bed confinement status: Secondary | ICD-10-CM | POA: Diagnosis not present

## 2021-05-31 DIAGNOSIS — M810 Age-related osteoporosis without current pathological fracture: Secondary | ICD-10-CM | POA: Diagnosis present

## 2021-05-31 DIAGNOSIS — J90 Pleural effusion, not elsewhere classified: Secondary | ICD-10-CM | POA: Diagnosis not present

## 2021-05-31 DIAGNOSIS — R29818 Other symptoms and signs involving the nervous system: Secondary | ICD-10-CM | POA: Diagnosis not present

## 2021-05-31 LAB — BASIC METABOLIC PANEL
Anion gap: 10 (ref 5–15)
BUN: 38 mg/dL — ABNORMAL HIGH (ref 8–23)
CO2: 27 mmol/L (ref 22–32)
Calcium: 9.8 mg/dL (ref 8.9–10.3)
Chloride: 101 mmol/L (ref 98–111)
Creatinine, Ser: 2.18 mg/dL — ABNORMAL HIGH (ref 0.44–1.00)
GFR, Estimated: 20 mL/min — ABNORMAL LOW (ref >60)
Glucose, Bld: 128 mg/dL — ABNORMAL HIGH (ref 70–99)
Potassium: 4.1 mmol/L (ref 3.5–5.1)
Sodium: 138 mmol/L (ref 135–145)

## 2021-05-31 LAB — CBC WITH DIFFERENTIAL/PLATELET
Abs Immature Granulocytes: 0.03 10*3/uL (ref 0.00–0.07)
Basophils Absolute: 0 10*3/uL (ref 0.0–0.1)
Basophils Relative: 0 %
Eosinophils Absolute: 0.1 10*3/uL (ref 0.0–0.5)
Eosinophils Relative: 1 %
HCT: 40.5 % (ref 36.0–46.0)
Hemoglobin: 12.5 g/dL (ref 12.0–15.0)
Immature Granulocytes: 0 %
Lymphocytes Relative: 45 %
Lymphs Abs: 4.5 10*3/uL — ABNORMAL HIGH (ref 0.7–4.0)
MCH: 27.5 pg (ref 26.0–34.0)
MCHC: 30.9 g/dL (ref 30.0–36.0)
MCV: 89.2 fL (ref 80.0–100.0)
Monocytes Absolute: 0.4 10*3/uL (ref 0.1–1.0)
Monocytes Relative: 4 %
Neutro Abs: 4.9 10*3/uL (ref 1.7–7.7)
Neutrophils Relative %: 50 %
Platelets: 239 10*3/uL (ref 150–400)
RBC: 4.54 MIL/uL (ref 3.87–5.11)
RDW: 14.9 % (ref 11.5–15.5)
WBC: 10 10*3/uL (ref 4.0–10.5)
nRBC: 0 % (ref 0.0–0.2)

## 2021-05-31 LAB — CBG MONITORING, ED: Glucose-Capillary: 110 mg/dL — ABNORMAL HIGH (ref 70–99)

## 2021-05-31 LAB — PROTIME-INR
INR: 1.2 (ref 0.8–1.2)
Prothrombin Time: 15.2 seconds (ref 11.4–15.2)

## 2021-05-31 LAB — APTT: aPTT: 34 seconds (ref 24–36)

## 2021-05-31 MED ORDER — EZETIMIBE 10 MG PO TABS
10.0000 mg | ORAL_TABLET | Freq: Every day | ORAL | Status: DC
  Administered 2021-06-02 – 2021-06-14 (×13): 10 mg via ORAL
  Filled 2021-05-31 (×14): qty 1

## 2021-05-31 MED ORDER — LORAZEPAM 2 MG/ML IJ SOLN
1.0000 mg | INTRAMUSCULAR | Status: DC | PRN
  Administered 2021-05-31: 1 mg via INTRAVENOUS
  Filled 2021-05-31: qty 1

## 2021-05-31 MED ORDER — ACETAMINOPHEN 160 MG/5ML PO SOLN: 650.0000 mg | ORAL | Status: DC | PRN

## 2021-05-31 MED ORDER — SODIUM CHLORIDE 0.9% FLUSH
3.0000 mL | Freq: Once | INTRAVENOUS | Status: DC
Start: 2021-05-31 — End: 2021-05-31

## 2021-05-31 MED ORDER — ATORVASTATIN CALCIUM 40 MG PO TABS
40.0000 mg | ORAL_TABLET | Freq: Every day | ORAL | Status: DC
  Administered 2021-06-02 – 2021-06-14 (×13): 40 mg via ORAL
  Filled 2021-05-31 (×14): qty 1

## 2021-05-31 MED ORDER — SODIUM CHLORIDE 0.9 % IV SOLN: INTRAVENOUS | Status: AC

## 2021-05-31 MED ORDER — DILTIAZEM HCL ER COATED BEADS 120 MG PO CP24: 120.0000 mg | ORAL_CAPSULE | Freq: Every day | ORAL | Status: DC

## 2021-05-31 MED ORDER — POLYETHYLENE GLYCOL 3350 17 G PO PACK
17.0000 g | PACK | Freq: Every day | ORAL | Status: DC | PRN
  Administered 2021-06-03 – 2021-06-10 (×2): 17 g via ORAL
  Filled 2021-05-31 (×2): qty 1

## 2021-05-31 MED ORDER — METOPROLOL TARTRATE 50 MG PO TABS: 100.0000 mg | ORAL_TABLET | Freq: Two times a day (BID) | ORAL | Status: DC

## 2021-05-31 MED ORDER — ACETAMINOPHEN 650 MG RE SUPP: 650.0000 mg | RECTAL | Status: DC | PRN

## 2021-05-31 MED ORDER — STROKE: EARLY STAGES OF RECOVERY BOOK
Freq: Once | Status: DC
  Filled 2021-05-31: qty 1

## 2021-05-31 MED ORDER — ACETAMINOPHEN 325 MG PO TABS
650.0000 mg | ORAL_TABLET | ORAL | Status: DC | PRN
  Filled 2021-05-31: qty 2

## 2021-05-31 NOTE — ED Provider Notes (Addendum)
[facility] EMERGENCY DEPARTMENT Provider Note   CSN: [id number] Arrival date & time: 05/31/21  1607     History Chief Complaint  Patient presents with   Memory Loss    Heather Saunders is a 87 y.o. female.  The history is provided by the patient, a relative and medical records. No language interpreter was used.     87 year old female significant history of CKD, afib on Eliquis, coronary disease, hyperlipidemia brought in by family member with concerns of altered mental status.  History obtained through nephew who is at bedside.  Per nephew, patient was last known normal yesterday when she was at her usual baseline, communicating without any difficulty.  Today her home health nurse who visit her noted that patient was confused and having trouble formulating sentences.  This is unusual for her and patient was brought here for further evaluation.  She did not take her home medication this morning.  When I talked to patient, she denies having any active symptoms.  She denies having headache, vision changes, trouble speaking, chest pain, trouble breathing, abdominal pain, back pain, dysuria, focal numbness or focal weakness.  No recent change in medication.  Patient was last admitted 2 months ago for atrial fibrillation with RVR and was placed on Eliquis.  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  Past Medical History:  Diagnosis Date   Chronic kidney disease    Coronary disease    NonObstructive   History of nuclear stress test    Nuclear stress test 10/18: EF 96, normal perfusion, low risk   Hyperlipidemia    Hypertension    Myocardial infarct [facility]) 2006   Osteopenia    Osteoporosis     Patient Active Problem List   Diagnosis Date Noted   Acute kidney injury ([place])    Pulmonary HTN ([place])    Right atrial mass    Acute diastolic CHF (congestive heart failure) ([place])    Atrial fibrillation with RVR ([place])  03/18/2021   AKI (acute kidney injury) ([place]) 03/18/2021   Essential hypertension 08/03/2018   Coronary artery disease involving native coronary artery of native heart without angina pectoris 08/03/2018   Dyspnea on exertion 08/03/2018   Mixed hyperlipidemia 08/03/2018    Past Surgical History:  Procedure Laterality Date   LIPOMA EXCISION     removed from Shoulder   TEE WITHOUT CARDIOVERSION N/A 03/20/2021   Procedure: TRANSESOPHAGEAL ECHOCARDIOGRAM (TEE);  Surgeon: Donato Heinz, MD;  Location: [facility] ENDOSCOPY;  Service: Cardiovascular;  Laterality: N/A;     OB History   No obstetric history on file.     Family History  Problem Relation Age of Onset   Cancer Mother 36       breast    Social History   Tobacco Use   Smoking status: Never   Smokeless tobacco: Never  Vaping Use   Vaping Use: Never used  Substance Use Topics   Alcohol use: Yes    Alcohol/week: 2.0 standard drinks    Types: 2 Glasses of wine per week   Drug use: No    Home Medications Prior to Admission medications   Medication Sig Start Date End Date Taking? Authorizing Provider  acetaminophen (TYLENOL) 500 MG tablet Take 500 mg by mouth every 6 (six) hours as needed for mild pain.    [provider]  apixaban (ELIQUIS) 2.5 MG TABS tablet Take 1 tablet (2.5 mg total) by mouth 2 (two) times daily.  03/26/21   Leslee Home, DO  atorvastatin (LIPITOR) 40 MG tablet Take 40 mg by mouth daily.    [provider]  Cholecalciferol 100 MCG (4000 UT) CAPS Take 4,000 Units by mouth daily.    [provider]  diltiazem (CARDIZEM CD) 120 MG 24 hr capsule Take 1 capsule (120 mg total) by mouth daily. 03/23/21   Leanor Kail, PA  ezetimibe (ZETIA) 10 MG tablet TAKE 1 TABLET BY MOUTH EVERY DAY 07/31/20   Fay Records, MD  furosemide (LASIX) 20 MG tablet Take 1 tablet (20 mg total) by mouth daily. 03/26/21 05/02/21  Imagene Sheller S, DO  metoprolol tartrate (LOPRESSOR) 100 MG tablet  Take 1 tablet (100 mg total) by mouth 2 (two) times daily. 03/26/21   Leslee Home, DO  Multiple Vitamin (MULTIVITAMIN) capsule Take 1 capsule by mouth daily.    [provider]  polyethylene glycol (MIRALAX / GLYCOLAX) 17 g packet Take 17 g by mouth daily as needed for mild constipation.    [provider]  sodium bicarbonate 650 MG tablet Take 1 tablet (650 mg total) by mouth daily. Patient not taking: Reported on 05/02/2021 03/31/21   Elmarie Shiley, MD    Allergies    Boniva [ibandronic acid] and Fosamax [alendronate sodium]  Review of Systems   Review of Systems  Unable to perform ROS: Mental status change   Physical Exam Updated Vital Signs BP (!) 182/107 (BP Location: Right Arm)   Pulse 90   Temp 97.7 F (36.5 C) (Oral)   Resp 14   SpO2 100%   Physical Exam Vitals and nursing note reviewed.  Constitutional:      General: She is not in acute distress.    Appearance: She is well-developed.     Comments: Elderly female resting comfortably in no acute discomfort  HENT:     Head: Normocephalic and atraumatic.     Mouth/Throat:     Mouth: Mucous membranes are moist.  Eyes:     Extraocular Movements: Extraocular movements intact.     Conjunctiva/sclera: Conjunctivae normal.     Pupils: Pupils are equal, round, and reactive to light.  Cardiovascular:     Rate and Rhythm: Rhythm irregular.     Pulses: Normal pulses.     Heart sounds: Normal heart sounds.  Pulmonary:     Effort: Pulmonary effort is normal.     Breath sounds: No wheezing, rhonchi or rales.  Abdominal:     Palpations: Abdomen is soft.     Tenderness: There is no abdominal tenderness.  Musculoskeletal:     Cervical back: Normal range of motion and neck supple. No rigidity.  Skin:    Findings: No rash.  Neurological:     Mental Status: She is alert.     Comments: Neurologic exam:   Speech is slow to respond but able to answer questions and follows commands, pupils equal round  reactive to light, extraocular movements intact  Normal peripheral visual fields Cranial nerves III through XII normal including no facial droop Follows commands, moves all extremities x4, normal strength to bilateral upper and lower extremities at all major muscle groups including grip Sensation normal to light touch  No pronator drift Gait not tested Unable to recall 3 words after 5 minutes.  Alert and oriented x3  Psychiatric:        Mood and Affect: Mood normal.    ED Results / Procedures / Treatments   Labs (all labs ordered are listed,  but only abnormal results are displayed) Labs Reviewed  CBC WITH DIFFERENTIAL/PLATELET - Abnormal; Notable for the following components:      Result Value   Lymphs Abs 4.5 (*)    All other components within normal limits  BASIC METABOLIC PANEL - Abnormal; Notable for the following components:   Glucose, Bld 128 (*)    BUN 38 (*)    Creatinine, Ser 2.18 (*)    GFR, Estimated 20 (*)    All other components within normal limits  CBG MONITORING, ED - Abnormal; Notable for the following components:   Glucose-Capillary 110 (*)    All other components within normal limits  SARS CORONAVIRUS 2 (TAT 6-24 HRS)  PROTIME-INR  APTT  URINALYSIS, ROUTINE W REFLEX MICROSCOPIC    EKG EKG Interpretation  Date/Time:  Thursday May 31 2021 16:35:18 EDT Ventricular Rate:  91 PR Interval:    QRS Duration: 68 QT Interval:  384 QTC Calculation: 472 R Axis:   3 Text Interpretation: Atrial fibrillation Possible Anteroseptal infarct , age undetermined Abnormal ECG No significant change since last tracing Confirmed by Isla Pence [id number]) on 05/31/2021 4:53:15 PM  Radiology CT HEAD WO CONTRAST  Result Date: 05/31/2021 CLINICAL DATA:  Altered mental status. EXAM: CT HEAD WITHOUT CONTRAST TECHNIQUE: Contiguous axial images were obtained from the base of the skull through the vertex without intravenous contrast. COMPARISON:  None. FINDINGS: Brain: Mild  age-related atrophy and chronic microvascular ischemic changes. There is no acute intracranial hemorrhage. No mass effect midline shift. No extra-axial fluid collection. Vascular: No hyperdense vessel or unexpected calcification. Skull: Normal. Negative for fracture or focal lesion. Sinuses/Orbits: No acute finding. Other: None IMPRESSION: 1. No acute intracranial pathology. 2. Mild age-related atrophy and chronic microvascular ischemic changes. Electronically Signed   By: Anner Crete M.D.   On: 05/31/2021 19:19    Procedures .Critical Care  Date/Time: 05/31/2021 9:49 PM Performed by: Domenic Moras, PA-C Authorized by: Domenic Moras, PA-C   Critical care provider statement:    Critical care time (minutes):  32   Critical care was time spent personally by me on the following activities:  Discussions with consultants, evaluation of patient's response to treatment, examination of patient, ordering and performing treatments and interventions, ordering and review of laboratory studies, ordering and review of radiographic studies, pulse oximetry, re-evaluation of patient's condition, obtaining history from patient or surrogate and review of old charts   Medications Ordered in ED Medications  sodium chloride flush (NS) 0.9 % injection 3 mL (3 mLs Intravenous Not Given 05/31/21 1730)  LORazepam (ATIVAN) injection 1 mg (has no administration in time range)    ED Course  I have reviewed the triage vital signs and the nursing notes.  Pertinent labs & imaging results that were available during my care of the patient were reviewed by me and considered in my medical decision making (see chart for details).    MDM Rules/Calculators/A&P                           BP (!) 182/99   Pulse 77   Temp 97.7 F (36.5 C) (Oral)   Resp 18   SpO2 100%   Final Clinical Impression(s) / ED Diagnoses Final diagnoses:  Aphasia    Rx / DC Orders ED Discharge Orders     None      5:19 PM This is an elderly  female brought here due to altered mental status with last known normal yesterday  afternoon.  Does have significant history of atrial fibrillation currently on Eliquis as well as having an atrial thrombus diagnosed 2 months ago when she was hospitalized for A. fib.  She has been compliant with her medication.  Although she is slow to respond she was able to answer questions appropriately and does not have any obvious focal neurodeficit on initial exam.  Blood pressure elevated at 182/107.  Work-up initiated.  Care discussed with Dr. Gilford Raid.   7:31 PM Head CT scan without any acute abnormality, labs are at baseline, will order brain MRI for further stroke work-up.  Appreciate consultation from [facility] hospitalist Dr. Hal Hope who agrees to admit patient but request neurology to be consulted.  7:42 PM Appreciate consultation from on call neurologist Dr. Jonny Ruiz who is made aware of pt.  He will reach out to hospitalist for coordination of care.    Domenic Moras, PA-C 05/31/21 1944    Domenic Moras, PA-C 05/31/21 2149    Isla Pence, MD 05/31/21 2303

## 2021-05-31 NOTE — ED Provider Notes (Signed)
Emergency Medicine Provider Triage Evaluation Note  Heather Saunders , a 87 y.o. female  was evaluated in triage.  Pt complains of possible memory impairment. Unable to obtain history from patient in triage. No family member present. Patient denies visual and speech changes. No unilateral weakness.  Level 5 caveat secondary to memory impairment.  Nephew reported to triage room. Notes patient's nurse arrived to house at noon or 1PM and had significant memory impairment and difficulties processing words. Nephew notes patient was speaking normally last night with normal memory. Nephew notes patient's BP was elevated. Last known well was last night.   Review of Systems  Positive: Memory issues Negative:  Physical Exam  BP (!) 182/107 (BP Location: Right Arm)   Pulse 90   Temp 97.7 F (36.5 C) (Oral)   Resp 14   SpO2 100%  Gen:   Awake, no distress   Resp:  Normal effort  MSK:   Moves extremities without difficulty  Other:  Broken up speech, unable to remember why she is here. No facial droop, no drift  Medical Decision Making  Medically screening exam initiated at 4:31 PM.  Appropriate orders placed.  Sheily Kanaan was informed that the remainder of the evaluation will be completed by another provider, this initial triage assessment does not replace that evaluation, and the importance of remaining in the ED until their evaluation is complete.  Stroke labs ordered Patient on Eliquis and last known well last night, not a candidate for TPA.   Suzy Bouchard, PA-C 05/31/21 1644    Carmin Muskrat, MD 05/31/21 [phone]

## 2021-05-31 NOTE — Consult Note (Addendum)
NEUROLOGY CONSULTATION NOTE   Date of service: May 31, 2021 Patient Name: Heather Saunders MRN:  [id number] DOB:  [birth year] Reason for consult: "Stroke" Requesting Provider: Rise Patience, MD _ _ _   _ __   _ __ _ _  __ __   _ __   __ _  History of Present Illness  Heather Saunders is a 87 y.o. female with PMH significant for afibb on eliquis, hx of right atrial thrombus, CKD 4, HTN, MI, osteoporosis who presented to the ED with confusion. She had workup with MRI brain which demonstrated an acute left caudate head infarct. She had to be given Ativan '1mg'$  to be able to get the MRI and on my evaluation is difficult to arouse and snoring. Unable to get any history out of her.  Most of the history obtained via discussion with the admitting team and from ED notes.   Apparently, was found by her home health nurse around 1300 on 05/31/21 confused and difficulty forming sentences. LKW sometime on 05/30/21 when her nephew saw her being able to speak in full sentences. She had also not taken her morning medications.  Per notes, she was able to talk to the ED team and did not report any active symptoms.  MRI Brain demonstrated a left caudate head infarct. MR Angio head w/o contrast with no LVO.  Neurology was consulted for further evaluation and management.  mRS: unclear tPA: not offered as she is outside window and is on eliquis Thrombectomy: not a candidate, no LVO. NIHSS components Score: Comment  1a Level of Conscious 0'[]'$  1'[]'$  2'[]'$  3'[x]'$      1b LOC Questions 0'[]'$  1'[]'$  2'[x]'$       1c LOC Commands 0'[]'$  1'[]'$  2'[x]'$       2 Best Gaze 0'[x]'$  1'[]'$  2'[]'$       3 Visual 0'[x]'$  1'[]'$  2'[]'$  3'[]'$      4 Facial Palsy 0'[x]'$  1'[]'$  2'[]'$  3'[]'$      5a Motor Arm - left 0'[]'$  1'[x]'$  2'[]'$  3'[]'$  4'[]'$  UN'[]'$    5b Motor Arm - Right 0'[]'$  1'[]'$  2'[x]'$  3'[]'$  4'[]'$  UN'[]'$    6a Motor Leg - Left 0'[]'$  1'[x]'$  2'[]'$  3'[]'$  4'[]'$  UN'[]'$    6b Motor Leg - Right 0'[]'$  1'[x]'$  2'[]'$  3'[]'$  4'[]'$  UN'[]'$    7 Limb Ataxia 0'[x]'$  1'[]'$  2'[]'$  3'[]'$  UN'[]'$     8 Sensory 0'[x]'$  1'[]'$  2'[]'$  UN'[]'$      9 Best Language  0'[]'$  1'[]'$  2'[]'$  3'[x]'$      10 Dysarthria 0'[x]'$  1'[]'$  2'[]'$  UN'[]'$      11 Extinct. and Inattention 0'[x]'$  1'[]'$  2'[]'$       TOTAL: 15     ROS   Unable to obtain as patient is somnolent and difficult to arouse after getting Ativan '1mg'$ .  Past History   Past Medical History:  Diagnosis Date   Chronic kidney disease    Coronary disease    NonObstructive   History of nuclear stress test    Nuclear stress test 10/18: EF 96, normal perfusion, low risk   Hyperlipidemia    Hypertension    Myocardial infarct [facility]) 2006   Osteopenia    Osteoporosis    Past Surgical History:  Procedure Laterality Date   LIPOMA EXCISION     removed from Shoulder   TEE WITHOUT CARDIOVERSION N/A 03/20/2021   Procedure: TRANSESOPHAGEAL ECHOCARDIOGRAM (TEE);  Surgeon: Donato Heinz, MD;  Location: [facility] ENDOSCOPY;  Service: Cardiovascular;  Laterality: N/A;   Family History  Problem Relation Age of Onset   Cancer Mother 44  breast   Social History   Socioeconomic History   Marital status: Divorced    Spouse name: Not on file   Number of children: Not on file   Years of education: Not on file   Highest education level: Not on file  Occupational History   Not on file  Tobacco Use   Smoking status: Never   Smokeless tobacco: Never  Vaping Use   Vaping Use: Never used  Substance and Sexual Activity   Alcohol use: Yes    Alcohol/week: 2.0 standard drinks    Types: 2 Glasses of wine per week   Drug use: No   Sexual activity: Not on file  Other Topics Concern   Not on file  Social History Narrative   Not on file   Social Determinants of Health   Financial Resource Strain: Not on file  Food Insecurity: Not on file  Transportation Needs: Not on file  Physical Activity: Not on file  Stress: Not on file  Social Connections: Not on file   Allergies  Allergen Reactions   Boniva [Ibandronic Acid]     Sore throat   Fosamax [Alendronate Sodium]     Sore throat    Medications  (Not in a hospital  admission)    Vitals   Vitals:   05/31/21 2015 05/31/21 2030 05/31/21 2045 05/31/21 2100  BP: (!) 181/100 (!) 180/98 (!) 187/94 (!) 174/104  Pulse: 72 78 74 70  Resp:      Temp:      TempSrc:      SpO2: 97% 100% 98% 99%     There is no height or weight on file to calculate BMI.  Physical Exam   General: Laying comfortably in bed; in no acute distress.  HENT: Normal oropharynx and mucosa. Normal external appearance of ears and nose.  Neck: Supple, no pain or tenderness  CV: No JVD. No peripheral edema.  Pulmonary: Symmetric Chest rise. Normal respiratory effort.  Abdomen: Soft to touch, non-tender.  Ext: No cyanosis, edema, or deformity  Skin: No rash. Normal palpation of skin.   Musculoskeletal: Normal digits and nails by inspection. No clubbing.   Neurologic Examination  Mental status/Cognition: Somnolent, opens eyes partially and very briefly to loud voice or sternal rub and grimaces. She immediately closes her eyes. Speech/language: Did not speak for me, likely due to somnolence after ativan. Cranial nerves:   CN II Pupils equal and reactive to light, unable to assess for VF deficits    CN III,IV,VI EOM intact to dolls eyes, no gaze preference or deviation, no nystagmus   CN V Corneals + bilaterally.   CN VII no asymmetry, no nasolabial fold flattening   CN VIII    CN IX & X    CN XI    CN XII    Motor:  Muscle bulk: poor, tone normal On sternal rub or nares stimulation, she moves LUE more than RUE. She does withdraw RUE and LUE to pain. To babinski's, she withdraws BL lower extremities.  Reflexes:  Right Left Comments  Pectoralis      Biceps (C5/6) 1 1   Brachioradialis (C5/6) 1 1    Triceps (C6/7) 1 1    Patellar (L3/4) 1 1    Achilles (S1)      Hoffman      Plantar     Jaw jerk    Sensation: Grimaces to pinch in all extremities.  Coordination/Complex Motor:  Unable to assess but did bring her LUE all  the way to her nose with no obvious  ataxia. Labs   CBC:  Recent Labs  Lab 05/31/21 1632  WBC 10.0  NEUTROABS 4.9  HGB 12.5  HCT 40.5  MCV 89.2  PLT A999333    Basic Metabolic Panel:  Lab Results  Component Value Date   NA 138 05/31/2021   K 4.1 05/31/2021   CO2 27 05/31/2021   GLUCOSE 128 (H) 05/31/2021   BUN 38 (H) 05/31/2021   CREATININE 2.18 (H) 05/31/2021   CALCIUM 9.8 05/31/2021   GFRNONAA 20 (L) 05/31/2021   GFRAA 29 (L) 06/28/2020   Lipid Panel:  Lab Results  Component Value Date   LDLCALC 89 06/06/2020   HgbA1c: No results found for: HGBA1C Urine Drug Screen: No results found for: LABOPIA, COCAINSCRNUR, LABBENZ, AMPHETMU, THCU, LABBARB  Alcohol Level No results found for: [place]  CT Head without contrast(personally reviewed): CTH was negative for a large hypodensity concerning for a large territory infarct or hyperdensity concerning for an ICH  MR Angio head without contrast and Carotid Duplex BL: Personally reviewed MRA with no LVO. Carotid duplex is still pending.  MRI Brain: Left caudate stroke  Impression   Heather Saunders is a 87 y.o. female with PMH significant for afibb on eliquis, hx of right atrial thrombus, CKD 4, HTN, MI, osteoporosis who presented to the ED with confusion. Found to have left caudate head acute stroke. Her neurologic examination is significantly limited due to her getting Ativan for MRIs. She appears to move her RUE less than her LUE, otherwise somnolent and difficult to arouse and I could not get her to participate with exam.  Primary Diagnosis:  Other cerebral infarction due to occlusion of stenosis of small artery.  Secondary Diagnosis: Chronic atrial fibrillation and CKD Stage 4 (GFR 15-29)  Recommendations  Plan:  - Frequent Neuro checks per stroke unit protocol - Recommend US Carotid doppler - Recommend obtaining TTE - Recommend obtaining Lipid panel with LDL - Please start statin if LDL > 70 - Recommend HbA1c - Aspirin '81mg'$  daily for now. Would  recommend switching back to her home dose of eliquis 2.'5mg'$  BID in a couple days. Aspirin should be discontinued when resuming Eliquis. - Recommend DVT ppx, stop when resuming anticoagulation. - SBP goal - permissive hypertension first 24 h < 220/110. Held home meds.  - Recommend Telemetry monitoring for arrythmia - Recommend bedside swallow screen prior to PO intake. - Stroke education booklet - Recommend PT/OT/SLP consult __________________________________________________________________  Plan discussed with Dr. Hal Hope over secure chat.  Thank you for the opportunity to take part in the care of this patient. If you have any further questions, please contact the neurology consultation attending.  Signed,  [place] Pager Number [phone] _ _ _   _ __   _ __ _ _  __ __   _ __   __ _

## 2021-05-31 NOTE — H&P (Addendum)
History and Physical    Heather Saunders [id number] DOB: 09/25/1925 DOA: 05/31/2021  PCP: Kathyrn Lass, MD  Patient coming from: Home.  Chief Complaint: Confusion.  Slurred speech.  History obtained from patient's nephew.  HPI: Heather Saunders is a 87 y.o. female with history of atrial fibrillation, pulmonary hypertension, chronic kidney disease stage IV, anemia was found to be confused by patient's home health aide this afternoon around 1 PM when she went to check on her routinely.  Patient's nephew who provided the history stated that she was normal 2 days ago when he went to check on her.  This afternoon patient was confused and had some slurred speech.  Patient had not taken her medication.  Which she usually does by herself.  Patient was brought to the ER.  ED Course: In the ER patient appears mildly confused and CT head did not show anything acute.  Given the symptoms MRI brain has been ordered for further work-up.  Labs show creatinine around 2.18 which is around baseline EKG shows A. fib rate controlled.  Patient was afebrile.  COVID test is pending.  On exam at bedside patient just received IV Ativan for MRI.  Appears mildly sedated but was able to talk following commands moving all extremities.  No obvious facial asymmetry.  Pupils are reacting to light.  Review of Systems: As per HPI, rest all negative.   Past Medical History:  Diagnosis Date   Chronic kidney disease    Coronary disease    NonObstructive   History of nuclear stress test    Nuclear stress test 10/18: EF 96, normal perfusion, low risk   Hyperlipidemia    Hypertension    Myocardial infarct [facility]) 2006   Osteopenia    Osteoporosis     Past Surgical History:  Procedure Laterality Date   LIPOMA EXCISION     removed from Shoulder   TEE WITHOUT CARDIOVERSION N/A 03/20/2021   Procedure: TRANSESOPHAGEAL ECHOCARDIOGRAM (TEE);  Surgeon: Donato Heinz, MD;  Location: [facility] ENDOSCOPY;  Service:  Cardiovascular;  Laterality: N/A;     reports that she has never smoked. She has never used smokeless tobacco. She reports current alcohol use of about 2.0 standard drinks per week. She reports that she does not use drugs.  Allergies  Allergen Reactions   Boniva [Ibandronic Acid]     Sore throat   Fosamax [Alendronate Sodium]     Sore throat    Family History  Problem Relation Age of Onset   Cancer Mother 77       breast    Prior to Admission medications   Medication Sig Start Date End Date Taking? Authorizing Provider  acetaminophen (TYLENOL) 500 MG tablet Take 500 mg by mouth every 6 (six) hours as needed for mild pain.   Yes [provider]  apixaban (ELIQUIS) 2.5 MG TABS tablet Take 1 tablet (2.5 mg total) by mouth 2 (two) times daily. 03/26/21  Yes Imagene Sheller S, DO  atorvastatin (LIPITOR) 40 MG tablet Take 40 mg by mouth daily.   Yes [provider]  Cholecalciferol (VITAMIN D3) 50 MCG (2000 UT) capsule Take 2,000 Units by mouth daily.   Yes [provider]  diltiazem (CARDIZEM CD) 120 MG 24 hr capsule Take 1 capsule (120 mg total) by mouth daily. 03/23/21  Yes Bhagat, Bhavinkumar, PA  ezetimibe (ZETIA) 10 MG tablet TAKE 1 TABLET BY MOUTH EVERY DAY Patient taking differently: Take 10 mg by mouth daily. 07/31/20  Yes Fay Records,  MD  furosemide (LASIX) 20 MG tablet Take 1 tablet (20 mg total) by mouth daily. 03/26/21 05/31/21 Yes Anwar, Bonnee Quin S, DO  metoprolol tartrate (LOPRESSOR) 100 MG tablet Take 1 tablet (100 mg total) by mouth 2 (two) times daily. 03/26/21  Yes Imagene Sheller S, DO  Multiple Vitamin (MULTIVITAMIN) capsule Take 1 capsule by mouth daily.   Yes [provider]  polyethylene glycol (MIRALAX / GLYCOLAX) 17 g packet Take 17 g by mouth daily as needed for mild constipation.    [provider]  sodium bicarbonate 650 MG tablet Take 1 tablet (650 mg total) by mouth daily. Patient not taking: No sig reported 03/31/21    Elmarie Shiley, MD    Physical Exam: Constitutional: Moderately built and nourished. Vitals:   05/31/21 2015 05/31/21 2030 05/31/21 2045 05/31/21 2100  BP: (!) 181/100 (!) 180/98 (!) 187/94 (!) 174/104  Pulse: 72 78 74 70  Resp:      Temp:      TempSrc:      SpO2: 97% 100% 98% 99%   Eyes: Anicteric no pallor. ENMT: No discharge from the ears eyes nose and mouth. Neck: No mass felt.  No neck rigidity. Respiratory: No rhonchi or crepitations. Cardiovascular: S1-S2 heard. Abdomen: Soft nontender bowel sounds present. Musculoskeletal: No edema. Skin: No rash. Neurologic: Patient is mildly sedated from the IV Ativan she received but is answering questions but repeating the same answers.  Moving all extremities.  Pupils are equal and reacting to light. Psychiatric: Mildly sedated after receiving IV Ativan.   Labs on Admission: I have personally reviewed following labs and imaging studies  CBC: Recent Labs  Lab 05/31/21 1632  WBC 10.0  NEUTROABS 4.9  HGB 12.5  HCT 40.5  MCV 89.2  PLT A999333   Basic Metabolic Panel: Recent Labs  Lab 05/31/21 1632  NA 138  K 4.1  CL 101  CO2 27  GLUCOSE 128*  BUN 38*  CREATININE 2.18*  CALCIUM 9.8   GFR: CrCl cannot be calculated (Unknown ideal weight.). Liver Function Tests: No results for input(s): AST, ALT, ALKPHOS, BILITOT, PROT, ALBUMIN in the last 168 hours. No results for input(s): LIPASE, AMYLASE in the last 168 hours. No results for input(s): AMMONIA in the last 168 hours. Coagulation Profile: Recent Labs  Lab 05/31/21 1644  INR 1.2   Cardiac Enzymes: No results for input(s): CKTOTAL, CKMB, CKMBINDEX, TROPONINI in the last 168 hours. BNP (last 3 results) No results for input(s): PROBNP in the last 8760 hours. HbA1C: No results for input(s): HGBA1C in the last 72 hours. CBG: Recent Labs  Lab 05/31/21 1723  GLUCAP 110*   Lipid Profile: No results for input(s): CHOL, HDL, LDLCALC, TRIG, CHOLHDL, LDLDIRECT in  the last 72 hours. Thyroid Function Tests: No results for input(s): TSH, T4TOTAL, FREET4, T3FREE, THYROIDAB in the last 72 hours. Anemia Panel: No results for input(s): VITAMINB12, FOLATE, FERRITIN, TIBC, IRON, RETICCTPCT in the last 72 hours. Urine analysis:    Component Value Date/Time   COLORURINE YELLOW 03/18/2021 0720   APPEARANCEUR HAZY (A) 03/18/2021 0720   LABSPEC 1.018 03/18/2021 0720   PHURINE 5.0 03/18/2021 0720   GLUCOSEU NEGATIVE 03/18/2021 0720   HGBUR NEGATIVE 03/18/2021 0720   BILIRUBINUR NEGATIVE 03/18/2021 0720   KETONESUR 5 (A) 03/18/2021 0720   PROTEINUR 100 (A) 03/18/2021 0720   NITRITE NEGATIVE 03/18/2021 0720   LEUKOCYTESUR NEGATIVE 03/18/2021 0720   Sepsis Labs: '@LABRCNTIP'$ (procalcitonin:4,lacticidven:4) )No results found for this or any previous visit (from the past  240 hour(s)).   Radiological Exams on Admission: CT HEAD WO CONTRAST  Result Date: 05/31/2021 CLINICAL DATA:  Altered mental status. EXAM: CT HEAD WITHOUT CONTRAST TECHNIQUE: Contiguous axial images were obtained from the base of the skull through the vertex without intravenous contrast. COMPARISON:  None. FINDINGS: Brain: Mild age-related atrophy and chronic microvascular ischemic changes. There is no acute intracranial hemorrhage. No mass effect midline shift. No extra-axial fluid collection. Vascular: No hyperdense vessel or unexpected calcification. Skull: Normal. Negative for fracture or focal lesion. Sinuses/Orbits: No acute finding. Other: None IMPRESSION: 1. No acute intracranial pathology. 2. Mild age-related atrophy and chronic microvascular ischemic changes. Electronically Signed   By: Anner Crete M.D.   On: 05/31/2021 19:19    EKG: Independently reviewed.  A. fib rate controlled.  Assessment/Plan Principal Problem:   Acute encephalopathy Active Problems:   Essential hypertension   Pulmonary HTN (HCC)   Atrial fibrillation (HCC)    Acute encephalopathy with slurred speech  concerning for possible stroke.  Patient is afebrile at this time.  MRI brain has been ordered.  If MRI brain does show stroke and further work-up for stroke.  Discussed with neurologist. Hypertension on Cardizem and beta-blockers.  If patient's MRI does show stroke then we may have to lower permissive hypertension. History of A. fib on metoprolol and Cardizem.  Patient also takes Eliquis.  I have not ordered any of them for now until we get MRI results which is being done now. Chronic kidney disease stage IV creatinine appears to be at baseline. History of CHF and pulmonary hypertension appears compensated. 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 History of right atrial thrombus recent 2D echo done about 2 weeks ago did not show any thrombus.  COVID test is pending.  Addendum -discussed with Dr. Lorrin Goodell for the MRI brain findings.  As per the neurologist MRI does show acute left caudate stroke MRA did not show any large vessel obstruction.  I also discussed with on-call cardiologist Dr. Gaynelle Arabian.  At this time Dr. Gaynelle Arabian cardiologist said that recent 2D echo did not show any right atrial thrombus.  So neurologist is planning to hold off the apixaban for 2 days and advised to keep patient on aspirin due to acute stroke.  We will be getting 2D echo carotid Doppler neurochecks and swallow evaluation.  Check hemoglobin A1c lipid panel.  Physical therapy consult.   DVT prophylaxis: Lovenox. Code Status: Full code. Family Communication: Patient's nephew. Disposition Plan: To be determined. Consults called: Neurologist. Admission status: Observation.   Rise Patience MD [facility] Pager [phone].  If 7PM-7AM, please contact night-coverage [email or web address] Password [facility]  05/31/2021, 9:37 PM

## 2021-05-31 NOTE — ED Notes (Signed)
Patient transported to MRI 

## 2021-05-31 NOTE — ED Triage Notes (Signed)
Patient states she is here because her family is concerned that she has been forgetting things recently.

## 2021-06-01 ENCOUNTER — Observation Stay (HOSPITAL_COMMUNITY): Payer: Medicare Other

## 2021-06-01 ENCOUNTER — Inpatient Hospital Stay (HOSPITAL_COMMUNITY): Payer: Medicare Other

## 2021-06-01 ENCOUNTER — Other Ambulatory Visit: Payer: Self-pay

## 2021-06-01 ENCOUNTER — Observation Stay (HOSPITAL_BASED_OUTPATIENT_CLINIC_OR_DEPARTMENT_OTHER): Payer: Medicare Other

## 2021-06-01 DIAGNOSIS — N179 Acute kidney failure, unspecified: Secondary | ICD-10-CM | POA: Diagnosis present

## 2021-06-01 DIAGNOSIS — I6389 Other cerebral infarction: Secondary | ICD-10-CM | POA: Diagnosis not present

## 2021-06-01 DIAGNOSIS — I10 Essential (primary) hypertension: Secondary | ICD-10-CM | POA: Diagnosis not present

## 2021-06-01 DIAGNOSIS — I5032 Chronic diastolic (congestive) heart failure: Secondary | ICD-10-CM

## 2021-06-01 DIAGNOSIS — R627 Adult failure to thrive: Secondary | ICD-10-CM | POA: Diagnosis present

## 2021-06-01 DIAGNOSIS — G9341 Metabolic encephalopathy: Secondary | ICD-10-CM | POA: Diagnosis present

## 2021-06-01 DIAGNOSIS — R531 Weakness: Secondary | ICD-10-CM | POA: Diagnosis not present

## 2021-06-01 DIAGNOSIS — L89321 Pressure ulcer of left buttock, stage 1: Secondary | ICD-10-CM | POA: Diagnosis present

## 2021-06-01 DIAGNOSIS — N184 Chronic kidney disease, stage 4 (severe): Secondary | ICD-10-CM | POA: Diagnosis not present

## 2021-06-01 DIAGNOSIS — E785 Hyperlipidemia, unspecified: Secondary | ICD-10-CM | POA: Diagnosis present

## 2021-06-01 DIAGNOSIS — I252 Old myocardial infarction: Secondary | ICD-10-CM | POA: Diagnosis not present

## 2021-06-01 DIAGNOSIS — Z66 Do not resuscitate: Secondary | ICD-10-CM | POA: Diagnosis not present

## 2021-06-01 DIAGNOSIS — I4819 Other persistent atrial fibrillation: Secondary | ICD-10-CM | POA: Diagnosis present

## 2021-06-01 DIAGNOSIS — G9340 Encephalopathy, unspecified: Secondary | ICD-10-CM

## 2021-06-01 DIAGNOSIS — I272 Pulmonary hypertension, unspecified: Secondary | ICD-10-CM | POA: Diagnosis present

## 2021-06-01 DIAGNOSIS — I2510 Atherosclerotic heart disease of native coronary artery without angina pectoris: Secondary | ICD-10-CM | POA: Diagnosis present

## 2021-06-01 DIAGNOSIS — I63312 Cerebral infarction due to thrombosis of left middle cerebral artery: Secondary | ICD-10-CM

## 2021-06-01 DIAGNOSIS — E43 Unspecified severe protein-calorie malnutrition: Secondary | ICD-10-CM | POA: Diagnosis present

## 2021-06-01 DIAGNOSIS — I639 Cerebral infarction, unspecified: Secondary | ICD-10-CM

## 2021-06-01 DIAGNOSIS — I4821 Permanent atrial fibrillation: Secondary | ICD-10-CM | POA: Diagnosis not present

## 2021-06-01 DIAGNOSIS — I482 Chronic atrial fibrillation, unspecified: Secondary | ICD-10-CM | POA: Diagnosis not present

## 2021-06-01 DIAGNOSIS — I6340 Cerebral infarction due to embolism of unspecified cerebral artery: Secondary | ICD-10-CM | POA: Diagnosis present

## 2021-06-01 DIAGNOSIS — N185 Chronic kidney disease, stage 5: Secondary | ICD-10-CM | POA: Diagnosis present

## 2021-06-01 DIAGNOSIS — I132 Hypertensive heart and chronic kidney disease with heart failure and with stage 5 chronic kidney disease, or end stage renal disease: Secondary | ICD-10-CM | POA: Diagnosis present

## 2021-06-01 DIAGNOSIS — R4701 Aphasia: Secondary | ICD-10-CM | POA: Diagnosis present

## 2021-06-01 DIAGNOSIS — E872 Acidosis: Secondary | ICD-10-CM | POA: Diagnosis not present

## 2021-06-01 DIAGNOSIS — G9349 Other encephalopathy: Secondary | ICD-10-CM | POA: Diagnosis not present

## 2021-06-01 DIAGNOSIS — L89311 Pressure ulcer of right buttock, stage 1: Secondary | ICD-10-CM | POA: Diagnosis present

## 2021-06-01 DIAGNOSIS — I5033 Acute on chronic diastolic (congestive) heart failure: Secondary | ICD-10-CM | POA: Diagnosis present

## 2021-06-01 DIAGNOSIS — H9190 Unspecified hearing loss, unspecified ear: Secondary | ICD-10-CM | POA: Diagnosis present

## 2021-06-01 DIAGNOSIS — Z515 Encounter for palliative care: Secondary | ICD-10-CM | POA: Diagnosis not present

## 2021-06-01 DIAGNOSIS — Z681 Body mass index (BMI) 19 or less, adult: Secondary | ICD-10-CM | POA: Diagnosis not present

## 2021-06-01 DIAGNOSIS — I4891 Unspecified atrial fibrillation: Secondary | ICD-10-CM | POA: Diagnosis not present

## 2021-06-01 DIAGNOSIS — Z20822 Contact with and (suspected) exposure to covid-19: Secondary | ICD-10-CM | POA: Diagnosis present

## 2021-06-01 DIAGNOSIS — D631 Anemia in chronic kidney disease: Secondary | ICD-10-CM | POA: Diagnosis present

## 2021-06-01 DIAGNOSIS — Z7189 Other specified counseling: Secondary | ICD-10-CM | POA: Diagnosis not present

## 2021-06-01 LAB — URINALYSIS, ROUTINE W REFLEX MICROSCOPIC
Bacteria, UA: NONE SEEN
Bilirubin Urine: NEGATIVE
Glucose, UA: NEGATIVE mg/dL
Hgb urine dipstick: NEGATIVE
Ketones, ur: 5 mg/dL — AB
Leukocytes,Ua: NEGATIVE
Nitrite: NEGATIVE
Protein, ur: 100 mg/dL — AB
Specific Gravity, Urine: 1.011 (ref 1.005–1.030)
pH: 6 (ref 5.0–8.0)

## 2021-06-01 LAB — HEMOGLOBIN A1C
Hgb A1c MFr Bld: 5.7 % — ABNORMAL HIGH (ref 4.8–5.6)
Mean Plasma Glucose: 116.89 mg/dL

## 2021-06-01 LAB — CBC
HCT: 40.1 % (ref 36.0–46.0)
Hemoglobin: 12.6 g/dL (ref 12.0–15.0)
MCH: 27.5 pg (ref 26.0–34.0)
MCHC: 31.4 g/dL (ref 30.0–36.0)
MCV: 87.6 fL (ref 80.0–100.0)
Platelets: 207 10*3/uL (ref 150–400)
RBC: 4.58 MIL/uL (ref 3.87–5.11)
RDW: 14.9 % (ref 11.5–15.5)
WBC: 9 10*3/uL (ref 4.0–10.5)
nRBC: 0 % (ref 0.0–0.2)

## 2021-06-01 LAB — ECHOCARDIOGRAM COMPLETE
Area-P 1/2: 3.72 cm2
Height: 63.5 in
P 1/2 time: 932 msec
S' Lateral: 2.2 cm
Weight: 1744 oz

## 2021-06-01 LAB — COMPREHENSIVE METABOLIC PANEL
ALT: 20 U/L (ref 0–44)
AST: 20 U/L (ref 15–41)
Albumin: 3.3 g/dL — ABNORMAL LOW (ref 3.5–5.0)
Alkaline Phosphatase: 73 U/L (ref 38–126)
Anion gap: 11 (ref 5–15)
BUN: 34 mg/dL — ABNORMAL HIGH (ref 8–23)
CO2: 27 mmol/L (ref 22–32)
Calcium: 9.7 mg/dL (ref 8.9–10.3)
Chloride: 101 mmol/L (ref 98–111)
Creatinine, Ser: 2.05 mg/dL — ABNORMAL HIGH (ref 0.44–1.00)
GFR, Estimated: 22 mL/min — ABNORMAL LOW (ref >60)
Glucose, Bld: 85 mg/dL (ref 70–99)
Potassium: 3.8 mmol/L (ref 3.5–5.1)
Sodium: 139 mmol/L (ref 135–145)
Total Bilirubin: 1.4 mg/dL — ABNORMAL HIGH (ref 0.3–1.2)
Total Protein: 5.5 g/dL — ABNORMAL LOW (ref 6.5–8.1)

## 2021-06-01 LAB — LIPID PANEL
Cholesterol: 153 mg/dL (ref 0–200)
HDL: 44 mg/dL (ref >40)
LDL Cholesterol: 88 mg/dL (ref 0–99)
Total CHOL/HDL Ratio: 3.5 RATIO
Triglycerides: 103 mg/dL (ref <150)
VLDL: 21 mg/dL (ref 0–40)

## 2021-06-01 LAB — GLUCOSE, CAPILLARY
Glucose-Capillary: 82 mg/dL (ref 70–99)
Glucose-Capillary: 84 mg/dL (ref 70–99)
Glucose-Capillary: 109 mg/dL — ABNORMAL HIGH (ref 70–99)

## 2021-06-01 LAB — SARS CORONAVIRUS 2 (TAT 6-24 HRS): SARS Coronavirus 2: NEGATIVE

## 2021-06-01 MED ORDER — ENOXAPARIN SODIUM 300 MG/3ML IJ SOLN
25.0000 mg | INTRAMUSCULAR | Status: DC
  Administered 2021-06-01: 25 mg via SUBCUTANEOUS
  Filled 2021-06-01 (×2): qty 0.25

## 2021-06-01 MED ORDER — DEXTROSE-NACL 5-0.9 % IV SOLN: INTRAVENOUS | Status: DC

## 2021-06-01 MED ORDER — SODIUM CHLORIDE 0.9 % IV SOLN: INTRAVENOUS | Status: DC

## 2021-06-01 MED ORDER — LABETALOL HCL 5 MG/ML IV SOLN: 10.0000 mg | INTRAVENOUS | Status: DC | PRN

## 2021-06-01 MED ORDER — ASPIRIN 325 MG PO TABS: 325.0000 mg | ORAL_TABLET | Freq: Every day | ORAL | Status: DC

## 2021-06-01 MED ORDER — ASPIRIN 300 MG RE SUPP
300.0000 mg | Freq: Every day | RECTAL | Status: DC
  Administered 2021-06-01 – 2021-06-02 (×2): 300 mg via RECTAL
  Filled 2021-06-01 (×3): qty 1

## 2021-06-01 NOTE — Progress Notes (Signed)
Patient still appear somnolent, response only to moderate stimuli then drift back to sleep. Please see VS on doc flow. Will continue to monitor.

## 2021-06-01 NOTE — Progress Notes (Signed)
RN consult with RRT and MD regarding patient's status of continuous somnolent. RN and NTalso noticed period of apneic and unable to arouse with stimuli. Rapid RN arrived to bedside  and during this period, pt arousal and  able to move extremities involuntary with stimuli, still not fully awake or follow command at this time.  CT ordered per MD. CBG at 82. Will continue to monitor.

## 2021-06-01 NOTE — Plan of Care (Signed)
  Problem: Education: Goal: Knowledge of secondary prevention will improve Outcome: Not Progressing Goal: Knowledge of patient specific risk factors addressed and post discharge goals established will improve Outcome: Not Progressing Goal: Individualized Educational Video(s) Outcome: Not Progressing

## 2021-06-01 NOTE — Evaluation (Signed)
Physical Therapy Evaluation Patient Details Name: Heather Saunders MRN: [id number] DOB: 1925/03/02 Today's Date: 06/01/2021   History of Present Illness  87 y.o. female presents to [facility] ED on 05/31/2021 with confusion and slurred speech. 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 PMH includes atrial fibrillation, pulmonary hypertension, chronic kidney disease stage IV, anemia.  Clinical Impression  Pt presents to PT with lethargy, as well as deficits in functional mobility, strength, power, endurance, cognition, gait. PT eval is greatly limited by level of arousal as pt requires physical assistance to initiate all mobility and unable to maintain balance without assistance. Pt demonstrates brief periods of increased arousal, however quickly becomes lethargic and closes eyes when stimulation is reduced. Pt will benefit from continued acute PT services to improve mobility quality and reduce falls risk.    Follow Up Recommendations SNF (may improve if level of arousal improves)    Equipment Recommendations  Wheelchair (measurements PT);Wheelchair cushion (measurements PT);Hospital bed    Recommendations for Other Services       Precautions / Restrictions Precautions Precautions: Fall Restrictions Weight Bearing Restrictions: No      Mobility  Bed Mobility Overal bed mobility: Needs Assistance Bed Mobility: Supine to Sit;Sit to Supine     Supine to sit: Total assist;HOB elevated Sit to supine: Total assist        Transfers Overall transfer level: Needs assistance Equipment used: 1 person hand held assist Transfers: Sit to/from Stand Sit to Stand: Mod assist         General transfer comment: PT initiates sit to stand with rocking, pt follows through with motor plan, utilizing LEs to power into standing  Ambulation/Gait                Stairs            Wheelchair Mobility    Modified Rankin (Stroke Patients Only) Modified Rankin (Stroke  Patients Only) Pre-Morbid Rankin Score: No significant disability Modified Rankin: Severe disability     Balance Overall balance assessment: Needs assistance Sitting-balance support: No upper extremity supported;Feet supported Sitting balance-Leahy Scale: Poor Sitting balance - Comments: maxA, limited postural righting observed, improves slightly with L hand hold Postural control: Posterior lean Standing balance support: Bilateral upper extremity supported Standing balance-Leahy Scale: Poor Standing balance comment: mod-maxA                             Pertinent Vitals/Pain Pain Assessment: Faces Faces Pain Scale: No hurt    Home Living Family/patient expects to be discharged to:: Private residence Living Arrangements: Alone Available Help at Discharge: Friend(s);Available PRN/intermittently Type of Home: Apartment Home Access: Level entry     Home Layout: One level Home Equipment: None Additional Comments: history taken from recent admission in June as pt is unable to report history and no visitors present    Prior Function Level of Independence: Independent         Comments: Pt independent at baseline without use of an AD, drives, grocery shops, and performs ADLs independently. denies any falls     Hand Dominance   Dominant Hand: Right    Extremity/Trunk Assessment   Upper Extremity Assessment Upper Extremity Assessment: Difficult to assess due to impaired cognition    Lower Extremity Assessment Lower Extremity Assessment: Difficult to assess due to impaired cognition (pt does utilize LEs to assist into standing after PT initiation)    Cervical / Trunk Assessment Cervical / Trunk Assessment:  Kyphotic  Communication   Communication: HOH  Cognition Arousal/Alertness: Lethargic;Suspect due to medications (pt received ativan for MRI yesterday, posbbily related to CVA also) Behavior During Therapy: Flat affect Overall Cognitive Status: Difficult  to assess Area of Impairment: Following commands                       Following Commands: Follows one step commands inconsistently       General Comments: pt very lethargic during session, arouses to changes in position and does respond verbally intermittently but is only alert with eyes open for very brief periods before eyes close again. Pt does not follow commands consistently and requires tactile cueing and physical assistance to initiate all mobility      General Comments General comments (skin integrity, edema, etc.): VSS on RA    Exercises     Assessment/Plan    PT Assessment Patient needs continued PT services  PT Problem List Decreased strength;Decreased activity tolerance;Decreased balance;Decreased mobility;Decreased cognition;Decreased knowledge of use of DME;Decreased safety awareness;Decreased knowledge of precautions       PT Treatment Interventions DME instruction;Gait training;Functional mobility training;Therapeutic activities;Therapeutic exercise;Balance training;Neuromuscular re-education;Cognitive remediation;Patient/family education;Wheelchair mobility training    PT Goals (Current goals can be found in the Care Plan section)  Acute Rehab PT Goals Patient Stated Goal: pt unable to state, PT goal to improve arousal and reduce falls risk PT Goal Formulation: Patient unable to participate in goal setting Time For Goal Achievement: 06/15/21 Potential to Achieve Goals: Fair    Frequency Min 3X/week   Barriers to discharge        Co-evaluation               AM-PAC PT "6 Clicks" Mobility  Outcome Measure Help needed turning from your back to your side while in a flat bed without using bedrails?: Total Help needed moving from lying on your back to sitting on the side of a flat bed without using bedrails?: Total Help needed moving to and from a bed to a chair (including a wheelchair)?: Total Help needed standing up from a chair using your  arms (e.g., wheelchair or bedside chair)?: A Lot Help needed to walk in hospital room?: Total Help needed climbing 3-5 steps with a railing? : Total 6 Click Score: 7    End of Session   Activity Tolerance: Patient limited by lethargy Patient left: in bed;with call bell/phone within reach;with bed alarm set Nurse Communication: Mobility status;Need for lift equipment PT Visit Diagnosis: Other abnormalities of gait and mobility (R26.89);Muscle weakness (generalized) (M62.81);Other symptoms and signs involving the nervous system (R29.898)    Time: [id number] PT Time Calculation (min) (ACUTE ONLY): 14 min   Charges:   PT Evaluation $PT Eval Low Complexity: 1 Low          Zenaida Niece, PT, DPT Acute Rehabilitation Pager: [phone]   Zenaida Niece 06/01/2021, 10:05 AM

## 2021-06-01 NOTE — Significant Event (Signed)
Rapid Response Event Note   Reason for Call : Unresponsive  Initial Focused Assessment: RN stated that patient was not arousing to painful stimuli, but has been throughout the day. Received a dose of Ativan last night prior to MRI and has been somnolent since. MRI showed "small acute infarct of the left caudate nucleus".   On arrival, patient lying in bed minimally moving extremities voluntarily. Pt did respond to painful stimuli on all four extremities on assessment. She was able to follow commands in all extremities. Pupils were a 4, equal, round, and reactive bilaterally. Skin was warm and dry. Lungs clear/diminished bilaterally. Abdomen soft.   VS: BP 176/71, HR 65, RR 12, O2 100% on RA, T 97.7 CBG: 82  Interventions:  -STAT head CT  Plan of Care:  -No acute changes on head CT. Continue to monitor LOC  Call RRN for any additional needs.  Event Summary:   MD Notified: Claiborne Billings, PA and Dr. Leonie Man with Neuro Call Time: [place] Time: [place] Time: [place]  Netta Corrigan, RN

## 2021-06-01 NOTE — Therapy (Signed)
OT Cancellation Note  Patient Details Name: Heather Saunders MRN: [id number] DOB: 1925/03/24   Cancelled Treatment:    Reason Eval/Treat Not Completed: Fatigue/lethargy limiting ability to participate; pt rousable briefly to stimulation, opens her eyes and quickly closes again, unable to answer questions or provide history. Non-verbal during OT efforts to assess her.  Will follow for ability to participate in acute OT assessment.  Carlynn Herald, Rohen Kimes Beth Dixon, OTR/L 06/01/2021, 10:15 am

## 2021-06-01 NOTE — Progress Notes (Signed)
Pt still sleep Rapid response called was told Ativan still in system continue to monitor til ativan effects wear off. Will continue to monitor pt.

## 2021-06-01 NOTE — Progress Notes (Signed)
Patient noted more awake at this time. Able to follow command but disoriented to place, time, and situation. Report given to oncoming RN.

## 2021-06-01 NOTE — Progress Notes (Signed)
Patient continue to appears somnolent, able to awake with stimuli. Repositioned at this time. See VS.

## 2021-06-01 NOTE — Progress Notes (Signed)
Pt arrived from ED given report from ED nurse was told pt given Ativan after MRI. Pt arrived to unit sleeping unable to get hx from pt

## 2021-06-01 NOTE — Progress Notes (Signed)
STROKE TEAM PROGRESS NOTE   INTERVAL HISTORY Patient seen in room. She is lethargic and difficult to arouse but able to follow simple commands. There are no family members in the room at this time.   Vitals:   06/01/21 0000 06/01/21 0227 06/01/21 0448 06/01/21 0815  BP:  (!) 176/97 (!) 158/87 (!) 177/85  Pulse:  68 78 65  Resp:  '18 18 18  '$ Temp:  97.6 F (36.4 C) 97.7 F (36.5 C) 97.7 F (36.5 C)  TempSrc:  Oral Axillary Axillary  SpO2:  100% 99% 98%  Weight: 49.4 kg     Height: 5' 3.5" (1.613 m)      CBC:  Recent Labs  Lab 05/31/21 1632 06/01/21 0330  WBC 10.0 9.0  NEUTROABS 4.9  --   HGB 12.5 12.6  HCT 40.5 40.1  MCV 89.2 87.6  PLT 239 [id number]   Basic Metabolic Panel:  Recent Labs  Lab 05/31/21 1632 06/01/21 0330  NA 138 139  K 4.1 3.8  CL 101 101  CO2 27 27  GLUCOSE 128* 85  BUN 38* 34*  CREATININE 2.18* 2.05*  CALCIUM 9.8 9.7    Lipid Panel:  Recent Labs  Lab 06/01/21 0330  CHOL 153  TRIG 103  HDL 44  CHOLHDL 3.5  VLDL 21  LDLCALC 88    HgbA1c:  Recent Labs  Lab 06/01/21 0330  HGBA1C 5.7*   Urine Drug Screen: No results for input(s): LABOPIA, COCAINSCRNUR, LABBENZ, AMPHETMU, THCU, LABBARB in the last 168 hours.  Alcohol Level No results for input(s): ETH in the last 168 hours.  IMAGING past 24 hours CT HEAD WO CONTRAST  Result Date: 05/31/2021 CLINICAL DATA:  Altered mental status. EXAM: CT HEAD WITHOUT CONTRAST TECHNIQUE: Contiguous axial images were obtained from the base of the skull through the vertex without intravenous contrast. COMPARISON:  None. FINDINGS: Brain: Mild age-related atrophy and chronic microvascular ischemic changes. There is no acute intracranial hemorrhage. No mass effect midline shift. No extra-axial fluid collection. Vascular: No hyperdense vessel or unexpected calcification. Skull: Normal. Negative for fracture or focal lesion. Sinuses/Orbits: No acute finding. Other: None IMPRESSION: 1. No acute intracranial pathology. 2.  Mild age-related atrophy and chronic microvascular ischemic changes. Electronically Signed   By: Anner Crete M.D.   On: 05/31/2021 19:19   MR ANGIO HEAD WO CONTRAST  Result Date: 05/31/2021 CLINICAL DATA:  Acute neurologic deficit EXAM: MRI HEAD WITHOUT CONTRAST MRA HEAD WITHOUT CONTRAST TECHNIQUE: Multiplanar, multi-echo pulse sequences of the brain and surrounding structures were acquired without intravenous contrast. Angiographic images of the Circle of Willis were acquired using MRA technique without intravenous contrast. COMPARISON:  No pertinent prior exam. 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 No acute or chronic hemorrhage. There is multifocal hyperintense T2-weighted signal within the white matter. Generalized volume loss without a clear lobar predilection. The midline structures are normal. Vascular: Major flow voids are preserved. Skull and upper cervical spine: Normal calvarium and skull base. Visualized upper cervical spine and soft tissues are normal. Sinuses/Orbits:No paranasal sinus fluid levels or advanced mucosal thickening. No mastoid or middle ear effusion. Normal orbits. MRA HEAD FINDINGS POSTERIOR CIRCULATION: --Vertebral arteries: Normal --Inferior cerebellar arteries: Normal. --Basilar artery: Normal. --Superior cerebellar arteries: Normal. --Posterior cerebral arteries: Normal. ANTERIOR CIRCULATION: --Intracranial internal carotid arteries: Normal. --Anterior cerebral arteries (ACA): Normal. --Middle cerebral arteries (MCA): Normal. ANATOMIC VARIANTS: Fetal origin of both posterior cerebral arteries. IMPRESSION: 1. 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  No hemorrhage or mass effect. 2. Normal intracranial MRA. Electronically Signed   By: Ulyses Jarred M.D.   On: 05/31/2021 22:26   MR BRAIN WO CONTRAST  Result Date: 05/31/2021 CLINICAL DATA:  Acute neurologic deficit EXAM: MRI HEAD WITHOUT CONTRAST MRA HEAD WITHOUT CONTRAST  TECHNIQUE: Multiplanar, multi-echo pulse sequences of the brain and surrounding structures were acquired without intravenous contrast. Angiographic images of the Circle of Willis were acquired using MRA technique without intravenous contrast. COMPARISON:  No pertinent prior exam. 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 No acute or chronic hemorrhage. There is multifocal hyperintense T2-weighted signal within the white matter. Generalized volume loss without a clear lobar predilection. The midline structures are normal. Vascular: Major flow voids are preserved. Skull and upper cervical spine: Normal calvarium and skull base. Visualized upper cervical spine and soft tissues are normal. Sinuses/Orbits:No paranasal sinus fluid levels or advanced mucosal thickening. No mastoid or middle ear effusion. Normal orbits. MRA HEAD FINDINGS POSTERIOR CIRCULATION: --Vertebral arteries: Normal --Inferior cerebellar arteries: Normal. --Basilar artery: Normal. --Superior cerebellar arteries: Normal. --Posterior cerebral arteries: Normal. ANTERIOR CIRCULATION: --Intracranial internal carotid arteries: Normal. --Anterior cerebral arteries (ACA): Normal. --Middle cerebral arteries (MCA): Normal. ANATOMIC VARIANTS: Fetal origin of both posterior cerebral arteries. IMPRESSION: 1. 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 No hemorrhage or mass effect. 2. Normal intracranial MRA. Electronically Signed   By: Ulyses Jarred M.D.   On: 05/31/2021 22:26    PHYSICAL EXAM  General: Laying comfortably in bed; in no acute distress.  HENT: Normal oropharynx and mucosa. Normal external appearance of ears and nose.  Neck: Supple, no pain or tenderness  CV: No JVD. No peripheral edema.  Pulmonary: Symmetric Chest rise. Normal respiratory effort.  Abdomen: Soft to touch, non-tender.  Ext: No cyanosis, edema, or deformity  Skin: No rash. Normal palpation of skin.   Musculoskeletal: Normal  digits and nails by inspection. No clubbing.     ASSESSMENT/PLAN Heather Saunders is a 87 y.o. female with history of   afib on eliquis 2.'5mg'$  bid, hx of right atrial thrombus, CKD 4, HTN, MI, osteoporosis who presented to the ED with confusion. Apparently, was found by her home health nurse around 1300 on 05/31/21 confused and difficulty forming sentences. LKW sometime on 05/30/21 when her nephew saw her being able to speak in full sentences. She had also not taken her morning medications.   Per notes, she was able to talk to the ED team and did not report any active symptoms.   MRI Brain demonstrated a left caudate head infarct. MR Angio head w/o contrast with no LVO.  PLAN: continue with aspirin '325mg'$  daily. Keep blood pressure normalized, maintain tight glycemic and lipid control. Will consider increasing dose of eliquis to '5mg'$  bid, as patient had stroke while on subtherapeutic dose of 2.'5mg'$  bid.    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  Small vessel disease. Atrophy.    MRI BRAIN:  Small acute infarct of the left caudate nucleus. No hemorrhage or mass effect. MRA  Normal intracranial MRA.  Carotid Doppler  RICA: [id number] stenosis; LICA 123456 stenosis, antregrade flow bilateral vertebral arteries.  2D Echo LVEF 55-60%, no LVH, normal LA size, no IA shunt LDL 88 HgbA1c 5.7 VTE prophylaxis - scd    There are no active orders of the following types: Diet, Nourishments.   Eliquis (apixaban) daily prior to admission, now on aspirin  325 mg daily.   Therapy recommendations:  SNF/ wheelchair/hospital  bed Disposition:  snf  Hypertension Home meds:  cardizem, lasix, lopressor,  Stable Permissive hypertension (OK if < 220/120) but gradually normalize in 5-7 days Long-term BP goal normotensive  Hyperlipidemia Home meds:  lipitor '40mg'$ , zetia '10mg'$ ,  resumed in hospital LDL 88, goal < 70 Continue statin at same dose  given advanced age at discharge  HgbA1c 5.7, goal < 7.0 CBGs Recent Labs    05/31/21 1723  GLUCAP 110*    SSI  Other Stroke Risk Factors  Advanced Age >/= 55   Other Active Problems Esential hypertension Pulmonary hypertension Atrial fibrillation   Hospital day # 0     To contact Stroke Continuity provider, please refer to [email or web address] After hours, contact General Neurology

## 2021-06-01 NOTE — Progress Notes (Addendum)
STROKE TEAM PROGRESS NOTE   INTERVAL HISTORY Patient seen in room.  She received IV Ativan for MRI and since then she is lethargic and difficult to arouse but able to follow simple commands. There are no family members in the room at this time.  Hemoglobin A1c is 5.7 LDL cholesterol is 85 mg percent.  MRI shows a large left corona radiata infarct likely embolic from her A. fib despite being on anticoagulation with Eliquis.  Echocardiogram is pending Vitals:   06/01/21 0227 06/01/21 0448 06/01/21 0815 06/01/21 1059  BP: (!) 176/97 (!) 158/87 (!) 177/85 (!) 158/79  Pulse: 68 78 65 (!) 57  Resp: '18 18 18 17  '$ Temp: 97.6 F (36.4 C) 97.7 F (36.5 C) 97.7 F (36.5 C) 98 F (36.7 C)  TempSrc: Oral Axillary Axillary Axillary  SpO2: 100% 99% 98% 96%  Weight:      Height:       CBC:  Recent Labs  Lab 05/31/21 1632 06/01/21 0330  WBC 10.0 9.0  NEUTROABS 4.9  --   HGB 12.5 12.6  HCT 40.5 40.1  MCV 89.2 87.6  PLT 239 [id number]    Basic Metabolic Panel:  Recent Labs  Lab 05/31/21 1632 06/01/21 0330  NA 138 139  K 4.1 3.8  CL 101 101  CO2 27 27  GLUCOSE 128* 85  BUN 38* 34*  CREATININE 2.18* 2.05*  CALCIUM 9.8 9.7     Lipid Panel:  Recent Labs  Lab 06/01/21 0330  CHOL 153  TRIG 103  HDL 44  CHOLHDL 3.5  VLDL 21  LDLCALC 88     HgbA1c:  Recent Labs  Lab 06/01/21 0330  HGBA1C 5.7*    Urine Drug Screen: No results for input(s): LABOPIA, COCAINSCRNUR, LABBENZ, AMPHETMU, THCU, LABBARB in the last 168 hours.  Alcohol Level No results for input(s): ETH in the last 168 hours.  IMAGING past 24 hours CT HEAD WO CONTRAST  Result Date: 05/31/2021 CLINICAL DATA:  Altered mental status. EXAM: CT HEAD WITHOUT CONTRAST TECHNIQUE: Contiguous axial images were obtained from the base of the skull through the vertex without intravenous contrast. COMPARISON:  None. FINDINGS: Brain: Mild age-related atrophy and chronic microvascular ischemic changes. There is no acute intracranial  hemorrhage. No mass effect midline shift. No extra-axial fluid collection. Vascular: No hyperdense vessel or unexpected calcification. Skull: Normal. Negative for fracture or focal lesion. Sinuses/Orbits: No acute finding. Other: None IMPRESSION: 1. No acute intracranial pathology. 2. Mild age-related atrophy and chronic microvascular ischemic changes. Electronically Signed   By: Anner Crete M.D.   On: 05/31/2021 19:19   MR ANGIO HEAD WO CONTRAST  Result Date: 05/31/2021 CLINICAL DATA:  Acute neurologic deficit EXAM: MRI HEAD WITHOUT CONTRAST MRA HEAD WITHOUT CONTRAST TECHNIQUE: Multiplanar, multi-echo pulse sequences of the brain and surrounding structures were acquired without intravenous contrast. Angiographic images of the Circle of Willis were acquired using MRA technique without intravenous contrast. COMPARISON:  No pertinent prior exam. 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 No acute or chronic hemorrhage. There is multifocal hyperintense T2-weighted signal within the white matter. Generalized volume loss without a clear lobar predilection. The midline structures are normal. Vascular: Major flow voids are preserved. Skull and upper cervical spine: Normal calvarium and skull base. Visualized upper cervical spine and soft tissues are normal. Sinuses/Orbits:No paranasal sinus fluid levels or advanced mucosal thickening. No mastoid or middle ear effusion. Normal orbits. MRA HEAD FINDINGS POSTERIOR CIRCULATION: --Vertebral arteries: Normal --Inferior cerebellar  arteries: Normal. --Basilar artery: Normal. --Superior cerebellar arteries: Normal. --Posterior cerebral arteries: Normal. ANTERIOR CIRCULATION: --Intracranial internal carotid arteries: Normal. --Anterior cerebral arteries (ACA): Normal. --Middle cerebral arteries (MCA): Normal. ANATOMIC VARIANTS: Fetal origin of both posterior cerebral arteries. IMPRESSION: 1. Small acute infarct of the left caudate  nucleus. No hemorrhage or mass effect. 2. Normal intracranial MRA. Electronically Signed   By: Ulyses Jarred M.D.   On: 05/31/2021 22:26   MR BRAIN WO CONTRAST  Result Date: 05/31/2021 CLINICAL DATA:  Acute neurologic deficit EXAM: MRI HEAD WITHOUT CONTRAST MRA HEAD WITHOUT CONTRAST TECHNIQUE: Multiplanar, multi-echo pulse sequences of the brain and surrounding structures were acquired without intravenous contrast. Angiographic images of the Circle of Willis were acquired using MRA technique without intravenous contrast. COMPARISON:  No pertinent prior exam. 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 No acute or chronic hemorrhage. There is multifocal hyperintense T2-weighted signal within the white matter. Generalized volume loss without a clear lobar predilection. The midline structures are normal. Vascular: Major flow voids are preserved. Skull and upper cervical spine: Normal calvarium and skull base. Visualized upper cervical spine and soft tissues are normal. Sinuses/Orbits:No paranasal sinus fluid levels or advanced mucosal thickening. No mastoid or middle ear effusion. Normal orbits. MRA HEAD FINDINGS POSTERIOR CIRCULATION: --Vertebral arteries: Normal --Inferior cerebellar arteries: Normal. --Basilar artery: Normal. --Superior cerebellar arteries: Normal. --Posterior cerebral arteries: Normal. ANTERIOR CIRCULATION: --Intracranial internal carotid arteries: Normal. --Anterior cerebral arteries (ACA): Normal. --Middle cerebral arteries (MCA): Normal. ANATOMIC VARIANTS: Fetal origin of both posterior cerebral arteries. IMPRESSION: 1. 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 No hemorrhage or mass effect. 2. Normal intracranial MRA. Electronically Signed   By: Ulyses Jarred M.D.   On: 05/31/2021 22:26   ECHOCARDIOGRAM COMPLETE  Result Date: 06/01/2021    ECHOCARDIOGRAM REPORT   Patient Name:   CLEMENCE STOMBAUGH Date of Exam: 06/01/2021 Medical Rec #:  [id number]         Height:       63.5 in Accession #:    [id number]       Weight:       109.0 lb Date of Birth:  1925/07/08        BSA:          1.503 m Patient Age:    87 years         BP:           158/87 mmHg Patient Gender: F                HR:           70 bpm. Exam Location:  Inpatient Procedure: 2D Echo, Cardiac Doppler and Color Doppler Indications:    Stroke I63.9  History:        Patient has prior history of Echocardiogram examinations, most                 recent 05/16/2021. Previous Myocardial Infarction; Risk                 Factors:Dyslipidemia and Hypertension.  Sonographer:    Bernadene Person RDCS Referring Phys: [place]  1. Left ventricular ejection fraction, by estimation, is 55 to 60%. The left ventricle has normal function. The left ventricle has no regional wall motion abnormalities. Left ventricular diastolic parameters are consistent with Grade I diastolic dysfunction (impaired relaxation).  2. Right ventricular systolic function is mildly reduced. The right ventricular size is normal. There is mildly  elevated pulmonary artery systolic pressure.  3. The mitral valve is grossly normal. No evidence of mitral valve regurgitation.  4. The aortic valve is tricuspid. Aortic valve regurgitation is trivial. Aortic valve sclerosis is present, with no evidence of aortic valve stenosis.  5. The inferior vena cava is dilated in size with >50% respiratory variability, suggesting right atrial pressure of 8 mmHg.  6. Tricuspid valve regurgitation is mild to moderate. Comparison(s): A prior study was performed on 05/16/21. Prior RVSP 65 mm Hg. FINDINGS  Left Ventricle: Left ventricular ejection fraction, by estimation, is 55 to 60%. The left ventricle has normal function. The left ventricle has no regional wall motion abnormalities. The left ventricular internal cavity size was small. There is no left ventricular hypertrophy. Left ventricular diastolic parameters are consistent with Grade I  diastolic dysfunction (impaired relaxation). Right Ventricle: The right ventricular size is normal. No increase in right ventricular wall thickness. Right ventricular systolic function is mildly reduced. There is mildly elevated pulmonary artery systolic pressure. The tricuspid regurgitant velocity  is 2.78 m/s, and with an assumed right atrial pressure of 8 mmHg, the estimated right ventricular systolic pressure is [id number]. Left Atrium: Left atrial size was normal in size. Right Atrium: Right atrial size was normal in size. Pericardium: Trivial pericardial effusion is present. Mitral Valve: The mitral valve is grossly normal. No evidence of mitral valve regurgitation. Tricuspid Valve: The tricuspid valve is normal in structure. Tricuspid valve regurgitation is mild to moderate. Aortic Valve: The aortic valve is tricuspid. There is mild calcification of the aortic valve. There is mild thickening of the aortic valve. Aortic valve regurgitation is trivial. Aortic regurgitation PHT measures 932 msec. Mild aortic valve sclerosis is present, with no evidence of aortic valve stenosis. Pulmonic Valve: The pulmonic valve was not well visualized. Pulmonic valve regurgitation is mild to moderate. No evidence of pulmonic stenosis. Aorta: The aortic root and ascending aorta are structurally normal, with no evidence of dilitation. Venous: The inferior vena cava is dilated in size with greater than 50% respiratory variability, suggesting right atrial pressure of 8 mmHg. IAS/Shunts: The atrial septum is grossly normal.  LEFT VENTRICLE PLAX 2D LVIDd:         3.10 cm  Diastology LVIDs:         2.20 cm  LV e' medial:    3.09 cm/s LV PW:         1.10 cm  LV E/e' medial:  11.4 LV IVS:        0.90 cm  LV e' lateral:   3.32 cm/s LVOT diam:     2.00 cm  LV E/e' lateral: 10.6 LV SV:         54 LV SV Index:   36 LVOT Area:     3.14 cm  RIGHT VENTRICLE RV S prime:     6.98 cm/s TAPSE (M-mode): 1.3 cm LEFT ATRIUM             Index        RIGHT ATRIUM           Index LA diam:        3.20 cm 2.13 cm/m  RA Area:     11.20 cm LA Vol (A2C):   38.8 ml 25.82 ml/m RA Volume:   25.30 ml  16.84 ml/m LA Vol (A4C):   38.7 ml 25.75 ml/m LA Biplane Vol: 39.5 ml 26.28 ml/m  AORTIC VALVE  PULMONIC VALVE LVOT Vmax:   88.93 cm/s  PR End Diast Vel: 9.61 msec LVOT Vmean:  60.933 cm/s LVOT VTI:    0.171 m AI PHT:      932 msec  AORTA Ao Root diam: 3.10 cm Ao Asc diam:  3.30 cm MITRAL VALVE               TRICUSPID VALVE MV Area (PHT): 3.72 cm    TR Peak grad:   30.9 mmHg MV Decel Time: 204 msec    TR Vmax:        278.00 cm/s MV E velocity: 35.20 cm/s MV A velocity: 84.20 cm/s  SHUNTS MV E/A ratio:  0.42        Systemic VTI:  0.17 m                            Systemic Diam: 2.00 cm Rudean Haskell MD Electronically signed by Rudean Haskell MD Signature Date/Time: 06/01/2021/12:16:58 PM    Final    VAS US CAROTID (at [facility] and [facility] only)  Result Date: 06/01/2021 Carotid Arterial Duplex Study Patient Name:  NATHALIE YANEZ  Date of Exam:   06/01/2021 Medical Rec #: [id number]         Accession #:    [id number] Date of Birth: 24-Sep-1925         Patient Gender: F Patient Age:   10 years Exam Location:  [facility] Procedure:      VAS US CAROTID Referring Phys: Gean Birchwood --------------------------------------------------------------------------------  Indications:  CVA. Risk Factors: Hypertension, hyperlipidemia, coronary artery disease. Performing Technologist: Archie Patten RVS  Examination Guidelines: A complete evaluation includes B-mode imaging, spectral Doppler, color Doppler, and power Doppler as needed of all accessible portions of each vessel. Bilateral testing is considered an integral part of a complete examination. Limited examinations for reoccurring indications may be performed as noted.  Right Carotid Findings: +----------+--------+--------+--------+------------------+--------+           PSV cm/sEDV cm/sStenosisPlaque  DescriptionComments +----------+--------+--------+--------+------------------+--------+ CCA Prox  30      8               heterogenous               +----------+--------+--------+--------+------------------+--------+ CCA Distal25      7               heterogenous               +----------+--------+--------+--------+------------------+--------+ ICA Prox  57      20      1-39%   heterogenous               +----------+--------+--------+--------+------------------+--------+ ICA Distal34      11                                         +----------+--------+--------+--------+------------------+--------+ ECA       52      6                                          +----------+--------+--------+--------+------------------+--------+ +----------+--------+-------+--------+-------------------+           PSV cm/sEDV cmsDescribeArm Pressure (mmHG) +----------+--------+-------+--------+-------------------+ [id number]                                         +----------+--------+-------+--------+-------------------+ +---------+--------+--+--------+-+---------+  VertebralPSV cm/s21EDV cm/s5Antegrade +---------+--------+--+--------+-+---------+  Left Carotid Findings: +----------+--------+--------+--------+------------------+--------+           PSV cm/sEDV cm/sStenosisPlaque DescriptionComments +----------+--------+--------+--------+------------------+--------+ CCA Prox  31      7               heterogenous               +----------+--------+--------+--------+------------------+--------+ CCA Distal28      6               heterogenous               +----------+--------+--------+--------+------------------+--------+ ICA Prox  24      10      1-39%   heterogenous               +----------+--------+--------+--------+------------------+--------+ ICA Distal25      6                                           +----------+--------+--------+--------+------------------+--------+ ECA       44                                                 +----------+--------+--------+--------+------------------+--------+ +----------+--------+--------+--------+-------------------+           PSV cm/sEDV cm/sDescribeArm Pressure (mmHG) +----------+--------+--------+--------+-------------------+ [id number]                                          +----------+--------+--------+--------+-------------------+ +---------+--------+--+--------+-+---------+ VertebralPSV cm/s22EDV cm/s5Antegrade +---------+--------+--+--------+-+---------+   Summary: Right Carotid: Velocities in the right ICA are consistent with a 1-39% stenosis. Left Carotid: Velocities in the left ICA are consistent with a 1-39% stenosis. Vertebrals: Bilateral vertebral arteries demonstrate antegrade flow. *See table(s) above for measurements and observations.     Preliminary     PHYSICAL EXAM  General: Frail elderly Caucasian lady laying comfortably in bed; in no acute distress.  HENT: Normal oropharynx and mucosa. Normal external appearance of ears and nose.  Neck: Supple, no pain or tenderness  CV: No JVD. No peripheral edema.  Pulmonary: Symmetric Chest rise. Normal respiratory effort.  Abdomen: Soft to touch, non-tender.  Ext: No cyanosis, edema, or deformity  Skin: No rash. Normal palpation of skin.   Musculoskeletal: Normal digits and nails by inspection. No clubbing.   Mental status/Cognition: Somnolent, opens eyes partially and very briefly to loud voice or sternal rub and grimaces. She immediately closes her eyes.  Eyes are in primary position Speech/language: Did not speak for me, likely due to somnolence after ativan. Cranial nerves:   CN II Pupils equal and reactive to light, unable to assess for VF deficits    CN III,IV,VI EOM intact to dolls eyes, no gaze preference or deviation, no nystagmus   CN V Corneals + bilaterally.    CN VII no asymmetry, no nasolabial fold flattening   Motor:  Muscle bulk: poor, tone normal On sternal rub or nares stimulation, she moves LUE more than RUE. She does withdraw RUE and LUE to pain even against gravity To babinski's, she withdraws BL lower extremities. Sensation: Difficult to assess but she appears intact   ASSESSMENT/PLAN Ms. Shakedra Igou is a 87 y.o. female with history of  afib on eliquis 2.'5mg'$  bid, hx of right atrial thrombus, CKD 4, HTN, MI, osteoporosis who presented to the ED with confusion. Apparently, was found by her home health nurse around 1300 on 05/31/21 confused and difficulty forming sentences. LKW sometime on 05/30/21 when her nephew saw her being able to speak in full sentences. She had also not taken her morning medications.   Per notes, she was able to talk to the ED team and did not report any active symptoms.   MRI Brain demonstrated a left caudate head infarct. MR Angio head w/o contrast with no LVO.  PLAN: continue with aspirin '325mg'$  daily. Keep blood pressure normalized, maintain tight glycemic and lipid control. Will consider increasing dose of eliquis to '5mg'$  bid, as patient had stroke while on subtherapeutic dose of 2.'5mg'$  bid if her age and renal function permit.    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  Small vessel disease. Atrophy.    MRI BRAIN:  Small acute infarct of the left caudate nucleus. No hemorrhage or mass effect. MRA  Normal intracranial MRA.  Carotid Doppler  RICA: [id number] stenosis; LICA 123456 stenosis, antregrade flow bilateral vertebral arteries.  2D Echo LVEF 55-60%, no LVH, normal LA size, no IA shunt LDL 88 HgbA1c 5.7 VTE prophylaxis - scd    There are no active orders of the following types: Diet, Nourishments.   Eliquis (apixaban) daily prior to admission, now on aspirin 325 mg daily.   Therapy recommendations:  SNF/ wheelchair/hospital   bed Disposition:  snf  Hypertension Home meds:  cardizem, lasix, lopressor,  Stable Permissive hypertension (OK if < 220/120) but gradually normalize in 5-7 days Long-term BP goal normotensive  Hyperlipidemia Home meds:  lipitor '40mg'$ , zetia '10mg'$ ,  resumed in hospital LDL 88, goal < 70 Continue statin at same dose given advanced age at discharge  HgbA1c 5.7, goal < 7.0 CBGs Recent Labs    05/31/21 1723 06/01/21 1255  GLUCAP 110* 82     SSI  Other Stroke Risk Factors  Advanced Age >/= 72   Other Active Problems Esential hypertension Pulmonary hypertension Atrial fibrillation   Hospital day # 0 I have personally obtained history,examined this patient, reviewed notes, independently viewed imaging studies, participated in medical decision making and plan of care.ROS completed by me personally and pertinent positives fully documented  I have made any additions or clarifications directly to the above note. Agree with note above.  Patient presented with large left subcortical infarct likely cardioembolic from atrial fibrillation despite being on anticoagulation with eliquis 2.'5mg'$  twice daily.  Unfortunately due to her renal failure and increased age we cannot increase the dose.  I do not recommend changing over to warfarin which will carry increased risk of brain bleeding along with additional hassle of repeat INR checking and dosage adjustment.  Continue close neurological monitoring follow-up echocardiogram more awake therapy consults.  Discussed with Dr. Grandville Silos.  Morform available bedside.  Greater than 50% time during this 35-minute visit was spent on.  Counseling and coordination about her embolic stroke and atrial fibrillation..  Discussion with care team.  Antony Contras, MD Medical Director [place] Pager: [phone] 06/01/2021 2:05 PM     To contact Stroke Continuity provider, please refer to [email or web address] After hours, contact General Neurology

## 2021-06-01 NOTE — Progress Notes (Addendum)
Notified by nursing staff that patient remained somnolent and slow to respond. Patient received sedation for MRI procedure last night. For our exam earlier today she was lethargic and somnolent but was able to follow some commands.   Stat head CT obtained to ensure stability of known left caudate stroke. Report from CT indicate no new stroke or hemorrhagic conversion of known stroke.   Would not recommend sedation on this patient .  We will continue to monitor. Will resume her eliquis 2.'5mg'$  bid when she awakens and is able to safely swallow. Will discontinue aspirin.

## 2021-06-01 NOTE — Progress Notes (Signed)
Patient cont to remains somnolent with mod stimuli and easily fall back asleep. Urine sample obtained. Will continue to monitor.

## 2021-06-01 NOTE — Progress Notes (Signed)
Carotid duplex has been completed.   Preliminary results in CV Proc.   Jinny Blossom Ajani Rineer 06/01/2021 10:16 AM

## 2021-06-01 NOTE — Progress Notes (Signed)
PROGRESS NOTE    Heather Saunders  [id number] DOB: [birth year] DOA: 05/31/2021 PCP: Kathyrn Lass, MD    Chief Complaint  Patient presents with   Memory Loss    Brief Narrative:  Patient 87 year old female history of A. fib on Eliquis, pulmonary hypertension, chronic kidney disease stage IV, anemia, presented to the ED with confusion as noted by home health aide with some associated slurred speech.  Work-up in the ED with head CT being unremarkable, MRI head consistent with acute CVA.  Patient admitted for stroke work-up.   Assessment & Plan:   Principal Problem:   CVA (cerebral vascular accident) [facility]) Active Problems:   Essential hypertension   Pulmonary HTN ([place])   Acute encephalopathy   Atrial fibrillation (HCC)  #1 acute CVA/acute metabolic encephalopathy -Patient presenting with confusion and slurred speech. -Head CT done on admission negative for any acute abnormalities. -MRI brain/MRA head done consistent with small acute infarct of the left caudate nucleus, no hemorrhage or mass-effect.  Normal intracranial MRA. -2D echo done with normal EF, no source of emboli. -Carotid Dopplers done with no significant ICA stenosis. -Patient noted to have significant lethargy today however opens eyes to noxious stimuli and moving extremities spontaneously.  Lethargy likely secondary to Ativan which was given prior to MRI. -Repeat head CT done with no hemorrhagic conversion of acute CVA and no new infarct. -Patient noted to have been started on aspirin on admission.  Neurology consultation which was done. -Patient seen by stroke team and now recommending transitioning back to Eliquis 2.5 mg twice daily when patient is more alert and tolerating oral intake. -PT/OT/SLP. -Appreciate neurology input and recommendations.  2.  Hypertension -Permissive hypertension secondary to problem #1.  3.  History of A. fib -Patient with permissive hypertension secondary to problem #1.  Rate  currently controlled. -Metoprolol on hold. -Resume Eliquis when patient is more alert and tolerating oral intake.  4.  Chronic kidney disease stage IV -Stable.  5.  History of diastolic CHF/pulmonary hypertension -Stable. -Euvolemic on examination.  6.  History of right atrial thrombus -2D echo done 2 weeks prior to admission with no signs of thrombus noted. -Repeat 2D echo done with EF of 55 to 60%, NWMA, mildly reduced right ventricular systolic function, right ventricular size normal, mildly elevated pulmonary artery systolic pressure, mild to moderate TVR. -No thrombus noted on 2D echo -Antihypertensive medications on hold secondary to permissive hypertension.    DVT prophylaxis: Lovenox Code Status: Full Family Communication: No family at bedside. Disposition:   Status is: Inpatient  The patient will require care spanning > 2 midnights and should be moved to inpatient because: Inpatient level of care appropriate due to severity of illness  Dispo: The patient is from: Home              Anticipated d/c is to: TBD              Patient currently is not medically stable to d/c.   Difficult to place patient No       Consultants:  Neurology  Procedures: CT head 06/01/2021, 05/31/2021 MRI brain 05/31/2021 MRA head 05/31/2021 2D echo 06/01/2021 Carotid Dopplers pending 06/01/2021   Antimicrobials:  None   Subjective: Patient rexcieved '1mg'$  atrivan at 2106 houtrs yesterday night for MRI per RN. Patient letyhjargic.  Opens eyes to noxious stimuli but drifts back off to sleep.  Moving extremities spontaneously.  Objective: Vitals:   06/01/21 0000 06/01/21 0227 06/01/21 0448 06/01/21 0815  BP:  (!) 176/97 Marland Kitchen)  158/87 (!) 177/85  Pulse:  68 78 65  Resp:  '18 18 18  '$ Temp:  97.6 F (36.4 C) 97.7 F (36.5 C) 97.7 F (36.5 C)  TempSrc:  Oral Axillary Axillary  SpO2:  100% 99% 98%  Weight: 49.4 kg     Height: 5' 3.5" (1.613 m)      No intake or output data in the 24 hours  ending 06/01/21 1024 Filed Weights   06/01/21 0000  Weight: 49.4 kg    Examination:  General exam: Somnolent/lethargic. Respiratory system: Clear to auscultation anterior lung fields. Respiratory effort normal. Cardiovascular system: S1 & S2 heard, RRR. No JVD, murmurs, rubs, gallops or clicks. No pedal edema. Gastrointestinal system: Abdomen is nondistended, soft and nontender. No organomegaly or masses felt. Normal bowel sounds heard. Central nervous system: Lethargic.  Opens eyes to noxious stimuli and drifts back off to sleep.  Moving extremities spontaneously.  Extremities: Unable to assess due to mental status. Skin: No rashes, lesions or ulcers Psychiatry: Judgement and insight unable to assess due to mental status.      Data Reviewed: I have personally reviewed following labs and imaging studies  CBC: Recent Labs  Lab 05/31/21 1632 06/01/21 0330  WBC 10.0 9.0  NEUTROABS 4.9  --   HGB 12.5 12.6  HCT 40.5 40.1  MCV 89.2 87.6  PLT 239 [id number]    Basic Metabolic Panel: Recent Labs  Lab 05/31/21 1632 06/01/21 0330  NA 138 139  K 4.1 3.8  CL 101 101  CO2 27 27  GLUCOSE 128* 85  BUN 38* 34*  CREATININE 2.18* 2.05*  CALCIUM 9.8 9.7    GFR: Estimated Creatinine Clearance: 13.1 mL/min (A) (by C-G formula based on SCr of 2.05 mg/dL (H)).  Liver Function Tests: Recent Labs  Lab 06/01/21 0330  AST 20  ALT 20  ALKPHOS 73  BILITOT 1.4*  PROT 5.5*  ALBUMIN 3.3*    CBG: Recent Labs  Lab 05/31/21 1723  GLUCAP 110*     Recent Results (from the past 240 hour(s))  SARS CORONAVIRUS 2 (TAT 6-24 HRS) Nasopharyngeal Nasopharyngeal Swab     Status: None   Collection Time: 05/31/21  7:27 PM   Specimen: Nasopharyngeal Swab  Result Value Ref Range Status   SARS Coronavirus 2 NEGATIVE NEGATIVE Final    Comment: (NOTE) SARS-CoV-2 target nucleic acids are NOT DETECTED.  The SARS-CoV-2 RNA is generally detectable in upper and lower respiratory specimens during the  acute phase of infection. Negative results do not preclude SARS-CoV-2 infection, do not rule out co-infections with other pathogens, and should not be used as the sole basis for treatment or other patient management decisions. Negative results must be combined with clinical observations, patient history, and epidemiological information. The expected result is Negative.  Fact Sheet for Patients: SugarRoll.be  Fact Sheet for Healthcare Providers: https://www.woods-mathews.com/  This test is not yet approved or cleared by the [place] FDA and  has been authorized for detection and/or diagnosis of SARS-CoV-2 by FDA under an Emergency Use Authorization (EUA). This EUA will remain  in effect (meaning this test can be used) for the duration of the COVID-19 declaration under Se ction 564(b)(1) of the Act, 21 U.S.C. section 360bbb-3(b)(1), unless the authorization is terminated or revoked sooner.  Performed at [facility], [place] [address] [place], [place] [address]          Radiology Studies: CT HEAD WO CONTRAST  Result Date: 05/31/2021 CLINICAL DATA:  Altered mental status.  EXAM: CT HEAD WITHOUT CONTRAST TECHNIQUE: Contiguous axial images were obtained from the base of the skull through the vertex without intravenous contrast. COMPARISON:  None. FINDINGS: Brain: Mild age-related atrophy and chronic microvascular ischemic changes. There is no acute intracranial hemorrhage. No mass effect midline shift. No extra-axial fluid collection. Vascular: No hyperdense vessel or unexpected calcification. Skull: Normal. Negative for fracture or focal lesion. Sinuses/Orbits: No acute finding. Other: None IMPRESSION: 1. No acute intracranial pathology. 2. Mild age-related atrophy and chronic microvascular ischemic changes. Electronically Signed   By: Anner Crete M.D.   On: 05/31/2021 19:19   MR ANGIO HEAD WO CONTRAST  Result Date:  05/31/2021 CLINICAL DATA:  Acute neurologic deficit EXAM: MRI HEAD WITHOUT CONTRAST MRA HEAD WITHOUT CONTRAST TECHNIQUE: Multiplanar, multi-echo pulse sequences of the brain and surrounding structures were acquired without intravenous contrast. Angiographic images of the Circle of Willis were acquired using MRA technique without intravenous contrast. COMPARISON:  No pertinent prior exam. 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 No acute or chronic hemorrhage. There is multifocal hyperintense T2-weighted signal within the white matter. Generalized volume loss without a clear lobar predilection. The midline structures are normal. Vascular: Major flow voids are preserved. Skull and upper cervical spine: Normal calvarium and skull base. Visualized upper cervical spine and soft tissues are normal. Sinuses/Orbits:No paranasal sinus fluid levels or advanced mucosal thickening. No mastoid or middle ear effusion. Normal orbits. MRA HEAD FINDINGS POSTERIOR CIRCULATION: --Vertebral arteries: Normal --Inferior cerebellar arteries: Normal. --Basilar artery: Normal. --Superior cerebellar arteries: Normal. --Posterior cerebral arteries: Normal. ANTERIOR CIRCULATION: --Intracranial internal carotid arteries: Normal. --Anterior cerebral arteries (ACA): Normal. --Middle cerebral arteries (MCA): Normal. ANATOMIC VARIANTS: Fetal origin of both posterior cerebral arteries. IMPRESSION: 1. 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 No hemorrhage or mass effect. 2. Normal intracranial MRA. Electronically Signed   By: Ulyses Jarred M.D.   On: 05/31/2021 22:26   MR BRAIN WO CONTRAST  Result Date: 05/31/2021 CLINICAL DATA:  Acute neurologic deficit EXAM: MRI HEAD WITHOUT CONTRAST MRA HEAD WITHOUT CONTRAST TECHNIQUE: Multiplanar, multi-echo pulse sequences of the brain and surrounding structures were acquired without intravenous contrast. Angiographic images of the Circle of Willis were  acquired using MRA technique without intravenous contrast. COMPARISON:  No pertinent prior exam. 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 No acute or chronic hemorrhage. There is multifocal hyperintense T2-weighted signal within the white matter. Generalized volume loss without a clear lobar predilection. The midline structures are normal. Vascular: Major flow voids are preserved. Skull and upper cervical spine: Normal calvarium and skull base. Visualized upper cervical spine and soft tissues are normal. Sinuses/Orbits:No paranasal sinus fluid levels or advanced mucosal thickening. No mastoid or middle ear effusion. Normal orbits. MRA HEAD FINDINGS POSTERIOR CIRCULATION: --Vertebral arteries: Normal --Inferior cerebellar arteries: Normal. --Basilar artery: Normal. --Superior cerebellar arteries: Normal. --Posterior cerebral arteries: Normal. ANTERIOR CIRCULATION: --Intracranial internal carotid arteries: Normal. --Anterior cerebral arteries (ACA): Normal. --Middle cerebral arteries (MCA): Normal. ANATOMIC VARIANTS: Fetal origin of both posterior cerebral arteries. IMPRESSION: 1. 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 No hemorrhage or mass effect. 2. Normal intracranial MRA. Electronically Signed   By: Ulyses Jarred M.D.   On: 05/31/2021 22:26   VAS US CAROTID (at [facility] and [facility] only)  Result Date: 06/01/2021 Carotid Arterial Duplex Study Patient Name:  BAWI BISHARA  Date of Exam:   06/01/2021 Medical Rec #: [id number]  Accession #:    [id number] Date of Birth: 20-Nov-1924         Patient Gender: F Patient Age:   77 years Exam Location:  [facility] Procedure:      VAS US CAROTID Referring Phys: Gean Birchwood --------------------------------------------------------------------------------  Indications:  CVA. Risk Factors: Hypertension, hyperlipidemia, coronary artery disease. Performing Technologist: Archie Patten RVS  Examination Guidelines: A  complete evaluation includes B-mode imaging, spectral Doppler, color Doppler, and power Doppler as needed of all accessible portions of each vessel. Bilateral testing is considered an integral part of a complete examination. Limited examinations for reoccurring indications may be performed as noted.  Right Carotid Findings: +----------+--------+--------+--------+------------------+--------+           PSV cm/sEDV cm/sStenosisPlaque DescriptionComments +----------+--------+--------+--------+------------------+--------+ CCA Prox  30      8               heterogenous               +----------+--------+--------+--------+------------------+--------+ CCA Distal25      7               heterogenous               +----------+--------+--------+--------+------------------+--------+ ICA Prox  57      20      1-39%   heterogenous               +----------+--------+--------+--------+------------------+--------+ ICA Distal34      11                                         +----------+--------+--------+--------+------------------+--------+ ECA       52      6                                          +----------+--------+--------+--------+------------------+--------+ +----------+--------+-------+--------+-------------------+           PSV cm/sEDV cmsDescribeArm Pressure (mmHG) +----------+--------+-------+--------+-------------------+ [id number]                                         +----------+--------+-------+--------+-------------------+ +---------+--------+--+--------+-+---------+ VertebralPSV cm/s21EDV cm/s5Antegrade +---------+--------+--+--------+-+---------+  Left Carotid Findings: +----------+--------+--------+--------+------------------+--------+           PSV cm/sEDV cm/sStenosisPlaque DescriptionComments +----------+--------+--------+--------+------------------+--------+ CCA Prox  31      7               heterogenous                +----------+--------+--------+--------+------------------+--------+ CCA Distal28      6               heterogenous               +----------+--------+--------+--------+------------------+--------+ ICA Prox  24      10      1-39%   heterogenous               +----------+--------+--------+--------+------------------+--------+ ICA Distal25      6                                          +----------+--------+--------+--------+------------------+--------+ ECA  44                                                 +----------+--------+--------+--------+------------------+--------+ +----------+--------+--------+--------+-------------------+           PSV cm/sEDV cm/sDescribeArm Pressure (mmHG) +----------+--------+--------+--------+-------------------+ [id number]                                          +----------+--------+--------+--------+-------------------+ +---------+--------+--+--------+-+---------+ VertebralPSV cm/s22EDV cm/s5Antegrade +---------+--------+--+--------+-+---------+   Summary: Right Carotid: Velocities in the right ICA are consistent with a 1-39% stenosis. Left Carotid: Velocities in the left ICA are consistent with a 1-39% stenosis. Vertebrals: Bilateral vertebral arteries demonstrate antegrade flow. *See table(s) above for measurements and observations.     Preliminary         Scheduled Meds:   stroke: mapping our early stages of recovery book   Does not apply Once   aspirin  300 mg Rectal Daily   Or   aspirin  325 mg Oral Daily   atorvastatin  40 mg Oral Daily   enoxaparin (LOVENOX) injection  25 mg Subcutaneous Q24H   ezetimibe  10 mg Oral Daily   Continuous Infusions:   LOS: 0 days    Time spent: 35 minutes    Irine Seal, MD [facility]   To contact the attending provider between 7A-7P or the covering provider during after hours 7P-7A, please log into the web site [email or web address] and access using universal  [place] password for that web site. If you do not have the password, please call the hospital operator.  06/01/2021, 10:24 AM

## 2021-06-01 NOTE — Progress Notes (Signed)
Spoke to Dr Marlowe Sax on pt status was told if pt responded for short time and vital signs normal Ativan still in its effects ad also spoke to Dr Russella Dar Neurologist agreed shlf life ativan 8 to 12 hours and as long as pt responded to name and place pt heading in rught direction Will continue to monitor ot.

## 2021-06-01 NOTE — Progress Notes (Signed)
  Echocardiogram 2D Echocardiogram has been performed.  Heather Saunders 06/01/2021, 9:51 AM

## 2021-06-01 NOTE — Progress Notes (Signed)
SLP Cancellation Note  Patient Details Name: Heather Saunders MRN: [id number] DOB: 03-Oct-1924   Cancelled treatment:       Reason Eval/Treat Not Completed: Fatigue/lethargy limiting ability to participate; pt rousable only briefly to stimulation, opening eyes but non-verbal during Dr. Biagio Borg efforts to assess her.  Will follow for ability to participate in evaluation.  Arrie Zuercher L. Tivis Ringer, [place] Office number [phone] Pager [phone]    [name] 06/01/2021, 10:24 AM

## 2021-06-02 DIAGNOSIS — N184 Chronic kidney disease, stage 4 (severe): Secondary | ICD-10-CM | POA: Diagnosis not present

## 2021-06-02 DIAGNOSIS — I63312 Cerebral infarction due to thrombosis of left middle cerebral artery: Secondary | ICD-10-CM | POA: Diagnosis not present

## 2021-06-02 DIAGNOSIS — I4891 Unspecified atrial fibrillation: Secondary | ICD-10-CM | POA: Diagnosis not present

## 2021-06-02 DIAGNOSIS — I10 Essential (primary) hypertension: Secondary | ICD-10-CM | POA: Diagnosis not present

## 2021-06-02 DIAGNOSIS — I272 Pulmonary hypertension, unspecified: Secondary | ICD-10-CM

## 2021-06-02 DIAGNOSIS — I482 Chronic atrial fibrillation, unspecified: Secondary | ICD-10-CM

## 2021-06-02 DIAGNOSIS — G9340 Encephalopathy, unspecified: Secondary | ICD-10-CM | POA: Diagnosis not present

## 2021-06-02 LAB — COMPREHENSIVE METABOLIC PANEL
ALT: 17 U/L (ref 0–44)
AST: 22 U/L (ref 15–41)
Albumin: 3 g/dL — ABNORMAL LOW (ref 3.5–5.0)
Alkaline Phosphatase: 67 U/L (ref 38–126)
Anion gap: 8 (ref 5–15)
BUN: 28 mg/dL — ABNORMAL HIGH (ref 8–23)
CO2: 24 mmol/L (ref 22–32)
Calcium: 9.3 mg/dL (ref 8.9–10.3)
Chloride: 108 mmol/L (ref 98–111)
Creatinine, Ser: 1.87 mg/dL — ABNORMAL HIGH (ref 0.44–1.00)
GFR, Estimated: 25 mL/min — ABNORMAL LOW (ref >60)
Glucose, Bld: 128 mg/dL — ABNORMAL HIGH (ref 70–99)
Potassium: 3.6 mmol/L (ref 3.5–5.1)
Sodium: 140 mmol/L (ref 135–145)
Total Bilirubin: 1.7 mg/dL — ABNORMAL HIGH (ref 0.3–1.2)
Total Protein: 5.3 g/dL — ABNORMAL LOW (ref 6.5–8.1)

## 2021-06-02 LAB — URINE CULTURE: Culture: NO GROWTH

## 2021-06-02 LAB — GLUCOSE, CAPILLARY
Glucose-Capillary: 106 mg/dL — ABNORMAL HIGH (ref 70–99)
Glucose-Capillary: 107 mg/dL — ABNORMAL HIGH (ref 70–99)
Glucose-Capillary: 121 mg/dL — ABNORMAL HIGH (ref 70–99)
Glucose-Capillary: 129 mg/dL — ABNORMAL HIGH (ref 70–99)
Glucose-Capillary: 180 mg/dL — ABNORMAL HIGH (ref 70–99)
Glucose-Capillary: 228 mg/dL — ABNORMAL HIGH (ref 70–99)

## 2021-06-02 LAB — MAGNESIUM: Magnesium: 2.1 mg/dL (ref 1.7–2.4)

## 2021-06-02 MED ORDER — ASPIRIN EC 81 MG PO TBEC
81.0000 mg | DELAYED_RELEASE_TABLET | Freq: Every day | ORAL | Status: DC
  Administered 2021-06-03 – 2021-06-09 (×7): 81 mg via ORAL
  Filled 2021-06-02 (×9): qty 1

## 2021-06-02 MED ORDER — APIXABAN 2.5 MG PO TABS
2.5000 mg | ORAL_TABLET | Freq: Two times a day (BID) | ORAL | Status: DC
  Administered 2021-06-02 – 2021-06-14 (×25): 2.5 mg via ORAL
  Filled 2021-06-02 (×26): qty 1

## 2021-06-02 MED ORDER — GLUCOSE 4 G PO CHEW
CHEWABLE_TABLET | ORAL | Status: AC
  Filled 2021-06-02: qty 1

## 2021-06-02 MED ORDER — METOPROLOL TARTRATE 50 MG PO TABS
100.0000 mg | ORAL_TABLET | Freq: Two times a day (BID) | ORAL | Status: DC
  Administered 2021-06-02 – 2021-06-03 (×3): 100 mg via ORAL
  Filled 2021-06-02 (×3): qty 2

## 2021-06-02 NOTE — Evaluation (Signed)
Speech Language Pathology Evaluation Patient Details Name: Heather Saunders MRN: [id number] DOB: January 16, 1925 Today's Date: 06/02/2021 Time: [id number] SLP Time Calculation (min) (ACUTE ONLY): 12 min  Problem List:  Patient Active Problem List   Diagnosis Date Noted   CVA (cerebral vascular accident) ([place]) 06/01/2021   CKD (chronic kidney disease), stage IV (HCC)    Chronic diastolic congestive heart failure ([place])    Acute encephalopathy 05/31/2021   Atrial fibrillation ([place]) 05/31/2021   Acute kidney injury ([place])    Pulmonary HTN ([place])    Right atrial mass    Acute diastolic CHF (congestive heart failure) ([place])    Atrial fibrillation with RVR ([place]) 03/18/2021   AKI (acute kidney injury) ([place]) 03/18/2021   Essential hypertension 08/03/2018   Coronary artery disease involving native coronary artery of native heart without angina pectoris 08/03/2018   Dyspnea on exertion 08/03/2018   Mixed hyperlipidemia 08/03/2018   Past Medical History:  Past Medical History:  Diagnosis Date   Chronic kidney disease    Coronary disease    NonObstructive   History of nuclear stress test    Nuclear stress test 10/18: EF 96, normal perfusion, low risk   Hyperlipidemia    Hypertension    Myocardial infarct [facility]) 2006   Osteopenia    Osteoporosis    Past Surgical History:  Past Surgical History:  Procedure Laterality Date   LIPOMA EXCISION     removed from Shoulder   TEE WITHOUT CARDIOVERSION N/A 03/20/2021   Procedure: TRANSESOPHAGEAL ECHOCARDIOGRAM (TEE);  Surgeon: Donato Heinz, MD;  Location: [facility] ENDOSCOPY;  Service: Cardiovascular;  Laterality: N/A;   HPI:  87 y.o. female presents to [facility] ED on 05/31/2021 with confusion and slurred speech. MRI demonstrates small acute infarct of the left caudate nucleus. PMH includes atrial fibrillation, pulmonary hypertension, chronic kidney disease stage IV, anemia   Assessment / Plan / Recommendation Clinical Impression  Pt demonstrated both  communication and cognitive changes with stroke that are different from baseline. She lived alone with caregiver during the day and now appears decreased from typical functioning from cognitve-language standpoint (OT spoke w/ son). Her speech contains hesitations however mostly fluent with semantic paraphasias, several  neologisms (unaware of errors) and tangential. Comprehension is affected with 80% 1 step commands (motor apraxia), In general conversation she would respond with irrelevant or perseverative utterances. Cognitive impairments ntoed in sustained attention (distractible with activities in the hall), verbal and basic problem solving and awareness. She is not safe presently or independent form cognitive-communicative standpoint and need 24 hour supervision. ST will follow while on acute.    SLP Assessment  SLP Recommendation/Assessment: Patient needs continued Speech Lanaguage Pathology Services SLP Visit Diagnosis: Cognitive communication deficit (R41.841)    Follow Up Recommendations  Skilled Nursing facility    Frequency and Duration min 2x/week  2 weeks      SLP Evaluation Cognition  Overall Cognitive Status: Impaired/Different from baseline Arousal/Alertness: Awake/alert Orientation Level: Oriented to person;Disoriented to place;Disoriented to situation;Other (comment) (oriented to year only) Attention: Sustained Sustained Attention: Impaired Sustained Attention Impairment: Verbal basic;Functional basic Memory: Impaired Memory Impairment: Decreased recall of new information Awareness: Impaired Awareness Impairment: Intellectual impairment;Emergent impairment;Anticipatory impairment Problem Solving: Impaired Problem Solving Impairment: Functional basic;Verbal basic Safety/Judgment: Impaired       Comprehension  Auditory Comprehension Overall Auditory Comprehension: Impaired Commands: Impaired One Step Basic Commands: 50-74% accurate Two Step Basic Commands: 25-49%  accurate Interfering Components: Attention Visual Recognition/Discrimination Discrimination: Not tested Reading Comprehension Reading Status: Not tested  Expression Expression Primary Mode of Expression: Verbal Verbal Expression Overall Verbal Expression: Impaired Initiation: No impairment Level of Generative/Spontaneous Verbalization: Sentence Repetition:  (NT- suspect WNL) Naming:  (4/4 common objects) Pragmatics: No impairment Written Expression Dominant Hand: Right Written Expression: Not tested   Oral / Motor  Oral Motor/Sensory Function Overall Oral Motor/Sensory Function: Within functional limits Motor Speech Overall Motor Speech: Appears within functional limits for tasks assessed Articulation: Within functional limitis Intelligibility: Intelligible Motor Planning: Witnin functional limits   GO                    Houston Siren 06/02/2021, 2:09 PM  Orbie Pyo Tonnya Garbett M.Ed Risk analyst [phone] Office [phone]

## 2021-06-02 NOTE — Evaluation (Signed)
Occupational Therapy Evaluation Patient Details Name: Heather Saunders MRN: [id number] DOB: April 14, 1925 Today's Date: 06/02/2021    History of Present Illness Pt is a 87 yr old female who presented due to change in cognition. Pt found to have an acute left caudate nucleus CVA.  PMH but to limted to: pulmonary HTN, HTNN, afib, CKD, chronic diastolic congestive heart faluire   Clinical Impression   Pt presented in bed and agreed to therapy session. Pt was alert but had difficulties in session with word finding, recall, judgment and orientation to place/situation. Pt was able to complete bed mobility from supine to sitting with min guard and min assist to complete sit to stand transfers to RW. Pt noted had difficulties with coordination with BLE and walker use and requiring min assist. Pt currently with functional limitations due to the deficits listed below (see OT Problem List).  Pt will benefit from skilled OT to increase their safety and independence with ADL and functional mobility for ADL to facilitate discharge to venue listed below.      Follow Up Recommendations  SNF;Supervision/Assistance - 24 hour    Equipment Recommendations       Recommendations for Other Services       Precautions / Restrictions Precautions Precautions: Fall Restrictions Weight Bearing Restrictions: No      Mobility Bed Mobility Overal bed mobility: Needs Assistance Bed Mobility: Supine to Sit     Supine to sit: HOB elevated;Min guard     General bed mobility comments: increase time to cue    Transfers Overall transfer level: Needs assistance Equipment used: Rolling walker (2 wheeled) Transfers: Sit to/from Stand Sit to Stand: Min guard         General transfer comment: Pt increase in time to motor plan and process    Balance Overall balance assessment: Needs assistance Sitting-balance support: Feet supported Sitting balance-Leahy Scale: Fair     Standing balance support: Bilateral  upper extremity supported Standing balance-Leahy Scale: Fair                             ADL either performed or assessed with clinical judgement   ADL Overall ADL's : Needs assistance/impaired Eating/Feeding:  (NPO)   Grooming: Wash/dry hands;Wash/dry face;Set up;Cueing for safety;Cueing for sequencing;Sitting   Upper Body Bathing: Min guard;Cueing for sequencing;Cueing for safety;Sitting   Lower Body Bathing: Moderate assistance;Cueing for safety;Cueing for sequencing;Sit to/from stand   Upper Body Dressing : Min guard;Cueing for safety;Cueing for sequencing;Sitting   Lower Body Dressing: Moderate assistance;Cueing for safety;Cueing for sequencing;Sit to/from stand   Toilet Transfer: Minimal assistance;Cueing for safety;Cueing for sequencing;Ambulation;RW   Toileting- Clothing Manipulation and Hygiene: Moderate assistance;Cueing for safety;Cueing for sequencing;Sit to/from stand       Functional mobility during ADLs: Minimal assistance;Rolling walker;Cueing for sequencing;Cueing for safety General ADL Comments: Pt has decrease in ability to sequence through tasks     Vision Baseline Vision/History: 1 Wears glasses Ability to See in Adequate Light: 0 Adequate       Perception     Praxis      Pertinent Vitals/Pain Pain Assessment: No/denies pain     Hand Dominance Right   Extremity/Trunk Assessment Upper Extremity Assessment Upper Extremity Assessment: Overall WFL for tasks assessed   Lower Extremity Assessment Lower Extremity Assessment: Defer to PT evaluation   Cervical / Trunk Assessment Cervical / Trunk Assessment: Kyphotic   Communication Communication Communication: HOH   Cognition Arousal/Alertness: Awake/alert Behavior During Therapy: Anxious  Overall Cognitive Status: Impaired/Different from baseline (spoke to nephew about PLOF and reported changes) Area of Impairment: Orientation;Attention;Memory;Following  commands;Safety/judgement;Awareness;Problem solving                 Orientation Level: Disoriented to;Place;Situation Current Attention Level: Alternating Memory: Decreased short-term memory Following Commands: Follows one step commands inconsistently Safety/Judgement: Decreased awareness of safety   Problem Solving: Slow processing;Requires verbal cues     General Comments       Exercises     Shoulder Instructions      Home Living Family/patient expects to be discharged to:: Private residence Living Arrangements: Alone Available Help at Discharge: Friend(s);Available PRN/intermittently Type of Home: Apartment Home Access: Level entry     Home Layout: One level     Bathroom Shower/Tub: Teacher, early years/pre: Standard Bathroom Accessibility: Yes   Home Equipment: Environmental consultant - 2 wheels;Grab bars - tub/shower   Additional Comments: information taken by phone with nephew      Prior Functioning/Environment Level of Independence: Needs assistance  Gait / Transfers Assistance Needed: per nephew had a walker and used for a day or two when in rehab in summer ADL's / Homemaking Assistance Needed: pt has an aide 3 x week for about a hour and family friends            OT Problem List: Decreased strength;Decreased range of motion;Decreased activity tolerance;Impaired balance (sitting and/or standing);Decreased cognition;Decreased safety awareness;Decreased knowledge of use of DME or AE;Decreased knowledge of precautions      OT Treatment/Interventions: Self-care/ADL training;Therapeutic exercise;Neuromuscular education;Cognitive remediation/compensation;Therapeutic activities;Patient/family education;Balance training    OT Goals(Current goals can be found in the care plan section) Acute Rehab OT Goals Patient Stated Goal: unable to state OT Goal Formulation: With patient Time For Goal Achievement: 06/16/21 Potential to Achieve Goals: Good ADL Goals Pt  Will Perform Upper Body Bathing: with modified independence;sitting Pt Will Perform Lower Body Bathing: with set-up;sit to/from stand Pt Will Transfer to Toilet: with supervision;ambulating;regular height toilet Pt Will Perform Tub/Shower Transfer: with supervision;ambulating;shower seat;rolling walker  OT Frequency: Min 2X/week   Barriers to D/C:            Co-evaluation              AM-PAC OT "6 Clicks" Daily Activity     Outcome Measure Help from another person eating meals?: None Help from another person taking care of personal grooming?: A Little Help from another person toileting, which includes using toliet, bedpan, or urinal?: A Lot Help from another person bathing (including washing, rinsing, drying)?: A Lot Help from another person to put on and taking off regular upper body clothing?: A Little Help from another person to put on and taking off regular lower body clothing?: A Lot 6 Click Score: 16   End of Session Equipment Utilized During Treatment: Gait belt;Rolling walker Nurse Communication: Mobility status  Activity Tolerance: Patient tolerated treatment well Patient left: in chair;with call bell/phone within reach;with chair alarm set  OT Visit Diagnosis: Unsteadiness on feet (R26.81);Other abnormalities of gait and mobility (R26.89);Muscle weakness (generalized) (M62.81)                Time: [id number] OT Time Calculation (min): 33 min Charges:  OT General Charges $OT Visit: 1 Visit OT Evaluation $OT Eval Low Complexity: 1 Low OT Treatments $Self Care/Home Management : 8-22 mins  Joeseph Amor OTR/L  Acute Rehab Services  [phone] office number [phone] pager number   Joeseph Amor 06/02/2021, 12:55 PM

## 2021-06-02 NOTE — Progress Notes (Signed)
STROKE TEAM PROGRESS NOTE   INTERVAL HISTORY No family at bedside, patient sitting in chair, eating late lunch.  Patient has hard of hearing but awake alert, orientated to place, but not orientated to age or time.  No focal deficit.  On diet now, will resume Eliquis.  Given small vessel stroke weeks Eliquis, will add on baby aspirin.  PT/OT recommend SNF.  Vitals:   06/01/21 1552 06/01/21 2011 06/02/21 0011 06/02/21 0434  BP: (!) 182/85 (!) 174/81 107/76 (!) 168/96  Pulse: 66 89 96 91  Resp: '14 16 16 16  '$ Temp: (!) 97.3 F (36.3 C) 97.7 F (36.5 C) 98.5 F (36.9 C) 98.4 F (36.9 C)  TempSrc: Axillary Oral Oral Oral  SpO2: 97% 98% 99% 100%  Weight:      Height:       CBC:  Recent Labs  Lab 05/31/21 1632 06/01/21 0330  WBC 10.0 9.0  NEUTROABS 4.9  --   HGB 12.5 12.6  HCT 40.5 40.1  MCV 89.2 87.6  PLT 239 [id number]   Basic Metabolic Panel:  Recent Labs  Lab 05/31/21 1632 06/01/21 0330  NA 138 139  K 4.1 3.8  CL 101 101  CO2 27 27  GLUCOSE 128* 85  BUN 38* 34*  CREATININE 2.18* 2.05*  CALCIUM 9.8 9.7    Lipid Panel:  Recent Labs  Lab 06/01/21 0330  CHOL 153  TRIG 103  HDL 44  CHOLHDL 3.5  VLDL 21  LDLCALC 88    HgbA1c:  Recent Labs  Lab 06/01/21 0330  HGBA1C 5.7*   Urine Drug Screen: No results for input(s): LABOPIA, COCAINSCRNUR, LABBENZ, AMPHETMU, THCU, LABBARB in the last 168 hours.  Alcohol Level No results for input(s): ETH in the last 168 hours.  IMAGING past 24 hours CT HEAD WO CONTRAST (5MM)  Result Date: 06/01/2021 CLINICAL DATA:  Neuro deficit EXAM: CT HEAD WITHOUT CONTRAST TECHNIQUE: Contiguous axial images were obtained from the base of the skull through the vertex without intravenous contrast. COMPARISON:  CT head 05/31/2021 FINDINGS: Brain: There is hypodensity centered in the left caudate body extending towards the lentiform nucleus consistent with evolving subacute infarct as seen on the prior MRI. There is no evidence of hemorrhagic  conversion. There is no new infarct. There is no evidence of acute intracranial hemorrhage or extra-axial fluid collection. Mild parenchymal volume loss is unchanged. Remote infarcts in the bilateral basal ganglia are unchanged. There is no mass lesion. There is no midline shift. The ventricles are stable in size. Vascular: No hyperdense vessel or unexpected calcification. Skull: Normal. Negative for fracture or focal lesion. Sinuses/Orbits: The paranasal sinuses are clear. Bilateral lens implants are in place. The globes and orbits are otherwise unremarkable. Other: None. IMPRESSION: 1. Evolving subacute infarct in the left caudate body without evidence of hemorrhagic transformation. 2. No new infarct or hemorrhage. Electronically Signed   By: Valetta Mole M.D.   On: 06/01/2021 14:37   ECHOCARDIOGRAM COMPLETE  Result Date: 06/01/2021    ECHOCARDIOGRAM REPORT   Patient Name:   Heather Saunders Date of Exam: 06/01/2021 Medical Rec #:  [id number]        Height:       63.5 in Accession #:    [id number]       Weight:       109.0 lb Date of Birth:  09-15-1925        BSA:          1.503 m Patient Age:    87  years         BP:           158/87 mmHg Patient Gender: F                HR:           70 bpm. Exam Location:  Inpatient Procedure: 2D Echo, Cardiac Doppler and Color Doppler Indications:    Stroke I63.9  History:        Patient has prior history of Echocardiogram examinations, most                 recent 05/16/2021. Previous Myocardial Infarction; Risk                 Factors:Dyslipidemia and Hypertension.  Sonographer:    Bernadene Person RDCS Referring Phys: [place]  1. Left ventricular ejection fraction, by estimation, is 55 to 60%. The left ventricle has normal function. The left ventricle has no regional wall motion abnormalities. Left ventricular diastolic parameters are consistent with Grade I diastolic dysfunction (impaired relaxation).  2. Right ventricular systolic function is  mildly reduced. The right ventricular size is normal. There is mildly elevated pulmonary artery systolic pressure.  3. The mitral valve is grossly normal. No evidence of mitral valve regurgitation.  4. The aortic valve is tricuspid. Aortic valve regurgitation is trivial. Aortic valve sclerosis is present, with no evidence of aortic valve stenosis.  5. The inferior vena cava is dilated in size with >50% respiratory variability, suggesting right atrial pressure of 8 mmHg.  6. Tricuspid valve regurgitation is mild to moderate. Comparison(s): A prior study was performed on 05/16/21. Prior RVSP 65 mm Hg. FINDINGS  Left Ventricle: Left ventricular ejection fraction, by estimation, is 55 to 60%. The left ventricle has normal function. The left ventricle has no regional wall motion abnormalities. The left ventricular internal cavity size was small. There is no left ventricular hypertrophy. Left ventricular diastolic parameters are consistent with Grade I diastolic dysfunction (impaired relaxation). Right Ventricle: The right ventricular size is normal. No increase in right ventricular wall thickness. Right ventricular systolic function is mildly reduced. There is mildly elevated pulmonary artery systolic pressure. The tricuspid regurgitant velocity  is 2.78 m/s, and with an assumed right atrial pressure of 8 mmHg, the estimated right ventricular systolic pressure is [id number]. Left Atrium: Left atrial size was normal in size. Right Atrium: Right atrial size was normal in size. Pericardium: Trivial pericardial effusion is present. Mitral Valve: The mitral valve is grossly normal. No evidence of mitral valve regurgitation. Tricuspid Valve: The tricuspid valve is normal in structure. Tricuspid valve regurgitation is mild to moderate. Aortic Valve: The aortic valve is tricuspid. There is mild calcification of the aortic valve. There is mild thickening of the aortic valve. Aortic valve regurgitation is trivial. Aortic  regurgitation PHT measures 932 msec. Mild aortic valve sclerosis is present, with no evidence of aortic valve stenosis. Pulmonic Valve: The pulmonic valve was not well visualized. Pulmonic valve regurgitation is mild to moderate. No evidence of pulmonic stenosis. Aorta: The aortic root and ascending aorta are structurally normal, with no evidence of dilitation. Venous: The inferior vena cava is dilated in size with greater than 50% respiratory variability, suggesting right atrial pressure of 8 mmHg. IAS/Shunts: The atrial septum is grossly normal.  LEFT VENTRICLE PLAX 2D LVIDd:         3.10 cm  Diastology LVIDs:         2.20 cm  LV  e' medial:    3.09 cm/s LV PW:         1.10 cm  LV E/e' medial:  11.4 LV IVS:        0.90 cm  LV e' lateral:   3.32 cm/s LVOT diam:     2.00 cm  LV E/e' lateral: 10.6 LV SV:         54 LV SV Index:   36 LVOT Area:     3.14 cm  RIGHT VENTRICLE RV S prime:     6.98 cm/s TAPSE (M-mode): 1.3 cm LEFT ATRIUM             Index       RIGHT ATRIUM           Index LA diam:        3.20 cm 2.13 cm/m  RA Area:     11.20 cm LA Vol (A2C):   38.8 ml 25.82 ml/m RA Volume:   25.30 ml  16.84 ml/m LA Vol (A4C):   38.7 ml 25.75 ml/m LA Biplane Vol: 39.5 ml 26.28 ml/m  AORTIC VALVE             PULMONIC VALVE LVOT Vmax:   88.93 cm/s  PR End Diast Vel: 9.61 msec LVOT Vmean:  60.933 cm/s LVOT VTI:    0.171 m AI PHT:      932 msec  AORTA Ao Root diam: 3.10 cm Ao Asc diam:  3.30 cm MITRAL VALVE               TRICUSPID VALVE MV Area (PHT): 3.72 cm    TR Peak grad:   30.9 mmHg MV Decel Time: 204 msec    TR Vmax:        278.00 cm/s MV E velocity: 35.20 cm/s MV A velocity: 84.20 cm/s  SHUNTS MV E/A ratio:  0.42        Systemic VTI:  0.17 m                            Systemic Diam: 2.00 cm Rudean Haskell MD Electronically signed by Rudean Haskell MD Signature Date/Time: 06/01/2021/12:16:58 PM    Final    VAS US CAROTID (at [facility] and [facility] only)  Result Date: 06/01/2021 Carotid Arterial Duplex Study  Patient Name:  Heather Saunders  Date of Exam:   06/01/2021 Medical Rec #: [id number]         Accession #:    [id number] Date of Birth: Sep 10, 1925         Patient Gender: F Patient Age:   87 years Exam Location:  [facility] Procedure:      VAS US CAROTID Referring Phys: Gean Birchwood --------------------------------------------------------------------------------  Indications:  CVA. Risk Factors: Hypertension, hyperlipidemia, coronary artery disease. Performing Technologist: Archie Patten RVS  Examination Guidelines: A complete evaluation includes B-mode imaging, spectral Doppler, color Doppler, and power Doppler as needed of all accessible portions of each vessel. Bilateral testing is considered an integral part of a complete examination. Limited examinations for reoccurring indications may be performed as noted.  Right Carotid Findings: +----------+--------+--------+--------+------------------+--------+           PSV cm/sEDV cm/sStenosisPlaque DescriptionComments +----------+--------+--------+--------+------------------+--------+ CCA Prox  30      8               heterogenous               +----------+--------+--------+--------+------------------+--------+ CCA Distal25  7               heterogenous               +----------+--------+--------+--------+------------------+--------+ ICA Prox  57      20      1-39%   heterogenous               +----------+--------+--------+--------+------------------+--------+ ICA Distal34      11                                         +----------+--------+--------+--------+------------------+--------+ ECA       52      6                                          +----------+--------+--------+--------+------------------+--------+ +----------+--------+-------+--------+-------------------+           PSV cm/sEDV cmsDescribeArm Pressure (mmHG) +----------+--------+-------+--------+-------------------+ [id number]                                          +----------+--------+-------+--------+-------------------+ +---------+--------+--+--------+-+---------+ VertebralPSV cm/s21EDV cm/s5Antegrade +---------+--------+--+--------+-+---------+  Left Carotid Findings: +----------+--------+--------+--------+------------------+--------+           PSV cm/sEDV cm/sStenosisPlaque DescriptionComments +----------+--------+--------+--------+------------------+--------+ CCA Prox  31      7               heterogenous               +----------+--------+--------+--------+------------------+--------+ CCA Distal28      6               heterogenous               +----------+--------+--------+--------+------------------+--------+ ICA Prox  24      10      1-39%   heterogenous               +----------+--------+--------+--------+------------------+--------+ ICA Distal25      6                                          +----------+--------+--------+--------+------------------+--------+ ECA       44                                                 +----------+--------+--------+--------+------------------+--------+ +----------+--------+--------+--------+-------------------+           PSV cm/sEDV cm/sDescribeArm Pressure (mmHG) +----------+--------+--------+--------+-------------------+ [id number]                                          +----------+--------+--------+--------+-------------------+ +---------+--------+--+--------+-+---------+ VertebralPSV cm/s22EDV cm/s5Antegrade +---------+--------+--+--------+-+---------+   Summary: Right Carotid: Velocities in the right ICA are consistent with a 1-39% stenosis. Left Carotid: Velocities in the left ICA are consistent with a 1-39% stenosis. Vertebrals: Bilateral vertebral arteries demonstrate antegrade flow. *See table(s) above for measurements and observations.  Electronically signed by Antony Contras MD on 06/01/2021 at 5:17:25 PM.    Final  PHYSICAL EXAM  Temp:  [97.7 F (36.5 C)-98.5 F (36.9 C)] 98.4 F (36.9 C) (09/03 1232) Pulse Rate:  [89-102] 102 (09/03 1232) Resp:  [16] 16 (09/03 1232) BP: (107-174)/(76-106) 154/106 (09/03 1232) SpO2:  [98 %-100 %] 100 % (09/03 1232)  General - Well nourished, well developed, in no apparent distress.  Ophthalmologic - fundi not visualized due to noncooperation.  Cardiovascular - irregularly irregular heart rate and rhythm.  Neuro - awake, alert, eyes open, orientated to place and self, not orientated to age or time. No aphasia, but paucity of speech, following most simple commands in consideration of severe hard of hearing.  Able to name, however difficulty with repeating due to severe hard of hearing.  No gaze palsy, tracking bilaterally, blinking to visual threat bilaterally, PERRL. No facial droop. Tongue midline. Bilateral UEs 4+/5, no drift. Bilaterally LEs 3/5 symmetrically. Sensation symmetrical bilaterally, FTN not quite corporative due to severe hard of hearing, gait not tested.    ASSESSMENT/PLAN Ms. Heather Saunders is a 87 y.o. female with history of   afib on eliquis 2.'5mg'$  bid, hx of right atrial thrombus, CKD 4, HTN, MI, osteoporosis who presented to the ED with confusion.   Stroke:  left caudate head infarct likely secondary to small vessel disease source CT head No acute abnormality.  Small vessel disease. Atrophy.   MRI BRAIN:  Small acute infarct of the left caudate nucleus. No hemorrhage or mass effect. MRA  Normal intracranial MRA.  Carotid Doppler unremarkable 2D Echo LVEF 55-60%  LDL 88 HgbA1c 5.7 VTE prophylaxis -Eliquis Eliquis (apixaban) daily prior to admission, now on Eliquis 2.5 twice daily.  Given small vessel disease in the setting of Eliquis, recommend aspirin 81 on top of Eliquis for further stroke prevention. Therapy recommendations:  SNF/ wheelchair/hospital  bed Disposition: Pending  Chronic A. Fib EKG yesterday showed A. Fib Rate  controlled On Eliquis 2.5 prior admission, now resumed Eliquis 2.5 twice daily.  Continue on discharge.  Hypertension Home meds:  cardizem, lasix, lopressor Stable Metoprolol resumed Gradually normalize BP in 2 to 3 days Long-term BP goal normotensive  Hyperlipidemia Home meds:  lipitor '40mg'$ , zetia '10mg'$ ,  resumed in hospital LDL 88, goal < 70 Continue statin at same dose given advanced age Continues etiology on discharge  Other Stroke Risk Factors  Advanced Age >/= 15  History of right atrial clot CAD/MI  Other Active Problems CKD stage IIIb, creatinine 2.05  Hospital day # 1  Neurology will sign off. Please call with questions. Pt will follow up with stroke clinic NP at [facility] in about 4 weeks. Thanks for the consult.  Rosalin Hawking, MD PhD Stroke Neurology 06/02/2021 4:14 PM     To contact Stroke Continuity provider, please refer to [email or web address] After hours, contact General Neurology

## 2021-06-02 NOTE — Progress Notes (Signed)
PROGRESS NOTE    Lanyia Margulis  [id number] DOB: 29-May-1925 DOA: 05/31/2021 PCP: Kathyrn Lass, MD    Chief Complaint  Patient presents with   Memory Loss    Brief Narrative:  Patient 87 year old female history of A. fib on Eliquis, pulmonary hypertension, chronic kidney disease stage IV, anemia, presented to the ED with confusion as noted by home health aide with some associated slurred speech.  Work-up in the ED with head CT being unremarkable, MRI head consistent with acute CVA.  Patient admitted for stroke work-up.   Assessment & Plan:   Principal Problem:   CVA (cerebral vascular accident) ([place]) Active Problems:   Essential hypertension   Pulmonary HTN ([place])   Acute encephalopathy   Atrial fibrillation (HCC)   CKD (chronic kidney disease), stage IV (HCC)   Chronic diastolic congestive heart failure ([place])  #1 acute left caudate nucleus CVA/acute metabolic encephalopathy -Patient presented with confusion and slurred speech. -Head CT done on admission negative for any acute abnormalities. -MRI brain/MRA head done consistent with small acute infarct of the left caudate nucleus, no hemorrhage or mass-effect.  Normal intracranial MRA. -Per neurology patient with left subcortical infarct likely cardioembolic in nature from A. fib despite being on anticoagulation with Eliquis 2.5 mg twice daily. -2D echo done with normal EF, no source of emboli. -Carotid Dopplers done with no significant ICA stenosis. -Patient noted to have significant lethargy on 06/01/2021 after patient noted to have received IV Ativan the evening of 05/31/2021 prior to MRI.  -Patient more alert today, following simple commands. -Repeat head CT done with no hemorrhagic conversion of acute CVA and no new infarct. -Patient noted to have been started on aspirin on admission.  Neurology consultation which was done. -Patient seen by stroke team and now recommending transitioning back to Eliquis 2.5 mg twice daily  when patient is more alert and tolerating oral intake. -Patient more alert today and as such we will resume home dose Eliquis 2.5 mg twice daily.  Due to renal function and age per neurology unable to increase dose of Eliquis. -PT/OT/SLP. -Appreciate neurology input and recommendations.  2.  Hypertension -Permissive hypertension secondary to problem #1. -Resume home regimen metoprolol today.  3.  History of A. fib -Patient with permissive hypertension secondary to problem #1.  Rate currently controlled. -Resume home regimen metoprolol for rate control. -Patient more alert today and as such we will resume home regimen Eliquis.Marland Kitchen  4.  Chronic kidney disease stage IV -Stable.  5.  History of diastolic CHF/pulmonary hypertension -Stable. -Patient a little more on the dry side on examination.   -Continue gentle hydration.   6.  History of right atrial thrombus -2D echo done 2 weeks prior to admission with no signs of thrombus noted. -Repeat 2D echo done with EF of 55 to 60%, NWMA, mildly reduced right ventricular systolic function, right ventricular size normal, mildly elevated pulmonary artery systolic pressure, mild to moderate TVR. -No thrombus noted on 2D echo -Antihypertensive medications on hold secondary to permissive hypertension. -Resume home regimen Eliquis as patient more alert.    DVT prophylaxis: Lovenox Code Status: Full Family Communication: Called and updated nephew, Verlin Grills on the telephone.  Disposition:   Status is: Inpatient  The patient will require care spanning > 2 midnights and should be moved to inpatient because: Inpatient level of care appropriate due to severity of illness  Dispo: The patient is from: Home              Anticipated d/c  is to: SNF              Patient currently is not medically stable to d/c.   Difficult to place patient No       Consultants:  Neurology: Dr.Khaliqdina 05/31/2021  Procedures: CT head 06/01/2021, 05/31/2021 MRI  brain 05/31/2021 MRA head 05/31/2021 2D echo 06/01/2021 Carotid Dopplers 06/01/2021   Antimicrobials:  None   Subjective: Patient alert today and oriented to self and place.  Thinks it is 2020.  Knows she is in [place] however unable to state the county nor the state.  Knows Barbette Or is the president.  Some confusion/expressive aphasia.  Moving extremities spontaneously.  Follows simple commands.    Objective: Vitals:   06/01/21 1552 06/01/21 2011 06/02/21 0011 06/02/21 0434  BP: (!) 182/85 (!) 174/81 107/76 (!) 168/96  Pulse: 66 89 96 91  Resp: '14 16 16 16  '$ Temp: (!) 97.3 F (36.3 C) 97.7 F (36.5 C) 98.5 F (36.9 C) 98.4 F (36.9 C)  TempSrc: Axillary Oral Oral Oral  SpO2: 97% 98% 99% 100%  Weight:      Height:        Intake/Output Summary (Last 24 hours) at 06/02/2021 0927 Last data filed at 06/02/2021 0417 Gross per 24 hour  Intake 707.95 ml  Output 830 ml  Net -122.05 ml   Filed Weights   06/01/21 0000  Weight: 49.4 kg    Examination:  General exam: : NAD.  Alert.  Dry mucous membrane Respiratory system: CTA B.  No wheezes, no rhonchi.  Speaking in full sentences.  Normal respiratory effort. Cardiovascular system: Irregularly irregular.  No murmurs rubs or gallops.  No JVD.  No lower extremity edema. Gastrointestinal system: Abdomen soft, nontender, nondistended, positive bowel sounds.  No rebound.  No guarding. Central nervous system: Alert and oriented to self and place.  Somewhat of an expressive aphasia.  Moving extremity spontaneously. Extremities: Symmetric 5 x 5 power. Skin: No rashes, lesions or ulcers Psychiatry: Judgement and insight appear poor to fair. Mood & affect appropriate.   Data Reviewed: I have personally reviewed following labs and imaging studies  CBC: Recent Labs  Lab 05/31/21 1632 06/01/21 0330  WBC 10.0 9.0  NEUTROABS 4.9  --   HGB 12.5 12.6  HCT 40.5 40.1  MCV 89.2 87.6  PLT 239 207     Basic Metabolic Panel: Recent Labs  Lab  05/31/21 1632 06/01/21 0330  NA 138 139  K 4.1 3.8  CL 101 101  CO2 27 27  GLUCOSE 128* 85  BUN 38* 34*  CREATININE 2.18* 2.05*  CALCIUM 9.8 9.7     GFR: Estimated Creatinine Clearance: 13.1 mL/min (A) (by C-G formula based on SCr of 2.05 mg/dL (H)).  Liver Function Tests: Recent Labs  Lab 06/01/21 0330  AST 20  ALT 20  ALKPHOS 73  BILITOT 1.4*  PROT 5.5*  ALBUMIN 3.3*     CBG: Recent Labs  Lab 06/01/21 1544 06/01/21 2008 06/02/21 0008 06/02/21 0431 06/02/21 0810  GLUCAP 84 109* 106* 107* 129*      Recent Results (from the past 240 hour(s))  SARS CORONAVIRUS 2 (TAT 6-24 HRS) Nasopharyngeal Nasopharyngeal Swab     Status: None   Collection Time: 05/31/21  7:27 PM   Specimen: Nasopharyngeal Swab  Result Value Ref Range Status   SARS Coronavirus 2 NEGATIVE NEGATIVE Final    Comment: (NOTE) SARS-CoV-2 target nucleic acids are NOT DETECTED.  The SARS-CoV-2 RNA is generally detectable in upper and lower  respiratory specimens during the acute phase of infection. Negative results do not preclude SARS-CoV-2 infection, do not rule out co-infections with other pathogens, and should not be used as the sole basis for treatment or other patient management decisions. Negative results must be combined with clinical observations, patient history, and epidemiological information. The expected result is Negative.  Fact Sheet for Patients: SugarRoll.be  Fact Sheet for Healthcare Providers: https://www.woods-mathews.com/  This test is not yet approved or cleared by the [place] FDA and  has been authorized for detection and/or diagnosis of SARS-CoV-2 by FDA under an Emergency Use Authorization (EUA). This EUA will remain  in effect (meaning this test can be used) for the duration of the COVID-19 declaration under Se ction 564(b)(1) of the Act, 21 U.S.C. section 360bbb-3(b)(1), unless the authorization is terminated  or revoked sooner.  Performed at [facility], [place] [address] [place], [place] [address]           Radiology Studies: CT HEAD WO CONTRAST (5MM)  Result Date: 06/01/2021 CLINICAL DATA:  Neuro deficit EXAM: CT HEAD WITHOUT CONTRAST TECHNIQUE: Contiguous axial images were obtained from the base of the skull through the vertex without intravenous contrast. COMPARISON:  CT head 05/31/2021 FINDINGS: Brain: There is hypodensity centered in the left caudate body extending towards the lentiform nucleus consistent with evolving subacute infarct as seen on the prior MRI. There is no evidence of hemorrhagic conversion. There is no new infarct. There is no evidence of acute intracranial hemorrhage or extra-axial fluid collection. Mild parenchymal volume loss is unchanged. Remote infarcts in the bilateral basal ganglia are unchanged. There is no mass lesion. There is no midline shift. The ventricles are stable in size. Vascular: No hyperdense vessel or unexpected calcification. Skull: Normal. Negative for fracture or focal lesion. Sinuses/Orbits: The paranasal sinuses are clear. Bilateral lens implants are in place. The globes and orbits are otherwise unremarkable. Other: None. IMPRESSION: 1. Evolving subacute infarct in the left caudate body without evidence of hemorrhagic transformation. 2. No new infarct or hemorrhage. Electronically Signed   By: Valetta Mole M.D.   On: 06/01/2021 14:37   CT HEAD WO CONTRAST  Result Date: 05/31/2021 CLINICAL DATA:  Altered mental status. EXAM: CT HEAD WITHOUT CONTRAST TECHNIQUE: Contiguous axial images were obtained from the base of the skull through the vertex without intravenous contrast. COMPARISON:  None. FINDINGS: Brain: Mild age-related atrophy and chronic microvascular ischemic changes. There is no acute intracranial hemorrhage. No mass effect midline shift. No extra-axial fluid collection. Vascular: No hyperdense vessel or unexpected calcification. Skull:  Normal. Negative for fracture or focal lesion. Sinuses/Orbits: No acute finding. Other: None IMPRESSION: 1. No acute intracranial pathology. 2. Mild age-related atrophy and chronic microvascular ischemic changes. Electronically Signed   By: Anner Crete M.D.   On: 05/31/2021 19:19   MR ANGIO HEAD WO CONTRAST  Result Date: 05/31/2021 CLINICAL DATA:  Acute neurologic deficit EXAM: MRI HEAD WITHOUT CONTRAST MRA HEAD WITHOUT CONTRAST TECHNIQUE: Multiplanar, multi-echo pulse sequences of the brain and surrounding structures were acquired without intravenous contrast. Angiographic images of the Circle of Willis were acquired using MRA technique without intravenous contrast. COMPARISON:  No pertinent prior exam. 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 No acute or chronic hemorrhage. There is multifocal hyperintense T2-weighted signal within the white matter. Generalized volume loss without a clear lobar predilection. The midline structures are normal. Vascular: Major flow voids are preserved. Skull and upper cervical spine: Normal calvarium and  skull base. Visualized upper cervical spine and soft tissues are normal. Sinuses/Orbits:No paranasal sinus fluid levels or advanced mucosal thickening. No mastoid or middle ear effusion. Normal orbits. MRA HEAD FINDINGS POSTERIOR CIRCULATION: --Vertebral arteries: Normal --Inferior cerebellar arteries: Normal. --Basilar artery: Normal. --Superior cerebellar arteries: Normal. --Posterior cerebral arteries: Normal. ANTERIOR CIRCULATION: --Intracranial internal carotid arteries: Normal. --Anterior cerebral arteries (ACA): Normal. --Middle cerebral arteries (MCA): Normal. ANATOMIC VARIANTS: Fetal origin of both posterior cerebral arteries. IMPRESSION: 1. 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 No hemorrhage or mass effect. 2. Normal intracranial MRA. Electronically Signed   By: Ulyses Jarred M.D.   On: 05/31/2021 22:26   MR  BRAIN WO CONTRAST  Result Date: 05/31/2021 CLINICAL DATA:  Acute neurologic deficit EXAM: MRI HEAD WITHOUT CONTRAST MRA HEAD WITHOUT CONTRAST TECHNIQUE: Multiplanar, multi-echo pulse sequences of the brain and surrounding structures were acquired without intravenous contrast. Angiographic images of the Circle of Willis were acquired using MRA technique without intravenous contrast. COMPARISON:  No pertinent prior exam. 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 No acute or chronic hemorrhage. There is multifocal hyperintense T2-weighted signal within the white matter. Generalized volume loss without a clear lobar predilection. The midline structures are normal. Vascular: Major flow voids are preserved. Skull and upper cervical spine: Normal calvarium and skull base. Visualized upper cervical spine and soft tissues are normal. Sinuses/Orbits:No paranasal sinus fluid levels or advanced mucosal thickening. No mastoid or middle ear effusion. Normal orbits. MRA HEAD FINDINGS POSTERIOR CIRCULATION: --Vertebral arteries: Normal --Inferior cerebellar arteries: Normal. --Basilar artery: Normal. --Superior cerebellar arteries: Normal. --Posterior cerebral arteries: Normal. ANTERIOR CIRCULATION: --Intracranial internal carotid arteries: Normal. --Anterior cerebral arteries (ACA): Normal. --Middle cerebral arteries (MCA): Normal. ANATOMIC VARIANTS: Fetal origin of both posterior cerebral arteries. IMPRESSION: 1. 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 No hemorrhage or mass effect. 2. Normal intracranial MRA. Electronically Signed   By: Ulyses Jarred M.D.   On: 05/31/2021 22:26   ECHOCARDIOGRAM COMPLETE  Result Date: 06/01/2021    ECHOCARDIOGRAM REPORT   Patient Name:   CHANTRA WERTHEIMER Date of Exam: 06/01/2021 Medical Rec #:  [id number]        Height:       63.5 in Accession #:    [id number]       Weight:       109.0 lb Date of Birth:  01-26-1925        BSA:          1.503 m  Patient Age:    38 years         BP:           158/87 mmHg Patient Gender: F                HR:           70 bpm. Exam Location:  Inpatient Procedure: 2D Echo, Cardiac Doppler and Color Doppler Indications:    Stroke I63.9  History:        Patient has prior history of Echocardiogram examinations, most                 recent 05/16/2021. Previous Myocardial Infarction; Risk                 Factors:Dyslipidemia and Hypertension.  Sonographer:    Bernadene Person RDCS Referring Phys: Talty  1. Left ventricular ejection fraction, by estimation, is 55 to 60%. The left ventricle has normal function. The left  ventricle has no regional wall motion abnormalities. Left ventricular diastolic parameters are consistent with Grade I diastolic dysfunction (impaired relaxation).  2. Right ventricular systolic function is mildly reduced. The right ventricular size is normal. There is mildly elevated pulmonary artery systolic pressure.  3. The mitral valve is grossly normal. No evidence of mitral valve regurgitation.  4. The aortic valve is tricuspid. Aortic valve regurgitation is trivial. Aortic valve sclerosis is present, with no evidence of aortic valve stenosis.  5. The inferior vena cava is dilated in size with >50% respiratory variability, suggesting right atrial pressure of 8 mmHg.  6. Tricuspid valve regurgitation is mild to moderate. Comparison(s): A prior study was performed on 05/16/21. Prior RVSP 65 mm Hg. FINDINGS  Left Ventricle: Left ventricular ejection fraction, by estimation, is 55 to 60%. The left ventricle has normal function. The left ventricle has no regional wall motion abnormalities. The left ventricular internal cavity size was small. There is no left ventricular hypertrophy. Left ventricular diastolic parameters are consistent with Grade I diastolic dysfunction (impaired relaxation). Right Ventricle: The right ventricular size is normal. No increase in right ventricular wall  thickness. Right ventricular systolic function is mildly reduced. There is mildly elevated pulmonary artery systolic pressure. The tricuspid regurgitant velocity  is 2.78 m/s, and with an assumed right atrial pressure of 8 mmHg, the estimated right ventricular systolic pressure is [id number]. Left Atrium: Left atrial size was normal in size. Right Atrium: Right atrial size was normal in size. Pericardium: Trivial pericardial effusion is present. Mitral Valve: The mitral valve is grossly normal. No evidence of mitral valve regurgitation. Tricuspid Valve: The tricuspid valve is normal in structure. Tricuspid valve regurgitation is mild to moderate. Aortic Valve: The aortic valve is tricuspid. There is mild calcification of the aortic valve. There is mild thickening of the aortic valve. Aortic valve regurgitation is trivial. Aortic regurgitation PHT measures 932 msec. Mild aortic valve sclerosis is present, with no evidence of aortic valve stenosis. Pulmonic Valve: The pulmonic valve was not well visualized. Pulmonic valve regurgitation is mild to moderate. No evidence of pulmonic stenosis. Aorta: The aortic root and ascending aorta are structurally normal, with no evidence of dilitation. Venous: The inferior vena cava is dilated in size with greater than 50% respiratory variability, suggesting right atrial pressure of 8 mmHg. IAS/Shunts: The atrial septum is grossly normal.  LEFT VENTRICLE PLAX 2D LVIDd:         3.10 cm  Diastology LVIDs:         2.20 cm  LV e' medial:    3.09 cm/s LV PW:         1.10 cm  LV E/e' medial:  11.4 LV IVS:        0.90 cm  LV e' lateral:   3.32 cm/s LVOT diam:     2.00 cm  LV E/e' lateral: 10.6 LV SV:         54 LV SV Index:   36 LVOT Area:     3.14 cm  RIGHT VENTRICLE RV S prime:     6.98 cm/s TAPSE (M-mode): 1.3 cm LEFT ATRIUM             Index       RIGHT ATRIUM           Index LA diam:        3.20 cm 2.13 cm/m  RA Area:     11.20 cm LA Vol (A2C):   38.8 ml 25.82 ml/m RA Volume:  25.30 ml  16.84 ml/m LA Vol (A4C):   38.7 ml 25.75 ml/m LA Biplane Vol: 39.5 ml 26.28 ml/m  AORTIC VALVE             PULMONIC VALVE LVOT Vmax:   88.93 cm/s  PR End Diast Vel: 9.61 msec LVOT Vmean:  60.933 cm/s LVOT VTI:    0.171 m AI PHT:      932 msec  AORTA Ao Root diam: 3.10 cm Ao Asc diam:  3.30 cm MITRAL VALVE               TRICUSPID VALVE MV Area (PHT): 3.72 cm    TR Peak grad:   30.9 mmHg MV Decel Time: 204 msec    TR Vmax:        278.00 cm/s MV E velocity: 35.20 cm/s MV A velocity: 84.20 cm/s  SHUNTS MV E/A ratio:  0.42        Systemic VTI:  0.17 m                            Systemic Diam: 2.00 cm Rudean Haskell MD Electronically signed by Rudean Haskell MD Signature Date/Time: 06/01/2021/12:16:58 PM    Final    VAS US CAROTID (at [facility] and [facility] only)  Result Date: 06/01/2021 Carotid Arterial Duplex Study Patient Name:  MESHELL WHEATON  Date of Exam:   06/01/2021 Medical Rec #: [id number]         Accession #:    [id number] Date of Birth: 1925/01/13         Patient Gender: F Patient Age:   89 years Exam Location:  [facility] Procedure:      VAS US CAROTID Referring Phys: Gean Birchwood --------------------------------------------------------------------------------  Indications:  CVA. Risk Factors: Hypertension, hyperlipidemia, coronary artery disease. Performing Technologist: Archie Patten RVS  Examination Guidelines: A complete evaluation includes B-mode imaging, spectral Doppler, color Doppler, and power Doppler as needed of all accessible portions of each vessel. Bilateral testing is considered an integral part of a complete examination. Limited examinations for reoccurring indications may be performed as noted.  Right Carotid Findings: +----------+--------+--------+--------+------------------+--------+           PSV cm/sEDV cm/sStenosisPlaque DescriptionComments +----------+--------+--------+--------+------------------+--------+ CCA Prox  30      8                heterogenous               +----------+--------+--------+--------+------------------+--------+ CCA Distal25      7               heterogenous               +----------+--------+--------+--------+------------------+--------+ ICA Prox  57      20      1-39%   heterogenous               +----------+--------+--------+--------+------------------+--------+ ICA Distal34      11                                         +----------+--------+--------+--------+------------------+--------+ ECA       52      6                                          +----------+--------+--------+--------+------------------+--------+ +----------+--------+-------+--------+-------------------+  PSV cm/sEDV cmsDescribeArm Pressure (mmHG) +----------+--------+-------+--------+-------------------+ [id number]                                         +----------+--------+-------+--------+-------------------+ +---------+--------+--+--------+-+---------+ VertebralPSV cm/s21EDV cm/s5Antegrade +---------+--------+--+--------+-+---------+  Left Carotid Findings: +----------+--------+--------+--------+------------------+--------+           PSV cm/sEDV cm/sStenosisPlaque DescriptionComments +----------+--------+--------+--------+------------------+--------+ CCA Prox  31      7               heterogenous               +----------+--------+--------+--------+------------------+--------+ CCA Distal28      6               heterogenous               +----------+--------+--------+--------+------------------+--------+ ICA Prox  24      10      1-39%   heterogenous               +----------+--------+--------+--------+------------------+--------+ ICA Distal25      6                                          +----------+--------+--------+--------+------------------+--------+ ECA       44                                                  +----------+--------+--------+--------+------------------+--------+ +----------+--------+--------+--------+-------------------+           PSV cm/sEDV cm/sDescribeArm Pressure (mmHG) +----------+--------+--------+--------+-------------------+ [id number]                                          +----------+--------+--------+--------+-------------------+ +---------+--------+--+--------+-+---------+ VertebralPSV cm/s22EDV cm/s5Antegrade +---------+--------+--+--------+-+---------+   Summary: Right Carotid: Velocities in the right ICA are consistent with a 1-39% stenosis. Left Carotid: Velocities in the left ICA are consistent with a 1-39% stenosis. Vertebrals: Bilateral vertebral arteries demonstrate antegrade flow. *See table(s) above for measurements and observations.  Electronically signed by Antony Contras MD on 06/01/2021 at 5:17:25 PM.    Final         Scheduled Meds:   stroke: mapping our early stages of recovery book   Does not apply Once   aspirin  300 mg Rectal Daily   Or   aspirin  325 mg Oral Daily   atorvastatin  40 mg Oral Daily   enoxaparin (LOVENOX) injection  25 mg Subcutaneous Q24H   ezetimibe  10 mg Oral Daily   metoprolol tartrate  100 mg Oral BID   Continuous Infusions:  dextrose 5 % and 0.9% NaCl 75 mL/hr at 06/02/21 [id number]     LOS: 1 day    Time spent: 35 minutes    Irine Seal, MD [facility]   To contact the attending provider between 7A-7P or the covering provider during after hours 7P-7A, please log into the web site [email or web address] and access using universal  password for that web site. If you do not have the password, please call the hospital operator.  06/02/2021, 9:27 AM

## 2021-06-02 NOTE — Evaluation (Addendum)
Clinical/Bedside Swallow Evaluation Patient Details  Name: Heather Saunders MRN: [id number] Date of Birth: [birth year]  Today's Date: 06/02/2021 Time: SLP Start Time (ACUTE ONLY): [id number] SLP Stop Time (ACUTE ONLY): 1300 SLP Time Calculation (min) (ACUTE ONLY): 12 min  Past Medical History:  Past Medical History:  Diagnosis Date   Chronic kidney disease    Coronary disease    NonObstructive   History of nuclear stress test    Nuclear stress test 10/18: EF 96, normal perfusion, low risk   Hyperlipidemia    Hypertension    Myocardial infarct [facility]) 2006   Osteopenia    Osteoporosis    Past Surgical History:  Past Surgical History:  Procedure Laterality Date   LIPOMA EXCISION     removed from Shoulder   TEE WITHOUT CARDIOVERSION N/A 03/20/2021   Procedure: TRANSESOPHAGEAL ECHOCARDIOGRAM (TEE);  Surgeon: Donato Heinz, MD;  Location: [facility] ENDOSCOPY;  Service: Cardiovascular;  Laterality: N/A;   HPI:  87 y.o. female presents to [facility] ED on 05/31/2021 with confusion and slurred speech. MRI demonstrates small acute infarct of the left caudate nucleus. PMH includes atrial fibrillation, pulmonary hypertension, chronic kidney disease stage IV, anemia   Assessment / Plan / Recommendation Clinical Impression  Pleasant pt seen for swallow assessment needing min assist for self feeding set up and attention. Dentition appears mostly intact minus several posterior. Vocal quality is clear,  volitional cough slightly weak and adequate CN II, CN XII unremarkable. Consumption of consecutive straw sips water, applesauce and solid was without significant findings throughout. Therapist recommends chopped meats (Dys 3) due to deconditioning, thin liquids, single pils with thin and set up assist and supervision due to cognitive needs.ST will not follow for swallow. Texture can be advanced by MD/RN. SLP Visit Diagnosis: Dysphagia, unspecified (R13.10)    Aspiration Risk  Mild aspiration risk    Diet  Recommendation Dysphagia 3 (Mech soft);Thin liquid   Liquid Administration via: Cup;Straw Medication Administration: Whole meds with liquid Supervision: Patient able to self feed;Intermittent supervision to cue for compensatory strategies (needs set up) Compensations: Minimize environmental distractions Postural Changes: Seated upright at 90 degrees    Other  Recommendations Oral Care Recommendations: Oral care BID   Follow up Recommendations None      Frequency and Duration            Prognosis        Swallow Study   General Date of Onset: 06/02/21 HPI: 87 y.o. female presents to [facility] ED on 05/31/2021 with confusion and slurred speech. MRI demonstrates small acute infarct of the left caudate nucleus. PMH includes atrial fibrillation, pulmonary hypertension, chronic kidney disease stage IV, anemia Type of Study: Bedside Swallow Evaluation Previous Swallow Assessment:  (no) Diet Prior to this Study: NPO Temperature Spikes Noted: No Respiratory Status: Room air History of Recent Intubation: No Behavior/Cognition: Alert;Cooperative;Pleasant mood;Confused;Distractible;Requires cueing Oral Cavity Assessment: Dry Oral Care Completed by SLP: No Oral Cavity - Dentition: Other (Comment) (possibly missing posterior- difficult to view) Vision: Functional for self-feeding Self-Feeding Abilities: Needs assist;Needs set up Patient Positioning: Upright in chair Baseline Vocal Quality: Normal Volitional Cough: Weak Volitional Swallow: Able to elicit    Oral/Motor/Sensory Function Overall Oral Motor/Sensory Function: Within functional limits   Ice Chips Ice chips: Not tested   Thin Liquid Thin Liquid: Within functional limits    Nectar Thick Nectar Thick Liquid: Not tested   Honey Thick Honey Thick Liquid: Not tested   Puree Puree: Within functional limits   Solid     Solid:  Within functional limits      Houston Siren 06/02/2021,1:35 PM  Orbie Pyo Colvin Caroli.Ed  Risk analyst [phone] Office [phone]

## 2021-06-02 NOTE — Discharge Instructions (Addendum)

## 2021-06-03 DIAGNOSIS — G9340 Encephalopathy, unspecified: Secondary | ICD-10-CM | POA: Diagnosis not present

## 2021-06-03 DIAGNOSIS — I4891 Unspecified atrial fibrillation: Secondary | ICD-10-CM | POA: Diagnosis not present

## 2021-06-03 DIAGNOSIS — I63312 Cerebral infarction due to thrombosis of left middle cerebral artery: Secondary | ICD-10-CM | POA: Diagnosis not present

## 2021-06-03 DIAGNOSIS — I10 Essential (primary) hypertension: Secondary | ICD-10-CM | POA: Diagnosis not present

## 2021-06-03 LAB — BASIC METABOLIC PANEL
Anion gap: 7 (ref 5–15)
BUN: 28 mg/dL — ABNORMAL HIGH (ref 8–23)
CO2: 23 mmol/L (ref 22–32)
Calcium: 9.5 mg/dL (ref 8.9–10.3)
Chloride: 107 mmol/L (ref 98–111)
Creatinine, Ser: 1.91 mg/dL — ABNORMAL HIGH (ref 0.44–1.00)
GFR, Estimated: 24 mL/min — ABNORMAL LOW (ref >60)
Glucose, Bld: 131 mg/dL — ABNORMAL HIGH (ref 70–99)
Potassium: 3.8 mmol/L (ref 3.5–5.1)
Sodium: 137 mmol/L (ref 135–145)

## 2021-06-03 MED ORDER — METOPROLOL TARTRATE 25 MG PO TABS
25.0000 mg | ORAL_TABLET | Freq: Two times a day (BID) | ORAL | Status: DC
  Administered 2021-06-03 – 2021-06-04 (×2): 25 mg via ORAL
  Filled 2021-06-03 (×2): qty 1

## 2021-06-03 MED ORDER — METOPROLOL TARTRATE 12.5 MG HALF TABLET: 12.5000 mg | ORAL_TABLET | Freq: Two times a day (BID) | ORAL | Status: DC

## 2021-06-03 NOTE — TOC Initial Note (Signed)
Transition of Care [facility]) - Initial/Assessment Note    Patient Details  Name: Heather Saunders MRN: [id number] Date of Birth: Jul 18, 1925  Transition of Care [facility]) CM/SW Contact:    Oretha Milch, LCSW Phone Number: 06/03/2021, 9:48 AM  Clinical Narrative:                 CSW met with patient and introduced herself and role to patient. CSW discussed SNF recommendation by physical therapy and provided patient with education on treatment and what that looks like. CSW noted patient declined at this time saying she does not think she wants that. CSW notes patient has [place] aid currently and patient declined additional home health. CSW reflected on PT recs and noted patient reported "she will think about it." TOC to continue to follow at this time.   Expected Discharge Plan: Home/Self Care Barriers to Discharge: Other (must enter comment)   Patient Goals and CMS Choice Patient states their goals for this hospitalization and ongoing recovery are:: "I want to go home. But I'll think about it." CMS Medicare.gov Compare Post Acute Care list provided to:: Patient Choice offered to / list presented to : Patient  Expected Discharge Plan and Services Expected Discharge Plan: Home/Self Care     Post Acute Care Choice: NA                                        Prior Living Arrangements/Services     Patient language and need for interpreter reviewed:: Yes Do you feel safe going back to the place where you live?: Yes      Need for Family Participation in Patient Care: No (Comment) Care giver support system in place?: Yes (comment)   Criminal Activity/Legal Involvement Pertinent to Current Situation/Hospitalization: No - Comment as needed  Activities of Daily Living Home Assistive Devices/Equipment: Walker (specify type) ADL Screening (condition at time of admission) Patient's cognitive ability adequate to safely complete daily activities?: No Is the patient deaf or have difficulty  hearing?: No Does the patient have difficulty seeing, even when wearing glasses/contacts?: Yes Does the patient have difficulty concentrating, remembering, or making decisions?: Yes Patient able to express need for assistance with ADLs?: Yes Does the patient have difficulty dressing or bathing?: Yes Independently performs ADLs?: No Communication: Independent Dressing (OT): Needs assistance Is this a change from baseline?: Pre-admission baseline Grooming: Needs assistance Is this a change from baseline?: Pre-admission baseline Bathing: Needs assistance Is this a change from baseline?: Pre-admission baseline Toileting: Needs assistance Is this a change from baseline?: Pre-admission baseline Does the patient have difficulty walking or climbing stairs?: Yes Weakness of Legs: Both Weakness of Arms/Hands: Both  Permission Sought/Granted Permission sought to share information with : Facility Art therapist granted to share information with : No              Emotional Assessment Appearance:: Appears stated age Attitude/Demeanor/Rapport: Self-Confident Affect (typically observed): Calm Orientation: : Fluctuating Orientation (Suspected and/or reported Sundowners) Alcohol / Substance Use: Not Applicable Psych Involvement: No (comment)  Admission diagnosis:  Aphasia [R47.01] Acute encephalopathy [G93.40] Patient Active Problem List   Diagnosis Date Noted   CVA (cerebral vascular accident) (Heather Saunders) 06/01/2021   CKD (chronic kidney disease), stage IV (Heather Saunders)    Chronic diastolic congestive heart failure (Heather Saunders)    Acute encephalopathy 05/31/2021   Atrial fibrillation (Heather Saunders) 05/31/2021   Acute kidney injury (Heather Saunders)  Pulmonary HTN (Heather Saunders)    Right atrial mass    Acute diastolic CHF (congestive heart failure) (Heather Saunders)    Atrial fibrillation with RVR (Heather Saunders) 03/18/2021   AKI (acute kidney injury) (Heather Saunders) 03/18/2021   Essential hypertension 08/03/2018   Coronary artery disease  involving native coronary artery of native heart without angina pectoris 08/03/2018   Dyspnea on exertion 08/03/2018   Mixed hyperlipidemia 08/03/2018   PCP:  Kathyrn Lass, MD Pharmacy:   [id number] [place] [place]Phone: [phone]: [phone]    Social Determinants of Health (SDOH) Interventions    Readmission Risk Interventions No flowsheet data found.

## 2021-06-03 NOTE — Progress Notes (Signed)
PROGRESS NOTE    Heather Saunders  [id number] DOB: 27-Jan-1925 DOA: 05/31/2021 PCP: Kathyrn Lass, MD    Chief Complaint  Patient presents with   Memory Loss    Brief Narrative:  Patient 87 year old female history of A. fib on Eliquis, pulmonary hypertension, chronic kidney disease stage IV, anemia, presented to the ED with confusion as noted by home health aide with some associated slurred speech.  Work-up in the ED with head CT being unremarkable, MRI head consistent with acute CVA.  Patient admitted for stroke work-up.   Assessment & Plan:   Principal Problem:   CVA (cerebral vascular accident) ([place]) Active Problems:   Essential hypertension   Pulmonary HTN ([place])   Acute encephalopathy   Atrial fibrillation (HCC)   CKD (chronic kidney disease), stage IV (H[facility])   Chronic diastolic congestive heart failure ([place])  #1 acute left caudate nucleus CVA/acute metabolic encephalopathy -Patient presented with confusion and slurred speech. -Head CT done on admission negative for any acute abnormalities. -MRI brain/MRA head done consistent with small acute infarct of the left caudate nucleus, no hemorrhage or mass-effect.  Normal intracranial MRA. -Per neurology patient with left caudate head infarct likely secondary to small vessel disease source in the setting of A. fib and on chronic anticoagulation.  -2D echo done with normal EF, no source of emboli. -Carotid Dopplers done with no significant ICA stenosis. -Patient noted to have significant lethargy on 06/01/2021 after patient noted to have received IV Ativan the evening of 05/31/2021 prior to MRI.  -Patient more alert today, following simple commands. -Repeat head CT done with no hemorrhagic conversion of acute CVA and no new infarct. -Patient noted to have been started on aspirin on admission.  Neurology consultation which was done. -Patient seen by stroke team who recommended resumption of Eliquis 2.5 mg twice daily when patient alert  and tolerating oral intake.   -Eliquis resumed and aspirin 81 mg daily added on top of Eliquis for further stroke prophylaxis per neurology. -PT/OT/SLP. -Needs SNF placement.  TOC consulted. -Appreciate neurology input and recommendations.  2.  Hypertension -Permissive hypertension secondary to problem #1. -Decrease metoprolol to 25 mg twice daily.  -Gradually normalize blood pressure and 2 to 3 days per neurology recommendations.  3.  History of A. fib -Patient with permissive hypertension secondary to problem #1.  Rate currently controlled. -Decrease metoprolol to 25 mg twice daily secondary to problem #1.   -Eliquis resumed for anticoagulation.   4.  Chronic kidney disease stage IV -Stable.  5.  History of diastolic CHF/pulmonary hypertension -Stable. -Decrease Lopressor to 25 mg twice daily.   -IV fluids have been saline locked.   -Continue Zetia, statin, aspirin.  6.  History of right atrial thrombus -2D echo done 2 weeks prior to admission with no signs of thrombus noted. -Repeat 2D echo done with EF of 55 to 60%, NWMA, mildly reduced right ventricular systolic function, right ventricular size normal, mildly elevated pulmonary artery systolic pressure, mild to moderate TVR. -No thrombus noted on 2D echo -Antihypertensive medications on hold secondary to permissive hypertension. -Eliquis resumed.  Metoprolol resumed however will decrease dose to 25 mg twice daily due to problem #1.    DVT prophylaxis: Eliquis Code Status: Full Family Communication: No family at bedside.  Disposition:   Status is: Inpatient  The patient will require care spanning > 2 midnights and should be moved to inpatient because: Inpatient level of care appropriate due to severity of illness  Dispo: The patient is from: Home  Anticipated d/c is to: SNF              Patient currently is not medically stable to d/c.   Difficult to place patient No       Consultants:  Neurology:  Dr.Khaliqdina 05/31/2021  Procedures: CT head 06/01/2021, 05/31/2021 MRI brain 05/31/2021 MRA head 05/31/2021 2D echo 06/01/2021 Carotid Dopplers 06/01/2021   Antimicrobials:  None   Subjective: Sitting up in chair watching television.  Alert to self only.  Some bouts of confusion.  Answers some questions appropriately.  Follows simple commands.  Denies any chest pain.  No shortness of breath.  No abdominal pain.  Asking whether her nephew knows she is in the hospital.  Per RN nephew visited yesterday.    Objective: Vitals:   06/02/21 1926 06/03/21 0345 06/03/21 0851 06/03/21 0907  BP: (!) 163/100 (!) 154/88 (!) 169/105 115/70  Pulse: 91 87 89 90  Resp: '18 16 17 17  '$ Temp: 98.4 F (36.9 C) 97.8 F (36.6 C) 97.6 F (36.4 C) 97.6 F (36.4 C)  TempSrc: Oral Oral Oral Oral  SpO2: 95% 95% 97% 98%  Weight:      Height:        Intake/Output Summary (Last 24 hours) at 06/03/2021 1122 Last data filed at 06/03/2021 0230 Gross per 24 hour  Intake --  Output 200 ml  Net -200 ml    Filed Weights   06/01/21 0000  Weight: 49.4 kg    Examination:  General exam: : NAD Respiratory system: Lungs clear to auscultation bilaterally.  No wheezes, no crackles, no rhonchi.  Normal respiratory effort.   Cardiovascular system: Irregularly irregular.  No JVD.  No lower extremity edema.  Gastrointestinal system: Abdomen soft, nontender, nondistended, positive bowel sounds.  No rebound.  No guarding. Central nervous system: Alert to self only.  Moving extremities spontaneously. Extremities: Symmetric 5 x 5 power. Skin: No rashes, lesions or ulcers Psychiatry: Judgement and insight appear poor.  Mood & affect appropriate.  Data Reviewed: I have personally reviewed following labs and imaging studies  CBC: Recent Labs  Lab 05/31/21 1632 06/01/21 0330  WBC 10.0 9.0  NEUTROABS 4.9  --   HGB 12.5 12.6  HCT 40.5 40.1  MCV 89.2 87.6  PLT 239 207     Basic Metabolic Panel: Recent Labs  Lab  05/31/21 1632 06/01/21 0330 06/02/21 0906 06/03/21 0049  NA 138 139 140 137  K 4.1 3.8 3.6 3.8  CL 101 101 108 107  CO2 '27 27 24 23  '$ GLUCOSE 128* 85 128* 131*  BUN 38* 34* 28* 28*  CREATININE 2.18* 2.05* 1.87* 1.91*  CALCIUM 9.8 9.7 9.3 9.5  MG  --   --  2.1  --      GFR: Estimated Creatinine Clearance: 14 mL/min (A) (by C-G formula based on SCr of 1.91 mg/dL (H)).  Liver Function Tests: Recent Labs  Lab 06/01/21 0330 06/02/21 0906  AST 20 22  ALT 20 17  ALKPHOS 73 67  BILITOT 1.4* 1.7*  PROT 5.5* 5.3*  ALBUMIN 3.3* 3.0*     CBG: Recent Labs  Lab 06/02/21 0431 06/02/21 0810 06/02/21 1145 06/02/21 1624 06/02/21 2000  GLUCAP 107* 129* 121* 228* 180*      Recent Results (from the past 240 hour(s))  SARS CORONAVIRUS 2 (TAT 6-24 HRS) Nasopharyngeal Nasopharyngeal Swab     Status: None   Collection Time: 05/31/21  7:27 PM   Specimen: Nasopharyngeal Swab  Result Value Ref Range Status  SARS Coronavirus 2 NEGATIVE NEGATIVE Final    Comment: (NOTE) SARS-CoV-2 target nucleic acids are NOT DETECTED.  The SARS-CoV-2 RNA is generally detectable in upper and lower respiratory specimens during the acute phase of infection. Negative results do not preclude SARS-CoV-2 infection, do not rule out co-infections with other pathogens, and should not be used as the sole basis for treatment or other patient management decisions. Negative results must be combined with clinical observations, patient history, and epidemiological information. The expected result is Negative.  Fact Sheet for Patients: SugarRoll.be  Fact Sheet for Healthcare Providers: https://www.woods-mathews.com/  This test is not yet approved or cleared by the [place] FDA and  has been authorized for detection and/or diagnosis of SARS-CoV-2 by FDA under an Emergency Use Authorization (EUA). This EUA will remain  in effect (meaning this test can be used)  for the duration of the COVID-19 declaration under Se ction 564(b)(1) of the Act, 21 U.S.C. section 360bbb-3(b)(1), unless the authorization is terminated or revoked sooner.  Performed at [facility], [place] [address] [employer] [employer], [place] [address]   Urine Culture     Status: None   Collection Time: 06/01/21 11:14 AM   Specimen: Urine, Catheterized  Result Value Ref Range Status   Specimen Description URINE, CATHETERIZED  Final   Special Requests NONE  Final   Culture   Final    NO GROWTH Performed at [facility], [address] [place], [place] [address]    Report Status 06/02/2021 FINAL  Final          Radiology Studies: CT HEAD WO CONTRAST (5MM)  Result Date: 06/01/2021 CLINICAL DATA:  Neuro deficit EXAM: CT HEAD WITHOUT CONTRAST TECHNIQUE: Contiguous axial images were obtained from the base of the skull through the vertex without intravenous contrast. COMPARISON:  CT head 05/31/2021 FINDINGS: Brain: There is hypodensity centered in the left caudate body extending towards the lentiform nucleus consistent with evolving subacute infarct as seen on the prior MRI. There is no evidence of hemorrhagic conversion. There is no new infarct. There is no evidence of acute intracranial hemorrhage or extra-axial fluid collection. Mild parenchymal volume loss is unchanged. Remote infarcts in the bilateral basal ganglia are unchanged. There is no mass lesion. There is no midline shift. The ventricles are stable in size. Vascular: No hyperdense vessel or unexpected calcification. Skull: Normal. Negative for fracture or focal lesion. Sinuses/Orbits: The paranasal sinuses are clear. Bilateral lens implants are in place. The globes and orbits are otherwise unremarkable. Other: None. IMPRESSION: 1. Evolving subacute infarct in the left caudate body without evidence of hemorrhagic transformation. 2. No new infarct or hemorrhage. Electronically Signed   By: Valetta Mole M.D.   On: 06/01/2021 14:37         Scheduled Meds:   stroke: mapping our early stages of recovery book   Does not apply Once   apixaban  2.5 mg Oral BID   aspirin EC  81 mg Oral Daily   atorvastatin  40 mg Oral Daily   ezetimibe  10 mg Oral Daily   metoprolol tartrate  100 mg Oral BID   Continuous Infusions:    LOS: 2 days    Time spent: 35 minutes    Irine Seal, MD [facility]   To contact the attending provider between 7A-7P or the covering provider during after hours 7P-7A, please log into the web site [email or web address] and access using universal  password for that web site. If you do not  have the password, please call the hospital operator.  06/03/2021, 11:22 AM

## 2021-06-04 DIAGNOSIS — I63312 Cerebral infarction due to thrombosis of left middle cerebral artery: Secondary | ICD-10-CM | POA: Diagnosis not present

## 2021-06-04 DIAGNOSIS — G9340 Encephalopathy, unspecified: Secondary | ICD-10-CM | POA: Diagnosis not present

## 2021-06-04 DIAGNOSIS — I10 Essential (primary) hypertension: Secondary | ICD-10-CM | POA: Diagnosis not present

## 2021-06-04 DIAGNOSIS — I4891 Unspecified atrial fibrillation: Secondary | ICD-10-CM | POA: Diagnosis not present

## 2021-06-04 LAB — BASIC METABOLIC PANEL
Anion gap: 8 (ref 5–15)
BUN: 34 mg/dL — ABNORMAL HIGH (ref 8–23)
CO2: 26 mmol/L (ref 22–32)
Calcium: 9.2 mg/dL (ref 8.9–10.3)
Chloride: 103 mmol/L (ref 98–111)
Creatinine, Ser: 2.4 mg/dL — ABNORMAL HIGH (ref 0.44–1.00)
GFR, Estimated: 18 mL/min — ABNORMAL LOW (ref >60)
Glucose, Bld: 82 mg/dL (ref 70–99)
Potassium: 4.3 mmol/L (ref 3.5–5.1)
Sodium: 137 mmol/L (ref 135–145)

## 2021-06-04 MED ORDER — METOPROLOL TARTRATE 50 MG PO TABS
50.0000 mg | ORAL_TABLET | Freq: Two times a day (BID) | ORAL | Status: DC
  Administered 2021-06-04: 50 mg via ORAL
  Filled 2021-06-04: qty 1

## 2021-06-04 MED ORDER — METOPROLOL TARTRATE 25 MG PO TABS
25.0000 mg | ORAL_TABLET | Freq: Once | ORAL | Status: AC
  Administered 2021-06-04: 25 mg via ORAL
  Filled 2021-06-04: qty 1

## 2021-06-04 NOTE — TOC Progression Note (Signed)
Transition of Care [facility]) - Progression Note    Patient Details  Name: Heather Saunders MRN: [id number] Date of Birth: 1925/06/14  [facility] CM/SW Contact  Joanne Chars, LCSW Phone Number: 06/04/2021, 1:17 PM  Clinical Narrative:   CSW messaged by RN that pt nephew would like to talk.  CSW came to room but nephew had already left.  Pt confused, unable to participate in conversation regarding SNF.  Choice document left in room.  CSW was able to reach nephew Shanon Brow who reports he is POA.  He is in favor or plan for SNF.  He asked about [facility], [facility] (ALFs?) [facility], [facility].  Pt was at [facility] previously and he does not want her to go back there.  FL2 completed.  [place] Must down--unable to check PASSR.  Pt sent out to [place] and [employer] (nephew is in [place])    Expected Discharge Plan: Home/Self Care Barriers to Discharge: Other (must enter comment)  Expected Discharge Plan and Services Expected Discharge Plan: Home/Self Care     Post Acute Care Choice: NA                                         Social Determinants of Health (SDOH) Interventions    Readmission Risk Interventions No flowsheet data found.

## 2021-06-04 NOTE — Progress Notes (Signed)
PROGRESS NOTE    Heather Saunders  [id number] DOB: 18-Mar-1925 DOA: 05/31/2021 PCP: Kathyrn Lass, MD    Chief Complaint  Patient presents with   Memory Loss    Brief Narrative:  Patient 87 year old female history of A. fib on Eliquis, pulmonary hypertension, chronic kidney disease stage IV, anemia, presented to the ED with confusion as noted by home health aide with some associated slurred speech.  Work-up in the ED with head CT being unremarkable, MRI head consistent with acute CVA.  Patient admitted for stroke work-up.   Assessment & Plan:   Principal Problem:   CVA (cerebral vascular accident) ([place]) Active Problems:   Essential hypertension   Pulmonary HTN ([place])   Acute encephalopathy   Atrial fibrillation (HCC)   CKD (chronic kidney disease), stage IV (HCC)   Chronic diastolic congestive heart failure ([place])  #1 acute left caudate nucleus CVA/acute metabolic encephalopathy -Patient presented with confusion and slurred speech. -Head CT done on admission negative for any acute abnormalities. -MRI brain/MRA head done consistent with small acute infarct of the left caudate nucleus, no hemorrhage or mass-effect.  Normal intracranial MRA. -Per neurology patient with left caudate head infarct likely secondary to small vessel disease source in the setting of A. fib and on chronic anticoagulation.  -2D echo done with normal EF, no source of emboli. -Carotid Dopplers done with no significant ICA stenosis. -Patient noted to have significant lethargy on 06/01/2021 after patient noted to have received IV Ativan the evening of 05/31/2021 prior to MRI.  -Patient more alert today, following simple commands. -Repeat head CT done with no hemorrhagic conversion of acute CVA and no new infarct. -Patient noted to have been started on aspirin on admission.  Neurology consultation which was done. -Patient seen by stroke team who recommended resumption of Eliquis 2.5 mg twice daily and addition of  aspirin 81 mg daily for further stroke prophylaxis per stroke MD. -PT/OT/SLP. -Needs SNF placement.  TOC consulted. -Appreciate neurology input and recommendations.  2.  Hypertension -Permissive hypertension secondary to problem #1. -Increase metoprolol to 50 mg twice daily.  -Continue to hold diuretics -Gradually normalize blood pressure and 2 to 3 days per neurology recommendations.  3.  History of A. fib -Patient with permissive hypertension secondary to problem #1.  Rate currently controlled. -Increase metoprolol to 50 mg twice daily to gradually improve blood pressure.   -Eliquis for anticoagulation.  4.  Chronic kidney disease stage IV -Stable.  5.  History of diastolic CHF/pulmonary hypertension -Stable. -Increase Lopressor to 50 mg twice daily.   -Continue Zetia, statin, aspirin.   -Follow.  6.  History of right atrial thrombus -2D echo done 2 weeks prior to admission with no signs of thrombus noted. -Repeat 2D echo done with EF of 55 to 60%, NWMA, mildly reduced right ventricular systolic function, right ventricular size normal, mildly elevated pulmonary artery systolic pressure, mild to moderate TVR. -No thrombus noted on 2D echo -Antihypertensive medications on hold secondary to permissive hypertension on admission.. -Eliquis resumed.   -Increase metoprolol to 50 mg twice daily.    DVT prophylaxis: Eliquis Code Status: Full Family Communication: No family at bedside.  Disposition:   Status is: Inpatient  The patient will require care spanning > 2 midnights and should be moved to inpatient because: Inpatient level of care appropriate due to severity of illness  Dispo: The patient is from: Home              Anticipated d/c is to: SNF  Patient currently is currently stable medically for discharge.   Difficult to place patient No       Consultants:  Neurology: Dr.Khaliqdina 05/31/2021  Procedures: CT head 06/01/2021, 05/31/2021 MRI brain  05/31/2021 MRA head 05/31/2021 2D echo 06/01/2021 Carotid Dopplers 06/01/2021   Antimicrobials:  None   Subjective: Sitting up in chair.  Alert to self only.  Knows her first name but cannot remember her last name.  Knows she is in [place].  Thinks is 2020.   Objective: Vitals:   06/03/21 1622 06/03/21 1944 06/03/21 2335 06/04/21 0408  BP: (!) 145/100 (!) 162/104 (!) 174/97 (!) 139/96  Pulse: 88 88 89 72  Resp: '18 17 14 16  '$ Temp: 97.9 F (36.6 C) 97.7 F (36.5 C) 98.1 F (36.7 C) 97.9 F (36.6 C)  TempSrc: Oral Oral Axillary Oral  SpO2: 98% 94% 96% 98%  Weight:      Height:       No intake or output data in the 24 hours ending 06/04/21 1222  Filed Weights   06/01/21 0000  Weight: 49.4 kg    Examination:  General exam: : NAD Respiratory system: CTA B.  No wheezes, no crackles, no rhonchi.  Normal respiratory effort. Cardiovascular system: Irregularly irregular.  No JVD.  No lower extremity edema. Gastrointestinal system: Abdomen is soft, nontender, nondistended, positive bowel sounds.  No rebound.  No guarding.  Central nervous system: Alert to self only.  Moving extremities spontaneously. Extremities: Symmetric 5 x 5 power. Skin: No rashes, lesions or ulcers Psychiatry: Judgement and insight appear poor.  Mood & affect appropriate.  Data Reviewed: I have personally reviewed following labs and imaging studies  CBC: Recent Labs  Lab 05/31/21 1632 06/01/21 0330  WBC 10.0 9.0  NEUTROABS 4.9  --   HGB 12.5 12.6  HCT 40.5 40.1  MCV 89.2 87.6  PLT 239 207     Basic Metabolic Panel: Recent Labs  Lab 05/31/21 1632 06/01/21 0330 06/02/21 0906 06/03/21 0049  NA 138 139 140 137  K 4.1 3.8 3.6 3.8  CL 101 101 108 107  CO2 '27 27 24 23  '$ GLUCOSE 128* 85 128* 131*  BUN 38* 34* 28* 28*  CREATININE 2.18* 2.05* 1.87* 1.91*  CALCIUM 9.8 9.7 9.3 9.5  MG  --   --  2.1  --      GFR: Estimated Creatinine Clearance: 14 mL/min (A) (by C-G formula based on SCr of  1.91 mg/dL (H)).  Liver Function Tests: Recent Labs  Lab 06/01/21 0330 06/02/21 0906  AST 20 22  ALT 20 17  ALKPHOS 73 67  BILITOT 1.4* 1.7*  PROT 5.5* 5.3*  ALBUMIN 3.3* 3.0*     CBG: Recent Labs  Lab 06/02/21 0431 06/02/21 0810 06/02/21 1145 06/02/21 1624 06/02/21 2000  GLUCAP 107* 129* 121* 228* 180*      Recent Results (from the past 240 hour(s))  SARS CORONAVIRUS 2 (TAT 6-24 HRS) Nasopharyngeal Nasopharyngeal Swab     Status: None   Collection Time: 05/31/21  7:27 PM   Specimen: Nasopharyngeal Swab  Result Value Ref Range Status   SARS Coronavirus 2 NEGATIVE NEGATIVE Final    Comment: (NOTE) SARS-CoV-2 target nucleic acids are NOT DETECTED.  The SARS-CoV-2 RNA is generally detectable in upper and lower respiratory specimens during the acute phase of infection. Negative results do not preclude SARS-CoV-2 infection, do not rule out co-infections with other pathogens, and should not be used as the sole basis for treatment or other patient management  decisions. Negative results must be combined with clinical observations, patient history, and epidemiological information. The expected result is Negative.  Fact Sheet for Patients: SugarRoll.be  Fact Sheet for Healthcare Providers: https://www.woods-mathews.com/  This test is not yet approved or cleared by the [place] FDA and  has been authorized for detection and/or diagnosis of SARS-CoV-2 by FDA under an Emergency Use Authorization (EUA). This EUA will remain  in effect (meaning this test can be used) for the duration of the COVID-19 declaration under Se ction 564(b)(1) of the Act, 21 U.S.C. section 360bbb-3(b)(1), unless the authorization is terminated or revoked sooner.  Performed at [facility], [place] [address] [place], [place] [address]   Urine Culture     Status: None   Collection Time: 06/01/21 11:14 AM   Specimen: Urine, Catheterized  Result  Value Ref Range Status   Specimen Description URINE, CATHETERIZED  Final   Special Requests NONE  Final   Culture   Final    NO GROWTH Performed at [facility], [address], [place], [place] [address]    Report Status 06/02/2021 FINAL  Final          Radiology Studies: No results found.      Scheduled Meds:   stroke: mapping our early stages of recovery book   Does not apply Once   apixaban  2.5 mg Oral BID   aspirin EC  81 mg Oral Daily   atorvastatin  40 mg Oral Daily   ezetimibe  10 mg Oral Daily   metoprolol tartrate  25 mg Oral BID   Continuous Infusions:    LOS: 3 days    Time spent: 35 minutes    Irine Seal, MD [facility]   To contact the attending provider between 7A-7P or the covering provider during after hours 7P-7A, please log into the web site [email or web address] and access using universal [place] password for that web site. If you do not have the password, please call the hospital operator.  06/04/2021, 12:22 PM

## 2021-06-04 NOTE — NC FL2 (Signed)
[place] LEVEL OF CARE SCREENING TOOL     IDENTIFICATION  Patient Name: Heather Saunders Birthdate: 08/23/1925 Sex: female Admission Date (Current Location): 05/31/2021  [facility] and [place] Number:  Herbalist and Address:  [facility], [place] [address] [place], [place] [address]      Provider Number: [id number]  Attending Physician Name and Address:  Eugenie Filler, MD  Relative Name and Phone Number:  Ancil Linsey [phone]    Current Level of Care: Hospital Recommended Level of Care: [facility] Prior Approval Number:    Date Approved/Denied:   PASRR Number:    Discharge Plan: SNF    Current Diagnoses: Patient Active Problem List   Diagnosis Date Noted   CVA (cerebral vascular accident) ([place]) 06/01/2021   CKD (chronic kidney disease), stage IV ([place])    Chronic diastolic congestive heart failure ([place])    Acute encephalopathy 05/31/2021   Atrial fibrillation ([place]) 05/31/2021   Acute kidney injury ([place])    Pulmonary HTN ([place])    Right atrial mass    Acute diastolic CHF (congestive heart failure) ([place])    Atrial fibrillation with RVR ([place]) 03/18/2021   AKI (acute kidney injury) ([place]) 03/18/2021   Essential hypertension 08/03/2018   Coronary artery disease involving native coronary artery of native heart without angina pectoris 08/03/2018   Dyspnea on exertion 08/03/2018   Mixed hyperlipidemia 08/03/2018    Orientation RESPIRATION BLADDER Height & Weight     Self  Normal Incontinent Weight: 109 lb (49.4 kg) Height:  5' 3.5" (161.3 cm)  BEHAVIORAL SYMPTOMS/MOOD NEUROLOGICAL BOWEL NUTRITION STATUS      Continent Diet (see discharge summary)  AMBULATORY STATUS COMMUNICATION OF NEEDS Skin   Limited Assist Verbally Other (Comment) (ecchymosis)                       Personal Care Assistance Level of Assistance  Bathing, Feeding, Dressing Bathing Assistance: Limited assistance Feeding  assistance: Independent Dressing Assistance: Limited assistance     Functional Limitations Info  Sight, Hearing, Speech Sight Info: Adequate Hearing Info: Adequate Speech Info: Adequate    SPECIAL CARE FACTORS FREQUENCY  PT (By licensed PT), OT (By licensed OT)     PT Frequency: 5x week OT Frequency: 5x week            Contractures Contractures Info: Not present    Additional Factors Info  Code Status, Allergies Code Status Info: full Allergies Info: Boniva (Ibandronic Acid), Fosamax (Alendronate Sodium)           Current Medications (06/04/2021):  This is the current hospital active medication list Current Facility-Administered Medications  Medication Dose Route Frequency Provider Last Rate Last Admin    stroke: mapping our early stages of recovery book   Does not apply Once Rise Patience, MD       acetaminophen (TYLENOL) tablet 650 mg  650 mg Oral Q4H PRN Rise Patience, MD       Or   acetaminophen (TYLENOL) 160 MG/5ML solution 650 mg  650 mg Per Tube Q4H PRN Rise Patience, MD       Or   acetaminophen (TYLENOL) suppository 650 mg  650 mg Rectal Q4H PRN Rise Patience, MD       apixaban Arne [place]) tablet 2.5 mg  2.5 mg Oral BID Eugenie Filler, MD   2.5 mg at 06/04/21 [id number]   aspirin EC tablet 81 mg  81 mg  Oral Daily Rosalin Hawking, MD   81 mg at 06/04/21 0902   atorvastatin (LIPITOR) tablet 40 mg  40 mg Oral Daily Rise Patience, MD   40 mg at 06/04/21 0902   ezetimibe (ZETIA) tablet 10 mg  10 mg Oral Daily Rise Patience, MD   10 mg at 06/04/21 0902   labetalol (NORMODYNE) injection 10 mg  10 mg Intravenous Q2H PRN Rise Patience, MD       metoprolol tartrate (LOPRESSOR) tablet 50 mg  50 mg Oral BID Eugenie Filler, MD       polyethylene glycol (MIRALAX / GLYCOLAX) packet 17 g  17 g Oral Daily PRN Rise Patience, MD   17 g at 06/03/21 1034     Discharge Medications: Please see discharge summary for a list of  discharge medications.  Relevant Imaging Results:  Relevant Lab Results:   Additional Information SS# [id number]. Pt is vaccinated for covid.  Unsure if has had booster.  Joanne Chars, LCSW

## 2021-06-04 NOTE — Progress Notes (Signed)
Physical Therapy Treatment Patient Details Name: Heather Saunders MRN: [id number] DOB: Apr 07, 1925 Today's Date: 06/04/2021    History of Present Illness Pt is a 87 yr old female who presented due to change in cognition. Pt found to have an acute left caudate nucleus CVA.  PMH but to limted to: pulmonary HTN, HTNN, afib, CKD, chronic diastolic congestive heart faluire    PT Comments    Continuing work on functional mobility and activity tolerance;  Much better ability to participate, and we were able to initiate gait and progressive ambulation; I'm still with concerns re: cognition, problem solving, and motor planning -- she still needs post-acute rehab to maximize independence and safety with mobility and ADLs and allow safe dc home;   Noted in most recent TOC note that pt is requesting to go home; This PT put forward to her that in order to go home, she needs to safely be able to get up and walk household distances -- when checking in for pt understanding, she indicated she didn't understand; She remained unable to pull together the need to move and take care of herself better in order to be able to go home -- after re-phrasing, and re-approaching the topic of how she must move and function better in order to go home     Follow Up Recommendations  CIR;Other (comment) (Much improved ability to participate; still with funcitonal deficits that require PT,OT, and ST; pt wanting to get home soon; advanced age lends to medical complexity)  I'm curious for CIR Admissions Coordinator to weigh in     Equipment Recommendations  Rolling walker with 5" wheels;3in1 (PT);Wheelchair (measurements PT);Other (comment) (no need for hospital bed)    Recommendations for Other Services Rehab consult     Precautions / Restrictions Precautions Precautions: Fall Precaution Comments: Quite fatigued with progressive ambulation Restrictions Weight Bearing Restrictions: No    Mobility  Bed Mobility Overal  bed mobility: Needs Assistance Bed Mobility: Supine to Sit     Supine to sit: Min guard;HOB elevated     General bed mobility comments: HOB slightly elevated; Incr time and cues to initiate, but did not need physical assist to come to sit; cues to finish the task by scooting forward to get feet to the floor    Transfers Overall transfer level: Needs assistance Equipment used: Rolling walker (2 wheeled) Transfers: Sit to/from Stand Sit to Stand: Mod assist         General transfer comment: Pt increase in time to motor plan and process; Needs physical assist to keep form leaning too far backward and to power up to fully upright standing  Ambulation/Gait Ambulation/Gait assistance: Min assist;+2 safety/equipment (chair push for safety, and to boost pt confidence to walk further) Gait Distance (Feet): 40 Feet Assistive device: Rolling walker (2 wheeled) Gait Pattern/deviations: Step-through pattern;Decreased step length - right;Decreased step length - left;Trunk flexed     General Gait Details: noting tending to fatigue with incr distance, with trunk getting more and more flexed towards teh end of the walk   Stairs             Wheelchair Mobility    Modified Rankin (Stroke Patients Only) Modified Rankin (Stroke Patients Only) Pre-Morbid Rankin Score: No significant disability Modified Rankin: Moderately severe disability     Balance Overall balance assessment: Needs assistance Sitting-balance support: Feet supported Sitting balance-Leahy Scale: Fair       Standing balance-Leahy Scale: Poor Standing balance comment: REquries at least one UE support with standing  activity                            Cognition Arousal/Alertness: Awake/alert Behavior During Therapy: WFL for tasks assessed/performed;Flat affect Overall Cognitive Status: Impaired/Different from baseline Area of Impairment: Orientation;Attention;Memory;Following  commands;Safety/judgement;Awareness;Problem solving                   Current Attention Level: Sustained Memory: Decreased short-term memory Following Commands: Follows one step commands inconsistently Safety/Judgement: Decreased awareness of safety;Decreased awareness of deficits Awareness: Intellectual Problem Solving: Slow processing;Decreased initiation;Requires verbal cues;Requires tactile cues General Comments: Noted in most recent TOC note that pt is requesting to go home; This PT put forward to her that in order to go home, she needs to safely be able to get up and walk household distances -- when checking in for pt understanding, she indicated she didn't understand; She remained unable to pull together the need to move better to be able to go home after re-phrasing, and re-approaching the topic of how she must move and function better in order to go home      Exercises      General Comments        Pertinent Vitals/Pain Pain Assessment: Faces Faces Pain Scale: No hurt    Home Living                      Prior Function            PT Goals (current goals can now be found in the care plan section) Acute Rehab PT Goals Patient Stated Goal: unable to state PT Goal Formulation: Patient unable to participate in goal setting Time For Goal Achievement: 06/15/21 Potential to Achieve Goals: Good Progress towards PT goals: Progressing toward goals (May need a goal update soon)    Frequency    Min 3X/week      PT Plan Discharge plan needs to be updated;Equipment recommendations need to be updated     Co-evaluation              AM-PAC PT "6 Clicks" Mobility   Outcome Measure  Help needed turning from your back to your side while in a flat bed without using bedrails?: A Little Help needed moving from lying on your back to sitting on the side of a flat bed without using bedrails?: A Little Help needed moving to and from a bed to a chair (including a  wheelchair)?: A Lot Help needed standing up from a chair using your arms (e.g., wheelchair or bedside chair)?: A Lot Help needed to walk in hospital room?: A Lot Help needed climbing 3-5 steps with a railing? : Total 6 Click Score: 13    End of Session Equipment Utilized During Treatment: Gait belt Activity Tolerance: Patient tolerated treatment well Patient left: in chair;with call bell/phone within reach;with chair alarm set Nurse Communication: Mobility status PT Visit Diagnosis: Other abnormalities of gait and mobility (R26.89);Muscle weakness (generalized) (M62.81);Other symptoms and signs involving the nervous system (R29.898)     Time: [id number] PT Time Calculation (min) (ACUTE ONLY): 23 min  Charges:  $Gait Training: 23-37 mins                     Roney Marion, [place]  Acute Rehabilitation Services Pager [phone] Office [phone]    Colletta [place] 06/04/2021, 12:23 PM

## 2021-06-05 DIAGNOSIS — G9340 Encephalopathy, unspecified: Secondary | ICD-10-CM | POA: Diagnosis not present

## 2021-06-05 DIAGNOSIS — I4891 Unspecified atrial fibrillation: Secondary | ICD-10-CM | POA: Diagnosis not present

## 2021-06-05 DIAGNOSIS — I63312 Cerebral infarction due to thrombosis of left middle cerebral artery: Secondary | ICD-10-CM | POA: Diagnosis not present

## 2021-06-05 DIAGNOSIS — I10 Essential (primary) hypertension: Secondary | ICD-10-CM | POA: Diagnosis not present

## 2021-06-05 LAB — BASIC METABOLIC PANEL
Anion gap: 10 (ref 5–15)
BUN: 38 mg/dL — ABNORMAL HIGH (ref 8–23)
CO2: 20 mmol/L — ABNORMAL LOW (ref 22–32)
Calcium: 9.5 mg/dL (ref 8.9–10.3)
Chloride: 106 mmol/L (ref 98–111)
Creatinine, Ser: 2.36 mg/dL — ABNORMAL HIGH (ref 0.44–1.00)
GFR, Estimated: 19 mL/min — ABNORMAL LOW (ref >60)
Glucose, Bld: 138 mg/dL — ABNORMAL HIGH (ref 70–99)
Potassium: 4.5 mmol/L (ref 3.5–5.1)
Sodium: 136 mmol/L (ref 135–145)

## 2021-06-05 MED ORDER — METOPROLOL TARTRATE 50 MG PO TABS
100.0000 mg | ORAL_TABLET | Freq: Two times a day (BID) | ORAL | Status: DC
  Administered 2021-06-05 – 2021-06-13 (×17): 100 mg via ORAL
  Filled 2021-06-05 (×17): qty 2

## 2021-06-05 MED ORDER — DILTIAZEM HCL ER COATED BEADS 120 MG PO CP24
120.0000 mg | ORAL_CAPSULE | Freq: Every day | ORAL | Status: DC
  Administered 2021-06-05 – 2021-06-06 (×2): 120 mg via ORAL
  Filled 2021-06-05 (×2): qty 1

## 2021-06-05 NOTE — Care Management Important Message (Signed)
Important Message  Patient Details  Name: Heather Saunders MRN: [id number] Date of Birth: [birth year]   Medicare Important Message Given:  Yes     Orbie Pyo 06/05/2021, 1:59 PM

## 2021-06-05 NOTE — TOC Progression Note (Signed)
Transition of Care [facility]) - Progression Note    Patient Details  Name: Ewa Kot MRN: [id number] Date of Birth: 01-07-1925  [facility] CM/SW Contact  Emeterio Reeve, [place] Phone Number: 06/05/2021, 2:00 PM  Clinical Narrative:     CSW gave pts nephew Shanon Brow bed choices by phone. Shanon Brow stated he will review offers with brother and call CSW back. Shanon Brow inquired about applying for medicaid. CSW gave number verbally for him to call. Pts passr number is [phone].   Expected Discharge Plan: Home/Self Care Barriers to Discharge: Other (must enter comment)  Expected Discharge Plan and Services Expected Discharge Plan: Home/Self Care     Post Acute Care Choice: NA                                         Social Determinants of Health (SDOH) Interventions    Readmission Risk Interventions No flowsheet data found.  Emeterio Reeve, LCSW Clinical Social Worker

## 2021-06-05 NOTE — Progress Notes (Signed)
PROGRESS NOTE    Heather Saunders  [id number] DOB: [birth year] DOA: 05/31/2021 PCP: Kathyrn Lass, MD    Chief Complaint  Patient presents with   Memory Loss    Brief Narrative:  Patient 87 year old female history of A. fib on Eliquis, pulmonary hypertension, chronic kidney disease stage IV, anemia, presented to the ED with confusion as noted by home health aide with some associated slurred speech.  Work-up in the ED with head CT being unremarkable, MRI head consistent with acute CVA.  Patient admitted for stroke work-up.   Assessment & Plan:   Principal Problem:   CVA (cerebral vascular accident) ([place] ) Active Problems:   Essential hypertension   Pulmonary HTN ([place])   Acute encephalopathy   Atrial fibrillation (HCC)   CKD (chronic kidney disease), stage IV (HCC)   Chronic diastolic congestive heart failure ([place])  1 acute left caudate nucleus CVA/acute metabolic encephalopathy -Patient presented with confusion and slurred speech. -Head CT done on admission negative for any acute abnormalities. -MRI brain/MRA head done consistent with small acute infarct of the left caudate nucleus, no hemorrhage or mass-effect.  Normal intracranial MRA. -Per neurology patient with left caudate head infarct likely secondary to small vessel disease source in the setting of A. fib and on chronic anticoagulation.  -2D echo done with normal EF, no source of emboli. -Carotid Dopplers done with no significant ICA stenosis. -Patient noted to have significant lethargy on 06/01/2021 after patient noted to have received IV Ativan the evening of 05/31/2021 prior to MRI.  -Patient more alert today, following simple commands. -Repeat head CT done with no hemorrhagic conversion of acute CVA and no new infarct. -Patient noted to have been started on aspirin on admission.  Neurology consultation which was done. -Patient seen by stroke team who recommended resumption of Eliquis 2.5 mg twice daily and addition of  aspirin 81 mg daily for further stroke prophylaxis per stroke MD. -PT/OT/SLP. -Needs SNF placement.  TOC consulted. -Stroke team was following but have signed off.   -Outpatient follow-up with neurology.    2.  Hypertension -Permissive hypertension secondary to problem #1. -Increase Lopressor back to home dose of 100 mg twice daily.   -Resume home regimen Cardizem.   -Continue to hold diuretics.   -Follow.    3.  History of A. fib -Resume home regimen Cardizem and increase Lopressor back to home dose 100 mg twice daily for rate control.   -Eliquis for anticoagulation.    4.  Chronic kidney disease stage IV -Stable.  5.  History of diastolic CHF/pulmonary hypertension -Stable. -Increase Lopressor back to home dose of 100 mg twice daily.   -Resume home regimen Cardizem.   -Continue Zetia, statin, aspirin.   -Follow.  6.  History of right atrial thrombus -2D echo done 2 weeks prior to admission with no signs of thrombus noted. -Repeat 2D echo done with EF of 55 to 60%, NWMA, mildly reduced right ventricular systolic function, right ventricular size normal, mildly elevated pulmonary artery systolic pressure, mild to moderate TVR. -No thrombus noted on 2D echo -We will resume home regimen Cardizem.   -Increase metoprolol back to home dose of 100 mg twice daily.   -Continue Eliquis.    DVT prophylaxis: Eliquis Code Status: Full Family Communication: No family at bedside.  Disposition:   Status is: Inpatient  The patient will require care spanning > 2 midnights and should be moved to inpatient because: Inpatient level of care appropriate due to severity of illness  Dispo: The patient  is from: Home              Anticipated d/c is to: SNF versus CIR              Patient currently is currently stable medically for discharge.   Difficult to place patient No       Consultants:  Neurology: Dr.Khaliqdina 05/31/2021  Procedures: CT head 06/01/2021, 05/31/2021 MRI brain  05/31/2021 MRA head 05/31/2021 2D echo 06/01/2021 Carotid Dopplers 06/01/2021   Antimicrobials:  None   Subjective: Sitting up in chair.  Pleasantly confused.  Alert to self only.  Objective: Vitals:   06/04/21 2030 06/04/21 2318 06/05/21 0359 06/05/21 0840  BP: (!) 171/115 (!) 155/111 (!) 196/117 (!) 158/108  Pulse: 98 (!) 101 88 87  Resp: '18 16 16 17  '$ Temp: 98 F (36.7 C) 97.7 F (36.5 C) 97.6 F (36.4 C) (!) 97.5 F (36.4 C)  TempSrc: Oral Oral Oral Oral  SpO2: 97%  95% 99%  Weight:      Height:       No intake or output data in the 24 hours ending 06/05/21 1013  Filed Weights   06/01/21 0000  Weight: 49.4 kg    Examination:  General exam: : NAD Respiratory system: Lungs clear to auscultation bilaterally.  No wheezes, no crackles, no rhonchi.  Normal respiratory effort.  Speaking in full sentences.   Cardiovascular system: Irregularly irregular.  No JVD.  No lower extremity edem Gastrointestinal system: Abdomen soft, nontender, nondistended, positive bowel sounds.  No rebound.  No guarding. Central nervous system: Alert to self only.  Moving extremities spontaneously.  No focal neurological deficits. Extremities: Symmetric 5 x 5 power. Skin: No rashes, lesions or ulcers Psychiatry: Judgement and insight appear poor.  Mood & affect appropriate..  Data Reviewed: I have personally reviewed following labs and imaging studies  CBC: Recent Labs  Lab 05/31/21 1632 06/01/21 0330  WBC 10.0 9.0  NEUTROABS 4.9  --   HGB 12.5 12.6  HCT 40.5 40.1  MCV 89.2 87.6  PLT 239 207     Basic Metabolic Panel: Recent Labs  Lab 06/01/21 0330 06/02/21 0906 06/03/21 0049 06/04/21 1340 06/05/21 0253  NA 139 140 137 137 136  K 3.8 3.6 3.8 4.3 4.5  CL 101 108 107 103 106  CO2 '27 24 23 26 '$ 20*  GLUCOSE 85 128* 131* 82 138*  BUN 34* 28* 28* 34* 38*  CREATININE 2.05* 1.87* 1.91* 2.40* 2.36*  CALCIUM 9.7 9.3 9.5 9.2 9.5  MG  --  2.1  --   --   --      GFR: Estimated  Creatinine Clearance: 11.4 mL/min (A) (by C-G formula based on SCr of 2.36 mg/dL (H)).  Liver Function Tests: Recent Labs  Lab 06/01/21 0330 06/02/21 0906  AST 20 22  ALT 20 17  ALKPHOS 73 67  BILITOT 1.4* 1.7*  PROT 5.5* 5.3*  ALBUMIN 3.3* 3.0*     CBG: Recent Labs  Lab 06/02/21 0431 06/02/21 0810 06/02/21 1145 06/02/21 1624 06/02/21 2000  GLUCAP 107* 129* 121* 228* 180*      Recent Results (from the past 240 hour(s))  SARS CORONAVIRUS 2 (TAT 6-24 HRS) Nasopharyngeal Nasopharyngeal Swab     Status: None   Collection Time: 05/31/21  7:27 PM   Specimen: Nasopharyngeal Swab  Result Value Ref Range Status   SARS Coronavirus 2 NEGATIVE NEGATIVE Final    Comment: (NOTE) SARS-CoV-2 target nucleic acids are NOT DETECTED.  The SARS-CoV-2 RNA  is generally detectable in upper and lower respiratory specimens during the acute phase of infection. Negative results do not preclude SARS-CoV-2 infection, do not rule out co-infections with other pathogens, and should not be used as the sole basis for treatment or other patient management decisions. Negative results must be combined with clinical observations, patient history, and epidemiological information. The expected result is Negative.  Fact Sheet for Patients: SugarRoll.be  Fact Sheet for Healthcare Providers: https://www.woods-mathews.com/  This test is not yet approved or cleared by the [place] FDA and  has been authorized for detection and/or diagnosis of SARS-CoV-2 by FDA under an Emergency Use Authorization (EUA). This EUA will remain  in effect (meaning this test can be used) for the duration of the COVID-19 declaration under Se ction 564(b)(1) of the Act, 21 U.S.C. section 360bbb-3(b)(1), unless the authorization is terminated or revoked sooner.  Performed at [facility], [place] [address], [place], [place] [address]   Urine Culture     Status: None    Collection Time: 06/01/21 11:14 AM   Specimen: Urine, Catheterized  Result Value Ref Range Status   Specimen Description URINE, CATHETERIZED  Final   Special Requests NONE  Final   Culture   Final    NO GROWTH Performed at [facility], [address], [place], [place] [address]    Report Status 06/02/2021 FINAL  Final          Radiology Studies: No results found.      Scheduled Meds:   stroke: mapping our early stages of recovery book   Does not apply Once   apixaban  2.5 mg Oral BID   aspirin EC  81 mg Oral Daily   atorvastatin  40 mg Oral Daily   diltiazem  120 mg Oral Daily   ezetimibe  10 mg Oral Daily   metoprolol tartrate  100 mg Oral BID   Continuous Infusions:    LOS: 4 days    Time spent: 35 minutes    Irine Seal, MD [facility]   To contact the attending provider between 7A-7P or the covering provider during after hours 7P-7A, please log into the web site [email or web address] and access using universal [place] password for that web site. If you do not have the password, please call the hospital operator.  06/05/2021, 10:13 AM

## 2021-06-05 NOTE — Progress Notes (Signed)
Occupational Therapy Treatment Patient Details Name: Heather Saunders MRN: [id number] DOB: 08/09/1925 Today's Date: 06/05/2021    History of present illness Pt is a 87 yr old female who presented due to change in cognition. Pt found to have an acute left caudate nucleus CVA.  PMH but to limted to: pulmonary HTN, HTNN, afib, CKD, chronic diastolic congestive heart faluire   OT comments  Pt in session was reporting they were home at the time and unaware about urine in bed. Pt required increase time with HOB elevated and max times to engage pt into task due to decrease in ability to motor plan. Pt was able to complete static standing with no LOB but required cues with dynamic tasks due to decrease in judgement and sequencing.  Pt currently with functional limitations due to the deficits listed below (see OT Problem List).  Pt will benefit from skilled OT to increase their safety and independence with ADL and functional mobility for ADL to facilitate discharge to venue listed below.    Follow Up Recommendations  CIR;SNF;Supervision/Assistance - 24 hour (if pt is able to continue to progress may benfit from CIR but if unable to go the next option would be SNF)    Equipment Recommendations       Recommendations for Other Services      Precautions / Restrictions Precautions Precautions: Fall Precaution Comments: decrease ability to sequence with walker Restrictions Weight Bearing Restrictions: No       Mobility Bed Mobility Overal bed mobility: Needs Assistance Bed Mobility: Supine to Sit     Supine to sit: Min guard;HOB elevated          Transfers Overall transfer level: Needs assistance Equipment used: Rolling walker (2 wheeled) Transfers: Sit to/from Stand Sit to Stand: Min assist         General transfer comment: Pt takes increase time to sequence through taska and new task of use of walker    Balance Overall balance assessment: Needs assistance Sitting-balance  support: Feet supported Sitting balance-Leahy Scale: Fair     Standing balance support: Bilateral upper extremity supported Standing balance-Leahy Scale: Poor Standing balance comment: REquries at least one UE support with standing activity                           ADL either performed or assessed with clinical judgement   ADL Overall ADL's : Needs assistance/impaired Eating/Feeding: Set up;Cueing for safety;Cueing for sequencing;Sitting   Grooming: Brushing hair;Set up;Cueing for safety;Cueing for sequencing;Sitting   Upper Body Bathing: Cueing for safety;Cueing for sequencing;Sitting   Lower Body Bathing: Cueing for safety;Cueing for sequencing;Sit to/from stand   Upper Body Dressing : Minimal assistance;Cueing for safety;Cueing for sequencing;Sitting   Lower Body Dressing: Moderate assistance;Cueing for safety;Cueing for sequencing   Toilet Transfer: Cueing for safety   Toileting- Clothing Manipulation and Hygiene: Cueing for safety;Cueing for sequencing;Sit to/from stand       Functional mobility during ADLs: Minimal assistance General ADL Comments: Pt requires consistent cues and increase in ability to sequence if provided tactile cues     Vision   Additional Comments: wear glasses at all times   Perception     Praxis      Cognition Arousal/Alertness: Awake/alert Behavior During Therapy: Flat affect Overall Cognitive Status: Impaired/Different from baseline Area of Impairment: Orientation;Attention;Memory;Following commands;Safety/judgement;Awareness;Problem solving                 Orientation Level: Place;Time;Situation Current Attention Level: Sustained  Memory: Decreased short-term memory Following Commands: Follows one step commands inconsistently Safety/Judgement: Decreased awareness of safety;Decreased awareness of deficits Awareness: Intellectual Problem Solving: Slow processing;Decreased initiation;Requires verbal cues;Requires  tactile cues General Comments: Pt when awaking reported she was home and was unable to come up with date        Exercises     Shoulder Instructions       General Comments      Pertinent Vitals/ Pain          Home Living                                          Prior Functioning/Environment              Frequency  Min 2X/week        Progress Toward Goals  OT Goals(current goals can now be found in the care plan section)  Progress towards OT goals: Progressing toward goals  Acute Rehab OT Goals Patient Stated Goal: unable to state OT Goal Formulation: With patient Time For Goal Achievement: 06/16/21 Potential to Achieve Goals: Good ADL Goals Pt Will Perform Upper Body Bathing: with modified independence;sitting Pt Will Perform Lower Body Bathing: with set-up;sit to/from stand Pt Will Transfer to Toilet: with supervision;ambulating;regular height toilet Pt Will Perform Tub/Shower Transfer: with supervision;ambulating;shower seat;rolling walker  Plan Discharge plan remains appropriate    Co-evaluation                 AM-PAC OT "6 Clicks" Daily Activity     Outcome Measure   Help from another person eating meals?: None Help from another person taking care of personal grooming?: A Little Help from another person toileting, which includes using toliet, bedpan, or urinal?: A Lot Help from another person bathing (including washing, rinsing, drying)?: A Lot Help from another person to put on and taking off regular upper body clothing?: A Little Help from another person to put on and taking off regular lower body clothing?: A Lot 6 Click Score: 16    End of Session Equipment Utilized During Treatment: Gait belt;Rolling walker  OT Visit Diagnosis: Unsteadiness on feet (R26.81);Other abnormalities of gait and mobility (R26.89);Muscle weakness (generalized) (M62.81)   Activity Tolerance Patient tolerated treatment well   Patient Left in  chair;with call bell/phone within reach;with chair alarm set   Nurse Communication Mobility status;Other (comment) (sitting OOB)        Time: [id number] OT Time Calculation (min): 25 min  Charges: OT General Charges $OT Visit: 1 Visit OT Treatments $Self Care/Home Management : 23-37 mins  Joeseph Amor OTR/L  Acute Rehab Services  [phone] office number [phone] pager number    Joeseph Amor 06/05/2021, 9:16 AM

## 2021-06-05 NOTE — Progress Notes (Signed)
Inpatient Rehab Admissions Coordinator:   Consult received and chart reviewed.  Note per OT and CSW conversation with pt's nephew, 24/7 supervision is not available.  Given cognitive deficits, would anticipate that she would need 24/7 supervision, at minimum, to safely discharge home from CIR.  [employer] Medicare will not approve SNF stay following CIR.  I would recommend SNF at this time for prolonged stay.  We will sign off at this time.   Shann Medal, PT, DPT Admissions Coordinator [phone] 06/05/21  2:13 PM

## 2021-06-06 DIAGNOSIS — I63312 Cerebral infarction due to thrombosis of left middle cerebral artery: Secondary | ICD-10-CM | POA: Diagnosis not present

## 2021-06-06 LAB — BASIC METABOLIC PANEL
Anion gap: 7 (ref 5–15)
BUN: 42 mg/dL — ABNORMAL HIGH (ref 8–23)
CO2: 24 mmol/L (ref 22–32)
Calcium: 9.5 mg/dL (ref 8.9–10.3)
Chloride: 107 mmol/L (ref 98–111)
Creatinine, Ser: 2.42 mg/dL — ABNORMAL HIGH (ref 0.44–1.00)
GFR, Estimated: 18 mL/min — ABNORMAL LOW (ref >60)
Glucose, Bld: 96 mg/dL (ref 70–99)
Potassium: 4.1 mmol/L (ref 3.5–5.1)
Sodium: 138 mmol/L (ref 135–145)

## 2021-06-06 MED ORDER — ADULT MULTIVITAMIN W/MINERALS CH
1.0000 | ORAL_TABLET | Freq: Every day | ORAL | Status: DC
  Administered 2021-06-06 – 2021-06-14 (×9): 1 via ORAL
  Filled 2021-06-06 (×8): qty 1

## 2021-06-06 MED ORDER — ENSURE ENLIVE PO LIQD
237.0000 mL | Freq: Two times a day (BID) | ORAL | Status: DC
  Administered 2021-06-06 – 2021-06-14 (×14): 237 mL via ORAL

## 2021-06-06 NOTE — TOC Progression Note (Signed)
Transition of Care [facility]) - Progression Note    Patient Details  Name: Heather Saunders MRN: [id number] Date of Birth: October 01, 1924  Transition of Care [facility] CM/SW Contact  Emeterio Reeve, [place] Phone Number: 06/06/2021, 1:09 PM  Clinical Narrative:     CSW received calls from brother of pts nephews concerning bed choice. They requested information to two different facilities [facility] which is closed and [facility] which declined pt. CSW gave one additional SNF offer. They are reviewing and will follow up with CSW.   Expected Discharge Plan: Home/Self Care Barriers to Discharge: Other (must enter comment)  Expected Discharge Plan and Services Expected Discharge Plan: Home/Self Care     Post Acute Care Choice: NA                                         Social Determinants of Health (SDOH) Interventions    Readmission Risk Interventions No flowsheet data found.  Emeterio Reeve, LCSW Clinical Social Worker

## 2021-06-06 NOTE — Progress Notes (Signed)
Initial Nutrition Assessment  DOCUMENTATION CODES:  Severe malnutrition in context of chronic illness  INTERVENTION:  Continue diet order per SLP.  Add Ensure Enlive po BID, each supplement provides 350 kcal and 20 grams of protein.  Add Magic cup TID with meals, each supplement provides 290 kcal and 9 grams of protein.  Add MVI with minerals daily.  NUTRITION DIAGNOSIS:  Severe Malnutrition related to chronic illness (CKD stage 4) as evidenced by severe fat depletion, severe muscle depletion, percent weight loss.  GOAL:  Patient will meet greater than or equal to 90% of their needs  MONITOR:  PO intake, Supplement acceptance, Labs, Weight trends, I & O's  REASON FOR ASSESSMENT:  Malnutrition Screening Tool    ASSESSMENT:  87 yo female with a PMH of A. fib, pulmonary hypertension, chronic kidney disease stage IV, anemia, presented to the ED with confusion as noted by home health aide with some associated slurred speech. Work-up in the ED with head CT being unremarkable, MRI head consistent with acute CVA. Patient admitted for stroke work-up.  Pt is medically stable but is awaiting a safe discharge plan as pt lives alone and not able to have 24-hour care at current residence.  Spoke with pt at bedside. Pt very pleasant, but only oriented to self and not able to provide much information about nutrition history.  Per Epic, pt with very limited meal documentation. Pt ate 100% of breakfast and 75% of lunch yesterday. Only other documented intake was from 9/2 and was recorded as 0%.  Pt is unsure if she has lost weight. Per Epic, pt has lost ~9 lbs (8%) in the last month, which is significant and severe for the time frame.  Recommend adding Magic Cup TID and Ensure BID to promote intake, as well as MVI with minerals daily.  Medications: reviewed  Labs: reviewed, BUN 42 (H), Crt 2.42 (H)  NUTRITION - FOCUSED PHYSICAL EXAM: Flowsheet Row Most Recent Value  Orbital Region  Moderate depletion  Upper Arm Region Severe depletion  Thoracic and Lumbar Region Moderate depletion  Buccal Region Severe depletion  Temple Region Severe depletion  Clavicle Bone Region Severe depletion  Clavicle and Acromion Bone Region Severe depletion  Scapular Bone Region Moderate depletion  Dorsal Hand Severe depletion  Patellar Region Moderate depletion  Anterior Thigh Region Moderate depletion  Posterior Calf Region Moderate depletion  Edema (RD Assessment) None  Hair Reviewed  Eyes Reviewed  Mouth Reviewed  Skin Reviewed  Nails Reviewed   Diet Order:   Diet Order             DIET DYS 3 Room service appropriate? No; Fluid consistency: Thin  Diet effective now                  EDUCATION NEEDS:  Not appropriate for education at this time  Skin:  Skin Assessment: Reviewed RN Assessment (Ecchymosis)  Last BM:  06/05/21 - Type 1, large  Height:  Ht Readings from Last 1 Encounters:  06/01/21 5' 3.5" (1.613 m)   Weight:  Wt Readings from Last 1 Encounters:  06/01/21 49.4 kg   BMI:  Body mass index is 19.01 kg/m.  Estimated Nutritional Needs:  Kcal:  1600-1800 Protein:  65-80 grams Fluid:  >1.6 L  Derrel Nip, RD, LDN (she/her/hers) Registered Dietitian I After-Hours/Weekend Pager # in [place]

## 2021-06-06 NOTE — Progress Notes (Signed)
Physical Therapy Treatment Patient Details Name: Heather Saunders MRN: [id number] DOB: 1925-04-23 Today's Date: 06/06/2021    History of Present Illness Pt is a 87 yr old female who presented due to change in cognition. Pt found to have an acute left caudate nucleus CVA.  PMH but to limted to: pulmonary HTN, HTNN, afib, CKD, chronic diastolic congestive heart faluire    PT Comments    Pt in recliner on arrival, foot of recliner elevated with pt sleeping scooted down and in R sidelying. Pt safely assisted back into seat of recliner before foot of recliner lowered. Pt required mod assist sit to stand, min assist ambulation 10' with RW, and min guard assist sit to supine. Pt very lethargic. Falling asleep quickly after return to bed. PT to continue per POC.   Follow Up Recommendations  CIR     Equipment Recommendations  Rolling walker with 5" wheels;3in1 (PT);Wheelchair (measurements PT)    Recommendations for Other Services       Precautions / Restrictions Precautions Precautions: Fall    Mobility  Bed Mobility Overal bed mobility: Needs Assistance Bed Mobility: Sit to Supine       Sit to supine: Min guard;HOB elevated   General bed mobility comments: assist for safety, +rail, increased time    Transfers Overall transfer level: Needs assistance Equipment used: Rolling walker (2 wheeled) Transfers: Sit to/from Stand Sit to Stand: Mod assist         General transfer comment: assist to power up. Pt very retropulsive with initial stance requiring assist and increased time for anterior translation of weight.  Ambulation/Gait Ambulation/Gait assistance: Min assist Gait Distance (Feet): 10 Feet Assistive device: Rolling walker (2 wheeled) Gait Pattern/deviations: Step-through pattern;Decreased stride length;Trunk flexed Gait velocity: decreased   General Gait Details: assist to maintain balance and manage RW   Stairs             Wheelchair Mobility     Modified Rankin (Stroke Patients Only)       Balance Overall balance assessment: Needs assistance Sitting-balance support: Feet supported;No upper extremity supported Sitting balance-Leahy Scale: Fair   Postural control: Posterior lean Standing balance support: Bilateral upper extremity supported;During functional activity Standing balance-Leahy Scale: Poor Standing balance comment: reliant on external assist                            Cognition Arousal/Alertness: Awake/alert Behavior During Therapy: WFL for tasks assessed/performed Overall Cognitive Status: Impaired/Different from baseline Area of Impairment: Orientation;Attention;Memory;Following commands;Safety/judgement;Awareness;Problem solving                 Orientation Level: Place;Time;Situation Current Attention Level: Sustained Memory: Decreased short-term memory Following Commands: Follows one step commands inconsistently;Follows one step commands with increased time Safety/Judgement: Decreased awareness of safety;Decreased awareness of deficits Awareness: Intellectual Problem Solving: Slow processing;Decreased initiation;Requires verbal cues;Requires tactile cues General Comments: Pleasantly confused      Exercises      General Comments General comments (skin integrity, edema, etc.): VSS on RA      Pertinent Vitals/Pain Pain Assessment: Faces Faces Pain Scale: No hurt    Home Living                      Prior Function            PT Goals (current goals can now be found in the care plan section) Acute Rehab PT Goals Patient Stated Goal: unable to state Progress towards  PT goals: Progressing toward goals    Frequency    Min 3X/week      PT Plan Current plan remains appropriate    Co-evaluation              AM-PAC PT "6 Clicks" Mobility   Outcome Measure  Help needed turning from your back to your side while in a flat bed without using bedrails?: A  Little Help needed moving from lying on your back to sitting on the side of a flat bed without using bedrails?: A Little Help needed moving to and from a bed to a chair (including a wheelchair)?: A Lot Help needed standing up from a chair using your arms (e.g., wheelchair or bedside chair)?: A Lot Help needed to walk in hospital room?: A Little Help needed climbing 3-5 steps with a railing? : Total 6 Click Score: 14    End of Session Equipment Utilized During Treatment: Gait belt Activity Tolerance: Patient tolerated treatment well;Patient limited by lethargy Patient left: in bed;with call bell/phone within reach;with bed alarm set Nurse Communication: Mobility status PT Visit Diagnosis: Other abnormalities of gait and mobility (R26.89);Muscle weakness (generalized) (M62.81);Other symptoms and signs involving the nervous system [id number])     Time: [id number] PT Time Calculation (min) (ACUTE ONLY): 12 min  Charges:  $Gait Training: 8-22 mins                     Lorrin Goodell, PT  Office # [phone] Pager [phone]    [name] 06/06/2021, 12:24 PM

## 2021-06-06 NOTE — Progress Notes (Signed)
  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  Pt able to name 4/6 items on lunch tray; required max cues to shift out of perseveratory responses.  Followed contextual, simple commands with 80% accuracy.  Comprehension improves when topic is introduced and gestural/visual cues are offered, when appropriate, to enhance understanding.  Recommend ongoing SLP f/u while admitted and at next level of care (now SNF given impact of deficits on independence),   HPI HPI: 87 y.o. female presents to [facility] ED on 05/31/2021 with confusion and slurred speech. MRI demonstrates small acute infarct of the left caudate nucleus. PMH includes atrial fibrillation, pulmonary hypertension, chronic kidney disease stage IV, anemia      SLP Plan  Continue with current plan of care       Recommendations                   Oral Care Recommendations: Oral care BID Follow up Recommendations: Skilled Nursing facility SLP Visit Diagnosis: Cognitive communication deficit [id number]) Plan: Continue with current plan of care                      Heather Saunders L. Tivis Ringer, [place] CCC/SLP Acute Rehabilitation Services Office number [phone] Pager [phone]  Heather Saunders 06/06/2021, 11:50 AM

## 2021-06-06 NOTE — Progress Notes (Addendum)
PROGRESS NOTE  Heather Saunders [id number] DOB: October 31, 1924 DOA: 05/31/2021 PCP: Kathyrn Lass, MD  HPI/Recap of past 24 hours:  Patient 87 year old female history of A. fib on Eliquis, pulmonary hypertension, chronic kidney disease stage IV, anemia, presented to the ED with confusion as noted by home health aide with some associated slurred speech.  Work-up in the ED with head CT being unremarkable, MRI head consistent with acute CVA.  Patient admitted for stroke work-up.  06/06/21: Patient was seen and examined.  She is sitting in the chair after eating breakfast.  She is pleasantly confused.  She has no new complaints.    Assessment/Plan: Principal Problem:   CVA (cerebral vascular accident) ([place]) Active Problems:   Essential hypertension   Pulmonary HTN (HCC)   Acute encephalopathy   Atrial fibrillation (HCC)   CKD (chronic kidney disease), stage IV (HCC)   Chronic diastolic congestive heart failure ([place])  1 acute left caudate nucleus CVA/acute metabolic encephalopathy -Patient presented with confusion and slurred speech. -Head CT done on admission negative for any acute abnormalities. -MRI brain/MRA head done consistent with small acute infarct of the left caudate nucleus, no hemorrhage or mass-effect.  Normal intracranial MRA. -Per neurology patient with left caudate head infarct likely secondary to small vessel disease source in the setting of A. fib and on chronic anticoagulation.  -2D echo done with normal EF, no source of emboli. -Carotid Dopplers done with no significant ICA stenosis. -Patient noted to have significant lethargy on 06/01/2021 after patient noted to have received IV Ativan the evening of 05/31/2021 prior to MRI.  -Patient more alert today, following simple commands. -Repeat head CT done with no hemorrhagic conversion of acute CVA and no new infarct. -Patient noted to have been started on aspirin on admission.  Neurology consultation which was done. -Patient seen  by stroke team who recommended resumption of Eliquis 2.5 mg twice daily and addition of aspirin 81 mg daily for further stroke prophylaxis per stroke MD. -PT/OT/SLP. -Needs SNF placement.  TOC consulted. -Stroke team was following but have signed off.   -Outpatient follow-up with neurology.    2.  Hypertension -BP is currently not at goal.  Goal BP normotensive. -Increase Lopressor back to home dose of 100 mg twice daily.   -Resume home regimen Cardizem.   -Continue to hold diuretics.   -Continue to closely monitor and adjust medications accordingly.  3.  History of A. fib -Resume home regimen Cardizem and increase Lopressor back to home dose 100 mg twice daily for rate control.   -Eliquis for anticoagulation.    4.  Chronic kidney disease stage IV -Stable.  5.  History of diastolic CHF/pulmonary hypertension -Stable. -Increase Lopressor back to home dose of 100 mg twice daily.   -Resume home regimen Cardizem.   -Continue Zetia, statin, aspirin.   -Follow.  6.  History of right atrial thrombus -2D echo done 2 weeks prior to admission with no signs of thrombus noted. -Repeat 2D echo done with EF of 55 to 60%, NWMA, mildly reduced right ventricular systolic function, right ventricular size normal, mildly elevated pulmonary artery systolic pressure, mild to moderate TVR. -No thrombus noted on 2D echo -We will resume home regimen Cardizem.   -Increase metoprolol back to home dose of 100 mg twice daily.   -Continue Eliquis.  7.  Generalized weakness/ambulatory dysfunction post stroke OT recommended SNF CSW following for assistance with SNF placement.       DVT prophylaxis: Eliquis Code Status: Full Family Communication: No  family at bedside.  Disposition:    Status is: Inpatient   The patient will require care spanning > 2 midnights and should be moved to inpatient because: Inpatient level of care appropriate due to severity of illness   Dispo: The patient is from: Home               Anticipated d/c is to: SNF versus CIR              Patient currently is currently stable medically for discharge.              Difficult to place patient No           Consultants:  Neurology: Dr.Khaliqdina 05/31/2021   Procedures: CT head 06/01/2021, 05/31/2021 MRI brain 05/31/2021 MRA head 05/31/2021 2D echo 06/01/2021 Carotid Dopplers 06/01/2021     Antimicrobials:  None     Objective: Vitals:   06/05/21 1722 06/05/21 2000 06/06/21 0531 06/06/21 1240  BP: (!) 154/102 (!) 160/78 (!) 166/85 (!) 172/82  Pulse: 81  78 71  Resp: '18 18 18 16  '$ Temp: 98.4 F (36.9 C) 98.3 F (36.8 C) (!) 97.5 F (36.4 C) 97.7 F (36.5 C)  TempSrc: Oral Oral Oral Oral  SpO2: 100% 98% 97% 99%  Weight:      Height:       No intake or output data in the 24 hours ending 06/06/21 1433 Filed Weights   06/01/21 0000  Weight: 49.4 kg    Exam:  General: 87 y.o. year-old female well developed well nourished in no acute distress.  Alert and pleasantly confused. Cardiovascular: Regular rate and rhythm with no rubs or gallops.  No thyromegaly or JVD noted.   Respiratory: Clear to auscultation with no wheezes or rales. Good inspiratory effort. Abdomen: Soft nontender nondistended with normal bowel sounds x4 quadrants. Musculoskeletal: No lower extremity edema. 2/4 pulses in all 4 extremities. Skin: Skin abrasion in right upper extremity, wrapped. Psychiatry: Mood is appropriate for condition and setting   Data Reviewed: CBC: Recent Labs  Lab 05/31/21 1632 06/01/21 0330  WBC 10.0 9.0  NEUTROABS 4.9  --   HGB 12.5 12.6  HCT 40.5 40.1  MCV 89.2 87.6  PLT 239 [id number]   Basic Metabolic Panel: Recent Labs  Lab 06/02/21 0906 06/03/21 0049 06/04/21 1340 06/05/21 0253 06/06/21 0328  NA 140 137 137 136 138  K 3.6 3.8 4.3 4.5 4.1  CL 108 107 103 106 107  CO2 '24 23 26 '$ 20* 24  GLUCOSE 128* 131* 82 138* 96  BUN 28* 28* 34* 38* 42*  CREATININE 1.87* 1.91* 2.40* 2.36* 2.42*  CALCIUM 9.3 9.5  9.2 9.5 9.5  MG 2.1  --   --   --   --    GFR: Estimated Creatinine Clearance: 11.1 mL/min (A) (by C-G formula based on SCr of 2.42 mg/dL (H)). Liver Function Tests: Recent Labs  Lab 06/01/21 0330 06/02/21 0906  AST 20 22  ALT 20 17  ALKPHOS 73 67  BILITOT 1.4* 1.7*  PROT 5.5* 5.3*  ALBUMIN 3.3* 3.0*   No results for input(s): LIPASE, AMYLASE in the last 168 hours. No results for input(s): AMMONIA in the last 168 hours. Coagulation Profile: Recent Labs  Lab 05/31/21 1644  INR 1.2   Cardiac Enzymes: No results for input(s): CKTOTAL, CKMB, CKMBINDEX, TROPONINI in the last 168 hours. BNP (last 3 results) No results for input(s): PROBNP in the last 8760 hours. HbA1C: No results for input(s): HGBA1C in the  last 72 hours. CBG: Recent Labs  Lab 06/02/21 0431 06/02/21 0810 06/02/21 1145 06/02/21 1624 06/02/21 2000  GLUCAP 107* 129* 121* 228* 180*   Lipid Profile: No results for input(s): CHOL, HDL, LDLCALC, TRIG, CHOLHDL, LDLDIRECT in the last 72 hours. Thyroid Function Tests: No results for input(s): TSH, T4TOTAL, FREET4, T3FREE, THYROIDAB in the last 72 hours. Anemia Panel: No results for input(s): VITAMINB12, FOLATE, FERRITIN, TIBC, IRON, RETICCTPCT in the last 72 hours. Urine analysis:    Component Value Date/Time   COLORURINE YELLOW 06/01/2021 [place] 06/01/2021 1114   LABSPEC 1.011 06/01/2021 1114   PHURINE 6.0 06/01/2021 1114   GLUCOSEU NEGATIVE 06/01/2021 1114   HGBUR NEGATIVE 06/01/2021 1114   BILIRUBINUR NEGATIVE 06/01/2021 1114   KETONESUR 5 (A) 06/01/2021 1114   PROTEINUR 100 (A) 06/01/2021 1114   NITRITE NEGATIVE 06/01/2021 1114   LEUKOCYTESUR NEGATIVE 06/01/2021 1114   Sepsis Labs: '@LABRCNTIP'$ (procalcitonin:4,lacticidven:4)  ) Recent Results (from the past 240 hour(s))  SARS CORONAVIRUS 2 (TAT 6-24 HRS) Nasopharyngeal Nasopharyngeal Swab     Status: None   Collection Time: 05/31/21  7:27 PM   Specimen: Nasopharyngeal Swab   Result Value Ref Range Status   SARS Coronavirus 2 NEGATIVE NEGATIVE Final    Comment: (NOTE) SARS-CoV-2 target nucleic acids are NOT DETECTED.  The SARS-CoV-2 RNA is generally detectable in upper and lower respiratory specimens during the acute phase of infection. Negative results do not preclude SARS-CoV-2 infection, do not rule out co-infections with other pathogens, and should not be used as the sole basis for treatment or other patient management decisions. Negative results must be combined with clinical observations, patient history, and epidemiological information. The expected result is Negative.  Fact Sheet for Patients: SugarRoll.be  Fact Sheet for Healthcare Providers: https://www.woods-mathews.com/  This test is not yet approved or cleared by the [place] FDA and  has been authorized for detection and/or diagnosis of SARS-CoV-2 by FDA under an Emergency Use Authorization (EUA). This EUA will remain  in effect (meaning this test can be used) for the duration of the COVID-19 declaration under Se ction 564(b)(1) of the Act, 21 U.S.C. section 360bbb-3(b)(1), unless the authorization is terminated or revoked sooner.  Performed at [facility], [place] [address] [place], [place] [address]   Urine Culture     Status: None   Collection Time: 06/01/21 11:14 AM   Specimen: Urine, Catheterized  Result Value Ref Range Status   Specimen Description URINE, CATHETERIZED  Final   Special Requests NONE  Final   Culture   Final    NO GROWTH Performed at [facility], [address] [place], [place] [address]    Report Status 06/02/2021 FINAL  Final      Studies: No results found.  Scheduled Meds:   stroke: mapping our early stages of recovery book   Does not apply Once   apixaban  2.5 mg Oral BID   aspirin EC  81 mg Oral Daily   atorvastatin  40 mg Oral Daily   diltiazem  120 mg Oral Daily   ezetimibe  10 mg  Oral Daily   feeding supplement  237 mL Oral BID BM   metoprolol tartrate  100 mg Oral BID   multivitamin with minerals  1 tablet Oral Daily    Continuous Infusions:   LOS: 5 days     Kayleen Memos, MD [facility] Pager [phone]  If 7PM-7AM, please contact night-coverage [email or web address] Password [facility] 06/06/2021, 2:33 PM

## 2021-06-07 DIAGNOSIS — I63312 Cerebral infarction due to thrombosis of left middle cerebral artery: Secondary | ICD-10-CM | POA: Diagnosis not present

## 2021-06-07 MED ORDER — DILTIAZEM HCL ER COATED BEADS 180 MG PO CP24
180.0000 mg | ORAL_CAPSULE | Freq: Every day | ORAL | Status: DC
  Administered 2021-06-07 – 2021-06-09 (×3): 180 mg via ORAL
  Filled 2021-06-07 (×4): qty 1

## 2021-06-07 NOTE — Progress Notes (Signed)
PROGRESS NOTE  Ece Hardwicke [id number] DOB: [birth year] DOA: 05/31/2021 PCP: Kathyrn Lass, MD  HPI/Recap of past 24 hours:  Patient 87 year old female history of A. fib on Eliquis, pulmonary hypertension, chronic kidney disease stage IV, anemia, presented to the ED with confusion as noted by home health aide with some associated slurred speech.  Work-up in the ED with head CT being unremarkable, MRI head consistent with acute CVA.  Patient admitted for stroke work-up.  06/07/21: She has no new complaints this morning she denies having a headache or any chest pain or dyspnea.  BP is elevated increased her home dose of Cardizem.    Assessment/Plan: Principal Problem:   CVA (cerebral vascular accident) ([place]) Active Problems:   Essential hypertension   Pulmonary HTN (HCC)   Acute encephalopathy   Atrial fibrillation (HCC)   CKD (chronic kidney disease), stage IV (HCC)   Chronic diastolic congestive heart failure ([place])  1 acute left caudate nucleus CVA/acute metabolic encephalopathy -Patient presented with confusion and slurred speech. -Head CT done on admission negative for any acute abnormalities. -MRI brain/MRA head done consistent with small acute infarct of the left caudate nucleus, no hemorrhage or mass-effect.  Normal intracranial MRA. -Per neurology patient with left caudate head infarct likely secondary to small vessel disease source in the setting of A. fib and on chronic anticoagulation.  -2D echo done with normal EF, no source of emboli. -Carotid Dopplers done with no significant ICA stenosis. -Patient noted to have significant lethargy on 06/01/2021 after patient noted to have received IV Ativan the evening of 05/31/2021 prior to MRI.  -Patient more alert today, following simple commands. -Repeat head CT done with no hemorrhagic conversion of acute CVA and no new infarct. -Patient noted to have been started on aspirin on admission.  Neurology consultation which was  done. -Patient seen by stroke team who recommended resumption of Eliquis 2.5 mg twice daily and addition of aspirin 81 mg daily for further stroke prophylaxis per stroke MD. -PT/OT/SLP. -Needs SNF placement.  TOC consulted. -Stroke team was following but have signed off.   -Outpatient follow-up with neurology.    2.  Hypertension uncontrolled -BP not at goal, elevated Increased her home dose of Cardizem Continue home Lopressor 100 mg twice daily.    -Continue to closely monitor and adjust medications accordingly.  3.  History of A. fib -Resume home regimen Cardizem and increase Lopressor back to home dose 100 mg twice daily for rate control.   -Eliquis for anticoagulation.    4.  Chronic kidney disease stage IV -Stable.  5.  History of diastolic CHF/pulmonary hypertension -Stable. -Increase Lopressor back to home dose of 100 mg twice daily.   -Resume home regimen Cardizem.   -Continue Zetia, statin, aspirin.   -Follow.  6.  History of right atrial thrombus -2D echo done 2 weeks prior to admission with no signs of thrombus noted. -Repeat 2D echo done with EF of 55 to 60%, NWMA, mildly reduced right ventricular systolic function, right ventricular size normal, mildly elevated pulmonary artery systolic pressure, mild to moderate TVR. -No thrombus noted on 2D echo -We will resume home regimen Cardizem.   -Increase metoprolol back to home dose of 100 mg twice daily.   -Continue Eliquis.  Generalized weakness/ambulatory dysfunction OT recommending SNF CSW is working on SNF placement.        DVT prophylaxis: Eliquis Code Status: Full Family Communication: No family at bedside.  Disposition:    Status is: Inpatient   The patient  will require care spanning > 2 midnights and should be moved to inpatient because: Inpatient level of care appropriate due to severity of illness   Dispo: The patient is from: Home              Anticipated d/c is to: SNF versus CIR               Patient currently is currently stable medically for discharge.              Difficult to place patient No           Consultants:  Neurology: Dr.Khaliqdina 05/31/2021   Procedures: CT head 06/01/2021, 05/31/2021 MRI brain 05/31/2021 MRA head 05/31/2021 2D echo 06/01/2021 Carotid Dopplers 06/01/2021     Antimicrobials:  None     Objective: Vitals:   06/07/21 0405 06/07/21 0810 06/07/21 1156 06/07/21 1708  BP: (!) 181/100 (!) 187/95 135/62 (!) 161/101  Pulse: 78 80 82 63  Resp: '18 18 18 19  '$ Temp: 97.8 F (36.6 C) 98.2 F (36.8 C) 98.4 F (36.9 C) 97.7 F (36.5 C)  TempSrc: Axillary Axillary Axillary Oral  SpO2: 96% 98% 98% 97%  Weight:      Height:       No intake or output data in the 24 hours ending 06/07/21 1908 Filed Weights   06/01/21 0000  Weight: 49.4 kg    Exam:  General: 87 y.o. year-old female well-developed well-nourished in no acute distress.  She is pleasantly confused.   Cardiovascular: Regular rate and rhythm no rubs or gallops.  No thyromegaly or JVD noted.   Respiratory: Clear to auscultation no wheezes or rales.   Abdomen: Soft nontender normal bowel sounds present.   Musculoskeletal: No lower extremity edema bilaterally. Skin: Skin abrasion in right upper extremity, wrapped. Psychiatry: Mood is appropriate for condition and setting.   Data Reviewed: CBC: Recent Labs  Lab 06/01/21 0330  WBC 9.0  HGB 12.6  HCT 40.1  MCV 87.6  PLT [id number]   Basic Metabolic Panel: Recent Labs  Lab 06/02/21 0906 06/03/21 0049 06/04/21 1340 06/05/21 0253 06/06/21 0328  NA 140 137 137 136 138  K 3.6 3.8 4.3 4.5 4.1  CL 108 107 103 106 107  CO2 '24 23 26 '$ 20* 24  GLUCOSE 128* 131* 82 138* 96  BUN 28* 28* 34* 38* 42*  CREATININE 1.87* 1.91* 2.40* 2.36* 2.42*  CALCIUM 9.3 9.5 9.2 9.5 9.5  MG 2.1  --   --   --   --    GFR: Estimated Creatinine Clearance: 11.1 mL/min (A) (by C-G formula based on SCr of 2.42 mg/dL (H)). Liver Function Tests: Recent Labs  Lab  06/01/21 0330 06/02/21 0906  AST 20 22  ALT 20 17  ALKPHOS 73 67  BILITOT 1.4* 1.7*  PROT 5.5* 5.3*  ALBUMIN 3.3* 3.0*   No results for input(s): LIPASE, AMYLASE in the last 168 hours. No results for input(s): AMMONIA in the last 168 hours. Coagulation Profile: No results for input(s): INR, PROTIME in the last 168 hours.  Cardiac Enzymes: No results for input(s): CKTOTAL, CKMB, CKMBINDEX, TROPONINI in the last 168 hours. BNP (last 3 results) No results for input(s): PROBNP in the last 8760 hours. HbA1C: No results for input(s): HGBA1C in the last 72 hours. CBG: Recent Labs  Lab 06/02/21 0431 06/02/21 0810 06/02/21 1145 06/02/21 1624 06/02/21 2000  GLUCAP 107* 129* 121* 228* 180*   Lipid Profile: No results for input(s): CHOL, HDL, LDLCALC, TRIG, CHOLHDL,  LDLDIRECT in the last 72 hours. Thyroid Function Tests: No results for input(s): TSH, T4TOTAL, FREET4, T3FREE, THYROIDAB in the last 72 hours. Anemia Panel: No results for input(s): VITAMINB12, FOLATE, FERRITIN, TIBC, IRON, RETICCTPCT in the last 72 hours. Urine analysis:    Component Value Date/Time   COLORURINE YELLOW 06/01/2021 [place] 06/01/2021 1114   LABSPEC 1.011 06/01/2021 1114   PHURINE 6.0 06/01/2021 1114   GLUCOSEU NEGATIVE 06/01/2021 1114   HGBUR NEGATIVE 06/01/2021 1114   BILIRUBINUR NEGATIVE 06/01/2021 1114   KETONESUR 5 (A) 06/01/2021 1114   PROTEINUR 100 (A) 06/01/2021 1114   NITRITE NEGATIVE 06/01/2021 1114   LEUKOCYTESUR NEGATIVE 06/01/2021 1114   Sepsis Labs: '@LABRCNTIP'$ (procalcitonin:4,lacticidven:4)  ) Recent Results (from the past 240 hour(s))  SARS CORONAVIRUS 2 (TAT 6-24 HRS) Nasopharyngeal Nasopharyngeal Swab     Status: None   Collection Time: 05/31/21  7:27 PM   Specimen: Nasopharyngeal Swab  Result Value Ref Range Status   SARS Coronavirus 2 NEGATIVE NEGATIVE Final    Comment: (NOTE) SARS-CoV-2 target nucleic acids are NOT DETECTED.  The SARS-CoV-2 RNA is  generally detectable in upper and lower respiratory specimens during the acute phase of infection. Negative results do not preclude SARS-CoV-2 infection, do not rule out co-infections with other pathogens, and should not be used as the sole basis for treatment or other patient management decisions. Negative results must be combined with clinical observations, patient history, and epidemiological information. The expected result is Negative.  Fact Sheet for Patients: SugarRoll.be  Fact Sheet for Healthcare Providers: https://www.woods-mathews.com/  This test is not yet approved or cleared by the [place] FDA and  has been authorized for detection and/or diagnosis of SARS-CoV-2 by FDA under an Emergency Use Authorization (EUA). This EUA will remain  in effect (meaning this test can be used) for the duration of the COVID-19 declaration under Se ction 564(b)(1) of the Act, 21 U.S.C. section 360bbb-3(b)(1), unless the authorization is terminated or revoked sooner.  Performed at [facility], [place] [address] [place], [place] [address]   Urine Culture     Status: None   Collection Time: 06/01/21 11:14 AM   Specimen: Urine, Catheterized  Result Value Ref Range Status   Specimen Description URINE, CATHETERIZED  Final   Special Requests NONE  Final   Culture   Final    NO GROWTH Performed at [facility], [address] [place], [place] [address]    Report Status 06/02/2021 FINAL  Final      Studies: No results found.  Scheduled Meds:   stroke: mapping our early stages of recovery book   Does not apply Once   apixaban  2.5 mg Oral BID   aspirin EC  81 mg Oral Daily   atorvastatin  40 mg Oral Daily   diltiazem  180 mg Oral Daily   ezetimibe  10 mg Oral Daily   feeding supplement  237 mL Oral BID BM   metoprolol tartrate  100 mg Oral BID   multivitamin with minerals  1 tablet Oral Daily    Continuous Infusions:    LOS: 6 days     Kayleen Memos, MD [facility] Pager [phone]  If 7PM-7AM, please contact night-coverage [email or web address] Password [facility] 06/07/2021, 7:08 PM

## 2021-06-07 NOTE — TOC Progression Note (Addendum)
Transition of Care [facility]) - Progression Note    Patient Details  Name: Savon Giuffrida MRN: [id number] Date of Birth: 1924/12/09  [facility] CM/SW Contact  Emeterio Reeve, [place] Phone Number: 06/07/2021, 10:20 AM  Clinical Narrative:     CSW spoke to pts nephew Shanon Brow about bed choice. They still have not made a decision of which SNF they want her to go. CSW explained that it is imperative that they pick a bed today so insurance auth can be started.   1:15pm Pts nephew Waunita Schooner chose [facility]. CSW confirmed they can accept pt at DC. CSW started insurance auth. CSW will request covid test.   Expected Discharge Plan: Home/Self Care Barriers to Discharge: Other (must enter comment)  Expected Discharge Plan and Services Expected Discharge Plan: Home/Self Care     Post Acute Care Choice: NA                                         Social Determinants of Health (SDOH) Interventions    Readmission Risk Interventions No flowsheet data found.  Emeterio Reeve, LCSW Clinical Social Worker

## 2021-06-07 NOTE — Progress Notes (Signed)
Occupational Therapy Treatment Patient Details Name: Heather Saunders MRN: [id number] DOB: 01-16-1925 Today's Date: 06/07/2021    History of present illness Pt is a 87 yr old female who presented due to change in cognition. Pt found to have an acute left caudate nucleus CVA.  PMH but to limted to: pulmonary HTN, HTNN, afib, CKD, chronic diastolic congestive heart faluire   OT comments  Patient met lying supine in bed in agreement with OT treatment session with focus on self-care re-education, functional transfers, and cognitive reorientation. Patient progressed from supine to EOB with Min A at BLE and from EOB to [facility] with use of RW, Min A and cues for sequencing/hand placement. Patient completed 3/3 toileting tasks with Mod A and max multimodal cues for sequencing/safety. Noted malodorous urine. RN made aware.  Session concluded with patient seated in recliner with call bell within reach, chair alarm activated and all needs met. Patient continues to be limited by deficits listed below. OT will continue to follow acutely.    Follow Up Recommendations  SNF;Supervision/Assistance - 24 hour    Equipment Recommendations  Other (comment) (Defer to next level of care.)    Recommendations for Other Services      Precautions / Restrictions Precautions Precautions: Fall Precaution Comments: High fall risk Restrictions Weight Bearing Restrictions: No       Mobility Bed Mobility Overal bed mobility: Needs Assistance Bed Mobility: Sit to Supine     Supine to sit: Min assist;HOB elevated     General bed mobility comments: Patient initially able to advance BLE toward EOB but requried Min A for completion of task. Able to elevate trunk with use of bed rail.    Transfers Overall transfer level: Needs assistance Equipment used: Rolling walker (2 wheeled) Transfers: Sit to/from Stand Sit to Stand: Mod assist         General transfer comment: Assist to power up. Pt very retropulsive with  initial stance requiring assist and increased time for anterior translation of weight.    Balance Overall balance assessment: Needs assistance Sitting-balance support: Feet supported;No upper extremity supported Sitting balance-Leahy Scale: Fair Sitting balance - Comments: Maintains static sitting balance at EOB with close supervision A for safety.   Standing balance support: Bilateral upper extremity supported;During functional activity Standing balance-Leahy Scale: Poor Standing balance comment: Reliant on BUE on RW and external assist to maintain static standing balance during ADLs.                           ADL either performed or assessed with clinical judgement   ADL Overall ADL's : Needs assistance/impaired     Grooming: Supervision/safety;Cueing for sequencing Grooming Details (indicate cue type and reason): Cues for attention to task and for sequencing.             Lower Body Dressing: Cueing for safety;Cueing for sequencing;Maximal assistance Lower Body Dressing Details (indicate cue type and reason): Max A to doff soiled mesh panties, thread BLE through new pair and hike over hips in standing. Toilet Transfer: Minimal assistance;RW;Cueing for sequencing Toilet Transfer Details (indicate cue type and reason): Min A with use of RW. Toileting- Clothing Manipulation and Hygiene: Moderate assistance;Sit to/from stand Toileting - Clothing Manipulation Details (indicate cue type and reason): Mod A for toileting/hygiene/clothing management seated on BSC.             Vision       Perception     Praxis  Cognition Arousal/Alertness: Awake/alert Behavior During Therapy: WFL for tasks assessed/performed Overall Cognitive Status: Impaired/Different from baseline Area of Impairment: Orientation;Attention;Memory;Following commands;Safety/judgement;Awareness;Problem solving                 Orientation Level: Place;Time;Situation Current Attention  Level: Sustained Memory: Decreased short-term memory Following Commands: Follows one step commands inconsistently;Follows one step commands with increased time Safety/Judgement: Decreased awareness of safety;Decreased awareness of deficits Awareness: Intellectual Problem Solving: Slow processing;Decreased initiation;Requires verbal cues;Requires tactile cues General Comments: Pleasantly confused; unintelligible speech; requires frequent redirection; max multimodal cues to sequence familiar ADLs.        Exercises     Shoulder Instructions       General Comments Malodorous urine. RN made aware.    Pertinent Vitals/ Pain       Pain Assessment: No/denies pain  Home Living                                          Prior Functioning/Environment              Frequency  Min 2X/week        Progress Toward Goals  OT Goals(current goals can now be found in the care plan section)     Acute Rehab OT Goals Patient Stated Goal: No goals stated. OT Goal Formulation: With patient Time For Goal Achievement: 06/16/21 Potential to Achieve Goals: Good ADL Goals Pt Will Perform Upper Body Bathing: with modified independence;sitting Pt Will Perform Lower Body Bathing: with set-up;sit to/from stand Pt Will Transfer to Toilet: with supervision;ambulating;regular height toilet Pt Will Perform Tub/Shower Transfer: with supervision;ambulating;shower seat;rolling walker  Plan Discharge plan remains appropriate;Discharge plan needs to be updated;Frequency remains appropriate    Co-evaluation                 AM-PAC OT "6 Clicks" Daily Activity     Outcome Measure   Help from another person eating meals?: None Help from another person taking care of personal grooming?: A Little Help from another person toileting, which includes using toliet, bedpan, or urinal?: A Lot Help from another person bathing (including washing, rinsing, drying)?: A Lot Help from another  person to put on and taking off regular upper body clothing?: A Little Help from another person to put on and taking off regular lower body clothing?: A Lot 6 Click Score: 16    End of Session Equipment Utilized During Treatment: Gait belt;Rolling walker  OT Visit Diagnosis: Unsteadiness on feet (R26.81);Other abnormalities of gait and mobility (R26.89);Muscle weakness (generalized) (M62.81)   Activity Tolerance Patient tolerated treatment well   Patient Left in chair;with call bell/phone within reach;with chair alarm set   Nurse Communication Mobility status;Other (comment) (Malodorous urine)        Time: 0109-3235 OT Time Calculation (min): 20 min  Charges: OT General Charges $OT Visit: 1 Visit OT Treatments $Self Care/Home Management : 8-22 mins  Ryah Cribb H. OTR/L Supplemental OT, Department of rehab services [phone]   Kelcey Wickstrom R H. 06/07/2021, 1:08 PM

## 2021-06-07 NOTE — Progress Notes (Signed)
Pt's Don Broach, wants to be alerted prior to when the patient is being transported [phone]

## 2021-06-08 DIAGNOSIS — I63312 Cerebral infarction due to thrombosis of left middle cerebral artery: Secondary | ICD-10-CM | POA: Diagnosis not present

## 2021-06-08 DIAGNOSIS — E43 Unspecified severe protein-calorie malnutrition: Secondary | ICD-10-CM | POA: Insufficient documentation

## 2021-06-08 LAB — SARS CORONAVIRUS 2 (TAT 6-24 HRS): SARS Coronavirus 2: NEGATIVE

## 2021-06-08 MED ORDER — ENSURE ENLIVE PO LIQD: 237.0000 mL | Freq: Two times a day (BID) | ORAL | 0 refills | Status: DC

## 2021-06-08 MED ORDER — HYDRALAZINE HCL 10 MG PO TABS: 10.0000 mg | ORAL_TABLET | Freq: Two times a day (BID) | ORAL | 0 refills | Status: DC

## 2021-06-08 MED ORDER — HYDRALAZINE HCL 10 MG PO TABS
10.0000 mg | ORAL_TABLET | Freq: Three times a day (TID) | ORAL | Status: DC
  Administered 2021-06-08 – 2021-06-10 (×7): 10 mg via ORAL
  Filled 2021-06-08 (×7): qty 1

## 2021-06-08 MED ORDER — DILTIAZEM HCL ER COATED BEADS 180 MG PO CP24: 180.0000 mg | ORAL_CAPSULE | Freq: Every day | ORAL | 0 refills | Status: DC

## 2021-06-08 MED ORDER — ASPIRIN 81 MG PO TBEC: 81.0000 mg | DELAYED_RELEASE_TABLET | Freq: Every day | ORAL | 0 refills | Status: DC

## 2021-06-08 NOTE — Progress Notes (Signed)
PROGRESS NOTE  Heather Saunders [id number] DOB: 01-26-1925 DOA: 05/31/2021 PCP: Kathyrn Lass, MD  HPI/Recap of past 24 hours:  Patient 87 year old female history of A. fib on Eliquis, pulmonary hypertension, chronic kidney disease stage IV, anemia, presented to the ED with confusion as noted by home health aide with some associated slurred speech.  Work-up in the ED with head CT being unremarkable, MRI head consistent with acute CVA.  Patient admitted for stroke work-up.  Hospital course complicated by generalized weakness for which PT OT recommended SNF placement.  Uncontrolled hypertension, home Cardizem dose was increased and she was started on p.o. hydralazine 10 mg 3 times daily.  06/08/21: She was seen and examined at her bedside.  She has no new complaint.  She is pleasantly confused.  Updated her nephew via phone.    Assessment/Plan: Principal Problem:   CVA (cerebral vascular accident) ([place]) Active Problems:   Essential hypertension   Pulmonary HTN (HCC)   Acute encephalopathy   Atrial fibrillation (HCC)   CKD (chronic kidney disease), stage IV (HCC)   Chronic diastolic congestive heart failure (HCC)   Protein-calorie malnutrition, severe  1 acute left caudate nucleus CVA/acute metabolic encephalopathy -Patient presented with confusion and slurred speech. -Head CT done on admission negative for any acute abnormalities. -MRI brain/MRA head done consistent with small acute infarct of the left caudate nucleus, no hemorrhage or mass-effect.  Normal intracranial MRA. -Per neurology patient with left caudate head infarct likely secondary to small vessel disease source in the setting of A. fib and on chronic anticoagulation.  -2D echo done with normal EF, no source of emboli. -Carotid Dopplers done with no significant ICA stenosis. -Patient noted to have significant lethargy on 06/01/2021 after patient noted to have received IV Ativan the evening of 05/31/2021 prior to MRI.  -Patient  more alert today, following simple commands. -Repeat head CT done with no hemorrhagic conversion of acute CVA and no new infarct. -Patient noted to have been started on aspirin on admission.  Neurology consultation which was done. -Patient seen by stroke team who recommended resumption of Eliquis 2.5 mg twice daily and addition of aspirin 81 mg daily for further stroke prophylaxis per stroke MD. -PT/OT/SLP. -Needs SNF placement.  TOC consulted. -Stroke team was following but have signed off.   -Outpatient follow-up with neurology.    2.  Hypertension uncontrolled -BP not at goal, elevated Increased her home dose of Cardizem to 180 mg daily Continue home Lopressor 100 mg twice daily.    Added p.o. hydralazine 10 mg 3 times daily -Continue to closely monitor and adjust medications accordingly.  3.  History of A. fib -Resume home regimen Cardizem and increase Lopressor back to home dose 100 mg twice daily for rate control.   -Eliquis for anticoagulation.    4.  Chronic kidney disease stage IV -Stable.  5.  History of diastolic CHF/pulmonary hypertension -Stable. -Increase Lopressor back to home dose of 100 mg twice daily.   -Resume home regimen Cardizem.   -Continue Zetia, statin, aspirin.   -Follow.  6.  History of right atrial thrombus -2D echo done 2 weeks prior to admission with no signs of thrombus noted. -Repeat 2D echo done with EF of 55 to 60%, NWMA, mildly reduced right ventricular systolic function, right ventricular size normal, mildly elevated pulmonary artery systolic pressure, mild to moderate TVR. -No thrombus noted on 2D echo -We will resume home regimen Cardizem.   -Increase metoprolol back to home dose of 100 mg twice daily.   -  Continue Eliquis.  Generalized weakness/ambulatory dysfunction OT recommending SNF CSW is working on SNF placement. Awaiting insurance authorization.        DVT prophylaxis: Eliquis Code Status: Full Family Communication: No  family at bedside.  Disposition:    Status is: Inpatient   The patient will require care spanning > 2 midnights and should be moved to inpatient because: Inpatient level of care appropriate due to severity of illness   Dispo: The patient is from: Home              Anticipated d/c is to: SNF versus CIR              Patient currently is currently stable medically for discharge.              Difficult to place patient No           Consultants:  Neurology: Dr.Khaliqdina 05/31/2021   Procedures: CT head 06/01/2021, 05/31/2021 MRI brain 05/31/2021 MRA head 05/31/2021 2D echo 06/01/2021 Carotid Dopplers 06/01/2021     Antimicrobials:  None     Objective: Vitals:   06/08/21 0050 06/08/21 0542 06/08/21 1236 06/08/21 1817  BP: (!) 179/100 (!) 182/98 (!) 159/89 (!) 158/76  Pulse: 66 73 67 74  Resp: '19 18 16 16  '$ Temp: 97.7 F (36.5 C) 98.4 F (36.9 C) 97.6 F (36.4 C) 98.4 F (36.9 C)  TempSrc: Oral Oral Oral Oral  SpO2: 98% 98% 99% 96%  Weight:      Height:       No intake or output data in the 24 hours ending 06/08/21 1819 Filed Weights   06/01/21 0000  Weight: 49.4 kg    Exam:  General: 87 y.o. year-old female well-developed well-nourished in no acute distress.  She is pleasantly confused. Cardiovascular: Regular rate and rhythm no rubs or gallops.   Respiratory: Clear to auscultation with no wheezes or rales. Abdomen: Soft nontender Normal Bowel Sounds Present.   Musculoskeletal: No lower extremity edema bilaterally. Skin: Skin abrasion in right upper extremity, wrapped. Psychiatry: Mood is appropriate for condition and setting.   Data Reviewed: CBC: No results for input(s): WBC, NEUTROABS, HGB, HCT, MCV, PLT in the last 168 hours.  Basic Metabolic Panel: Recent Labs  Lab 06/02/21 0906 06/03/21 0049 06/04/21 1340 06/05/21 0253 06/06/21 0328  NA 140 137 137 136 138  K 3.6 3.8 4.3 4.5 4.1  CL 108 107 103 106 107  CO2 '24 23 26 '$ 20* 24  GLUCOSE 128* 131* 82 138*  96  BUN 28* 28* 34* 38* 42*  CREATININE 1.87* 1.91* 2.40* 2.36* 2.42*  CALCIUM 9.3 9.5 9.2 9.5 9.5  MG 2.1  --   --   --   --    GFR: Estimated Creatinine Clearance: 11.1 mL/min (A) (by C-G formula based on SCr of 2.42 mg/dL (H)). Liver Function Tests: Recent Labs  Lab 06/02/21 0906  AST 22  ALT 17  ALKPHOS 67  BILITOT 1.7*  PROT 5.3*  ALBUMIN 3.0*   No results for input(s): LIPASE, AMYLASE in the last 168 hours. No results for input(s): AMMONIA in the last 168 hours. Coagulation Profile: No results for input(s): INR, PROTIME in the last 168 hours.  Cardiac Enzymes: No results for input(s): CKTOTAL, CKMB, CKMBINDEX, TROPONINI in the last 168 hours. BNP (last 3 results) No results for input(s): PROBNP in the last 8760 hours. HbA1C: No results for input(s): HGBA1C in the last 72 hours. CBG: Recent Labs  Lab 06/02/21 0431 06/02/21 0810  06/02/21 1145 06/02/21 1624 06/02/21 2000  GLUCAP 107* 129* 121* 228* 180*   Lipid Profile: No results for input(s): CHOL, HDL, LDLCALC, TRIG, CHOLHDL, LDLDIRECT in the last 72 hours. Thyroid Function Tests: No results for input(s): TSH, T4TOTAL, FREET4, T3FREE, THYROIDAB in the last 72 hours. Anemia Panel: No results for input(s): VITAMINB12, FOLATE, FERRITIN, TIBC, IRON, RETICCTPCT in the last 72 hours. Urine analysis:    Component Value Date/Time   COLORURINE YELLOW 06/01/2021 [place] 06/01/2021 1114   LABSPEC 1.011 06/01/2021 1114   PHURINE 6.0 06/01/2021 1114   GLUCOSEU NEGATIVE 06/01/2021 1114   HGBUR NEGATIVE 06/01/2021 1114   BILIRUBINUR NEGATIVE 06/01/2021 1114   KETONESUR 5 (A) 06/01/2021 1114   PROTEINUR 100 (A) 06/01/2021 1114   NITRITE NEGATIVE 06/01/2021 1114   LEUKOCYTESUR NEGATIVE 06/01/2021 1114   Sepsis Labs: '@LABRCNTIP'$ (procalcitonin:4,lacticidven:4)  ) Recent Results (from the past 240 hour(s))  SARS CORONAVIRUS 2 (TAT 6-24 HRS) Nasopharyngeal Nasopharyngeal Swab     Status: None    Collection Time: 05/31/21  7:27 PM   Specimen: Nasopharyngeal Swab  Result Value Ref Range Status   SARS Coronavirus 2 NEGATIVE NEGATIVE Final    Comment: (NOTE) SARS-CoV-2 target nucleic acids are NOT DETECTED.  The SARS-CoV-2 RNA is generally detectable in upper and lower respiratory specimens during the acute phase of infection. Negative results do not preclude SARS-CoV-2 infection, do not rule out co-infections with other pathogens, and should not be used as the sole basis for treatment or other patient management decisions. Negative results must be combined with clinical observations, patient history, and epidemiological information. The expected result is Negative.  Fact Sheet for Patients: SugarRoll.be  Fact Sheet for Healthcare Providers: https://www.woods-mathews.com/  This test is not yet approved or cleared by the [place] FDA and  has been authorized for detection and/or diagnosis of SARS-CoV-2 by FDA under an Emergency Use Authorization (EUA). This EUA will remain  in effect (meaning this test can be used) for the duration of the COVID-19 declaration under Se ction 564(b)(1) of the Act, 21 U.S.C. section 360bbb-3(b)(1), unless the authorization is terminated or revoked sooner.  Performed at [facility], [place] [address], [place], [place] [address]   Urine Culture     Status: None   Collection Time: 06/01/21 11:14 AM   Specimen: Urine, Catheterized  Result Value Ref Range Status   Specimen Description URINE, CATHETERIZED  Final   Special Requests NONE  Final   Culture   Final    NO GROWTH Performed at [facility], [address], [place], [place] [address]    Report Status 06/02/2021 FINAL  Final  SARS CORONAVIRUS 2 (TAT 6-24 HRS) Nasopharyngeal Nasopharyngeal Swab     Status: None   Collection Time: 06/08/21 11:34 AM   Specimen: Nasopharyngeal Swab  Result Value Ref Range Status   SARS Coronavirus 2  NEGATIVE NEGATIVE Final    Comment: (NOTE) SARS-CoV-2 target nucleic acids are NOT DETECTED.  The SARS-CoV-2 RNA is generally detectable in upper and lower respiratory specimens during the acute phase of infection. Negative results do not preclude SARS-CoV-2 infection, do not rule out co-infections with other pathogens, and should not be used as the sole basis for treatment or other patient management decisions. Negative results must be combined with clinical observations, patient history, and epidemiological information. The expected result is Negative.  Fact Sheet for Patients: SugarRoll.be  Fact Sheet for Healthcare Providers: https://www.woods-mathews.com/  This test is not yet approved or cleared  by the [place] and  has been authorized for detection and/or diagnosis of SARS-CoV-2 by FDA under an Emergency Use Authorization (EUA). This EUA will remain  in effect (meaning this test can be used) for the duration of the COVID-19 declaration under Se ction 564(b)(1) of the Act, 21 U.S.C. section 360bbb-3(b)(1), unless the authorization is terminated or revoked sooner.  Performed at [facility], [place] [address], [place], [place] [address]       Studies: No results found.  Scheduled Meds:   stroke: mapping our early stages of recovery book   Does not apply Once   apixaban  2.5 mg Oral BID   aspirin EC  81 mg Oral Daily   atorvastatin  40 mg Oral Daily   diltiazem  180 mg Oral Daily   ezetimibe  10 mg Oral Daily   feeding supplement  237 mL Oral BID BM   hydrALAZINE  10 mg Oral Q8H   metoprolol tartrate  100 mg Oral BID   multivitamin with minerals  1 tablet Oral Daily    Continuous Infusions:   LOS: 7 days     Kayleen Memos, MD [facility] Pager [phone]  If 7PM-7AM, please contact night-coverage [email or web address] Password [facility] 06/08/2021, 6:19 PM

## 2021-06-08 NOTE — Consult Note (Signed)
   [facility] CM Inpatient Consult   06/08/2021  Heather Saunders 1924/11/21 [id number]  [place] [facility] [ACO] Patient: [employer] Medicare   Primary Care Provider:  Kathyrn Lass, MD with [place], [facility]  Patient screened for length of stay hospitalization  assess for potential [place] Management service needs for post hospital transition.  Review of patient's medical record reveals patient is being recommended for a skilled nursing facility level of care.   Plan:  No [facility] Community needs noted at this time as transition of care needs are to be met at a skilled nursing facility level of care, if patient transitions to a facility.  Will sign off when disposition is completed.  For questions contact:   Natividad Brood, RN BSN [facility] Liaison  [phone] business mobile phone Toll free office [phone]  Fax number: [phone] [email or web address]

## 2021-06-08 NOTE — TOC Progression Note (Addendum)
Transition of Care [facility]) - Progression Note    Patient Details  Name: Heather Saunders MRN: [id number] Date of Birth: 06-Feb-1925  [facility] of [facility]) CM/SW Contact  Emeterio Reeve, [place] Phone Number: 06/08/2021, 12:23 PM  Clinical Narrative:     CSW received test message requesting  a call from MD. CSW informed MD. Pts insurance [name] is still pending. CSW requested covid test yesterday, it was collected today at 11:30am.  4:00pm- insurance [name] is still pending.   TOC will continue to follow.   Expected Discharge Plan: Home/Self Care Barriers to Discharge: Other (must enter comment)  Expected Discharge Plan and Services Expected Discharge Plan: Home/Self Care     Post Acute Care Choice: NA                                         Social Determinants of Health (SDOH) Interventions    Readmission Risk Interventions No flowsheet data found.  Emeterio Reeve, LCSW Clinical Social Worker

## 2021-06-08 NOTE — Progress Notes (Signed)
Physical Therapy Treatment Patient Details Name: Heather Saunders MRN: [id number] DOB: 1924/12/17 Today's Date: 06/08/2021    History of Present Illness Pt is a 87 yr old female who presented due to change in cognition. Pt found to have an acute left caudate nucleus CVA.  PMH but to limted to: pulmonary HTN, HTNN, afib, CKD, chronic diastolic congestive heart faluire    PT Comments    Pt demonstrates improved activity tolerance and mobility quality this session. Pt is able to ambulated for increased distances although needing verbal cues and intermittent physical assistance for direction. Pt continues to demonstrate significant cognitive deficits as well as posterior lean with initial sitting and standing which place her at a high risk for falls. PT continues to recommend SNF placement at this time.   Follow Up Recommendations  SNF     Equipment Recommendations  Rolling walker with 5" wheels;3in1 (PT);Wheelchair (measurements PT)    Recommendations for Other Services       Precautions / Restrictions Precautions Precautions: Fall Precaution Comments: High fall risk Restrictions Weight Bearing Restrictions: No    Mobility  Bed Mobility Overal bed mobility: Needs Assistance Bed Mobility: Supine to Sit     Supine to sit: Min assist;HOB elevated     General bed mobility comments: pt requries tactile cues to initiated LE mobility, verbal and tactile cues to pull up into sitting with PT HHA    Transfers Overall transfer level: Needs assistance Equipment used: Rolling walker (2 wheeled) Transfers: Sit to/from [facility] Sit to Stand: Min assist Stand pivot transfers: Mod assist       General transfer comment: verbal cues for hand placement and increased trunk flexion. physical assistance to power up into standing  Ambulation/Gait Ambulation/Gait assistance: Min assist Gait Distance (Feet): 30 Feet (30' x 2 trials) Assistive device: Rolling walker (2  wheeled) Gait Pattern/deviations: Step-through pattern Gait velocity: reduced Gait velocity interpretation: <1.8 ft/sec, indicate of risk for recurrent falls General Gait Details: pt with slowed step-through gait, reduced stride length, assistance required to mobilize walker around obstacles in room at times   Stairs             Wheelchair Mobility    Modified Rankin (Stroke Patients Only) Modified Rankin (Stroke Patients Only) Pre-Morbid Rankin Score: No significant disability Modified Rankin: Moderately severe disability     Balance Overall balance assessment: Needs assistance Sitting-balance support: No upper extremity supported;Feet supported Sitting balance-Leahy Scale: Fair   Postural control: Posterior lean (posterior lean initially in sitting and standing) Standing balance support: Bilateral upper extremity supported Standing balance-Leahy Scale: Poor                              Cognition Arousal/Alertness: Awake/alert Behavior During Therapy: WFL for tasks assessed/performed Overall Cognitive Status: Impaired/Different from baseline Area of Impairment: Orientation;Attention;Memory;Following commands;Safety/judgement;Awareness;Problem solving                 Orientation Level: Disoriented to;Place;Time;Situation Current Attention Level: Sustained Memory: Decreased recall of precautions;Decreased short-term memory Following Commands: Follows one step commands consistently;Follows one step commands with increased time Safety/Judgement: Decreased awareness of safety;Decreased awareness of deficits Awareness: Intellectual Problem Solving: Slow processing;Decreased initiation;Requires verbal cues;Requires tactile cues        Exercises General Exercises - Lower Extremity Ankle Circles/Pumps: AROM;Both;10 reps Gluteal Sets: AROM;Both;5 reps Heel Slides: AAROM;Both;10 reps Straight Leg Raises: AROM;Both;5 reps    General Comments General  comments (skin integrity, edema, etc.): VSS  on RA      Pertinent Vitals/Pain Pain Assessment: No/denies pain    Home Living                      Prior Function            PT Goals (current goals can now be found in the care plan section) Acute Rehab PT Goals Patient Stated Goal: No goals stated. Progress towards PT goals: Progressing toward goals    Frequency    Min 3X/week      PT Plan Current plan remains appropriate    Co-evaluation              AM-PAC PT "6 Clicks" Mobility   Outcome Measure  Help needed turning from your back to your side while in a flat bed without using bedrails?: A Little Help needed moving from lying on your back to sitting on the side of a flat bed without using bedrails?: A Little Help needed moving to and from a bed to a chair (including a wheelchair)?: A Lot Help needed standing up from a chair using your arms (e.g., wheelchair or bedside chair)?: A Little Help needed to walk in hospital room?: A Little Help needed climbing 3-5 steps with a railing? : Total 6 Click Score: 15    End of Session Equipment Utilized During Treatment: Gait belt Activity Tolerance: Patient tolerated treatment well Patient left: in chair;with call bell/phone within reach;with chair alarm set Nurse Communication: Mobility status PT Visit Diagnosis: Other abnormalities of gait and mobility (R26.89);Muscle weakness (generalized) (M62.81);Other symptoms and signs involving the nervous system (R29.898)     Time: [id number] PT Time Calculation (min) (ACUTE ONLY): 26 min  Charges:  $Gait Training: 8-22 mins $Therapeutic Activity: 8-22 mins                     Zenaida Niece, PT, DPT Acute Rehabilitation Pager: [phone]    Zenaida Niece 06/08/2021, 11:12 AM

## 2021-06-09 DIAGNOSIS — I63312 Cerebral infarction due to thrombosis of left middle cerebral artery: Secondary | ICD-10-CM | POA: Diagnosis not present

## 2021-06-09 NOTE — Discharge Summary (Signed)
Discharge Summary  Heather Saunders [id number] DOB: 03-15-1925  PCP: Kathyrn Lass, MD  Admit date: 05/31/2021 Discharge date: 06/09/2021  Time spent: 35 minutes  Recommendations for Outpatient Follow-up:  Follow-up with neurology Follow-up with cardiology Follow-up with your primary care provider Take your medications as prescribed Continue PT OT with assistance and fall precautions  Discharge Diagnoses:  Active Hospital Problems   Diagnosis Date Noted   CVA (cerebral vascular accident) ([place]) 06/01/2021   Protein-calorie malnutrition, severe 06/08/2021   CKD (chronic kidney disease), stage IV (HCC)    Chronic diastolic congestive heart failure ([place])    Acute encephalopathy 05/31/2021   Atrial fibrillation ([place]) 05/31/2021   Pulmonary HTN ([place])    Essential hypertension 08/03/2018    Resolved Hospital Problems  No resolved problems to display.    Discharge Condition: Stable  Diet recommendation: Resume previous diet.  Vitals:   06/09/21 0806 06/09/21 1207  BP: (!) 178/87 (!) 161/105  Pulse: 72 78  Resp: 19 18  Temp: 97.7 F (36.5 C) 98.4 F (36.9 C)  SpO2: 100% 97%    History of present illness:  Patient 87 year old female history of A. fib on Eliquis, pulmonary hypertension, chronic kidney disease stage IV, anemia, presented to the ED with confusion as noted by home health aide with some associated slurred speech.  Work-up in the ED with head CT being unremarkable, MRI head consistent with acute CVA.  Patient admitted for stroke work-up.  Hospital course complicated by generalized weakness for which PT OT recommended SNF placement.  Uncontrolled hypertension, home Cardizem dose was increased and she was started on p.o. hydralazine 10 mg 3 times daily.   06/09/21: She was seen at her bedside.  She has no new complaints.  She is alert and pleasantly confused.    Hospital Course:  Principal Problem:   CVA (cerebral vascular accident) [facility]) Active Problems:    Essential hypertension   Pulmonary HTN ([place])   Acute encephalopathy   Atrial fibrillation (HCC)   CKD (chronic kidney disease), stage IV (HCC)   Chronic diastolic congestive heart failure (HCC)   Protein-calorie malnutrition, severe  1 acute left caudate nucleus CVA/acute metabolic encephalopathy -Patient presented with confusion and slurred speech. -Head CT done on admission negative for any acute abnormalities. -MRI brain/MRA head done consistent with small acute infarct of the left caudate nucleus, no hemorrhage or mass-effect.  Normal intracranial MRA. -Per neurology patient with left caudate head infarct likely secondary to small vessel disease source in the setting of A. fib and on chronic anticoagulation.  -2D echo done with normal EF, no source of emboli. -Carotid Dopplers done with no significant ICA stenosis. -Patient noted to have significant lethargy on 06/01/2021 after patient noted to have received IV Ativan the evening of 05/31/2021 prior to MRI.  -Patient more alert today, following simple commands. -Repeat head CT done with no hemorrhagic conversion of acute CVA and no new infarct. -Patient noted to have been started on aspirin on admission.  Neurology consultation which was done. -Patient seen by stroke team who recommended resumption of Eliquis 2.5 mg twice daily and addition of aspirin 81 mg daily for further stroke prophylaxis per stroke MD. -PT/OT/SLP. -Needs SNF placement.  TOC consulted. -Stroke team was following but have signed off.   -Outpatient follow-up with neurology.    2.  Hypertension uncontrolled -BP not at goal, elevated Increased her home dose of Cardizem to 180 mg daily Continue home Lopressor 100 mg twice daily.    Added p.o. hydralazine 10  mg 3 times daily -Continue to closely monitor and adjust medications accordingly.  3.  History of A. fib -Resume home regimen Cardizem and increase Lopressor back to home dose 100 mg twice daily for rate control.    -Eliquis for anticoagulation.    4.  Chronic kidney disease stage IV -Stable.  5.  History of diastolic CHF/pulmonary hypertension -Stable. -Increase Lopressor back to home dose of 100 mg twice daily.   -Resume home regimen Cardizem.   -Continue Zetia, statin, aspirin.   -Follow.  6.  History of right atrial thrombus -2D echo done 2 weeks prior to admission with no signs of thrombus noted. -Repeat 2D echo done with EF of 55 to 60%, NWMA, mildly reduced right ventricular systolic function, right ventricular size normal, mildly elevated pulmonary artery systolic pressure, mild to moderate TVR. -No thrombus noted on 2D echo -We will resume home regimen Cardizem.   -Increase metoprolol back to home dose of 100 mg twice daily.   -Continue Eliquis.   Generalized weakness/ambulatory dysfunction OT recommending SNF CSW is working on SNF placement. Awaiting insurance authorization.        DVT prophylaxis: Eliquis Code Status: Full Family Communication: No family at bedside.  Disposition:    Status is: Inpatient   The patient will require care spanning > 2 midnights and should be moved to inpatient because: Inpatient level of care appropriate due to severity of illness   Dispo: The patient is from: Home              Anticipated d/c is to: SNF versus CIR              Patient currently is currently stable medically for discharge.              Difficult to place patient No           Consultants:  Neurology: Dr.Khaliqdina 05/31/2021   Procedures: CT head 06/01/2021, 05/31/2021 MRI brain 05/31/2021 MRA head 05/31/2021 2D echo 06/01/2021 Carotid Dopplers 06/01/2021     Antimicrobials:  None  Discharge Exam: BP (!) 161/105 (BP Location: Left Arm)   Pulse 78   Temp 98.4 F (36.9 C) (Oral)   Resp 18   Ht 5' 3.5" (1.613 m)   Wt 49.4 kg   SpO2 97%   BMI 19.01 kg/m  General: 87 y.o. year-old female well developed well nourished in no acute distress.  Alert and pleasantly  confused. Cardiovascular: Regular rate and rhythm with no rubs or gallops.  No thyromegaly or JVD noted.   Respiratory: Clear to auscultation with no wheezes or rales. Good inspiratory effort. Abdomen: Soft nontender nondistended with normal bowel sounds x4 quadrants. Musculoskeletal: No lower extremity edema. 2/4 pulses in all 4 extremities. Skin: No ulcerative lesions noted or rashes, Psychiatry: Mood is appropriate for condition and setting  Discharge Instructions You were cared for by a hospitalist during your hospital stay. If you have any questions about your discharge medications or the care you received while you were in the hospital after you are discharged, you can call the unit and asked to speak with the hospitalist on call if the hospitalist that took care of you is not available. Once you are discharged, your primary care physician will handle any further medical issues. Please note that NO REFILLS for any discharge medications will be authorized once you are discharged, as it is imperative that you return to your primary care physician (or establish a relationship with a primary care physician if you  do not have one) for your aftercare needs so that they can reassess your need for medications and monitor your lab values.  Discharge Instructions     Ambulatory referral to Neurology   Complete by: As directed    Follow up with stroke clinic NP (Jessica Vanschaick or Cecille Rubin, if both not available, consider Zachery Dauer, or A[place]) at [facility] in about 4 weeks. Thanks.      Allergies as of 06/09/2021       Reactions   Boniva [ibandronic Acid]    Sore throat   Fosamax [alendronate Sodium]    Sore throat        Medication List     STOP taking these medications    furosemide 20 MG tablet Commonly known as: Lasix   sodium bicarbonate 650 MG tablet       TAKE these medications    acetaminophen 500 MG tablet Commonly known as: TYLENOL Take 500 mg by mouth every  6 (six) hours as needed for mild pain.   apixaban 2.5 MG Tabs tablet Commonly known as: ELIQUIS Take 1 tablet (2.5 mg total) by mouth 2 (two) times daily.   aspirin 81 MG EC tablet Take 1 tablet (81 mg total) by mouth daily. Swallow whole.   atorvastatin 40 MG tablet Commonly known as: LIPITOR Take 40 mg by mouth daily.   diltiazem 180 MG 24 hr capsule Commonly known as: CARDIZEM CD Take 1 capsule (180 mg total) by mouth daily. What changed:  medication strength how much to take   ezetimibe 10 MG tablet Commonly known as: ZETIA TAKE 1 TABLET BY MOUTH EVERY DAY   feeding supplement Liqd Take 237 mLs by mouth 2 (two) times daily between meals for 7 days.   hydrALAZINE 10 MG tablet Commonly known as: APRESOLINE Take 1 tablet (10 mg total) by mouth 2 (two) times daily.   metoprolol tartrate 100 MG tablet Commonly known as: LOPRESSOR Take 1 tablet (100 mg total) by mouth 2 (two) times daily.   multivitamin capsule Take 1 capsule by mouth daily.   polyethylene glycol 17 g packet Commonly known as: MIRALAX / GLYCOLAX Take 17 g by mouth daily as needed for mild constipation.   Vitamin D3 50 MCG (2000 UT) capsule Take 2,000 Units by mouth daily.               Durable Medical Equipment  (From admission, onward)           Start     Ordered   06/08/21 1156  For home use only DME 3 n 1  Once        06/08/21 1156   06/08/21 1156  For home use only DME standard manual wheelchair with seat cushion  Once       Comments: Patient suffers from ambulatory dysfunction which impairs their ability to perform daily activities like walking in the home.  A cane will not resolve issue with performing activities of daily living. A wheelchair will allow patient to safely perform daily activities. Patient can safely propel the wheelchair in the home or has a caregiver who can provide assistance. Length of need 6 months. Accessories: elevating leg rests, wheel locks, extensions and  anti-tippers.   06/08/21 1156   06/08/21 1155  For home use only DME Walker rolling  Once       Question Answer Comment  Walker: With [place]   Patient needs a walker to treat with the following condition Ambulatory dysfunction  06/08/21 1156           Allergies  Allergen Reactions   Boniva [Ibandronic Acid]     Sore throat   Fosamax [Alendronate Sodium]     Sore throat    Follow-up Information     [facility]. Schedule an appointment as soon as possible for a visit in 1 month(s).   Specialty: Neurology Why: stroke clinic Contact information: [place] [phone]        Kathyrn Lass, MD. Call today.   Specialty: Family Medicine Why: Please call for a posthospital follow-up Contact information: [place] [address] [phone]         Fay Records, MD .   Specialty: Cardiology Contact information: [place] [address] [phone]                  The results of significant diagnostics from this hospitalization (including imaging, microbiology, ancillary and laboratory) are listed below for reference.    Significant Diagnostic Studies: CT HEAD WO CONTRAST (5MM)  Result Date: 06/01/2021 CLINICAL DATA:  Neuro deficit EXAM: CT HEAD WITHOUT CONTRAST TECHNIQUE: Contiguous axial images were obtained from the base of the skull through the vertex without intravenous contrast. COMPARISON:  CT head 05/31/2021 FINDINGS: Brain: There is hypodensity centered in the left caudate body extending towards the lentiform nucleus consistent with evolving subacute infarct as seen on the prior MRI. There is no evidence of hemorrhagic conversion. There is no new infarct. There is no evidence of acute intracranial hemorrhage or extra-axial fluid collection. Mild parenchymal volume loss is unchanged. Remote infarcts in the bilateral basal  ganglia are unchanged. There is no mass lesion. There is no midline shift. The ventricles are stable in size. Vascular: No hyperdense vessel or unexpected calcification. Skull: Normal. Negative for fracture or focal lesion. Sinuses/Orbits: The paranasal sinuses are clear. Bilateral lens implants are in place. The globes and orbits are otherwise unremarkable. Other: None. IMPRESSION: 1. Evolving subacute infarct in the left caudate body without evidence of hemorrhagic transformation. 2. No new infarct or hemorrhage. Electronically Signed   By: Valetta Mole M.D.   On: 06/01/2021 14:37   CT HEAD WO CONTRAST  Result Date: 05/31/2021 CLINICAL DATA:  Altered mental status. EXAM: CT HEAD WITHOUT CONTRAST TECHNIQUE: Contiguous axial images were obtained from the base of the skull through the vertex without intravenous contrast. COMPARISON:  None. FINDINGS: Brain: Mild age-related atrophy and chronic microvascular ischemic changes. There is no acute intracranial hemorrhage. No mass effect midline shift. No extra-axial fluid collection. Vascular: No hyperdense vessel or unexpected calcification. Skull: Normal. Negative for fracture or focal lesion. Sinuses/Orbits: No acute finding. Other: None IMPRESSION: 1. No acute intracranial pathology. 2. Mild age-related atrophy and chronic microvascular ischemic changes. Electronically Signed   By: Anner Crete M.D.   On: 05/31/2021 19:19   MR ANGIO HEAD WO CONTRAST  Result Date: 05/31/2021 CLINICAL DATA:  Acute neurologic deficit EXAM: MRI HEAD WITHOUT CONTRAST MRA HEAD WITHOUT CONTRAST TECHNIQUE: Multiplanar, multi-echo pulse sequences of the brain and surrounding structures were acquired without intravenous contrast. Angiographic images of the Circle of Willis were acquired using MRA technique without intravenous contrast. COMPARISON:  No pertinent prior exam. 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 No acute or chronic  hemorrhage. There is multifocal hyperintense T2-weighted signal within the white matter. Generalized volume loss without a clear lobar predilection.  The midline structures are normal. Vascular: Major flow voids are preserved. Skull and upper cervical spine: Normal calvarium and skull base. Visualized upper cervical spine and soft tissues are normal. Sinuses/Orbits:No paranasal sinus fluid levels or advanced mucosal thickening. No mastoid or middle ear effusion. Normal orbits. MRA HEAD FINDINGS POSTERIOR CIRCULATION: --Vertebral arteries: Normal --Inferior cerebellar arteries: Normal. --Basilar artery: Normal. --Superior cerebellar arteries: Normal. --Posterior cerebral arteries: Normal. ANTERIOR CIRCULATION: --Intracranial internal carotid arteries: Normal. --Anterior cerebral arteries (ACA): Normal. --Middle cerebral arteries (MCA): Normal. ANATOMIC VARIANTS: Fetal origin of both posterior cerebral arteries. IMPRESSION: 1. 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 No hemorrhage or mass effect. 2. Normal intracranial MRA. Electronically Signed   By: Ulyses Jarred M.D.   On: 05/31/2021 22:26   MR BRAIN WO CONTRAST  Result Date: 05/31/2021 CLINICAL DATA:  Acute neurologic deficit EXAM: MRI HEAD WITHOUT CONTRAST MRA HEAD WITHOUT CONTRAST TECHNIQUE: Multiplanar, multi-echo pulse sequences of the brain and surrounding structures were acquired without intravenous contrast. Angiographic images of the Circle of Willis were acquired using MRA technique without intravenous contrast. COMPARISON:  No pertinent prior exam. 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 No acute or chronic hemorrhage. There is multifocal hyperintense T2-weighted signal within the white matter. Generalized volume loss without a clear lobar predilection. The midline structures are normal. Vascular: Major flow voids are preserved. Skull and upper cervical spine: Normal calvarium and skull base.  Visualized upper cervical spine and soft tissues are normal. Sinuses/Orbits:No paranasal sinus fluid levels or advanced mucosal thickening. No mastoid or middle ear effusion. Normal orbits. MRA HEAD FINDINGS POSTERIOR CIRCULATION: --Vertebral arteries: Normal --Inferior cerebellar arteries: Normal. --Basilar artery: Normal. --Superior cerebellar arteries: Normal. --Posterior cerebral arteries: Normal. ANTERIOR CIRCULATION: --Intracranial internal carotid arteries: Normal. --Anterior cerebral arteries (ACA): Normal. --Middle cerebral arteries (MCA): Normal. ANATOMIC VARIANTS: Fetal origin of both posterior cerebral arteries. IMPRESSION: 1. 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 No hemorrhage or mass effect. 2. Normal intracranial MRA. Electronically Signed   By: Ulyses Jarred M.D.   On: 05/31/2021 22:26   ECHOCARDIOGRAM COMPLETE  Result Date: 06/01/2021    ECHOCARDIOGRAM REPORT   Patient Name:   Heather Saunders Date of Exam: 06/01/2021 Medical Rec #:  [id number]        Height:       63.5 in Accession #:    [id number]       Weight:       109.0 lb Date of Birth:  1924/11/02        BSA:          1.503 m Patient Age:    17 years         BP:           158/87 mmHg Patient Gender: F                HR:           70 bpm. Exam Location:  Inpatient Procedure: 2D Echo, Cardiac Doppler and Color Doppler Indications:    Stroke I63.9  History:        Patient has prior history of Echocardiogram examinations, most                 recent 05/16/2021. Previous Myocardial Infarction; Risk                 Factors:Dyslipidemia and Hypertension.  Sonographer:    Bernadene Person RDCS Referring Phys: [place]  1.  Left ventricular ejection fraction, by estimation, is 55 to 60%. The left ventricle has normal function. The left ventricle has no regional wall motion abnormalities. Left ventricular diastolic parameters are consistent with Grade I diastolic dysfunction (impaired relaxation).  2. Right  ventricular systolic function is mildly reduced. The right ventricular size is normal. There is mildly elevated pulmonary artery systolic pressure.  3. The mitral valve is grossly normal. No evidence of mitral valve regurgitation.  4. The aortic valve is tricuspid. Aortic valve regurgitation is trivial. Aortic valve sclerosis is present, with no evidence of aortic valve stenosis.  5. The inferior vena cava is dilated in size with >50% respiratory variability, suggesting right atrial pressure of 8 mmHg.  6. Tricuspid valve regurgitation is mild to moderate. Comparison(s): A prior study was performed on 05/16/21. Prior RVSP 65 mm Hg. FINDINGS  Left Ventricle: Left ventricular ejection fraction, by estimation, is 55 to 60%. The left ventricle has normal function. The left ventricle has no regional wall motion abnormalities. The left ventricular internal cavity size was small. There is no left ventricular hypertrophy. Left ventricular diastolic parameters are consistent with Grade I diastolic dysfunction (impaired relaxation). Right Ventricle: The right ventricular size is normal. No increase in right ventricular wall thickness. Right ventricular systolic function is mildly reduced. There is mildly elevated pulmonary artery systolic pressure. The tricuspid regurgitant velocity  is 2.78 m/s, and with an assumed right atrial pressure of 8 mmHg, the estimated right ventricular systolic pressure is [id number] mmHg. Left Atrium: Left atrial size was normal in size. Right Atrium: Right atrial size was normal in size. Pericardium: Trivial pericardial effusion is present. Mitral Valve: The mitral valve is grossly normal. No evidence of mitral valve regurgitation. Tricuspid Valve: The tricuspid valve is normal in structure. Tricuspid valve regurgitation is mild to moderate. Aortic Valve: The aortic valve is tricuspid. There is mild calcification of the aortic valve. There is mild thickening of the aortic valve. Aortic valve  regurgitation is trivial. Aortic regurgitation PHT measures 932 msec. Mild aortic valve sclerosis is present, with no evidence of aortic valve stenosis. Pulmonic Valve: The pulmonic valve was not well visualized. Pulmonic valve regurgitation is mild to moderate. No evidence of pulmonic stenosis. Aorta: The aortic root and ascending aorta are structurally normal, with no evidence of dilitation. Venous: The inferior vena cava is dilated in size with greater than 50% respiratory variability, suggesting right atrial pressure of 8 mmHg. IAS/Shunts: The atrial septum is grossly normal.  LEFT VENTRICLE PLAX 2D LVIDd:         3.10 cm  Diastology LVIDs:         2.20 cm  LV e' medial:    3.09 cm/s LV PW:         1.10 cm  LV E/e' medial:  11.4 LV IVS:        0.90 cm  LV e' lateral:   3.32 cm/s LVOT diam:     2.00 cm  LV E/e' lateral: 10.6 LV SV:         54 LV SV Index:   36 LVOT Area:     3.14 cm  RIGHT VENTRICLE RV S prime:     6.98 cm/s TAPSE (M-mode): 1.3 cm LEFT ATRIUM             Index       RIGHT ATRIUM           Index LA diam:        3.20 cm 2.13 cm/m  RA Area:     11.20 cm LA Vol (A2C):   38.8 ml 25.82 ml/m RA Volume:   25.30 ml  16.84 ml/m LA Vol (A4C):   38.7 ml 25.75 ml/m LA Biplane Vol: 39.5 ml 26.28 ml/m  AORTIC VALVE             PULMONIC VALVE LVOT Vmax:   88.93 cm/s  PR End Diast Vel: 9.61 msec LVOT Vmean:  60.933 cm/s LVOT VTI:    0.171 m AI PHT:      932 msec  AORTA Ao Root diam: 3.10 cm Ao Asc diam:  3.30 cm MITRAL VALVE               TRICUSPID VALVE MV Area (PHT): 3.72 cm    TR Peak grad:   30.9 mmHg MV Decel Time: 204 msec    TR Vmax:        278.00 cm/s MV E velocity: 35.20 cm/s MV A velocity: 84.20 cm/s  SHUNTS MV E/A ratio:  0.42        Systemic VTI:  0.17 m                            Systemic Diam: 2.00 cm Rudean Haskell MD Electronically signed by Rudean Haskell MD Signature Date/Time: 06/01/2021/12:16:58 PM    Final    ECHOCARDIOGRAM COMPLETE  Result Date: 05/16/2021     ECHOCARDIOGRAM REPORT   Patient Name:   Heather Saunders Date of Exam: 05/16/2021 Medical Rec #:  [id number]        Height:       63.5 in Accession #:    [id number]       Weight:       109.0 lb Date of Birth:  1925/01/05        BSA:          1.503 m Patient Age:    70 years         BP:           150/88 mmHg Patient Gender: F                HR:           96 bpm. Exam Location:  [address] Procedure: 2D Echo, Cardiac Doppler and Color Doppler Indications:    I51.89 Right atrial mass  History:        Patient has prior history of Echocardiogram examinations, most                 recent 03/19/2021. CHF, CAD and Previous Myocardial Infarction,                 Arrythmias:Atrial Fibrillation; Risk Factors:Hypertension and                 Dyslipidemia. CKD stage 4. Right atrial mass.  Sonographer:    Basilia Jumbo BS, RDCS Referring Phys: [place]  1. Prominent eustachian valve seen in the RA. No evidence of thrombus seen on prior TEE. Would consider repeat TEE to definitively exclude.  2. Left ventricular ejection fraction, by estimation, is 55 to 60%. The left ventricle has normal function. The left ventricle has no regional wall motion abnormalities. There is moderate concentric left ventricular hypertrophy. Left ventricular diastolic function could not be evaluated.  3. Right ventricular systolic function is low normal. The right ventricular size is normal. There is moderately elevated pulmonary artery systolic pressure. The estimated  right ventricular systolic pressure is 0000000 mmHg.  4. Left atrial size was mildly dilated.  5. A small pericardial effusion is present. The pericardial effusion is circumferential.  6. The mitral valve is grossly normal. Mild to moderate mitral valve regurgitation.  7. Tricuspid valve regurgitation is moderate.  8. The aortic valve is tricuspid. There is mild calcification of the aortic valve. There is mild thickening of the aortic valve. Aortic valve regurgitation is  trivial. Mild aortic valve sclerosis is present, with no evidence of aortic valve stenosis.  9. The inferior vena cava is dilated in size with <50% respiratory variability, suggesting right atrial pressure of 15 mmHg. Comparison(s): 03/19/21 EF 60-65%. PA pressure 46mHg. FINDINGS  Left Ventricle: Left ventricular ejection fraction, by estimation, is 55 to 60%. The left ventricle has normal function. The left ventricle has no regional wall motion abnormalities. The left ventricular internal cavity size was small. There is moderate  concentric left ventricular hypertrophy. Left ventricular diastolic function could not be evaluated due to atrial fibrillation. Left ventricular diastolic function could not be evaluated. Right Ventricle: The right ventricular size is normal. No increase in right ventricular wall thickness. Right ventricular systolic function is low normal. There is moderately elevated pulmonary artery systolic pressure. The tricuspid regurgitant velocity  is 3.54 m/s, and with an assumed right atrial pressure of 15 mmHg, the estimated right ventricular systolic pressure is 60000000mmHg. Left Atrium: Left atrial size was mildly dilated. Right Atrium: Right atrial size was normal in size. Prominent Eustachian valve. Pericardium: A small pericardial effusion is present. The pericardial effusion is circumferential. Mitral Valve: The mitral valve is grossly normal. Mild to moderate mitral valve regurgitation. Tricuspid Valve: The tricuspid valve is grossly normal. Tricuspid valve regurgitation is moderate . No evidence of tricuspid stenosis. Aortic Valve: The aortic valve is tricuspid. There is mild calcification of the aortic valve. There is mild thickening of the aortic valve. Aortic valve regurgitation is trivial. Aortic regurgitation PHT measures 503 msec. Mild aortic valve sclerosis is present, with no evidence of aortic valve stenosis. Pulmonic Valve: The pulmonic valve was grossly normal. Pulmonic valve  regurgitation is mild. No evidence of pulmonic stenosis. Aorta: The aortic root and ascending aorta are structurally normal, with no evidence of dilitation. Venous: The inferior vena cava is dilated in size with less than 50% respiratory variability, suggesting right atrial pressure of 15 mmHg. IAS/Shunts: The atrial septum is grossly normal.  LEFT VENTRICLE PLAX 2D LVIDd:         3.00 cm LVIDs:         1.90 cm LV PW:         1.60 cm LV IVS:        1.50 cm LVOT diam:     1.90 cm LV SV:         53 LV SV Index:   35 LVOT Area:     2.84 cm  RIGHT VENTRICLE RV Basal diam:  3.10 cm RV S prime:     7.71 cm/s TAPSE (M-mode): 1.1 cm RVSP:           65.1 mmHg LEFT ATRIUM             Index       RIGHT ATRIUM            Index LA diam:        3.70 cm 2.46 cm/m  RA Pressure: 15.00 mmHg LA Vol (A2C):   62.6 ml 41.66 ml/m RA Area:  13.40 cm LA Vol (A4C):   51.3 ml 34.14 ml/m RA Volume:   34.20 ml   22.76 ml/m LA Biplane Vol: 59.4 ml 39.53 ml/m  AORTIC VALVE LVOT Vmax:   81.70 cm/s LVOT Vmean:  59.600 cm/s LVOT VTI:    0.187 m AI PHT:      503 msec  AORTA Ao Root diam: 3.40 cm Ao Asc diam:  3.40 cm MR Peak grad:    165.9 mmHg  TRICUSPID VALVE MR Mean grad:    105.0 mmHg  TR Peak grad:   50.1 mmHg MR Vmax:         644.00 cm/s TR Vmax:        354.00 cm/s MR Vmean:        493.0 cm/s  Estimated RAP:  15.00 mmHg MR PISA:         1.01 cm    RVSP:           65.1 mmHg MR PISA Eff ROA: 7 mm MR PISA Radius:  0.40 cm     SHUNTS                              Systemic VTI:  0.19 m                              Systemic Diam: 1.90 cm Eleonore Chiquito MD Electronically signed by Eleonore Chiquito MD Signature Date/Time: 05/16/2021/5:25:59 PM    Final    VAS US CAROTID (at [facility] and [facility] only)  Result Date: 06/01/2021 Carotid Arterial Duplex Study Patient Name:  Heather Saunders  Date of Exam:   06/01/2021 Medical Rec #: [id number]         Accession #:    [id number] Date of Birth: [birth year]         Patient Gender: F Patient Age:   46 years Exam  Location:  [facility] Procedure:      VAS US CAROTID Referring Phys: Gean Birchwood --------------------------------------------------------------------------------  Indications:  CVA. Risk Factors: Hypertension, hyperlipidemia, coronary artery disease. Performing Technologist: Archie Patten RVS  Examination Guidelines: A complete evaluation includes B-mode imaging, spectral Doppler, color Doppler, and power Doppler as needed of all accessible portions of each vessel. Bilateral testing is considered an integral part of a complete examination. Limited examinations for reoccurring indications may be performed as noted.  Right Carotid Findings: +----------+--------+--------+--------+------------------+--------+           PSV cm/sEDV cm/sStenosisPlaque DescriptionComments +----------+--------+--------+--------+------------------+--------+ CCA Prox  30      8               heterogenous               +----------+--------+--------+--------+------------------+--------+ CCA Distal25      7               heterogenous               +----------+--------+--------+--------+------------------+--------+ ICA Prox  57      20      1-39%   heterogenous               +----------+--------+--------+--------+------------------+--------+ ICA Distal34      11                                         +----------+--------+--------+--------+------------------+--------+  ECA       52      6                                          +----------+--------+--------+--------+------------------+--------+ +----------+--------+-------+--------+-------------------+           PSV cm/sEDV cmsDescribeArm Pressure (mmHG) +----------+--------+-------+--------+-------------------+ [id number]                                         +----------+--------+-------+--------+-------------------+ +---------+--------+--+--------+-+---------+ VertebralPSV cm/s21EDV cm/s5Antegrade  +---------+--------+--+--------+-+---------+  Left Carotid Findings: +----------+--------+--------+--------+------------------+--------+           PSV cm/sEDV cm/sStenosisPlaque DescriptionComments +----------+--------+--------+--------+------------------+--------+ CCA Prox  31      7               heterogenous               +----------+--------+--------+--------+------------------+--------+ CCA Distal28      6               heterogenous               +----------+--------+--------+--------+------------------+--------+ ICA Prox  24      10      1-39%   heterogenous               +----------+--------+--------+--------+------------------+--------+ ICA Distal25      6                                          +----------+--------+--------+--------+------------------+--------+ ECA       44                                                 +----------+--------+--------+--------+------------------+--------+ +----------+--------+--------+--------+-------------------+           PSV cm/sEDV cm/sDescribeArm Pressure (mmHG) +----------+--------+--------+--------+-------------------+ [id number]                                          +----------+--------+--------+--------+-------------------+ +---------+--------+--+--------+-+---------+ VertebralPSV cm/s22EDV cm/s5Antegrade +---------+--------+--+--------+-+---------+   Summary: Right Carotid: Velocities in the right ICA are consistent with a 1-39% stenosis. Left Carotid: Velocities in the left ICA are consistent with a 1-39% stenosis. Vertebrals: Bilateral vertebral arteries demonstrate antegrade flow. *See table(s) above for measurements and observations.  Electronically signed by Antony Contras MD on 06/01/2021 at 5:17:25 PM.    Final     Microbiology: Recent Results (from the past 240 hour(s))  SARS CORONAVIRUS 2 (TAT 6-24 HRS) Nasopharyngeal Nasopharyngeal Swab     Status: None   Collection Time: 05/31/21  7:27  PM   Specimen: Nasopharyngeal Swab  Result Value Ref Range Status   SARS Coronavirus 2 NEGATIVE NEGATIVE Final    Comment: (NOTE) SARS-CoV-2 target nucleic acids are NOT DETECTED.  The SARS-CoV-2 RNA is generally detectable in upper and lower respiratory specimens during the acute phase of infection. Negative results do not preclude SARS-CoV-2 infection, do not rule out co-infections with other pathogens, and should not be used as the sole basis for treatment or other patient  management decisions. Negative results must be combined with clinical observations, patient history, and epidemiological information. The expected result is Negative.  Fact Sheet for Patients: SugarRoll.be  Fact Sheet for Healthcare Providers: https://www.woods-mathews.com/  This test is not yet approved or cleared by the [place] FDA and  has been authorized for detection and/or diagnosis of SARS-CoV-2 by FDA under an Emergency Use Authorization (EUA). This EUA will remain  in effect (meaning this test can be used) for the duration of the COVID-19 declaration under Se ction 564(b)(1) of the Act, 21 U.S.C. section 360bbb-3(b)(1), unless the authorization is terminated or revoked sooner.  Performed at [facility], [place] [address] [place], [place] [address]   Urine Culture     Status: None   Collection Time: 06/01/21 11:14 AM   Specimen: Urine, Catheterized  Result Value Ref Range Status   Specimen Description URINE, CATHETERIZED  Final   Special Requests NONE  Final   Culture   Final    NO GROWTH Performed at [facility], [address], [place], [place] [address]    Report Status 06/02/2021 FINAL  Final  SARS CORONAVIRUS 2 (TAT 6-24 HRS) Nasopharyngeal Nasopharyngeal Swab     Status: None   Collection Time: 06/08/21 11:34 AM   Specimen: Nasopharyngeal Swab  Result Value Ref Range Status   SARS Coronavirus 2 NEGATIVE NEGATIVE Final     Comment: (NOTE) SARS-CoV-2 target nucleic acids are NOT DETECTED.  The SARS-CoV-2 RNA is generally detectable in upper and lower respiratory specimens during the acute phase of infection. Negative results do not preclude SARS-CoV-2 infection, do not rule out co-infections with other pathogens, and should not be used as the sole basis for treatment or other patient management decisions. Negative results must be combined with clinical observations, patient history, and epidemiological information. The expected result is Negative.  Fact Sheet for Patients: SugarRoll.be  Fact Sheet for Healthcare Providers: https://www.woods-mathews.com/  This test is not yet approved or cleared by the [place] FDA and  has been authorized for detection and/or diagnosis of SARS-CoV-2 by FDA under an Emergency Use Authorization (EUA). This EUA will remain  in effect (meaning this test can be used) for the duration of the COVID-19 declaration under Se ction 564(b)(1) of the Act, 21 U.S.C. section 360bbb-3(b)(1), unless the authorization is terminated or revoked sooner.  Performed at [facility], [place] [address], [place], [place] [address]      Labs: Basic Metabolic Panel: Recent Labs  Lab 06/03/21 0049 06/04/21 1340 06/05/21 0253 06/06/21 0328  NA 137 137 136 138  K 3.8 4.3 4.5 4.1  CL 107 103 106 107  CO2 23 26 20* 24  GLUCOSE 131* 82 138* 96  BUN 28* 34* 38* 42*  CREATININE 1.91* 2.40* 2.36* 2.42*  CALCIUM 9.5 9.2 9.5 9.5   Liver Function Tests: No results for input(s): AST, ALT, ALKPHOS, BILITOT, PROT, ALBUMIN in the last 168 hours.  No results for input(s): LIPASE, AMYLASE in the last 168 hours. No results for input(s): AMMONIA in the last 168 hours. CBC: No results for input(s): WBC, NEUTROABS, HGB, HCT, MCV, PLT in the last 168 hours. Cardiac Enzymes: No results for input(s): CKTOTAL, CKMB, CKMBINDEX, TROPONINI in the last 168  hours. BNP: BNP (last 3 results) Recent Labs    03/18/21 0038  BNP 815.8*    ProBNP (last 3 results) No results for input(s): PROBNP in the last 8760 hours.  CBG: Recent Labs  Lab 06/02/21 1624  06/02/21 2000  GLUCAP 228* 180*       Signed:  Kayleen Memos, MD [facility] 06/09/2021, 2:30 PM

## 2021-06-10 ENCOUNTER — Inpatient Hospital Stay (HOSPITAL_COMMUNITY): Payer: Medicare Other

## 2021-06-10 DIAGNOSIS — Z515 Encounter for palliative care: Secondary | ICD-10-CM | POA: Diagnosis not present

## 2021-06-10 DIAGNOSIS — Z7189 Other specified counseling: Secondary | ICD-10-CM | POA: Diagnosis not present

## 2021-06-10 DIAGNOSIS — Z66 Do not resuscitate: Secondary | ICD-10-CM

## 2021-06-10 DIAGNOSIS — I63312 Cerebral infarction due to thrombosis of left middle cerebral artery: Secondary | ICD-10-CM | POA: Diagnosis not present

## 2021-06-10 LAB — CBC WITH DIFFERENTIAL/PLATELET
Abs Immature Granulocytes: 0.05 10*3/uL (ref 0.00–0.07)
Basophils Absolute: 0 10*3/uL (ref 0.0–0.1)
Basophils Relative: 0 %
Eosinophils Absolute: 0.1 10*3/uL (ref 0.0–0.5)
Eosinophils Relative: 1 %
HCT: 37.1 % (ref 36.0–46.0)
Hemoglobin: 12 g/dL (ref 12.0–15.0)
Immature Granulocytes: 0 %
Lymphocytes Relative: 33 %
Lymphs Abs: 4 10*3/uL (ref 0.7–4.0)
MCH: 28.4 pg (ref 26.0–34.0)
MCHC: 32.3 g/dL (ref 30.0–36.0)
MCV: 87.7 fL (ref 80.0–100.0)
Monocytes Absolute: 0.7 10*3/uL (ref 0.1–1.0)
Monocytes Relative: 6 %
Neutro Abs: 7.2 10*3/uL (ref 1.7–7.7)
Neutrophils Relative %: 60 %
Platelets: 243 10*3/uL (ref 150–400)
RBC: 4.23 MIL/uL (ref 3.87–5.11)
RDW: 15.3 % (ref 11.5–15.5)
WBC: 12.1 10*3/uL — ABNORMAL HIGH (ref 4.0–10.5)
nRBC: 0 % (ref 0.0–0.2)

## 2021-06-10 LAB — PROCALCITONIN: Procalcitonin: 0.32 ng/mL

## 2021-06-10 LAB — GLUCOSE, CAPILLARY: Glucose-Capillary: 132 mg/dL — ABNORMAL HIGH (ref 70–99)

## 2021-06-10 LAB — BRAIN NATRIURETIC PEPTIDE: B Natriuretic Peptide: 1535.1 pg/mL — ABNORMAL HIGH (ref 0.0–100.0)

## 2021-06-10 MED ORDER — HYDRALAZINE HCL 25 MG PO TABS: 25.0000 mg | ORAL_TABLET | Freq: Three times a day (TID) | ORAL | 2 refills | Status: DC

## 2021-06-10 MED ORDER — ASPIRIN 81 MG PO CHEW: 81.0000 mg | CHEWABLE_TABLET | Freq: Every day | ORAL | 2 refills | Status: DC

## 2021-06-10 MED ORDER — FUROSEMIDE 20 MG PO TABS
20.0000 mg | ORAL_TABLET | Freq: Every day | ORAL | Status: DC
  Administered 2021-06-10 – 2021-06-11 (×2): 20 mg via ORAL
  Filled 2021-06-10 (×2): qty 1

## 2021-06-10 MED ORDER — FUROSEMIDE 20 MG PO TABS: 20.0000 mg | ORAL_TABLET | Freq: Every day | ORAL | 0 refills | Status: DC

## 2021-06-10 MED ORDER — HYDRALAZINE HCL 25 MG PO TABS
25.0000 mg | ORAL_TABLET | Freq: Three times a day (TID) | ORAL | Status: DC
  Administered 2021-06-10 – 2021-06-11 (×3): 25 mg via ORAL
  Filled 2021-06-10 (×4): qty 1

## 2021-06-10 MED ORDER — FOSFOMYCIN TROMETHAMINE 3 G PO PACK
3.0000 g | PACK | Freq: Once | ORAL | Status: AC
  Administered 2021-06-10: 3 g via ORAL
  Filled 2021-06-10: qty 3

## 2021-06-10 MED ORDER — FUROSEMIDE 10 MG/ML IJ SOLN
40.0000 mg | Freq: Once | INTRAMUSCULAR | Status: AC
  Administered 2021-06-10: 40 mg via INTRAVENOUS
  Filled 2021-06-10: qty 4

## 2021-06-10 MED ORDER — DILTIAZEM HCL 60 MG PO TABS
60.0000 mg | ORAL_TABLET | Freq: Three times a day (TID) | ORAL | Status: DC
  Administered 2021-06-10 – 2021-06-13 (×9): 60 mg via ORAL
  Filled 2021-06-10 (×10): qty 1

## 2021-06-10 MED ORDER — ASPIRIN 81 MG PO CHEW
81.0000 mg | CHEWABLE_TABLET | Freq: Every day | ORAL | Status: DC
  Administered 2021-06-11 – 2021-06-14 (×4): 81 mg via ORAL
  Filled 2021-06-10 (×4): qty 1

## 2021-06-10 NOTE — Progress Notes (Addendum)
Discharge Summary  Heather Saunders [id number] DOB: [birth year]  PCP: Kathyrn Lass, MD  Admit date: 05/31/2021 Discharge date: 06/10/2021  Time spent: 35 minutes  Recommendations for Outpatient Follow-up:  Follow-up with neurology Follow-up with cardiology Follow-up with your primary care provider Take your medications as prescribed Continue PT OT with assistance and fall precautions  Discharge Diagnoses:  Active Hospital Problems   Diagnosis Date Noted   CVA (cerebral vascular accident) ([place]) 06/01/2021   Protein-calorie malnutrition, severe 06/08/2021   CKD (chronic kidney disease), stage IV (HCC)    Chronic diastolic congestive heart failure ([place])    Acute encephalopathy 05/31/2021   Atrial fibrillation ([place]) 05/31/2021   Pulmonary HTN ([place])    Essential hypertension 08/03/2018    Resolved Hospital Problems  No resolved problems to display.    Discharge Condition: Stable  Diet recommendation: Resume previous diet.  Vitals:   06/10/21 1102 06/10/21 1233  BP: (!) 187/118 (!) 177/127  Pulse: 80 89  Resp: (!) 22 16  Temp:  97.7 F (36.5 C)  SpO2: 92% 97%    History of present illness:  Patient 87 year old female history of A. fib on Eliquis, pulmonary hypertension, chronic kidney disease stage IV, anemia, presented to the ED with confusion as noted by home health aide with some associated slurred speech.  Work-up in the ED with head CT being unremarkable, MRI head consistent with acute CVA.  Patient admitted for stroke work-up.  Hospital course complicated by generalized weakness for which PT OT recommended SNF placement.  Uncontrolled hypertension, home Cardizem dose was increased and she was started on p.o. hydralazine 10 mg 3 times daily.   06/09/21: She was seen and examined at bedside.  Lethargic this morning.  Obtain a chest x-ray and showed pulmonary edema.  1 dose of IV Lasix given.    Hospital Course:  Principal Problem:   CVA (cerebral vascular  accident) [facility]) Active Problems:   Essential hypertension   Pulmonary HTN ([place])   Acute encephalopathy   Atrial fibrillation (HCC)   CKD (chronic kidney disease), stage IV (HCC)   Chronic diastolic congestive heart failure (HCC)   Protein-calorie malnutrition, severe  Acute left caudate nucleus CVA/acute metabolic encephalopathy -Patient presented with confusion and slurred speech. -Head CT done on admission negative for any acute abnormalities. -MRI brain/MRA head done consistent with small acute infarct of the left caudate nucleus, no hemorrhage or mass-effect.  Normal intracranial MRA. -Per neurology patient with left caudate head infarct likely secondary to small vessel disease source in the setting of A. fib and on chronic anticoagulation.  -2D echo done with normal EF, no source of emboli. -Carotid Dopplers done with no significant ICA stenosis. -Patient noted to have significant lethargy on 06/01/2021 after patient noted to have received IV Ativan the evening of 05/31/2021 prior to MRI.  -Patient more alert today, following simple commands. -Repeat head CT done with no hemorrhagic conversion of acute CVA and no new infarct. -Patient noted to have been started on aspirin on admission.  Neurology consultation which was done. -Patient seen by stroke team who recommended resumption of Eliquis 2.5 mg twice daily and addition of aspirin 81 mg daily for further stroke prophylaxis per stroke MD. -PT/OT/SLP. -Needs SNF placement.  TOC consulted. -Stroke team was following but have signed off.   -Outpatient follow-up with neurology.    Acute metabolic encephalopathy, suspect secondary to acute illness Chest x-ray revealed pulmonary edema, 1 dose of IV Lasix given 1 dose of fosfomycin, patient is unable to tell  whether or not she has any symptoms of UTI due to confusion. Reorient as needed  Pulmonary edema, suspect cardiogenic Resume home diuretics Last 2D echo done on 06/01/2021 showed LVEF  55 to 60% with grade 1 diastolic dysfunction. Strict I's and O's and daily weight Net INO +1.2 L  Hypertension uncontrolled -BP not at goal, elevated Increased her home dose of Cardizem to 180 mg daily Continue home Lopressor 100 mg twice daily.    Increased dose added p.o. hydralazine 25 mg 3 times daily Continue to monitor vital signs.  History of A. fib -Resume home regimen Cardizem and increase Lopressor back to home dose 100 mg twice daily for rate control.   -Eliquis for anticoagulation.    Chronic kidney disease stage IV -Stable.  History of pulmonary hypertension -Increase Lopressor back to home dose of 100 mg twice daily.  Restart home Lasix -Resume home regimen Cardizem.   -Continue Zetia, statin, aspirin.    History of right atrial thrombus -2D echo done 2 weeks prior to admission with no signs of thrombus noted. -Repeat 2D echo done with EF of 55 to 60%, NWMA, mildly reduced right ventricular systolic function, right ventricular size normal, mildly elevated pulmonary artery systolic pressure, mild to moderate TVR. -No thrombus noted on 2D echo -We will resume home regimen Cardizem.   -Increase metoprolol back to home dose of 100 mg twice daily.   -Continue Eliquis.   Generalized weakness/ambulatory dysfunction OT recommending SNF CSW is working on SNF placement. Awaiting insurance authorization.  Goals of care Palliative Care team consulted Continue palliative care outpatient DNR on 06/10/21       DVT prophylaxis: Eliquis Code Status: Full Family Communication: No family at bedside.  Disposition:    Status is: Inpatient   The patient will require care spanning > 2 midnights and should be moved to inpatient because: Inpatient level of care appropriate due to severity of illness   Dispo: The patient is from: Home              Anticipated d/c is to: SNF versus CIR              Patient currently is currently stable medically for discharge.               Difficult to place patient No           Consultants:  Neurology: Dr.Khaliqdina 05/31/2021   Procedures: CT head 06/01/2021, 05/31/2021 MRI brain 05/31/2021 MRA head 05/31/2021 2D echo 06/01/2021 Carotid Dopplers 06/01/2021     Antimicrobials:  None  Discharge Exam: BP (!) 177/127 (BP Location: Left Arm)   Pulse 89   Temp 97.7 F (36.5 C) (Oral)   Resp 16   Ht 5' 3.5" (1.613 m)   Wt 49.4 kg   SpO2 97%   BMI 19.01 kg/m  General: 87 y.o. year-old female lethargic this morning.  Frail-appearing. Cardiovascular: Regular rate and rhythm no rubs or gallops.   Respiratory: Mild rales at bases with mild audible wheezing noted.   Abdomen: Soft nontender normal bowel sounds present. Musculoskeletal: No lower extremity edema bilaterally.   Skin: No ulcerative lesions or rashes. Psychiatry: Mood is appropriate for condition and setting.    Discharge Instructions You were cared for by a hospitalist during your hospital stay. If you have any questions about your discharge medications or the care you received while you were in the hospital after you are discharged, you can call the unit and asked to speak with the hospitalist  on call if the hospitalist that took care of you is not available. Once you are discharged, your primary care physician will handle any further medical issues. Please note that NO REFILLS for any discharge medications will be authorized once you are discharged, as it is imperative that you return to your primary care physician (or establish a relationship with a primary care physician if you do not have one) for your aftercare needs so that they can reassess your need for medications and monitor your lab values.  Discharge Instructions     Ambulatory referral to Neurology   Complete by: As directed    Follow up with stroke clinic NP (Jessica Vanschaick or Cecille Rubin, if both not available, consider Zachery Dauer, or [place]) at [facility] in about 4 weeks. Thanks.       Allergies as of 06/10/2021       Reactions   Boniva [ibandronic Acid]    Sore throat   Fosamax [alendronate Sodium]    Sore throat        Medication List     STOP taking these medications    sodium bicarbonate 650 MG tablet       TAKE these medications    acetaminophen 500 MG tablet Commonly known as: TYLENOL Take 500 mg by mouth every 6 (six) hours as needed for mild pain.   apixaban 2.5 MG Tabs tablet Commonly known as: ELIQUIS Take 1 tablet (2.5 mg total) by mouth 2 (two) times daily.   aspirin 81 MG chewable tablet Chew 1 tablet (81 mg total) by mouth daily. Start taking on: June 11, 2021   atorvastatin 40 MG tablet Commonly known as: LIPITOR Take 40 mg by mouth daily.   diltiazem 180 MG 24 hr capsule Commonly known as: CARDIZEM CD Take 1 capsule (180 mg total) by mouth daily. What changed:  medication strength how much to take   ezetimibe 10 MG tablet Commonly known as: ZETIA TAKE 1 TABLET BY MOUTH EVERY DAY   feeding supplement Liqd Take 237 mLs by mouth 2 (two) times daily between meals for 7 days.   furosemide 20 MG tablet Commonly known as: Lasix Take 1 tablet (20 mg total) by mouth daily.   hydrALAZINE 25 MG tablet Commonly known as: APRESOLINE Take 1 tablet (25 mg total) by mouth every 8 (eight) hours.   metoprolol tartrate 100 MG tablet Commonly known as: LOPRESSOR Take 1 tablet (100 mg total) by mouth 2 (two) times daily.   multivitamin capsule Take 1 capsule by mouth daily.   polyethylene glycol 17 g packet Commonly known as: MIRALAX / GLYCOLAX Take 17 g by mouth daily as needed for mild constipation.   Vitamin D3 50 MCG (2000 UT) capsule Take 2,000 Units by mouth daily.               Durable Medical Equipment  (From admission, onward)           Start     Ordered   06/08/21 1156  For home use only DME 3 n 1  Once        06/08/21 1156   06/08/21 1156  For home use only DME standard manual wheelchair with  seat cushion  Once       Comments: Patient suffers from ambulatory dysfunction which impairs their ability to perform daily activities like walking in the home.  A cane will not resolve issue with performing activities of daily living. A wheelchair will allow patient to safely perform daily activities.  Patient can safely propel the wheelchair in the home or has a caregiver who can provide assistance. Length of need 6 months. Accessories: elevating leg rests, wheel locks, extensions and anti-tippers.   06/08/21 1156   06/08/21 1155  For home use only DME Walker rolling  Once       Question Answer Comment  Walker: With 5 Inch Wheels   Patient needs a walker to treat with the following condition Ambulatory dysfunction      06/08/21 1156           Allergies  Allergen Reactions   Boniva [Ibandronic Acid]     Sore throat   Fosamax [Alendronate Sodium]     Sore throat    Follow-up Information     [facility]. Schedule an appointment as soon as possible for a visit in 1 month(s).   Specialty: Neurology Why: stroke clinic Contact information: [place] [phone]        Kathyrn Lass, MD. Call today.   Specialty: Family Medicine Why: Please call for a posthospital follow-up Contact information: [place] [address] [phone]         Fay Records, MD .   Specialty: Cardiology Contact information: [place] [address] [phone]                  The results of significant diagnostics from this hospitalization (including imaging, microbiology, ancillary and laboratory) are listed below for reference.    Significant Diagnostic Studies: CT HEAD WO CONTRAST (5MM)  Result Date: 06/01/2021 CLINICAL DATA:  Neuro deficit EXAM: CT HEAD WITHOUT CONTRAST TECHNIQUE: Contiguous axial images were obtained from the base of the skull through the  vertex without intravenous contrast. COMPARISON:  CT head 05/31/2021 FINDINGS: Brain: There is hypodensity centered in the left caudate body extending towards the lentiform nucleus consistent with evolving subacute infarct as seen on the prior MRI. There is no evidence of hemorrhagic conversion. There is no new infarct. There is no evidence of acute intracranial hemorrhage or extra-axial fluid collection. Mild parenchymal volume loss is unchanged. Remote infarcts in the bilateral basal ganglia are unchanged. There is no mass lesion. There is no midline shift. The ventricles are stable in size. Vascular: No hyperdense vessel or unexpected calcification. Skull: Normal. Negative for fracture or focal lesion. Sinuses/Orbits: The paranasal sinuses are clear. Bilateral lens implants are in place. The globes and orbits are otherwise unremarkable. Other: None. IMPRESSION: 1. Evolving subacute infarct in the left caudate body without evidence of hemorrhagic transformation. 2. No new infarct or hemorrhage. Electronically Signed   By: Valetta Mole M.D.   On: 06/01/2021 14:37   CT HEAD WO CONTRAST  Result Date: 05/31/2021 CLINICAL DATA:  Altered mental status. EXAM: CT HEAD WITHOUT CONTRAST TECHNIQUE: Contiguous axial images were obtained from the base of the skull through the vertex without intravenous contrast. COMPARISON:  None. FINDINGS: Brain: Mild age-related atrophy and chronic microvascular ischemic changes. There is no acute intracranial hemorrhage. No mass effect midline shift. No extra-axial fluid collection. Vascular: No hyperdense vessel or unexpected calcification. Skull: Normal. Negative for fracture or focal lesion. Sinuses/Orbits: No acute finding. Other: None IMPRESSION: 1. No acute intracranial pathology. 2. Mild age-related atrophy and chronic microvascular ischemic changes. Electronically Signed   By: Anner Crete M.D.   On: 05/31/2021 19:19   MR ANGIO HEAD WO CONTRAST  Result Date:  05/31/2021 CLINICAL DATA:  Acute neurologic  deficit EXAM: MRI HEAD WITHOUT CONTRAST MRA HEAD WITHOUT CONTRAST TECHNIQUE: Multiplanar, multi-echo pulse sequences of the brain and surrounding structures were acquired without intravenous contrast. Angiographic images of the Circle of Willis were acquired using MRA technique without intravenous contrast. COMPARISON:  No pertinent prior exam. 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 No acute or chronic hemorrhage. There is multifocal hyperintense T2-weighted signal within the white matter. Generalized volume loss without a clear lobar predilection. The midline structures are normal. Vascular: Major flow voids are preserved. Skull and upper cervical spine: Normal calvarium and skull base. Visualized upper cervical spine and soft tissues are normal. Sinuses/Orbits:No paranasal sinus fluid levels or advanced mucosal thickening. No mastoid or middle ear effusion. Normal orbits. MRA HEAD FINDINGS POSTERIOR CIRCULATION: --Vertebral arteries: Normal --Inferior cerebellar arteries: Normal. --Basilar artery: Normal. --Superior cerebellar arteries: Normal. --Posterior cerebral arteries: Normal. ANTERIOR CIRCULATION: --Intracranial internal carotid arteries: Normal. --Anterior cerebral arteries (ACA): Normal. --Middle cerebral arteries (MCA): Normal. ANATOMIC VARIANTS: Fetal origin of both posterior cerebral arteries. IMPRESSION: 1. 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 No hemorrhage or mass effect. 2. Normal intracranial MRA. Electronically Signed   By: Ulyses Jarred M.D.   On: 05/31/2021 22:26   MR BRAIN WO CONTRAST  Result Date: 05/31/2021 CLINICAL DATA:  Acute neurologic deficit EXAM: MRI HEAD WITHOUT CONTRAST MRA HEAD WITHOUT CONTRAST TECHNIQUE: Multiplanar, multi-echo pulse sequences of the brain and surrounding structures were acquired without intravenous contrast. Angiographic images of the Circle of Willis were  acquired using MRA technique without intravenous contrast. COMPARISON:  No pertinent prior exam. 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 No acute or chronic hemorrhage. There is multifocal hyperintense T2-weighted signal within the white matter. Generalized volume loss without a clear lobar predilection. The midline structures are normal. Vascular: Major flow voids are preserved. Skull and upper cervical spine: Normal calvarium and skull base. Visualized upper cervical spine and soft tissues are normal. Sinuses/Orbits:No paranasal sinus fluid levels or advanced mucosal thickening. No mastoid or middle ear effusion. Normal orbits. MRA HEAD FINDINGS POSTERIOR CIRCULATION: --Vertebral arteries: Normal --Inferior cerebellar arteries: Normal. --Basilar artery: Normal. --Superior cerebellar arteries: Normal. --Posterior cerebral arteries: Normal. ANTERIOR CIRCULATION: --Intracranial internal carotid arteries: Normal. --Anterior cerebral arteries (ACA): Normal. --Middle cerebral arteries (MCA): Normal. ANATOMIC VARIANTS: Fetal origin of both posterior cerebral arteries. IMPRESSION: 1. 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 No hemorrhage or mass effect. 2. Normal intracranial MRA. Electronically Signed   By: Ulyses Jarred M.D.   On: 05/31/2021 22:26   DG CHEST PORT 1 VIEW  Result Date: 06/10/2021 CLINICAL DATA:  Shortness of breath EXAM: PORTABLE CHEST 1 VIEW COMPARISON:  March 22, 2021 FINDINGS: The mediastinal contour is stable. The heart size enlarged. Increased pulmonary interstitium is identified bilaterally unchanged. Patchy consolidation of bilateral lung bases with bilateral pleural effusions unchanged. Bony structures are stable. IMPRESSION: 1. Congestive heart failure. 2. Patchy consolidation of bilateral lung bases with bilateral pleural effusions unchanged. Electronically Signed   By: Abelardo Diesel M.D.   On: 06/10/2021 09:54   ECHOCARDIOGRAM  COMPLETE  Result Date: 06/01/2021    ECHOCARDIOGRAM REPORT   Patient Name:   RHILEE SHEAN Date of Exam: 06/01/2021 Medical Rec #:  [id number]        Height:       63.5 in Accession #:    [id number]       Weight:       109.0 lb Date of  Birth:  September 26, 1925        BSA:          1.503 m Patient Age:    7 years         BP:           158/87 mmHg Patient Gender: F                HR:           70 bpm. Exam Location:  Inpatient Procedure: 2D Echo, Cardiac Doppler and Color Doppler Indications:    Stroke I63.9  History:        Patient has prior history of Echocardiogram examinations, most                 recent 05/16/2021. Previous Myocardial Infarction; Risk                 Factors:Dyslipidemia and Hypertension.  Sonographer:    Bernadene Person RDCS Referring Phys: [place]  1. Left ventricular ejection fraction, by estimation, is 55 to 60%. The left ventricle has normal function. The left ventricle has no regional wall motion abnormalities. Left ventricular diastolic parameters are consistent with Grade I diastolic dysfunction (impaired relaxation).  2. Right ventricular systolic function is mildly reduced. The right ventricular size is normal. There is mildly elevated pulmonary artery systolic pressure.  3. The mitral valve is grossly normal. No evidence of mitral valve regurgitation.  4. The aortic valve is tricuspid. Aortic valve regurgitation is trivial. Aortic valve sclerosis is present, with no evidence of aortic valve stenosis.  5. The inferior vena cava is dilated in size with >50% respiratory variability, suggesting right atrial pressure of 8 mmHg.  6. Tricuspid valve regurgitation is mild to moderate. Comparison(s): A prior study was performed on 05/16/21. Prior RVSP 65 mm Hg. FINDINGS  Left Ventricle: Left ventricular ejection fraction, by estimation, is 55 to 60%. The left ventricle has normal function. The left ventricle has no regional wall motion abnormalities. The left ventricular  internal cavity size was small. There is no left ventricular hypertrophy. Left ventricular diastolic parameters are consistent with Grade I diastolic dysfunction (impaired relaxation). Right Ventricle: The right ventricular size is normal. No increase in right ventricular wall thickness. Right ventricular systolic function is mildly reduced. There is mildly elevated pulmonary artery systolic pressure. The tricuspid regurgitant velocity  is 2.78 m/s, and with an assumed right atrial pressure of 8 mmHg, the estimated right ventricular systolic pressure is [id number] mmHg. Left Atrium: Left atrial size was normal in size. Right Atrium: Right atrial size was normal in size. Pericardium: Trivial pericardial effusion is present. Mitral Valve: The mitral valve is grossly normal. No evidence of mitral valve regurgitation. Tricuspid Valve: The tricuspid valve is normal in structure. Tricuspid valve regurgitation is mild to moderate. Aortic Valve: The aortic valve is tricuspid. There is mild calcification of the aortic valve. There is mild thickening of the aortic valve. Aortic valve regurgitation is trivial. Aortic regurgitation PHT measures 932 msec. Mild aortic valve sclerosis is present, with no evidence of aortic valve stenosis. Pulmonic Valve: The pulmonic valve was not well visualized. Pulmonic valve regurgitation is mild to moderate. No evidence of pulmonic stenosis. Aorta: The aortic root and ascending aorta are structurally normal, with no evidence of dilitation. Venous: The inferior vena cava is dilated in size with greater than 50% respiratory variability, suggesting right atrial pressure of 8 mmHg. IAS/Shunts: The atrial septum is grossly normal.  LEFT VENTRICLE PLAX  2D LVIDd:         3.10 cm  Diastology LVIDs:         2.20 cm  LV e' medial:    3.09 cm/s LV PW:         1.10 cm  LV E/e' medial:  11.4 LV IVS:        0.90 cm  LV e' lateral:   3.32 cm/s LVOT diam:     2.00 cm  LV E/e' lateral: 10.6 LV SV:         54 LV SV  Index:   36 LVOT Area:     3.14 cm  RIGHT VENTRICLE RV S prime:     6.98 cm/s TAPSE (M-mode): 1.3 cm LEFT ATRIUM             Index       RIGHT ATRIUM           Index LA diam:        3.20 cm 2.13 cm/m  RA Area:     11.20 cm LA Vol (A2C):   38.8 ml 25.82 ml/m RA Volume:   25.30 ml  16.84 ml/m LA Vol (A4C):   38.7 ml 25.75 ml/m LA Biplane Vol: 39.5 ml 26.28 ml/m  AORTIC VALVE             PULMONIC VALVE LVOT Vmax:   88.93 cm/s  PR End Diast Vel: 9.61 msec LVOT Vmean:  60.933 cm/s LVOT VTI:    0.171 m AI PHT:      932 msec  AORTA Ao Root diam: 3.10 cm Ao Asc diam:  3.30 cm MITRAL VALVE               TRICUSPID VALVE MV Area (PHT): 3.72 cm    TR Peak grad:   30.9 mmHg MV Decel Time: 204 msec    TR Vmax:        278.00 cm/s MV E velocity: 35.20 cm/s MV A velocity: 84.20 cm/s  SHUNTS MV E/A ratio:  0.42        Systemic VTI:  0.17 m                            Systemic Diam: 2.00 cm Rudean Haskell MD Electronically signed by Rudean Haskell MD Signature Date/Time: 06/01/2021/12:16:58 PM    Final    ECHOCARDIOGRAM COMPLETE  Result Date: 05/16/2021    ECHOCARDIOGRAM REPORT   Patient Name:   VERNONICA VITTITOW Date of Exam: 05/16/2021 Medical Rec #:  [id number]        Height:       63.5 in Accession #:    [id number]       Weight:       109.0 lb Date of Birth:  1925/03/28        BSA:          1.503 m Patient Age:    40 years         BP:           150/88 mmHg Patient Gender: F                HR:           96 bpm. Exam Location:  [address] Procedure: 2D Echo, Cardiac Doppler and Color Doppler Indications:    I51.89 Right atrial mass  History:        Patient has prior history of Echocardiogram examinations, most  recent 03/19/2021. CHF, CAD and Previous Myocardial Infarction,                 Arrythmias:Atrial Fibrillation; Risk Factors:Hypertension and                 Dyslipidemia. CKD stage 4. Right atrial mass.  Sonographer:    Basilia Jumbo BS, RDCS Referring Phys: [place]   1. Prominent eustachian valve seen in the RA. No evidence of thrombus seen on prior TEE. Would consider repeat TEE to definitively exclude.  2. Left ventricular ejection fraction, by estimation, is 55 to 60%. The left ventricle has normal function. The left ventricle has no regional wall motion abnormalities. There is moderate concentric left ventricular hypertrophy. Left ventricular diastolic function could not be evaluated.  3. Right ventricular systolic function is low normal. The right ventricular size is normal. There is moderately elevated pulmonary artery systolic pressure. The estimated right ventricular systolic pressure is 0000000 mmHg.  4. Left atrial size was mildly dilated.  5. A small pericardial effusion is present. The pericardial effusion is circumferential.  6. The mitral valve is grossly normal. Mild to moderate mitral valve regurgitation.  7. Tricuspid valve regurgitation is moderate.  8. The aortic valve is tricuspid. There is mild calcification of the aortic valve. There is mild thickening of the aortic valve. Aortic valve regurgitation is trivial. Mild aortic valve sclerosis is present, with no evidence of aortic valve stenosis.  9. The inferior vena cava is dilated in size with <50% respiratory variability, suggesting right atrial pressure of 15 mmHg. Comparison(s): 03/19/21 EF 60-65%. PA pressure 27mHg. FINDINGS  Left Ventricle: Left ventricular ejection fraction, by estimation, is 55 to 60%. The left ventricle has normal function. The left ventricle has no regional wall motion abnormalities. The left ventricular internal cavity size was small. There is moderate  concentric left ventricular hypertrophy. Left ventricular diastolic function could not be evaluated due to atrial fibrillation. Left ventricular diastolic function could not be evaluated. Right Ventricle: The right ventricular size is normal. No increase in right ventricular wall thickness. Right ventricular systolic function is low  normal. There is moderately elevated pulmonary artery systolic pressure. The tricuspid regurgitant velocity  is 3.54 m/s, and with an assumed right atrial pressure of 15 mmHg, the estimated right ventricular systolic pressure is 60000000mmHg. Left Atrium: Left atrial size was mildly dilated. Right Atrium: Right atrial size was normal in size. Prominent Eustachian valve. Pericardium: A small pericardial effusion is present. The pericardial effusion is circumferential. Mitral Valve: The mitral valve is grossly normal. Mild to moderate mitral valve regurgitation. Tricuspid Valve: The tricuspid valve is grossly normal. Tricuspid valve regurgitation is moderate . No evidence of tricuspid stenosis. Aortic Valve: The aortic valve is tricuspid. There is mild calcification of the aortic valve. There is mild thickening of the aortic valve. Aortic valve regurgitation is trivial. Aortic regurgitation PHT measures 503 msec. Mild aortic valve sclerosis is present, with no evidence of aortic valve stenosis. Pulmonic Valve: The pulmonic valve was grossly normal. Pulmonic valve regurgitation is mild. No evidence of pulmonic stenosis. Aorta: The aortic root and ascending aorta are structurally normal, with no evidence of dilitation. Venous: The inferior vena cava is dilated in size with less than 50% respiratory variability, suggesting right atrial pressure of 15 mmHg. IAS/Shunts: The atrial septum is grossly normal.  LEFT VENTRICLE PLAX 2D LVIDd:         3.00 cm LVIDs:  1.90 cm LV PW:         1.60 cm LV IVS:        1.50 cm LVOT diam:     1.90 cm LV SV:         53 LV SV Index:   35 LVOT Area:     2.84 cm  RIGHT VENTRICLE RV Basal diam:  3.10 cm RV S prime:     7.71 cm/s TAPSE (M-mode): 1.1 cm RVSP:           65.1 mmHg LEFT ATRIUM             Index       RIGHT ATRIUM            Index LA diam:        3.70 cm 2.46 cm/m  RA Pressure: 15.00 mmHg LA Vol (A2C):   62.6 ml 41.66 ml/m RA Area:     13.40 cm LA Vol (A4C):   51.3 ml 34.14  ml/m RA Volume:   34.20 ml   22.76 ml/m LA Biplane Vol: 59.4 ml 39.53 ml/m  AORTIC VALVE LVOT Vmax:   81.70 cm/s LVOT Vmean:  59.600 cm/s LVOT VTI:    0.187 m AI PHT:      503 msec  AORTA Ao Root diam: 3.40 cm Ao Asc diam:  3.40 cm MR Peak grad:    165.9 mmHg  TRICUSPID VALVE MR Mean grad:    105.0 mmHg  TR Peak grad:   50.1 mmHg MR Vmax:         644.00 cm/s TR Vmax:        354.00 cm/s MR Vmean:        493.0 cm/s  Estimated RAP:  15.00 mmHg MR PISA:         1.01 cm    RVSP:           65.1 mmHg MR PISA Eff ROA: 7 mm MR PISA Radius:  0.40 cm     SHUNTS                              Systemic VTI:  0.19 m                              Systemic Diam: 1.90 cm Eleonore Chiquito MD Electronically signed by Eleonore Chiquito MD Signature Date/Time: 05/16/2021/5:25:59 PM    Final    VAS US CAROTID (at [facility] and [facility] only)  Result Date: 06/01/2021 Carotid Arterial Duplex Study Patient Name:  TAIMANE RIBEIRO  Date of Exam:   06/01/2021 Medical Rec #: [id number]         Accession #:    [id number] Date of Birth: [birth year]         Patient Gender: F Patient Age:   76 years Exam Location:  [facility] Procedure:      VAS US CAROTID Referring Phys: Gean Birchwood --------------------------------------------------------------------------------  Indications:  CVA. Risk Factors: Hypertension, hyperlipidemia, coronary artery disease. Performing Technologist: Archie Patten RVS  Examination Guidelines: A complete evaluation includes B-mode imaging, spectral Doppler, color Doppler, and power Doppler as needed of all accessible portions of each vessel. Bilateral testing is considered an integral part of a complete examination. Limited examinations for reoccurring indications may be performed as noted.  Right Carotid Findings: +----------+--------+--------+--------+------------------+--------+           PSV cm/sEDV cm/sStenosisPlaque DescriptionComments +----------+--------+--------+--------+------------------+--------+  CCA Prox   30      8               heterogenous               +----------+--------+--------+--------+------------------+--------+ CCA Distal25      7               heterogenous               +----------+--------+--------+--------+------------------+--------+ ICA Prox  57      20      1-39%   heterogenous               +----------+--------+--------+--------+------------------+--------+ ICA Distal34      11                                         +----------+--------+--------+--------+------------------+--------+ ECA       52      6                                          +----------+--------+--------+--------+------------------+--------+ +----------+--------+-------+--------+-------------------+           PSV cm/sEDV cmsDescribeArm Pressure (mmHG) +----------+--------+-------+--------+-------------------+ [id number]                                         +----------+--------+-------+--------+-------------------+ +---------+--------+--+--------+-+---------+ VertebralPSV cm/s21EDV cm/s5Antegrade +---------+--------+--+--------+-+---------+  Left Carotid Findings: +----------+--------+--------+--------+------------------+--------+           PSV cm/sEDV cm/sStenosisPlaque DescriptionComments +----------+--------+--------+--------+------------------+--------+ CCA Prox  31      7               heterogenous               +----------+--------+--------+--------+------------------+--------+ CCA Distal28      6               heterogenous               +----------+--------+--------+--------+------------------+--------+ ICA Prox  24      10      1-39%   heterogenous               +----------+--------+--------+--------+------------------+--------+ ICA Distal25      6                                          +----------+--------+--------+--------+------------------+--------+ ECA       44                                                  +----------+--------+--------+--------+------------------+--------+ +----------+--------+--------+--------+-------------------+           PSV cm/sEDV cm/sDescribeArm Pressure (mmHG) +----------+--------+--------+--------+-------------------+ [id number]                                          +----------+--------+--------+--------+-------------------+ +---------+--------+--+--------+-+---------+ VertebralPSV cm/s22EDV cm/s5Antegrade +---------+--------+--+--------+-+---------+   Summary: Right Carotid: Velocities in the right ICA are consistent with  a 1-39% stenosis. Left Carotid: Velocities in the left ICA are consistent with a 1-39% stenosis. Vertebrals: Bilateral vertebral arteries demonstrate antegrade flow. *See table(s) above for measurements and observations.  Electronically signed by Antony Contras MD on 06/01/2021 at 5:17:25 PM.    Final     Microbiology: Recent Results (from the past 240 hour(s))  SARS CORONAVIRUS 2 (TAT 6-24 HRS) Nasopharyngeal Nasopharyngeal Swab     Status: None   Collection Time: 05/31/21  7:27 PM   Specimen: Nasopharyngeal Swab  Result Value Ref Range Status   SARS Coronavirus 2 NEGATIVE NEGATIVE Final    Comment: (NOTE) SARS-CoV-2 target nucleic acids are NOT DETECTED.  The SARS-CoV-2 RNA is generally detectable in upper and lower respiratory specimens during the acute phase of infection. Negative results do not preclude SARS-CoV-2 infection, do not rule out co-infections with other pathogens, and should not be used as the sole basis for treatment or other patient management decisions. Negative results must be combined with clinical observations, patient history, and epidemiological information. The expected result is Negative.  Fact Sheet for Patients: SugarRoll.be  Fact Sheet for Healthcare Providers: https://www.woods-mathews.com/  This test is not yet approved or cleared by the [place] FDA  and  has been authorized for detection and/or diagnosis of SARS-CoV-2 by FDA under an Emergency Use Authorization (EUA). This EUA will remain  in effect (meaning this test can be used) for the duration of the COVID-19 declaration under Se ction 564(b)(1) of the Act, 21 U.S.C. section 360bbb-3(b)(1), unless the authorization is terminated or revoked sooner.  Performed at [facility], [place] [address] [place], [place] [address]   Urine Culture     Status: None   Collection Time: 06/01/21 11:14 AM   Specimen: Urine, Catheterized  Result Value Ref Range Status   Specimen Description URINE, CATHETERIZED  Final   Special Requests NONE  Final   Culture   Final    NO GROWTH Performed at [facility], [address], [place], [place] [address]    Report Status 06/02/2021 FINAL  Final  SARS CORONAVIRUS 2 (TAT 6-24 HRS) Nasopharyngeal Nasopharyngeal Swab     Status: None   Collection Time: 06/08/21 11:34 AM   Specimen: Nasopharyngeal Swab  Result Value Ref Range Status   SARS Coronavirus 2 NEGATIVE NEGATIVE Final    Comment: (NOTE) SARS-CoV-2 target nucleic acids are NOT DETECTED.  The SARS-CoV-2 RNA is generally detectable in upper and lower respiratory specimens during the acute phase of infection. Negative results do not preclude SARS-CoV-2 infection, do not rule out co-infections with other pathogens, and should not be used as the sole basis for treatment or other patient management decisions. Negative results must be combined with clinical observations, patient history, and epidemiological information. The expected result is Negative.  Fact Sheet for Patients: SugarRoll.be  Fact Sheet for Healthcare Providers: https://www.woods-mathews.com/  This test is not yet approved or cleared by the [place] FDA and  has been authorized for detection and/or diagnosis of SARS-CoV-2 by FDA under an Emergency Use Authorization (EUA).  This EUA will remain  in effect (meaning this test can be used) for the duration of the COVID-19 declaration under Se ction 564(b)(1) of the Act, 21 U.S.C. section 360bbb-3(b)(1), unless the authorization is terminated or revoked sooner.  Performed at [facility], [place] [address], [place], [place] [address]      Labs: Basic Metabolic Panel: Recent Labs  Lab 06/04/21 1340 06/05/21 0253 06/06/21 0328  NA  137 136 138  K 4.3 4.5 4.1  CL 103 106 107  CO2 26 20* 24  GLUCOSE 82 138* 96  BUN 34* 38* 42*  CREATININE 2.40* 2.36* 2.42*  CALCIUM 9.2 9.5 9.5   Liver Function Tests: No results for input(s): AST, ALT, ALKPHOS, BILITOT, PROT, ALBUMIN in the last 168 hours.  No results for input(s): LIPASE, AMYLASE in the last 168 hours. No results for input(s): AMMONIA in the last 168 hours. CBC: Recent Labs  Lab 06/10/21 1123  WBC 12.1*  NEUTROABS 7.2  HGB 12.0  HCT 37.1  MCV 87.7  PLT 243   Cardiac Enzymes: No results for input(s): CKTOTAL, CKMB, CKMBINDEX, TROPONINI in the last 168 hours. BNP: BNP (last 3 results) Recent Labs    03/18/21 0038 06/10/21 1123  BNP 815.8* 1,535.1*    ProBNP (last 3 results) No results for input(s): PROBNP in the last 8760 hours.  CBG: Recent Labs  Lab 06/10/21 0821  GLUCAP 132*       Signed:  Kayleen Memos, MD [facility] 06/10/2021, 4:45 PM

## 2021-06-10 NOTE — Consult Note (Signed)
Palliative Medicine Inpatient Consult Note  Consulting Provider: Cindee Salt, RN  Reason for consult:   [place] Palliative Medicine Consult  Reason for Consult? family goals of care    HPI:  Per intake H&P --> Patient 87 year old female history of A. fib on Eliquis, pulmonary hypertension, chronic kidney disease stage IV, anemia, presented to the ED with confusion as noted by home health aide with some associated slurred speech.  Work-up in the ED with head CT being unremarkable, MRI head consistent with acute CVA.  Patient admitted for stroke work-up.  Hospital course complicated by generalized weakness for which PT OT recommended SNF placement.    Palliative care was asked to get involved to discuss GOC in the setting of an acute stroke, multiple co-morbidites, and a prolonged hospitalization.   Clinical Assessment/Goals of Care:  *Please note that this is a verbal dictation therefore any spelling or grammatical errors are due to the "[place]" system interpretation.  I have reviewed medical records including EPIC notes, labs and imaging, received report from bedside RN, assessed the patient who is sitting her her recliner. She is disoriented to person place and time.    I called patient son, nephew Verlin Grills to further discuss diagnosis prognosis, GOC, EOL wishes, disposition and options.  We reviewed Benjamine Mola "Big Betty's" health state. We reviewed that Sirina has a h/o Afib, CKD IV, and a new CVA. We discussed that in older patients with less reserve recovery from even a less significant stroke can be trial-some.   I introduced Palliative Medicine as specialized medical care for people living with serious illness. It focuses on providing relief from the symptoms and stress of a serious illness. The goal is to improve quality of life for both the patient and the family.  Naleyah is from [place], [place]. She has lived here  throughout her life. She is a widow as her spouse passed away in 83. 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 She was a very active volunteer in her church and is of the [employer].   Prior to June when Paulette was admitted for Afib with RVR she was fully independent and able to self prepare all meals. She was still driving locally then as well. She was noted to have declined after that hospitalization and her nephews were noted to check in on her more frequently and help with pill preparation. She was improving and shared that she didn't need their support as frequently prior to her rehospitalization. Takeyia was fiercly independent.   A detailed discussion was had today regarding advanced directives - Dajanae appoint her nephew Shanon Brow as her financial POA though never designated a POA therefore decisions are shared among her nephews and nieces. Patient most involved family members are her nephews Shanon Brow and Rosanne Gutting.     Concepts specific to code status, artifical feeding and hydration, continued IV antibiotics and rehospitalization was had.  We reviewed patients frail state and her unlikely return to her present level of function if she endured a cardiopulmonary arrest. Shanon Brow shares that he and his brother do not think she would with to be kept alive artificially and are in agreement with a DNAR/DNI code status.  The difference between a aggressive medical intervention path  and a palliative comfort care path for this patient at this time was had. We reviewed the best case and worst case scenarios in Lokelani's case. I shared that she will  now likely need 24/7 supportive care from her on out. Shanon Brow states that he plans to talk to the MSW team at [facility] once she transitions to review her eligibility for medicaid.   We discussed that is Heavenleigh neglects to improve moving forward further conversations in relation to hospice care should take place.   I described hospice as a service for patients for have a life expectancy of < 6 months. It preserves dignity and quality at the end phases of life. The focus changes from curative to symptom relief. Presently, we reviewed a Palliative provider following along to support Sophi's needs and continue [place] conversations.  Discussed the importance of continued conversation with family and their  medical providers regarding overall plan of care and treatment options, ensuring decisions are within the context of the patients values and GOCs.  Decision Maker: Verlin Grills (nephew) [phone]  SUMMARY OF RECOMMENDATIONS   DNAR/DNI  DNR form will be place on chart  Plan for rehabilitation for strengthening  Delirium precautions implemented  Chaplain Support  TOC - OP Palliative support  PMT will continue to follow incrementally while patient is in house  Code Status/Advance Care Planning: DNAR/DNI   Palliative Prophylaxis:  Oral Care, Mobility, Delirium preventions  Additional Recommendations (Limitations, Scope, Preferences): Treat what is treatable   Psycho-social/Spiritual:  Desire for further Chaplaincy support: Yes Additional Recommendations: Education on post-stroke expectations   Prognosis: Unclear - prolonged hospitalization, (+) 2 hospitalizations in 6 months, severe frailty,   Discharge Planning: Discharge to [place] with OP Palliative care  Vitals:   06/10/21 1102 06/10/21 1233  BP: (!) 187/118 (!) 177/127  Pulse: 80 89  Resp: (!) 22 16  Temp:  97.7 F (36.5 C)  SpO2: 92% 97%    Intake/Output Summary (Last 24 hours) at 06/10/2021 1517 Last data filed at 06/10/2021 1303 Gross per 24 hour  Intake 600 ml  Output --  Net 600 ml   Last Weight  Most recent update: 06/01/2021 12:07 AM    Weight  49.4 kg (109 lb)            Gen:  Frail elderly F in NAD HEENT: Dry mucous membranes CV: Regular rate and rhythm  PULM: On RA ABD: soft/nontender  EXT: No  edema Neuro: Alert to self  PPS: 30%   This conversation/these recommendations were discussed with patient primary care team, Dr. [place] Crane  Time In: 1400 Time Out: 1510 Total Time: 70 Greater than 50%  of this time was spent counseling and coordinating care related to the above assessment and plan.  [place] Team Team Cell Phone: [phone] Please utilize secure chat with additional questions, if there is no response within 30 minutes please call the above phone number  Palliative Medicine Team providers are available by phone from 7am to 7pm daily and can be reached through the team cell phone.  Should this patient require assistance outside of these hours, please call the patient's attending physician.

## 2021-06-11 DIAGNOSIS — I10 Essential (primary) hypertension: Secondary | ICD-10-CM

## 2021-06-11 DIAGNOSIS — N184 Chronic kidney disease, stage 4 (severe): Secondary | ICD-10-CM

## 2021-06-11 DIAGNOSIS — I4819 Other persistent atrial fibrillation: Secondary | ICD-10-CM | POA: Diagnosis not present

## 2021-06-11 DIAGNOSIS — L8990 Pressure ulcer of unspecified site, unspecified stage: Secondary | ICD-10-CM | POA: Insufficient documentation

## 2021-06-11 DIAGNOSIS — I5032 Chronic diastolic (congestive) heart failure: Secondary | ICD-10-CM | POA: Diagnosis not present

## 2021-06-11 DIAGNOSIS — I63312 Cerebral infarction due to thrombosis of left middle cerebral artery: Secondary | ICD-10-CM | POA: Diagnosis not present

## 2021-06-11 LAB — BASIC METABOLIC PANEL
Anion gap: 17 — ABNORMAL HIGH (ref 5–15)
Anion gap: 12 (ref 5–15)
BUN: 83 mg/dL — ABNORMAL HIGH (ref 8–23)
BUN: 91 mg/dL — ABNORMAL HIGH (ref 8–23)
CO2: 21 mmol/L — ABNORMAL LOW (ref 22–32)
CO2: 22 mmol/L (ref 22–32)
Calcium: 9.1 mg/dL (ref 8.9–10.3)
Calcium: 9.1 mg/dL (ref 8.9–10.3)
Chloride: 101 mmol/L (ref 98–111)
Chloride: 103 mmol/L (ref 98–111)
Creatinine, Ser: 4.04 mg/dL — ABNORMAL HIGH (ref 0.44–1.00)
Creatinine, Ser: 4.54 mg/dL — ABNORMAL HIGH (ref 0.44–1.00)
GFR, Estimated: 10 mL/min — ABNORMAL LOW (ref >60)
GFR, Estimated: 8 mL/min — ABNORMAL LOW (ref >60)
Glucose, Bld: 199 mg/dL — ABNORMAL HIGH (ref 70–99)
Glucose, Bld: 228 mg/dL — ABNORMAL HIGH (ref 70–99)
Potassium: 4.5 mmol/L (ref 3.5–5.1)
Potassium: 4.6 mmol/L (ref 3.5–5.1)
Sodium: 139 mmol/L (ref 135–145)
Sodium: 137 mmol/L (ref 135–145)

## 2021-06-11 MED ORDER — BISACODYL 10 MG RE SUPP: 10.0000 mg | Freq: Every day | RECTAL | Status: DC | PRN

## 2021-06-11 MED ORDER — FUROSEMIDE 10 MG/ML IJ SOLN
40.0000 mg | Freq: Once | INTRAMUSCULAR | Status: AC
  Administered 2021-06-11: 40 mg via INTRAVENOUS
  Filled 2021-06-11: qty 4

## 2021-06-11 MED ORDER — HYDRALAZINE HCL 50 MG PO TABS
50.0000 mg | ORAL_TABLET | Freq: Three times a day (TID) | ORAL | Status: DC
  Administered 2021-06-11 (×2): 50 mg via ORAL
  Filled 2021-06-11 (×3): qty 1

## 2021-06-11 MED ORDER — SENNOSIDES-DOCUSATE SODIUM 8.6-50 MG PO TABS
2.0000 | ORAL_TABLET | Freq: Two times a day (BID) | ORAL | Status: DC
  Administered 2021-06-11 – 2021-06-14 (×7): 2 via ORAL
  Filled 2021-06-11 (×7): qty 2

## 2021-06-11 MED ORDER — HYDRALAZINE HCL 50 MG PO TABS: 50.0000 mg | ORAL_TABLET | Freq: Three times a day (TID) | ORAL | 0 refills | Status: DC

## 2021-06-11 MED ORDER — ALBUMIN HUMAN 25 % IV SOLN
25.0000 g | Freq: Once | INTRAVENOUS | Status: AC
  Administered 2021-06-11: 25 g via INTRAVENOUS
  Filled 2021-06-11: qty 100

## 2021-06-11 NOTE — TOC Progression Note (Signed)
Transition of Care [facility]) - Progression Note    Patient Details  Name: Heather Saunders MRN: [id number] Date of Birth: [birth year]  [facility] CM/SW Contact  Emeterio Reeve, [place] Phone Number: 06/11/2021, 2:28 PM  Clinical Narrative:     Pts insurance is not managed by [employer]. Facility had to start insurance auth. Toc will continue to follow.  Expected Discharge Plan: Home/Self Care Barriers to Discharge: Other (must enter comment)  Expected Discharge Plan and Services Expected Discharge Plan: Home/Self Care     Post Acute Care Choice: NA   Expected Discharge Date: 06/08/21                                     Social Determinants of Health (SDOH) Interventions    Readmission Risk Interventions No flowsheet data found.  Emeterio Reeve, LCSW Clinical Social Worker

## 2021-06-11 NOTE — Progress Notes (Addendum)
Progress note  Heather Saunders [id number] DOB: 1925-07-15  PCP: Kathyrn Lass, MD  Recommendations for Outpatient Follow-up:  Follow-up with neurology Follow-up with cardiology Follow-up with your primary care provider Take your medications as prescribed Continue PT OT with assistance and fall precautions  Discharge Diagnoses:  Active Hospital Problems   Diagnosis Date Noted   CVA (cerebral vascular accident) ([place]) 06/01/2021   Pressure injury of skin 06/11/2021   Protein-calorie malnutrition, severe 06/08/2021   CKD (chronic kidney disease), stage IV (HCC)    Chronic diastolic congestive heart failure ([place])    Acute encephalopathy 05/31/2021   Atrial fibrillation ([place]) 05/31/2021   Pulmonary HTN ([place])    Essential hypertension 08/03/2018    Resolved Hospital Problems  No resolved problems to display.     Vitals:   06/11/21 0623 06/11/21 1201  BP: (!) 180/93 (!) 157/70  Pulse: 67 (!) 58  Resp: 19 20  Temp: 97.6 F (36.4 C) (!) 97.3 F (36.3 C)  SpO2: 94% 98%    History of present illness:  Patient 87 year old female history of A. fib on Eliquis, pulmonary hypertension, chronic kidney disease stage IV, anemia, presented to the ED with confusion as noted by home health aide with some associated slurred speech.  Work-up in the ED with head CT being unremarkable, MRI head consistent with acute CVA.  Patient admitted for stroke work-up.  Hospital course complicated by generalized weakness for which PT OT recommended SNF placement.  Uncontrolled hypertension, home Cardizem dose was increased and p.o. hydralazine 50 mg 3 times daily added.   06/11/21: Seen in her room, sitting in the chair.  Alert and pleasantly confused.  Hospital Course:  Principal Problem:   CVA (cerebral vascular accident) [facility]) Active Problems:   Essential hypertension   Pulmonary HTN ([place])   Acute encephalopathy   Atrial fibrillation (HCC)   CKD (chronic kidney disease), stage IV (HCC)    Chronic diastolic congestive heart failure (HCC)   Protein-calorie malnutrition, severe   Pressure injury of skin  Acute left caudate nucleus CVA/acute metabolic encephalopathy -Patient presented with confusion and slurred speech. -Head CT done on admission negative for any acute abnormalities. -MRI brain/MRA head done consistent with small acute infarct of the left caudate nucleus, no hemorrhage or mass-effect.  Normal intracranial MRA. -Per neurology patient with left caudate head infarct likely secondary to small vessel disease source in the setting of A. fib and on chronic anticoagulation.  -2D echo done with normal EF, no source of emboli. -Carotid Dopplers done with no significant ICA stenosis. -Patient noted to have significant lethargy on 06/01/2021 after patient noted to have received IV Ativan the evening of 05/31/2021 prior to MRI.  -Patient more alert today, following simple commands. -Repeat head CT done with no hemorrhagic conversion of acute CVA and no new infarct. -Patient noted to have been started on aspirin on admission.  Neurology consultation which was done. -Patient seen by stroke team who recommended resumption of Eliquis 2.5 mg twice daily and addition of aspirin 81 mg daily for further stroke prophylaxis per stroke MD. -PT/OT/SLP. -Needs SNF placement.  TOC consulted. -Stroke team was following but have signed off.   -Outpatient follow-up with neurology.    Acute metabolic encephalopathy, suspect secondary to acute illness Chest x-ray 06/10/2021 revealed pulmonary edema, received 1 dose of IV Lasix due to increased work of breathing.  Home p.o. Lasix was restarted 20 mg daily.  20-Jun-2021 received another dose of IV Lasix 40 mg x 1 and home p.o. dose  of Lasix 20 mg daily, prior to results of BMP which showed uptrending creatinine 4.04 from 2.42 on 06/06/2021. UA and urine culture ordered, pending.  AKI on CKD 4 Baseline creatinine 1.8 with GFR of 25 Lasix  discontinued Avoid nephrotoxic agents Closely monitor urine output Nephrology Dr. Posey Pronto consulted.  Acute on chronic diastolic CHF BNP elevated greater than 1500 Right JVD on exam Last 2D echo on 06/01/2021 showed normal LVEF 55 to 60%, left ventricle had no regional wall motion abnormalities, grade 1 diastolic dysfunction Continue strict I's and O's and daily weight Cardiology consulted  Pulmonary edema, suspect cardiogenic Resume home diuretics Last 2D echo done on 06/01/2021 showed LVEF 55 to 60% with grade 1 diastolic dysfunction. Strict I's and O's and daily weight And I&O +1.6 L  Hypertension uncontrolled -BP not at goal, elevated Continue increased dose of home Cardizem to 180 mg daily Continue home Lopressor 100 mg twice daily.    Increased dose added p.o. hydralazine 50 mg 3 times daily Continue to monitor vital signs.  Paroxysmal A. fib, rate is controlled -Resume home regimen Cardizem and increase Lopressor back to home dose 100 mg twice daily for rate control.   -Eliquis for anticoagulation.    Chronic kidney disease stage IV -Stable.  History of pulmonary hypertension Continue to hold off home Lasix due to AKI  History of right atrial thrombus -2D echo done 2 weeks prior to admission with no signs of thrombus noted. -Repeat 2D echo done with EF of 55 to 60%, NWMA, mildly reduced right ventricular systolic function, right ventricular size normal, mildly elevated pulmonary artery systolic pressure, mild to moderate TVR. -No thrombus noted on 2D echo -Continue Eliquis.   Generalized weakness/ambulatory dysfunction OT recommending SNF CSW is working on SNF placement. Awaiting insurance authorization.  Constipation Senokot 2 tablets twice daily MiraLAX daily as needed  Goals of care Palliative Care team consulted Continue palliative care outpatient DNR on 06/10/21       DVT prophylaxis: Eliquis Code Status: DNR. Family Communication: Updated her nephew  via phone. Disposition:    Status is: Inpatient   The patient will require care spanning > 2 midnights and should be moved to inpatient because: Inpatient level of care appropriate due to severity of illness   Dispo: The patient is from: Home              Anticipated d/c is to: SNF.              Patient currently is currently not stable for discharge due to AKI and acute CHF.              Difficult to place patient No           Consultants:  Neurology: Dr.Khaliqdina 05/31/2021   Procedures: CT head 06/01/2021, 05/31/2021 MRI brain 05/31/2021 MRA head 05/31/2021 2D echo 06/01/2021 Carotid Dopplers 06/01/2021     Antimicrobials:  None  Discharge Exam: BP (!) 157/70 (BP Location: Left Arm)   Pulse (!) 58   Temp (!) 97.3 F (36.3 C) (Oral)   Resp 20   Ht 5' 3.5" (1.613 m)   Wt 49.6 kg   SpO2 98%   BMI 19.07 kg/m  General: 87 y.o. year-old female frail-appearing in no acute distress.  She is alert and pleasantly confused. Cardiovascular: Regular rate and rhythm no rubs or gallops.  Right JVD. Respiratory: Mild rales at bases with no wheezing noted. Abdomen: Soft nontender normal bowel sounds present. Musculoskeletal: No extremity edema bilaterally. Skin:  No ulcerative lesions noted. Psychiatry: Mood is appropriate for condition and setting.  Discharge Instructions You were cared for by a hospitalist during your hospital stay. If you have any questions about your discharge medications or the care you received while you were in the hospital after you are discharged, you can call the unit and asked to speak with the hospitalist on call if the hospitalist that took care of you is not available. Once you are discharged, your primary care physician will handle any further medical issues. Please note that NO REFILLS for any discharge medications will be authorized once you are discharged, as it is imperative that you return to your primary care physician (or establish a relationship with a  primary care physician if you do not have one) for your aftercare needs so that they can reassess your need for medications and monitor your lab values.  Discharge Instructions     Ambulatory referral to Neurology   Complete by: As directed    Follow up with stroke clinic NP (Jessica Vanschaick or Cecille Rubin, if both not available, consider Zachery Dauer, or [place]) at [facility] in about 4 weeks. Thanks.      Allergies as of 06/11/2021       Reactions   Boniva [ibandronic Acid]    Sore throat   Fosamax [alendronate Sodium]    Sore throat        Medication List     STOP taking these medications    furosemide 20 MG tablet Commonly known as: Lasix   sodium bicarbonate 650 MG tablet       TAKE these medications    acetaminophen 500 MG tablet Commonly known as: TYLENOL Take 500 mg by mouth every 6 (six) hours as needed for mild pain.   apixaban 2.5 MG Tabs tablet Commonly known as: ELIQUIS Take 1 tablet (2.5 mg total) by mouth 2 (two) times daily.   aspirin 81 MG chewable tablet Chew 1 tablet (81 mg total) by mouth daily.   atorvastatin 40 MG tablet Commonly known as: LIPITOR Take 40 mg by mouth daily.   diltiazem 180 MG 24 hr capsule Commonly known as: CARDIZEM CD Take 1 capsule (180 mg total) by mouth daily. What changed:  medication strength how much to take   ezetimibe 10 MG tablet Commonly known as: ZETIA TAKE 1 TABLET BY MOUTH EVERY DAY   feeding supplement Liqd Take 237 mLs by mouth 2 (two) times daily between meals for 7 days.   hydrALAZINE 50 MG tablet Commonly known as: APRESOLINE Take 1 tablet (50 mg total) by mouth every 8 (eight) hours.   metoprolol tartrate 100 MG tablet Commonly known as: LOPRESSOR Take 1 tablet (100 mg total) by mouth 2 (two) times daily.   multivitamin capsule Take 1 capsule by mouth daily.   polyethylene glycol 17 g packet Commonly known as: MIRALAX / GLYCOLAX Take 17 g by mouth daily as needed for mild  constipation.   Vitamin D3 50 MCG (2000 UT) capsule Take 2,000 Units by mouth daily.               Durable Medical Equipment  (From admission, onward)           Start     Ordered   06/08/21 1156  For home use only DME 3 n 1  Once        06/08/21 1156   06/08/21 1156  For home use only DME standard manual wheelchair with seat cushion  Once  Comments: Patient suffers from ambulatory dysfunction which impairs their ability to perform daily activities like walking in the home.  A cane will not resolve issue with performing activities of daily living. A wheelchair will allow patient to safely perform daily activities. Patient can safely propel the wheelchair in the home or has a caregiver who can provide assistance. Length of need 6 months. Accessories: elevating leg rests, wheel locks, extensions and anti-tippers.   06/08/21 1156   06/08/21 1155  For home use only DME Walker rolling  Once       Question Answer Comment  Walker: With 5 Inch Wheels   Patient needs a walker to treat with the following condition Ambulatory dysfunction      06/08/21 1156           Allergies  Allergen Reactions   Boniva [Ibandronic Acid]     Sore throat   Fosamax [Alendronate Sodium]     Sore throat    Follow-up Information     [facility]. Schedule an appointment as soon as possible for a visit in 1 month(s).   Specialty: Neurology Why: stroke clinic Contact information: [place] [phone]        Kathyrn Lass, MD. Call today.   Specialty: Family Medicine Why: Please call for a posthospital follow-up Contact information: [place] [address] [phone]         Fay Records, MD .   Specialty: Cardiology Contact information: [place] [address] [phone]                  The results of significant diagnostics from this  hospitalization (including imaging, microbiology, ancillary and laboratory) are listed below for reference.    Significant Diagnostic Studies: CT HEAD WO CONTRAST (5MM)  Result Date: 06/01/2021 CLINICAL DATA:  Neuro deficit EXAM: CT HEAD WITHOUT CONTRAST TECHNIQUE: Contiguous axial images were obtained from the base of the skull through the vertex without intravenous contrast. COMPARISON:  CT head 05/31/2021 FINDINGS: Brain: There is hypodensity centered in the left caudate body extending towards the lentiform nucleus consistent with evolving subacute infarct as seen on the prior MRI. There is no evidence of hemorrhagic conversion. There is no new infarct. There is no evidence of acute intracranial hemorrhage or extra-axial fluid collection. Mild parenchymal volume loss is unchanged. Remote infarcts in the bilateral basal ganglia are unchanged. There is no mass lesion. There is no midline shift. The ventricles are stable in size. Vascular: No hyperdense vessel or unexpected calcification. Skull: Normal. Negative for fracture or focal lesion. Sinuses/Orbits: The paranasal sinuses are clear. Bilateral lens implants are in place. The globes and orbits are otherwise unremarkable. Other: None. IMPRESSION: 1. Evolving subacute infarct in the left caudate body without evidence of hemorrhagic transformation. 2. No new infarct or hemorrhage. Electronically Signed   By: Valetta Mole M.D.   On: 06/01/2021 14:37   CT HEAD WO CONTRAST  Result Date: 05/31/2021 CLINICAL DATA:  Altered mental status. EXAM: CT HEAD WITHOUT CONTRAST TECHNIQUE: Contiguous axial images were obtained from the base of the skull through the vertex without intravenous contrast. COMPARISON:  None. FINDINGS: Brain: Mild age-related atrophy and chronic microvascular ischemic changes. There is no acute intracranial hemorrhage. No mass effect midline shift. No extra-axial fluid collection. Vascular: No hyperdense vessel or unexpected calcification.  Skull: Normal. Negative for fracture or focal lesion. Sinuses/Orbits: No acute finding. Other: None IMPRESSION: 1. No  acute intracranial pathology. 2. Mild age-related atrophy and chronic microvascular ischemic changes. Electronically Signed   By: Anner Crete M.D.   On: 05/31/2021 19:19   MR ANGIO HEAD WO CONTRAST  Result Date: 05/31/2021 CLINICAL DATA:  Acute neurologic deficit EXAM: MRI HEAD WITHOUT CONTRAST MRA HEAD WITHOUT CONTRAST TECHNIQUE: Multiplanar, multi-echo pulse sequences of the brain and surrounding structures were acquired without intravenous contrast. Angiographic images of the Circle of Willis were acquired using MRA technique without intravenous contrast. COMPARISON:  No pertinent prior exam. 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 No acute or chronic hemorrhage. There is multifocal hyperintense T2-weighted signal within the white matter. Generalized volume loss without a clear lobar predilection. The midline structures are normal. Vascular: Major flow voids are preserved. Skull and upper cervical spine: Normal calvarium and skull base. Visualized upper cervical spine and soft tissues are normal. Sinuses/Orbits:No paranasal sinus fluid levels or advanced mucosal thickening. No mastoid or middle ear effusion. Normal orbits. MRA HEAD FINDINGS POSTERIOR CIRCULATION: --Vertebral arteries: Normal --Inferior cerebellar arteries: Normal. --Basilar artery: Normal. --Superior cerebellar arteries: Normal. --Posterior cerebral arteries: Normal. ANTERIOR CIRCULATION: --Intracranial internal carotid arteries: Normal. --Anterior cerebral arteries (ACA): Normal. --Middle cerebral arteries (MCA): Normal. ANATOMIC VARIANTS: Fetal origin of both posterior cerebral arteries. IMPRESSION: 1. 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 No hemorrhage or mass effect. 2. Normal intracranial MRA. Electronically Signed   By: Ulyses Jarred M.D.   On: 05/31/2021 22:26    MR BRAIN WO CONTRAST  Result Date: 05/31/2021 CLINICAL DATA:  Acute neurologic deficit EXAM: MRI HEAD WITHOUT CONTRAST MRA HEAD WITHOUT CONTRAST TECHNIQUE: Multiplanar, multi-echo pulse sequences of the brain and surrounding structures were acquired without intravenous contrast. Angiographic images of the Circle of Willis were acquired using MRA technique without intravenous contrast. COMPARISON:  No pertinent prior exam. 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 No acute or chronic hemorrhage. There is multifocal hyperintense T2-weighted signal within the white matter. Generalized volume loss without a clear lobar predilection. The midline structures are normal. Vascular: Major flow voids are preserved. Skull and upper cervical spine: Normal calvarium and skull base. Visualized upper cervical spine and soft tissues are normal. Sinuses/Orbits:No paranasal sinus fluid levels or advanced mucosal thickening. No mastoid or middle ear effusion. Normal orbits. MRA HEAD FINDINGS POSTERIOR CIRCULATION: --Vertebral arteries: Normal --Inferior cerebellar arteries: Normal. --Basilar artery: Normal. --Superior cerebellar arteries: Normal. --Posterior cerebral arteries: Normal. ANTERIOR CIRCULATION: --Intracranial internal carotid arteries: Normal. --Anterior cerebral arteries (ACA): Normal. --Middle cerebral arteries (MCA): Normal. ANATOMIC VARIANTS: Fetal origin of both posterior cerebral arteries. IMPRESSION: 1. 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 No hemorrhage or mass effect. 2. Normal intracranial MRA. Electronically Signed   By: Ulyses Jarred M.D.   On: 05/31/2021 22:26   DG CHEST PORT 1 VIEW  Result Date: 06/10/2021 CLINICAL DATA:  Shortness of breath EXAM: PORTABLE CHEST 1 VIEW COMPARISON:  March 22, 2021 FINDINGS: The mediastinal contour is stable. The heart size enlarged. Increased pulmonary interstitium is identified bilaterally unchanged. Patchy  consolidation of bilateral lung bases with bilateral pleural effusions unchanged. Bony structures are stable. IMPRESSION: 1. Congestive heart failure. 2. Patchy consolidation of bilateral lung bases with bilateral pleural effusions unchanged. Electronically Signed   By: Abelardo Diesel M.D.   On: 06/10/2021 09:54   ECHOCARDIOGRAM COMPLETE  Result Date: 06/01/2021    ECHOCARDIOGRAM REPORT   Patient Name:   TILLY JEANNOTTE Date of Exam: 06/01/2021 Medical Rec #:  [id number]        Height:       63.5 in Accession #:    [id number]       Weight:       109.0 lb Date of Birth:  [birth year]        BSA:          1.503 m Patient Age:    36 years         BP:           158/87 mmHg Patient Gender: F                HR:           70 bpm. Exam Location:  Inpatient Procedure: 2D Echo, Cardiac Doppler and Color Doppler Indications:    Stroke I63.9  History:        Patient has prior history of Echocardiogram examinations, most                 recent 05/16/2021. Previous Myocardial Infarction; Risk                 Factors:Dyslipidemia and Hypertension.  Sonographer:    Bernadene Person RDCS Referring Phys: [place]  1. Left ventricular ejection fraction, by estimation, is 55 to 60%. The left ventricle has normal function. The left ventricle has no regional wall motion abnormalities. Left ventricular diastolic parameters are consistent with Grade I diastolic dysfunction (impaired relaxation).  2. Right ventricular systolic function is mildly reduced. The right ventricular size is normal. There is mildly elevated pulmonary artery systolic pressure.  3. The mitral valve is grossly normal. No evidence of mitral valve regurgitation.  4. The aortic valve is tricuspid. Aortic valve regurgitation is trivial. Aortic valve sclerosis is present, with no evidence of aortic valve stenosis.  5. The inferior vena cava is dilated in size with >50% respiratory variability, suggesting right atrial pressure of 8 mmHg.  6. Tricuspid  valve regurgitation is mild to moderate. Comparison(s): A prior study was performed on 05/16/21. Prior RVSP 65 mm Hg. FINDINGS  Left Ventricle: Left ventricular ejection fraction, by estimation, is 55 to 60%. The left ventricle has normal function. The left ventricle has no regional wall motion abnormalities. The left ventricular internal cavity size was small. There is no left ventricular hypertrophy. Left ventricular diastolic parameters are consistent with Grade I diastolic dysfunction (impaired relaxation). Right Ventricle: The right ventricular size is normal. No increase in right ventricular wall thickness. Right ventricular systolic function is mildly reduced. There is mildly elevated pulmonary artery systolic pressure. The tricuspid regurgitant velocity  is 2.78 m/s, and with an assumed right atrial pressure of 8 mmHg, the estimated right ventricular systolic pressure is [id number] mmHg. Left Atrium: Left atrial size was normal in size. Right Atrium: Right atrial size was normal in size. Pericardium: Trivial pericardial effusion is present. Mitral Valve: The mitral valve is grossly normal. No evidence of mitral valve regurgitation. Tricuspid Valve: The tricuspid valve is normal in structure. Tricuspid valve regurgitation is mild to moderate. Aortic Valve: The aortic valve is tricuspid. There is mild calcification of the aortic valve. There is mild thickening of the aortic valve. Aortic valve regurgitation is trivial. Aortic regurgitation PHT measures 932 msec. Mild aortic valve sclerosis is present, with no evidence of aortic valve stenosis. Pulmonic Valve: The pulmonic valve was not well visualized. Pulmonic valve regurgitation is mild to moderate. No evidence of pulmonic stenosis. Aorta: The aortic root and ascending aorta are  structurally normal, with no evidence of dilitation. Venous: The inferior vena cava is dilated in size with greater than 50% respiratory variability, suggesting right atrial pressure of 8  mmHg. IAS/Shunts: The atrial septum is grossly normal.  LEFT VENTRICLE PLAX 2D LVIDd:         3.10 cm  Diastology LVIDs:         2.20 cm  LV e' medial:    3.09 cm/s LV PW:         1.10 cm  LV E/e' medial:  11.4 LV IVS:        0.90 cm  LV e' lateral:   3.32 cm/s LVOT diam:     2.00 cm  LV E/e' lateral: 10.6 LV SV:         54 LV SV Index:   36 LVOT Area:     3.14 cm  RIGHT VENTRICLE RV S prime:     6.98 cm/s TAPSE (M-mode): 1.3 cm LEFT ATRIUM             Index       RIGHT ATRIUM           Index LA diam:        3.20 cm 2.13 cm/m  RA Area:     11.20 cm LA Vol (A2C):   38.8 ml 25.82 ml/m RA Volume:   25.30 ml  16.84 ml/m LA Vol (A4C):   38.7 ml 25.75 ml/m LA Biplane Vol: 39.5 ml 26.28 ml/m  AORTIC VALVE             PULMONIC VALVE LVOT Vmax:   88.93 cm/s  PR End Diast Vel: 9.61 msec LVOT Vmean:  60.933 cm/s LVOT VTI:    0.171 m AI PHT:      932 msec  AORTA Ao Root diam: 3.10 cm Ao Asc diam:  3.30 cm MITRAL VALVE               TRICUSPID VALVE MV Area (PHT): 3.72 cm    TR Peak grad:   30.9 mmHg MV Decel Time: 204 msec    TR Vmax:        278.00 cm/s MV E velocity: 35.20 cm/s MV A velocity: 84.20 cm/s  SHUNTS MV E/A ratio:  0.42        Systemic VTI:  0.17 m                            Systemic Diam: 2.00 cm Rudean Haskell MD Electronically signed by Rudean Haskell MD Signature Date/Time: 06/01/2021/12:16:58 PM    Final    ECHOCARDIOGRAM COMPLETE  Result Date: 05/16/2021    ECHOCARDIOGRAM REPORT   Patient Name:   ALLEYNE RUDGE Date of Exam: 05/16/2021 Medical Rec #:  [id number]        Height:       63.5 in Accession #:    [id number]       Weight:       109.0 lb Date of Birth:  1925/06/22        BSA:          1.503 m Patient Age:    13 years         BP:           150/88 mmHg Patient Gender: F                HR:           96 bpm.  Exam Location:  [place] Procedure: 2D Echo, Cardiac Doppler and Color Doppler Indications:    I51.89 Right atrial mass  History:        Patient has prior history of  Echocardiogram examinations, most                 recent 03/19/2021. CHF, CAD and Previous Myocardial Infarction,                 Arrythmias:Atrial Fibrillation; Risk Factors:Hypertension and                 Dyslipidemia. CKD stage 4. Right atrial mass.  Sonographer:    Basilia Jumbo BS, RDCS Referring Phys: [place]  1. Prominent eustachian valve seen in the RA. No evidence of thrombus seen on prior TEE. Would consider repeat TEE to definitively exclude.  2. Left ventricular ejection fraction, by estimation, is 55 to 60%. The left ventricle has normal function. The left ventricle has no regional wall motion abnormalities. There is moderate concentric left ventricular hypertrophy. Left ventricular diastolic function could not be evaluated.  3. Right ventricular systolic function is low normal. The right ventricular size is normal. There is moderately elevated pulmonary artery systolic pressure. The estimated right ventricular systolic pressure is 0000000 mmHg.  4. Left atrial size was mildly dilated.  5. A small pericardial effusion is present. The pericardial effusion is circumferential.  6. The mitral valve is grossly normal. Mild to moderate mitral valve regurgitation.  7. Tricuspid valve regurgitation is moderate.  8. The aortic valve is tricuspid. There is mild calcification of the aortic valve. There is mild thickening of the aortic valve. Aortic valve regurgitation is trivial. Mild aortic valve sclerosis is present, with no evidence of aortic valve stenosis.  9. The inferior vena cava is dilated in size with <50% respiratory variability, suggesting right atrial pressure of 15 mmHg. Comparison(s): 03/19/21 EF 60-65%. PA pressure 32mHg. FINDINGS  Left Ventricle: Left ventricular ejection fraction, by estimation, is 55 to 60%. The left ventricle has normal function. The left ventricle has no regional wall motion abnormalities. The left ventricular internal cavity size was small. There is  moderate  concentric left ventricular hypertrophy. Left ventricular diastolic function could not be evaluated due to atrial fibrillation. Left ventricular diastolic function could not be evaluated. Right Ventricle: The right ventricular size is normal. No increase in right ventricular wall thickness. Right ventricular systolic function is low normal. There is moderately elevated pulmonary artery systolic pressure. The tricuspid regurgitant velocity  is 3.54 m/s, and with an assumed right atrial pressure of 15 mmHg, the estimated right ventricular systolic pressure is 60000000mmHg. Left Atrium: Left atrial size was mildly dilated. Right Atrium: Right atrial size was normal in size. Prominent Eustachian valve. Pericardium: A small pericardial effusion is present. The pericardial effusion is circumferential. Mitral Valve: The mitral valve is grossly normal. Mild to moderate mitral valve regurgitation. Tricuspid Valve: The tricuspid valve is grossly normal. Tricuspid valve regurgitation is moderate . No evidence of tricuspid stenosis. Aortic Valve: The aortic valve is tricuspid. There is mild calcification of the aortic valve. There is mild thickening of the aortic valve. Aortic valve regurgitation is trivial. Aortic regurgitation PHT measures 503 msec. Mild aortic valve sclerosis is present, with no evidence of aortic valve stenosis. Pulmonic Valve: The pulmonic valve was grossly normal. Pulmonic valve regurgitation is mild. No evidence of pulmonic stenosis. Aorta: The aortic root and ascending aorta are structurally normal, with no evidence of dilitation.  Venous: The inferior vena cava is dilated in size with less than 50% respiratory variability, suggesting right atrial pressure of 15 mmHg. IAS/Shunts: The atrial septum is grossly normal.  LEFT VENTRICLE PLAX 2D LVIDd:         3.00 cm LVIDs:         1.90 cm LV PW:         1.60 cm LV IVS:        1.50 cm LVOT diam:     1.90 cm LV SV:         53 LV SV Index:   35 LVOT  Area:     2.84 cm  RIGHT VENTRICLE RV Basal diam:  3.10 cm RV S prime:     7.71 cm/s TAPSE (M-mode): 1.1 cm RVSP:           65.1 mmHg LEFT ATRIUM             Index       RIGHT ATRIUM            Index LA diam:        3.70 cm 2.46 cm/m  RA Pressure: 15.00 mmHg LA Vol (A2C):   62.6 ml 41.66 ml/m RA Area:     13.40 cm LA Vol (A4C):   51.3 ml 34.14 ml/m RA Volume:   34.20 ml   22.76 ml/m LA Biplane Vol: 59.4 ml 39.53 ml/m  AORTIC VALVE LVOT Vmax:   81.70 cm/s LVOT Vmean:  59.600 cm/s LVOT VTI:    0.187 m AI PHT:      503 msec  AORTA Ao Root diam: 3.40 cm Ao Asc diam:  3.40 cm MR Peak grad:    165.9 mmHg  TRICUSPID VALVE MR Mean grad:    105.0 mmHg  TR Peak grad:   50.1 mmHg MR Vmax:         644.00 cm/s TR Vmax:        354.00 cm/s MR Vmean:        493.0 cm/s  Estimated RAP:  15.00 mmHg MR PISA:         1.01 cm    RVSP:           65.1 mmHg MR PISA Eff ROA: 7 mm MR PISA Radius:  0.40 cm     SHUNTS                              Systemic VTI:  0.19 m                              Systemic Diam: 1.90 cm Eleonore Chiquito MD Electronically signed by Eleonore Chiquito MD Signature Date/Time: 05/16/2021/5:25:59 PM    Final    VAS US CAROTID (at [facility] and [facility] only)  Result Date: 06/01/2021 Carotid Arterial Duplex Study Patient Name:  LAURENE BARTLES  Date of Exam:   06/01/2021 Medical Rec #: [id number]         Accession #:    [id number] Date of Birth: 07-06-1925         Patient Gender: F Patient Age:   87 years Exam Location:  [facility] Procedure:      VAS US CAROTID Referring Phys: Gean Birchwood --------------------------------------------------------------------------------  Indications:  CVA. Risk Factors: Hypertension, hyperlipidemia, coronary artery disease. Performing Technologist: Archie Patten RVS  Examination Guidelines: A complete evaluation includes B-mode imaging, spectral Doppler,  color Doppler, and power Doppler as needed of all accessible portions of each vessel. Bilateral testing is considered an integral  part of a complete examination. Limited examinations for reoccurring indications may be performed as noted.  Right Carotid Findings: +----------+--------+--------+--------+------------------+--------+           PSV cm/sEDV cm/sStenosisPlaque DescriptionComments +----------+--------+--------+--------+------------------+--------+ CCA Prox  30      8               heterogenous               +----------+--------+--------+--------+------------------+--------+ CCA Distal25      7               heterogenous               +----------+--------+--------+--------+------------------+--------+ ICA Prox  57      20      1-39%   heterogenous               +----------+--------+--------+--------+------------------+--------+ ICA Distal34      11                                         +----------+--------+--------+--------+------------------+--------+ ECA       52      6                                          +----------+--------+--------+--------+------------------+--------+ +----------+--------+-------+--------+-------------------+           PSV cm/sEDV cmsDescribeArm Pressure (mmHG) +----------+--------+-------+--------+-------------------+ [id number]                                         +----------+--------+-------+--------+-------------------+ +---------+--------+--+--------+-+---------+ VertebralPSV cm/s21EDV cm/s5Antegrade +---------+--------+--+--------+-+---------+  Left Carotid Findings: +----------+--------+--------+--------+------------------+--------+           PSV cm/sEDV cm/sStenosisPlaque DescriptionComments +----------+--------+--------+--------+------------------+--------+ CCA Prox  31      7               heterogenous               +----------+--------+--------+--------+------------------+--------+ CCA Distal28      6               heterogenous               +----------+--------+--------+--------+------------------+--------+  ICA Prox  24      10      1-39%   heterogenous               +----------+--------+--------+--------+------------------+--------+ ICA Distal25      6                                          +----------+--------+--------+--------+------------------+--------+ ECA       44                                                 +----------+--------+--------+--------+------------------+--------+ +----------+--------+--------+--------+-------------------+           PSV cm/sEDV cm/sDescribeArm Pressure (mmHG) +----------+--------+--------+--------+-------------------+ [id number]                                          +----------+--------+--------+--------+-------------------+ +---------+--------+--+--------+-+---------+  VertebralPSV cm/s22EDV cm/s5Antegrade +---------+--------+--+--------+-+---------+   Summary: Right Carotid: Velocities in the right ICA are consistent with a 1-39% stenosis. Left Carotid: Velocities in the left ICA are consistent with a 1-39% stenosis. Vertebrals: Bilateral vertebral arteries demonstrate antegrade flow. *See table(s) above for measurements and observations.  Electronically signed by Antony Contras MD on 06/01/2021 at 5:17:25 PM.    Final     Microbiology: Recent Results (from the past 240 hour(s))  SARS CORONAVIRUS 2 (TAT 6-24 HRS) Nasopharyngeal Nasopharyngeal Swab     Status: None   Collection Time: 06/08/21 11:34 AM   Specimen: Nasopharyngeal Swab  Result Value Ref Range Status   SARS Coronavirus 2 NEGATIVE NEGATIVE Final    Comment: (NOTE) SARS-CoV-2 target nucleic acids are NOT DETECTED.  The SARS-CoV-2 RNA is generally detectable in upper and lower respiratory specimens during the acute phase of infection. Negative results do not preclude SARS-CoV-2 infection, do not rule out co-infections with other pathogens, and should not be used as the sole basis for treatment or other patient management decisions. Negative results must be  combined with clinical observations, patient history, and epidemiological information. The expected result is Negative.  Fact Sheet for Patients: SugarRoll.be  Fact Sheet for Healthcare Providers: https://www.woods-mathews.com/  This test is not yet approved or cleared by the [place] FDA and  has been authorized for detection and/or diagnosis of SARS-CoV-2 by FDA under an Emergency Use Authorization (EUA). This EUA will remain  in effect (meaning this test can be used) for the duration of the COVID-19 declaration under Se ction 564(b)(1) of the Act, 21 U.S.C. section 360bbb-3(b)(1), unless the authorization is terminated or revoked sooner.  Performed at [facility], [place] [address], [place], [place] [address]      Labs: Basic Metabolic Panel: Recent Labs  Lab 06/05/21 0253 06/06/21 0328 06/11/21 0948  NA 136 138 139  K 4.5 4.1 4.5  CL 106 107 101  CO2 20* 24 21*  GLUCOSE 138* 96 199*  BUN 38* 42* 83*  CREATININE 2.36* 2.42* 4.04*  CALCIUM 9.5 9.5 9.1   Liver Function Tests: No results for input(s): AST, ALT, ALKPHOS, BILITOT, PROT, ALBUMIN in the last 168 hours.  No results for input(s): LIPASE, AMYLASE in the last 168 hours. No results for input(s): AMMONIA in the last 168 hours. CBC: Recent Labs  Lab 06/10/21 1123  WBC 12.1*  NEUTROABS 7.2  HGB 12.0  HCT 37.1  MCV 87.7  PLT 243   Cardiac Enzymes: No results for input(s): CKTOTAL, CKMB, CKMBINDEX, TROPONINI in the last 168 hours. BNP: BNP (last 3 results) Recent Labs    03/18/21 0038 06/10/21 1123  BNP 815.8* 1,535.1*    ProBNP (last 3 results) No results for input(s): PROBNP in the last 8760 hours.  CBG: Recent Labs  Lab 06/10/21 0821  GLUCAP 132*       Signed:  Kayleen Memos, MD [facility] 06/11/2021, 5:22 PM

## 2021-06-11 NOTE — Consult Note (Signed)
Cardiology Consultation:   Patient ID: Heather Saunders MRN: [id number]; DOB: 09-26-1925  Admit date: 05/31/2021 Date of Consult: 06/11/2021  PCP:  Kathyrn Lass, MD   [facility] Providers Cardiologist:  Dorris Carnes, MD   {  Patient Profile:   Heather Saunders is a 87 y.o. female with a hx of nonobstructive CAD, HTN, paroxysmal atrial fibrillation, right atrium thrombus, CKD stage IV, diastolic heart failure, pulmonary hypertension, who is being seen 06/11/2021 for the evaluation of CHF at the request of Dr Nevada Crane.  History of Present Illness:   Heather Saunders follows Dr Harrington Challenger outpatient, had NSTEMI in 2006 where cath showed non obstructive CAD.  Last stress Myoview from 07/11/2017 was low risk and normal.  She had a recent hospitalization in June 2022, found to have new onset A. fib with RVR, in the setting of AKI with Cr peaked at 5.04 and CAP.  TTE showed LVEF of 60-65% and severe LVH, severe pulmonary hypertension with PASP 60.7 mmHg, moderate left atrial enlargement, mild MR and AR and possible thrombus or myxoma in the right atrium. TEE 03/20/21 showed right atrial mass concerning for thrombus . Cardioversion was canceled, and she was rate controlled with metoprolol and diltiazem.  She was given IV heparin and eventually transition to Eliquis 2.5 mg BID for anticoagulation.  Blood culture was not consistent with endocarditis, with 1 contaminant bottle growing staph epi.  VQ scan was negative for PE.  Dopplers negative for DVT.  She followed up in the office with Derl Barrow on 05/02/21, rate was controlled on metoprolol and diltiazem, plan was to repeat echo in 4-6 weeks.   Patient returned to the ER on 05/31/2021 complaining confusion, slurred speech.  MRI brain from 05/31/2021 revealed acute left caudate nucleus CVA, otherwise normal intracranial MRA.  Patient was evaluated by neurology, felt stroke etiology is due to small vessel disease.  Echo from 06/01/2021, showed EF 55 to 60%,no RMWA, grade 1 DD,  RV systolic function mildly reduced, mildly elevated PASP with RVSP 38.9 mmHg (improved from 65 mmHg), no significant valvular disease.  She was continued on Eliquis and baby aspirin was added for stroke prophylaxis.  PT is recommending SNF.  She had difficult to control hypertension, hydralazine was added for blood pressure control.  Health care was consulted, patient was made DNR, DNI.  Cardiology is consulted today for further input of CHF.   At the time my examination she reports she is not in any pain and has no trouble breathing.  She is clearly confused and her speech is dysarthric.  I am not able to understand what she is saying.  She is alert and awake but is unable to complete sentences.  She is hemodynamically stable with blood pressures around 150/70.  Urine output is minimal with 400 cc over the last 24 hours.  She was given 40 mg of IV Lasix x2 over the last 24 hours.  She also received 20 mg x 2 of p.o. Lasix.  Laboratory data notable for significant AKI.  Creatinine up to 4.0.  BUN 83.  BNP obtained 1535.  Procalcitonin 0.32.  She does have a white count at 12.1.  She is not anemic.  Chest x-ray concerning for pulmonary edema.  Cardiology was consulted for further management.   Past Medical History:  Diagnosis Date   Chronic kidney disease    Coronary disease    NonObstructive   History of nuclear stress test    Nuclear stress test 10/18: EF 96, normal perfusion, low  risk   Hyperlipidemia    Hypertension    Myocardial infarct [facility]) 2006   Osteopenia    Osteoporosis     Past Surgical History:  Procedure Laterality Date   LIPOMA EXCISION     removed from Shoulder   TEE WITHOUT CARDIOVERSION N/A 03/20/2021   Procedure: TRANSESOPHAGEAL ECHOCARDIOGRAM (TEE);  Surgeon: Donato Heinz, MD;  Location: [facility] ENDOSCOPY;  Service: Cardiovascular;  Laterality: N/A;     Home Medications:  Prior to Admission medications   Medication Sig Start Date End Date Taking? Authorizing  Provider  acetaminophen (TYLENOL) 500 MG tablet Take 500 mg by mouth every 6 (six) hours as needed for mild pain.   Yes [provider]  apixaban (ELIQUIS) 2.5 MG TABS tablet Take 1 tablet (2.5 mg total) by mouth 2 (two) times daily. 03/26/21  Yes Imagene Sheller S, DO  atorvastatin (LIPITOR) 40 MG tablet Take 40 mg by mouth daily.   Yes [provider]  Cholecalciferol (VITAMIN D3) 50 MCG (2000 UT) capsule Take 2,000 Units by mouth daily.   Yes [provider]  diltiazem (CARDIZEM CD) 120 MG 24 hr capsule Take 1 capsule (120 mg total) by mouth daily. 03/23/21  Yes Bhagat, Bhavinkumar, PA  ezetimibe (ZETIA) 10 MG tablet TAKE 1 TABLET BY MOUTH EVERY DAY Patient taking differently: Take 10 mg by mouth daily. 07/31/20  Yes Fay Records, MD  metoprolol tartrate (LOPRESSOR) 100 MG tablet Take 1 tablet (100 mg total) by mouth 2 (two) times daily. 03/26/21  Yes Imagene Sheller S, DO  Multiple Vitamin (MULTIVITAMIN) capsule Take 1 capsule by mouth daily.   Yes [provider]  aspirin 81 MG chewable tablet Chew 1 tablet (81 mg total) by mouth daily. 06/11/21 09/09/21  Kayleen Memos, DO  diltiazem (CARDIZEM CD) 180 MG 24 hr capsule Take 1 capsule (180 mg total) by mouth daily. 06/09/21 07/09/21  Kayleen Memos, DO  feeding supplement (ENSURE ENLIVE / ENSURE PLUS) LIQD Take 237 mLs by mouth 2 (two) times daily between meals for 7 days. 06/08/21 06/15/21  Kayleen Memos, DO  furosemide (LASIX) 20 MG tablet Take 1 tablet (20 mg total) by mouth daily. 06/10/21 07/10/21  Kayleen Memos, DO  hydrALAZINE (APRESOLINE) 25 MG tablet Take 1 tablet (25 mg total) by mouth every 8 (eight) hours. 06/10/21 09/08/21  Kayleen Memos, DO  polyethylene glycol (MIRALAX / GLYCOLAX) 17 g packet Take 17 g by mouth daily as needed for mild constipation.    [provider]  sodium bicarbonate 650 MG tablet Take 1 tablet (650 mg total) by mouth daily. Patient not taking: No sig reported 03/31/21    Elmarie Shiley, MD    Inpatient Medications: Scheduled Meds:   stroke: mapping our early stages of recovery book   Does not apply Once   apixaban  2.5 mg Oral BID   aspirin  81 mg Oral Daily   atorvastatin  40 mg Oral Daily   diltiazem  60 mg Oral Q8H   ezetimibe  10 mg Oral Daily   feeding supplement  237 mL Oral BID BM   furosemide  20 mg Oral Daily   hydrALAZINE  25 mg Oral Q8H   metoprolol tartrate  100 mg Oral BID   multivitamin with minerals  1 tablet Oral Daily   Continuous Infusions:  PRN Meds: acetaminophen **OR** acetaminophen (TYLENOL) oral liquid 160 mg/5 mL **OR** acetaminophen, labetalol, polyethylene glycol  Allergies:    Allergies  Allergen Reactions  Boniva [Ibandronic Acid]     Sore throat   Fosamax [Alendronate Sodium]     Sore throat    Social History:   Social History   Socioeconomic History   Marital status: Divorced    Spouse name: Not on file   Number of children: Not on file   Years of education: Not on file   Highest education level: Not on file  Occupational History   Not on file  Tobacco Use   Smoking status: Never   Smokeless tobacco: Never  Vaping Use   Vaping Use: Never used  Substance and Sexual Activity   Alcohol use: Yes    Alcohol/week: 2.0 standard drinks    Types: 2 Glasses of wine per week   Drug use: No   Sexual activity: Not on file  Other Topics Concern   Not on file  Social History Narrative   Not on file   Social Determinants of Health   Financial Resource Strain: Not on file  Food Insecurity: Not on file  Transportation Needs: Not on file  Physical Activity: Not on file  Stress: Not on file  Social Connections: Not on file  Intimate Partner Violence: Not on file    Family History:    Family History  Problem Relation Age of Onset   Cancer Mother 65       breast     ROS:  Please see the history of present illness.   All other ROS reviewed and negative.     Physical Exam/Data:   Vitals:    06/10/21 1821 06/10/21 1953 06/11/21 0057 06/11/21 0623  BP: (!) 157/88 (!) 177/108 (!) 159/84 (!) 180/93  Pulse: 75 63 60 67  Resp: '16 20 20 19  '$ Temp: 97.7 F (36.5 C) 97.8 F (36.6 C) 97.6 F (36.4 C) 97.6 F (36.4 C)  TempSrc: Oral Oral Oral Axillary  SpO2: 99% 97% 96% 94%  Weight:    49.6 kg  Height:        Intake/Output Summary (Last 24 hours) at 06/11/2021 1147 Last data filed at 06/10/2021 2200 Gross per 24 hour  Intake 240 ml  Output --  Net 240 ml   Last 3 Weights 06/11/2021 06/01/2021 05/02/2021  Weight (lbs) 109 lb 5.6 oz 109 lb 109 lb  Weight (kg) 49.6 kg 49.442 kg 49.442 kg     Body mass index is 19.07 kg/m.  General: Frail, ill-appearing HEENT: normal Lymph: no adenopathy Neck: Elevated JVD 15 to 17 cm of water Endocrine:  No thryomegaly Vascular: No carotid bruits; FA pulses 2+ bilaterally without bruits  Cardiac:  normal S1, S2; irregular rhythm, no murmurs Lungs: Crackles at the lung bases Abd: soft, nontender, no hepatomegaly  Ext: no edema Musculoskeletal:  No deformities Skin: warm and dry  Neuro: Alert, awake, confused, sentences noticeably dysarthric  EKG:  The EKG was personally reviewed and demonstrates: Atrial fibrillation heart rate 91, old anteroseptal infarct Telemetry:  Telemetry was personally reviewed and demonstrates: Atrial fibrillation with heart rates in the 70s  Relevant CV Studies:  Echo from 06/01/21 :   1. Left ventricular ejection fraction, by estimation, is 55 to 60%. The  left ventricle has normal function. The left ventricle has no regional  wall motion abnormalities. Left ventricular diastolic parameters are  consistent with Grade I diastolic  dysfunction (impaired relaxation).   2. Right ventricular systolic function is mildly reduced. The right  ventricular size is normal. There is mildly elevated pulmonary artery  systolic pressure.  3. The mitral valve is grossly normal. No evidence of mitral valve  regurgitation.   4.  The aortic valve is tricuspid. Aortic valve regurgitation is trivial.  Aortic valve sclerosis is present, with no evidence of aortic valve  stenosis.   5. The inferior vena cava is dilated in size with >50% respiratory  variability, suggesting right atrial pressure of 8 mmHg.   6. Tricuspid valve regurgitation is mild to moderate.   Comparison(s): A prior study was performed on 05/16/21. Prior RVSP 65 mm  Hg.   Laboratory Data:  High Sensitivity Troponin:  No results for input(s): TROPONINIHS in the last 720 hours.   Chemistry Recent Labs  Lab 06/04/21 1340 06/05/21 0253 06/06/21 0328  NA 137 136 138  K 4.3 4.5 4.1  CL 103 106 107  CO2 26 20* 24  GLUCOSE 82 138* 96  BUN 34* 38* 42*  CREATININE 2.40* 2.36* 2.42*  CALCIUM 9.2 9.5 9.5  GFRNONAA 18* 19* 18*  ANIONGAP '8 10 7    '$ No results for input(s): PROT, ALBUMIN, AST, ALT, ALKPHOS, BILITOT in the last 168 hours. Hematology Recent Labs  Lab 06/10/21 1123  WBC 12.1*  RBC 4.23  HGB 12.0  HCT 37.1  MCV 87.7  MCH 28.4  MCHC 32.3  RDW 15.3  PLT 243   BNP Recent Labs  Lab 06/10/21 1123  BNP 1,535.1*    DDimer No results for input(s): DDIMER in the last 168 hours.   Radiology/Studies:  DG CHEST PORT 1 VIEW  Result Date: 06/10/2021 CLINICAL DATA:  Shortness of breath EXAM: PORTABLE CHEST 1 VIEW COMPARISON:  March 22, 2021 FINDINGS: The mediastinal contour is stable. The heart size enlarged. Increased pulmonary interstitium is identified bilaterally unchanged. Patchy consolidation of bilateral lung bases with bilateral pleural effusions unchanged. Bony structures are stable. IMPRESSION: 1. Congestive heart failure. 2. Patchy consolidation of bilateral lung bases with bilateral pleural effusions unchanged. Electronically Signed   By: Abelardo Diesel M.D.   On: 06/10/2021 09:54     Assessment and Plan:   Heather Saunders is a 87 year old female with history of persistent atrial fibrillation, diastolic heart failure, pulm  hypertension, poorly controlled hypertension, CKD stage IV who was admitted on 05/31/2021 with acute lacunar infarct.  Cardiology was consulted on 06/11/2021 due to worsening congestive heart failure.  #Acute on chronic diastolic heart failure #Pulmonary hypertension #CKD stage IV -She is grossly volume overloaded.  BNP elevated.  A. fib appears to be rate controlled. -She did receive 40 mg of IV Lasix x2 over the last 24 hours as well as 20 mg x 2 of p.o. Lasix.  Her creatinine is rising and urine output is declining. -Unclear if Lasix led to this.  Would work her up for UTI.  She is a bit confused during my examination and speech is dysarthric.  She does have a minimally elevated white count and her procalcitonin is equivocal.  Will defer this to hospital medicine. -Regarding her diastolic heart failure she has very limited options.  If her kidney function continues to decline we will have difficulty removing fluid.  She is not a candidate in my opinion for invasive procedures such as hemodialysis but this would be determined by nephrology.  For now would recommend to hold Lasix and consider nephrology consult.  Her kidney function had declined in the past and recovered and it may this time.  For now would recommend to hold further diuretics and consider nephrology evaluation.  #Uncontrolled hypertension -Likely worsened by volume  overload. -Increase hydralazine to 50 mg 3 times daily. -Holding Lasix. -Continue Cardizem 60 mg twice daily and metoprolol tartrate 100 mg twice daily.  #Acute on chronic CKD stage IV -Holding Lasix as above. -Consider work-up for UTI and AKI.  Also will consider nephrology consultation.  #Acute left caudate stroke/lacunar infarct -Small vessel stroke related to uncontrolled hypertension.  Not related to A. fib.  Continue aspirin and statin per neurology recommendation.  #Persistent atrial fibrillation -Stroke not related to A. fib as this is a lacunar infarct.   Would continue Eliquis 2.5 mg twice daily. -Continue rate control agents.    For questions or updates, please contact [place] Please consult [email or web address] for contact info under   [employer]. Audie Box, MD, [place]  [address] [place], [place] [address] [phone]  1:57 PM

## 2021-06-11 NOTE — Consult Note (Signed)
Reason for Consult: Acute kidney injury on chronic kidney disease stage IV Referring Physician: Francia Greaves, DO [facility])  HPI:  87 year old woman with past medical history significant for hypertension, dyslipidemia, nonobstructive coronary artery disease, pulmonary hypertension, atrial fibrillation on anticoagulation with Eliquis and chronic kidney disease stage IV at baseline (creatinine 1.9-2.2) following recent acute kidney injury 3 months ago that appears to have been hemodynamically mediated.  She was brought to the emergency room 11 days ago with concerns of confusion and slurred speech as well as notable changes in her ability for self-care.  MRI of the brain showed an acute left caudate nucleus infarct for which management was directed by neurology for small vessel disease with blood pressure control and ongoing anticoagulation.  Today she was noted to have evidence of acute exacerbation of congestive heart failure with preserved ejection fraction (elevated BNP/chest x-ray showing pulmonary edema) and labs are significant for acute kidney injury on chronic kidney disease with creatinine now up to 4.0.  She is confused and intermittently somnolent to be able to provide additional reliable history.  Urine output charted over the last 24 hours has been 400 cc in spite of getting furosemide 40 mg IV twice daily yesterday and 40 mg IV today along with oral furosemide 20 mg daily.  Past Medical History:  Diagnosis Date   Chronic kidney disease    Coronary disease    NonObstructive   History of nuclear stress test    Nuclear stress test 10/18: EF 96, normal perfusion, low risk   Hyperlipidemia    Hypertension    Myocardial infarct [facility]) 2006   Osteopenia    Osteoporosis     Past Surgical History:  Procedure Laterality Date   LIPOMA EXCISION     removed from Shoulder   TEE WITHOUT CARDIOVERSION N/A 03/20/2021   Procedure: TRANSESOPHAGEAL ECHOCARDIOGRAM (TEE);  Surgeon: Donato Heinz,  MD;  Location[facility] ENDOSCOPY;  Service: Cardiovascular;  Laterality: N/A;    Family History  Problem Relation Age of Onset   Cancer Mother 98       breast    Social History:  reports that she has never smoked. She has never used smokeless tobacco. She reports current alcohol use of about 2.0 standard drinks per week. She reports that she does not use drugs.  Allergies:  Allergies  Allergen Reactions   Boniva [Ibandronic Acid]     Sore throat   Fosamax [Alendronate Sodium]     Sore throat    Medications: Scheduled:   stroke: mapping our early stages of recovery book   Does not apply Once   apixaban  2.5 mg Oral BID   aspirin  81 mg Oral Daily   atorvastatin  40 mg Oral Daily   diltiazem  60 mg Oral Q8H   ezetimibe  10 mg Oral Daily   feeding supplement  237 mL Oral BID BM   hydrALAZINE  50 mg Oral Q8H   metoprolol tartrate  100 mg Oral BID   multivitamin with minerals  1 tablet Oral Daily   senna-docusate  2 tablet Oral BID   Continuous:  BMP Latest Ref Rng & Units 06/11/2021 06/06/2021 06/05/2021  Glucose 70 - 99 mg/dL 199(H) 96 138(H)  BUN 8 - 23 mg/dL 83(H) 42(H) 38(H)  Creatinine 0.44 - 1.00 mg/dL 4.04(H) 2.42(H) 2.36(H)  BUN/Creat Ratio 12 - 28 - - -  Sodium 135 - 145 mmol/L 139 138 136  Potassium 3.5 - 5.1 mmol/L 4.5 4.1 4.5  Chloride 98 -  111 mmol/L 101 107 106  CO2 22 - 32 mmol/L 21(L) 24 20(L)  Calcium 8.9 - 10.3 mg/dL 9.1 9.5 9.5   CBC Latest Ref Rng & Units 06/10/2021 06/01/2021 05/31/2021  WBC 4.0 - 10.5 K/uL 12.1(H) 9.0 10.0  Hemoglobin 12.0 - 15.0 g/dL 12.0 12.6 12.5  Hematocrit 36.0 - 46.0 % 37.1 40.1 40.5  Platelets 150 - 400 K/uL 243 207 239   Urinalysis    Component Value Date/Time   COLORURINE YELLOW 06/01/2021 [place] 06/01/2021 1114   LABSPEC 1.011 06/01/2021 1114   PHURINE 6.0 06/01/2021 1114   GLUCOSEU NEGATIVE 06/01/2021 1114   HGBUR NEGATIVE 06/01/2021 1114   BILIRUBINUR NEGATIVE 06/01/2021 1114   KETONESUR 5 (A) 06/01/2021  1114   PROTEINUR 100 (A) 06/01/2021 1114   NITRITE NEGATIVE 06/01/2021 1114   LEUKOCYTESUR NEGATIVE 06/01/2021 1114   DG CHEST PORT 1 VIEW  Result Date: 06/10/2021 CLINICAL DATA:  Shortness of breath EXAM: PORTABLE CHEST 1 VIEW COMPARISON:  March 22, 2021 FINDINGS: The mediastinal contour is stable. The heart size enlarged. Increased pulmonary interstitium is identified bilaterally unchanged. Patchy consolidation of bilateral lung bases with bilateral pleural effusions unchanged. Bony structures are stable. IMPRESSION: 1. Congestive heart failure. 2. Patchy consolidation of bilateral lung bases with bilateral pleural effusions unchanged. Electronically Signed   By: Abelardo Diesel M.D.   On: 06/10/2021 09:54    Review of Systems  Unable to perform ROS: Mental status change (Disoriented/somnolent/confused)  Blood pressure (!) 157/70, pulse (!) 58, temperature (!) 97.3 F (36.3 C), temperature source Oral, resp. rate 20, height 5' 3.5" (1.613 m), weight 49.6 kg, SpO2 98 %. Physical Exam Vitals and nursing note reviewed.  Constitutional:      Appearance: She is normal weight. She is ill-appearing.     Comments: Intermittently somnolent, confused  HENT:     Head: Normocephalic and atraumatic.     Right Ear: External ear normal.     Left Ear: External ear normal.     Nose: Nose normal.     Mouth/Throat:     Mouth: Mucous membranes are dry.  Eyes:     General: No scleral icterus.    Extraocular Movements: Extraocular movements intact.     Conjunctiva/sclera: Conjunctivae normal.  Neck:     Comments: Visibly distended jugular veins Cardiovascular:     Rate and Rhythm: Normal rate and regular rhythm.     Pulses: Normal pulses.     Heart sounds: Normal heart sounds.  Pulmonary:     Effort: Pulmonary effort is normal.     Breath sounds: Wheezing and rales present.     Comments: Fine rales over bases with expiratory wheeze Abdominal:     General: Abdomen is flat. There is no distension.      Palpations: Abdomen is soft.     Tenderness: There is no abdominal tenderness.  Musculoskeletal:     Cervical back: Normal range of motion and neck supple.     Right lower leg: No edema.     Left lower leg: No edema.  Skin:    General: Skin is warm and dry.     Coloration: Skin is pale.  Neurological:     Mental Status: She is disoriented.     Comments: Somnolent/confused    Assessment/Plan: 1.  Acute kidney injury on chronic kidney disease stage IV: This appears to be likely hemodynamically mediated in the setting of congestive heart failure with decreased effective arterial blood volume and possibly exacerbated  by diuretic use/labile blood pressures.  I have requested for bladder scan to verify whether she has any urine retention that may improve with placement of Foley catheter although physical exam did not reveal a distended bladder.  I will send off for repeat urinalysis and urine electrolytes to further evaluate her renal insufficiency.  She has not had any iodinated intravenous contrast exposure or nonsteroidal anti-inflammatory drugs and is currently not on RAAS blockers.  Unfortunately, with her advanced age and burden of comorbidities including recent CVA and physical limitations, I would not recommend renal replacement therapy as this is likely to worsen her quality of life and hasten mortality.  I will try a combination of albumin/furosemide to see if we can mobilize her interstitial fluid/pulmonary edema and achieve successful diuresis; if unsuccessful, would recommend palliative care. Avoid nephrotoxic medications including NSAIDs and iodinated intravenous contrast exposure unless the latter is absolutely indicated.  Preferred narcotic agents for pain control are hydromorphone, fentanyl, and methadone. Morphine should not be used. Avoid Baclofen and avoid oral sodium phosphate and magnesium citrate based laxatives / bowel preps. Continue strict Input and Output monitoring. Will monitor  the patient closely with you and intervene or adjust therapy as indicated by changes in clinical status/labs. 2.  Acute exacerbation of diastolic heart failure: Poor response to diuretics over the past 36 hours and I will retry combination of albumin and furosemide to see if we can promote some diuresis.  Overall prognosis appears poor if renal function does not improve. 3.  Hypertension: Remains poorly controlled on hydralazine (just uptitrated by cardiology), diltiazem and metoprolol and will attempt diuresis. 4.  Confusion/deconditioning: Secondary to acute left caudate infarct.  Attempting blood pressure control with ongoing anticoagulation with Eliquis.   Marga Gramajo K. 06/11/2021, 6:15 PM

## 2021-06-11 NOTE — Progress Notes (Addendum)
This chaplain responded to PMT consult for spiritual care and connection to community clergy.  The chaplain was updated by the Pt. RN-Miriam before the Pt. visit.  The Pt. is awake and sitting in the bedside recliner. The Pt. is initially closed to a visit. However, the chaplain found a place of presence in sitting down and connecting with the Pt. remembering "Tammy Sours" as a movie star on TV.    The chaplain understands from the Pt. she has a relationship with [employer]. The chaplain's phone call to the Pt. nephew-David clarifies a long standing relationship and clergy from [employer] is visiting the Pt.   The Pt. nephew is appreciative of the call. The Pt. accepted the chaplain's invitation for prayer and F/U spiritual care as needed.  **The Pt. preferred name is Heather Saunders.

## 2021-06-11 NOTE — Progress Notes (Signed)
Physical Therapy Treatment Patient Details Name: Heather Saunders MRN: [id number] DOB: 1924/12/20 Today's Date: 06/11/2021   History of Present Illness 87 yr old female who presented due to change in cognition. Pt found to have an acute left caudate nucleus CVA.  PMH but to limted to: pulmonary HTN, HTNN, afib, CKD, chronic diastolic congestive heart faluire    PT Comments    Pt was seen for mobility on RW with help to manage direction and safety of obstacle negotiation. Her limits are cognition and endurance currently, and struggles to set limits for her tolerance and to know how to direct the walker.  Pt is encouraged to contact nursing with call light but will need to be closely monitored by staff even with the chair pad in place.  Nursing is aware of her struggles with recall of safety and other information.   Recommendations for follow up therapy are one component of a multi-disciplinary discharge planning process, led by the attending physician.  Recommendations may be updated based on patient status, additional functional criteria and insurance authorization.  Follow Up Recommendations  SNF     Equipment Recommendations  Rolling walker with 5" wheels;3in1 (PT);Wheelchair (measurements PT)    Recommendations for Other Services       Precautions / Restrictions Precautions Precautions: Fall Precaution Comments: confused and high fall risk Restrictions Weight Bearing Restrictions: No     Mobility  Bed Mobility Overal bed mobility: Needs Assistance             General bed mobility comments: up in chair when PT arrives    Transfers Overall transfer level: Needs assistance Equipment used: Rolling walker (2 wheeled) Transfers: Sit to/from Stand Sit to Stand: Min assist         General transfer comment: min assist to power up but pt automatically puts hands on chair to stand  Ambulation/Gait Ambulation/Gait assistance: Min assist Gait Distance (Feet): 60 Feet (30  x 2) Assistive device: Rolling walker (2 wheeled);1 person hand held assist Gait Pattern/deviations: Step-to pattern;Step-through pattern;Decreased stride length;Wide base of support;Decreased step length - left Gait velocity: reduced   General Gait Details: requires directional help on walker as she is inattentive to L side, tends to turn to L side   Stairs             Wheelchair Mobility    Modified Rankin (Stroke Patients Only) Modified Rankin (Stroke Patients Only) Pre-Morbid Rankin Score: No significant disability Modified Rankin: Moderately severe disability     Balance Overall balance assessment: Needs assistance Sitting-balance support: Feet supported Sitting balance-Leahy Scale: Fair     Standing balance support: Bilateral upper extremity supported;During functional activity Standing balance-Leahy Scale: Poor Standing balance comment: walker is essential and pt tends to let go at times                            Cognition Arousal/Alertness: Awake/alert Behavior During Therapy: Impulsive Overall Cognitive Status: Impaired/Different from baseline Area of Impairment: Awareness;Safety/judgement;Following commands;Memory;Attention;Orientation                 Orientation Level: Situation;Time Current Attention Level: Selective Memory: Decreased recall of precautions;Decreased short-term memory Following Commands: Follows one step commands inconsistently;Follows one step commands with increased time Safety/Judgement: Decreased awareness of safety;Decreased awareness of deficits Awareness: Intellectual Problem Solving: Slow processing;Requires verbal cues;Requires tactile cues General Comments: pt is forgetful in the moment of walking about turning walker or how to plan safety, even with reminders  Exercises      General Comments General comments (skin integrity, edema, etc.): pt is mildly confused and requires assistance to manage  safety, including repositioning on the chair, using chair alarm and talking with her about her needs.  Does not voice her needs completely      Pertinent Vitals/Pain Pain Assessment: No/denies pain    Home Living                      Prior Function            PT Goals (current goals can now be found in the care plan section) Progress towards PT goals: Progressing toward goals    Frequency    Min 3X/week      PT Plan Current plan remains appropriate    Co-evaluation              AM-PAC PT "6 Clicks" Mobility   Outcome Measure  Help needed turning from your back to your side while in a flat bed without using bedrails?: A Little Help needed moving from lying on your back to sitting on the side of a flat bed without using bedrails?: A Little Help needed moving to and from a bed to a chair (including a wheelchair)?: A Little Help needed standing up from a chair using your arms (e.g., wheelchair or bedside chair)?: A Little Help needed to walk in hospital room?: A Little Help needed climbing 3-5 steps with a railing? : A Lot 6 Click Score: 17    End of Session Equipment Utilized During Treatment: Gait belt Activity Tolerance: Patient limited by fatigue;Treatment limited secondary to medical complications (Comment) Patient left: in chair;with call bell/phone within reach;with chair alarm set Nurse Communication: Mobility status PT Visit Diagnosis: Other abnormalities of gait and mobility (R26.89);Muscle weakness (generalized) (M62.81);Other symptoms and signs involving the nervous system (R29.898)     Time: [id number] PT Time Calculation (min) (ACUTE ONLY): 18 min  Charges:  $Gait Training: 8-22 mins $Therapeutic Activity: 8-22 mins               Ramond Dial 06/11/2021, 1:56 PM  Mee Hives, PT MS Acute Rehab Dept. Number: [place] and [place]

## 2021-06-12 ENCOUNTER — Telehealth: Payer: Self-pay | Admitting: Internal Medicine

## 2021-06-12 ENCOUNTER — Inpatient Hospital Stay (HOSPITAL_COMMUNITY): Payer: Medicare Other

## 2021-06-12 DIAGNOSIS — I4819 Other persistent atrial fibrillation: Secondary | ICD-10-CM | POA: Diagnosis not present

## 2021-06-12 DIAGNOSIS — I63312 Cerebral infarction due to thrombosis of left middle cerebral artery: Secondary | ICD-10-CM | POA: Diagnosis not present

## 2021-06-12 DIAGNOSIS — I5032 Chronic diastolic (congestive) heart failure: Secondary | ICD-10-CM | POA: Diagnosis not present

## 2021-06-12 LAB — COMPREHENSIVE METABOLIC PANEL
ALT: 33 U/L (ref 0–44)
AST: 35 U/L (ref 15–41)
Albumin: 3.6 g/dL (ref 3.5–5.0)
Alkaline Phosphatase: 87 U/L (ref 38–126)
Anion gap: 14 (ref 5–15)
BUN: 90 mg/dL — ABNORMAL HIGH (ref 8–23)
CO2: 20 mmol/L — ABNORMAL LOW (ref 22–32)
Calcium: 9.2 mg/dL (ref 8.9–10.3)
Chloride: 104 mmol/L (ref 98–111)
Creatinine, Ser: 4.49 mg/dL — ABNORMAL HIGH (ref 0.44–1.00)
GFR, Estimated: 9 mL/min — ABNORMAL LOW (ref >60)
Glucose, Bld: 134 mg/dL — ABNORMAL HIGH (ref 70–99)
Potassium: 4.8 mmol/L (ref 3.5–5.1)
Sodium: 138 mmol/L (ref 135–145)
Total Bilirubin: 2.2 mg/dL — ABNORMAL HIGH (ref 0.3–1.2)
Total Protein: 5.8 g/dL — ABNORMAL LOW (ref 6.5–8.1)

## 2021-06-12 LAB — CBC
HCT: 31.1 % — ABNORMAL LOW (ref 36.0–46.0)
Hemoglobin: 10 g/dL — ABNORMAL LOW (ref 12.0–15.0)
MCH: 28 pg (ref 26.0–34.0)
MCHC: 32.2 g/dL (ref 30.0–36.0)
MCV: 87.1 fL (ref 80.0–100.0)
Platelets: 210 10*3/uL (ref 150–400)
RBC: 3.57 MIL/uL — ABNORMAL LOW (ref 3.87–5.11)
RDW: 15.5 % (ref 11.5–15.5)
WBC: 11.9 10*3/uL — ABNORMAL HIGH (ref 4.0–10.5)
nRBC: 0 % (ref 0.0–0.2)

## 2021-06-12 MED ORDER — HYDRALAZINE HCL 25 MG PO TABS: 75.0000 mg | ORAL_TABLET | Freq: Three times a day (TID) | ORAL | 0 refills | Status: AC

## 2021-06-12 MED ORDER — HYDRALAZINE HCL 50 MG PO TABS
75.0000 mg | ORAL_TABLET | Freq: Three times a day (TID) | ORAL | Status: DC
  Administered 2021-06-12 – 2021-06-14 (×6): 75 mg via ORAL
  Filled 2021-06-12 (×6): qty 1

## 2021-06-12 NOTE — Telephone Encounter (Signed)
Spoke with Heather Saunders in the pts DPR with approval to speak with his brother, [name] [phone]... and I spoke with [name] and give him her echo results from 04/2021.   Pt is in the hospital with Renal failure and will be transferring to [facility] for more permanent care.

## 2021-06-12 NOTE — Progress Notes (Signed)
Progress note  Ellakate Bayliff [id number] DOB: [birth year]  PCP: Kathyrn Lass, MD  Recommendations for Outpatient Follow-up:  Follow-up with neurology Follow-up with cardiology Follow-up with your primary care provider Take your medications as prescribed Continue PT OT with assistance and fall precautions  Discharge Diagnoses:  Active Hospital Problems   Diagnosis Date Noted   CVA (cerebral vascular accident) ([place]) 06/01/2021   Pressure injury of skin 06/11/2021   Protein-calorie malnutrition, severe 06/08/2021   CKD (chronic kidney disease), stage IV (HCC)    Chronic diastolic congestive heart failure ([place])    Acute encephalopathy 05/31/2021   Atrial fibrillation ([place]) 05/31/2021   Pulmonary HTN ([place])    Essential hypertension 08/03/2018    Resolved Hospital Problems  No resolved problems to display.     Vitals:   06/12/21 1143 06/12/21 1228  BP: (!) 172/95 (!) 156/100  Pulse: 77 85  Resp: 18 19  Temp: 97.6 F (36.4 C) 97.7 F (36.5 C)  SpO2: 97% 95%    History of present illness:  Patient 87 year old female history of A. fib on Eliquis, pulmonary hypertension, chronic kidney disease stage IV, anemia of chronic disease, presented to the ED with confusion as noted by home health aide with some associated slurred speech.  Work-up in the ED with head CT being unremarkable, MRI head consistent with acute CVA.  Patient admitted for stroke work-up.  Hospital course complicated by generalized weakness for which PT OT recommended SNF placement.  Uncontrolled hypertension, home Cardizem dose was increased and p.o. hydralazine 50 mg 3 times daily was added.  Also complicated by AKI on CKD 4 for which nephrology was consulted.  She has been seen by palliative care team, her CODE STATUS was changed to DNR on 06/10/2021.   06/12/21: The patient was seen and examined in her room.  Alert and ill-appearing.  She denies having any pain.  Hospital Course:  Principal Problem:   CVA  (cerebral vascular accident) [facility]) Active Problems:   Essential hypertension   Pulmonary HTN ([place])   Acute encephalopathy   Atrial fibrillation (HCC)   CKD (chronic kidney disease), stage IV (HCC)   Chronic diastolic congestive heart failure (HCC)   Protein-calorie malnutrition, severe   Pressure injury of skin  Acute left caudate nucleus CVA/acute metabolic encephalopathy -Patient presented with confusion and slurred speech. -Head CT done on admission negative for any acute abnormalities. -MRI brain/MRA head done consistent with small acute infarct of the left caudate nucleus, no hemorrhage or mass-effect.  Normal intracranial MRA. -Per neurology patient with left caudate head infarct likely secondary to small vessel disease source in the setting of A. fib and on chronic anticoagulation.  -2D echo done with normal EF, no source of emboli. -Carotid Dopplers done with no significant ICA stenosis. -Patient noted to have significant lethargy on 06/01/2021 after patient noted to have received IV Ativan the evening of 05/31/2021 prior to MRI.  -Repeat head CT done with no hemorrhagic conversion of acute CVA and no new infarct. -Patient noted to have been started on aspirin on admission.   She was seen by neurology/stroke team. Stroke team recommended resumption of Eliquis 2.5 mg twice daily and addition of aspirin 81 mg daily for further stroke prophylaxis. PT OT recommended SNF. She is on dysphagia 3 diet. Outpatient follow-up with neurology.  AKI on CKD 4 Baseline creatinine 1.8 with GFR of 25 Lasix discontinued, avoid nephrotoxic agents Nephrology following, appreciate assistance. Creatinine peaked at 4.54 during this admission Slightly downtrending after albumin/Lasix challenge by  nephrology, repeat creatinine 4.49. Closely monitor urine output, per nursing patient is incontinent of urine. Repeat BMP in the morning  Anion gap metabolic acidosis in the setting of acute on chronic renal  failure Serum bicarb 20, anion gap of 14 Management per nephrology, may need bicarb replacement if continues to trend down.  Acute metabolic encephalopathy, suspect secondary to acute illness Chest x-ray 06/10/2021 revealed pulmonary edema, received 1 dose of IV Lasix due to increased work of breathing.  Home p.o. Lasix was restarted 20 mg daily.  July 12, 2021 received another dose of IV Lasix 40 mg x 1 and home p.o. dose of Lasix 20 mg daily, prior to results of BMP which showed uptrending creatinine 4.04 from 2.42 on 06/06/2021. UA and urine culture ordered, pending. Reorient as needed, fall, aspiration precautions.  Acute on chronic diastolic CHF BNP elevated greater than 1500 Right JVD on exam Last 2D echo on 06/01/2021 showed normal LVEF 55 to 60%, left ventricle had no regional wall motion abnormalities, grade 1 diastolic dysfunction Continue strict I's and O's and daily weight Cardiology consulted  Pulmonary edema, cardiogenic Resume home diuretics Last 2D echo done on 06/01/2021 showed LVEF 55 to 60% with grade 1 diastolic dysfunction. Strict I's and O's and daily weight And I&O +1.6 L  Hypertension uncontrolled -BP not at goal, elevated Continue increased home dose of Cardizem 180 mg daily Continue home Lopressor 100 mg twice daily.    Continue p.o. hydralazine 50 mg 3 times daily Continue to monitor vital signs.  Paroxysmal A. fib, rate is controlled Rate controlled on Cardizem and Lopressor.   -Eliquis for secondary CVA prevention.  History of pulmonary hypertension Continue to hold off home Lasix due to AKI  History of right atrial thrombus -2D echo done 2 weeks prior to admission with no signs of thrombus noted. -Repeat 2D echo done with EF of 55 to 60%, NWMA, mildly reduced right ventricular systolic function, right ventricular size normal, mildly elevated pulmonary artery systolic pressure, mild to moderate TVR. -No thrombus noted on 2D echo -Continue Eliquis.    Generalized weakness/ambulatory dysfunction OT recommending SNF CSW is working on SNF placement. Awaiting insurance authorization.  Constipation Senokot 2 tablets twice daily MiraLAX daily as needed  Goals of care Palliative Care team consulted Continue palliative care outpatient DNR on 06/10/21       DVT prophylaxis: Eliquis Code Status: DNR. Family Communication: Updated her nephew via phone. Disposition:    Status is: Inpatient   The patient will require care spanning > 2 midnights and should be moved to inpatient because: Inpatient level of care appropriate due to severity of illness   Dispo: The patient is from: Home              Anticipated d/c is to: SNF.              Patient currently is currently not stable for discharge due to AKI and acute CHF.              Difficult to place patient No           Consultants:  Neurology: Dr.Khaliqdina 05/31/2021   Procedures: CT head 06/01/2021, 05/31/2021 MRI brain 05/31/2021 MRA head 05/31/2021 2D echo 06/01/2021 Carotid Dopplers 06/01/2021     Antimicrobials:  None  Discharge Exam: BP (!) 156/100 (BP Location: Left Arm)   Pulse 85   Temp 97.7 F (36.5 C) (Axillary)   Resp 19   Ht 5' 3.5" (1.613 m)   Wt 52 kg  SpO2 95%   BMI 19.99 kg/m  General: 87 y.o. year-old female Ill-appearing in no acute distress.   Cardiovascular: Regular rate and rhythm no rubs or gallops. Respiratory: Rales noted with auscultation of right lower lobe.  Poor inspiratory effort. Abdomen: Soft nontender normal bowel sounds present.   Musculoskeletal: No lower extremity edema bilaterally. Skin: No ulcerative lesions noted. Psychiatry: Mood is appropriate for condition and setting.  Discharge Instructions You were cared for by a hospitalist during your hospital stay. If you have any questions about your discharge medications or the care you received while you were in the hospital after you are discharged, you can call the unit and asked to  speak with the hospitalist on call if the hospitalist that took care of you is not available. Once you are discharged, your primary care physician will handle any further medical issues. Please note that NO REFILLS for any discharge medications will be authorized once you are discharged, as it is imperative that you return to your primary care physician (or establish a relationship with a primary care physician if you do not have one) for your aftercare needs so that they can reassess your need for medications and monitor your lab values.  Discharge Instructions     Ambulatory referral to Neurology   Complete by: As directed    Follow up with stroke clinic NP (Jessica Vanschaick or Cecille Rubin, if both not available, consider Zachery Dauer, or [place]) at [facility] in about 4 weeks. Thanks.      Allergies as of 06/12/2021       Reactions   Boniva [ibandronic Acid]    Sore throat   Fosamax [alendronate Sodium]    Sore throat        Medication List     STOP taking these medications    furosemide 20 MG tablet Commonly known as: Lasix   sodium bicarbonate 650 MG tablet       TAKE these medications    acetaminophen 500 MG tablet Commonly known as: TYLENOL Take 500 mg by mouth every 6 (six) hours as needed for mild pain.   apixaban 2.5 MG Tabs tablet Commonly known as: ELIQUIS Take 1 tablet (2.5 mg total) by mouth 2 (two) times daily.   aspirin 81 MG chewable tablet Chew 1 tablet (81 mg total) by mouth daily.   atorvastatin 40 MG tablet Commonly known as: LIPITOR Take 40 mg by mouth daily.   diltiazem 180 MG 24 hr capsule Commonly known as: CARDIZEM CD Take 1 capsule (180 mg total) by mouth daily. What changed:  medication strength how much to take   ezetimibe 10 MG tablet Commonly known as: ZETIA TAKE 1 TABLET BY MOUTH EVERY DAY   feeding supplement Liqd Take 237 mLs by mouth 2 (two) times daily between meals for 7 days.   hydrALAZINE 25 MG tablet Commonly  known as: APRESOLINE Take 3 tablets (75 mg total) by mouth every 8 (eight) hours.   metoprolol tartrate 100 MG tablet Commonly known as: LOPRESSOR Take 1 tablet (100 mg total) by mouth 2 (two) times daily.   multivitamin capsule Take 1 capsule by mouth daily.   polyethylene glycol 17 g packet Commonly known as: MIRALAX / GLYCOLAX Take 17 g by mouth daily as needed for mild constipation.   Vitamin D3 50 MCG (2000 UT) capsule Take 2,000 Units by mouth daily.               Durable Medical Equipment  (From admission, onward)  Start     Ordered   06/08/21 1156  For home use only DME 3 n 1  Once        06/08/21 1156   06/08/21 1156  For home use only DME standard manual wheelchair with seat cushion  Once       Comments: Patient suffers from ambulatory dysfunction which impairs their ability to perform daily activities like walking in the home.  A cane will not resolve issue with performing activities of daily living. A wheelchair will allow patient to safely perform daily activities. Patient can safely propel the wheelchair in the home or has a caregiver who can provide assistance. Length of need 6 months. Accessories: elevating leg rests, wheel locks, extensions and anti-tippers.   06/08/21 1156   06/08/21 1155  For home use only DME Walker rolling  Once       Question Answer Comment  Walker: With 5 Inch Wheels   Patient needs a walker to treat with the following condition Ambulatory dysfunction      06/08/21 1156           Allergies  Allergen Reactions   Boniva [Ibandronic Acid]     Sore throat   Fosamax [Alendronate Sodium]     Sore throat    Follow-up Information     [facility]. Schedule an appointment as soon as possible for a visit in 1 month(s).   Specialty: Neurology Why: stroke clinic Contact information: [place] [phone]        Kathyrn Lass, MD. Call today.    Specialty: Family Medicine Why: Please call for a posthospital follow-up Contact information: [place] [address] [phone]         Fay Records, MD .   Specialty: Cardiology Contact information: [address] [address] [phone]                  The results of significant diagnostics from this hospitalization (including imaging, microbiology, ancillary and laboratory) are listed below for reference.    Significant Diagnostic Studies: CT HEAD WO CONTRAST (5MM)  Result Date: 06/01/2021 CLINICAL DATA:  Neuro deficit EXAM: CT HEAD WITHOUT CONTRAST TECHNIQUE: Contiguous axial images were obtained from the base of the skull through the vertex without intravenous contrast. COMPARISON:  CT head 05/31/2021 FINDINGS: Brain: There is hypodensity centered in the left caudate body extending towards the lentiform nucleus consistent with evolving subacute infarct as seen on the prior MRI. There is no evidence of hemorrhagic conversion. There is no new infarct. There is no evidence of acute intracranial hemorrhage or extra-axial fluid collection. Mild parenchymal volume loss is unchanged. Remote infarcts in the bilateral basal ganglia are unchanged. There is no mass lesion. There is no midline shift. The ventricles are stable in size. Vascular: No hyperdense vessel or unexpected calcification. Skull: Normal. Negative for fracture or focal lesion. Sinuses/Orbits: The paranasal sinuses are clear. Bilateral lens implants are in place. The globes and orbits are otherwise unremarkable. Other: None. IMPRESSION: 1. Evolving subacute infarct in the left caudate body without evidence of hemorrhagic transformation. 2. No new infarct or hemorrhage. Electronically Signed   By: Valetta Mole M.D.   On: 06/01/2021 14:37   CT HEAD WO CONTRAST  Result Date: 05/31/2021 CLINICAL DATA:  Altered mental status. EXAM: CT HEAD WITHOUT CONTRAST TECHNIQUE:  Contiguous axial images were obtained from the base of the skull through the vertex without  intravenous contrast. COMPARISON:  None. FINDINGS: Brain: Mild age-related atrophy and chronic microvascular ischemic changes. There is no acute intracranial hemorrhage. No mass effect midline shift. No extra-axial fluid collection. Vascular: No hyperdense vessel or unexpected calcification. Skull: Normal. Negative for fracture or focal lesion. Sinuses/Orbits: No acute finding. Other: None IMPRESSION: 1. No acute intracranial pathology. 2. Mild age-related atrophy and chronic microvascular ischemic changes. Electronically Signed   By: Anner Crete M.D.   On: 05/31/2021 19:19   MR ANGIO HEAD WO CONTRAST  Result Date: 05/31/2021 CLINICAL DATA:  Acute neurologic deficit EXAM: MRI HEAD WITHOUT CONTRAST MRA HEAD WITHOUT CONTRAST TECHNIQUE: Multiplanar, multi-echo pulse sequences of the brain and surrounding structures were acquired without intravenous contrast. Angiographic images of the Circle of Willis were acquired using MRA technique without intravenous contrast. COMPARISON:  No pertinent prior exam. 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 No acute or chronic hemorrhage. There is multifocal hyperintense T2-weighted signal within the white matter. Generalized volume loss without a clear lobar predilection. The midline structures are normal. Vascular: Major flow voids are preserved. Skull and upper cervical spine: Normal calvarium and skull base. Visualized upper cervical spine and soft tissues are normal. Sinuses/Orbits:No paranasal sinus fluid levels or advanced mucosal thickening. No mastoid or middle ear effusion. Normal orbits. MRA HEAD FINDINGS POSTERIOR CIRCULATION: --Vertebral arteries: Normal --Inferior cerebellar arteries: Normal. --Basilar artery: Normal. --Superior cerebellar arteries: Normal. --Posterior cerebral arteries: Normal. ANTERIOR CIRCULATION:  --Intracranial internal carotid arteries: Normal. --Anterior cerebral arteries (ACA): Normal. --Middle cerebral arteries (MCA): Normal. ANATOMIC VARIANTS: Fetal origin of both posterior cerebral arteries. IMPRESSION: 1. 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 No hemorrhage or mass effect. 2. Normal intracranial MRA. Electronically Signed   By: Ulyses Jarred M.D.   On: 05/31/2021 22:26   MR BRAIN WO CONTRAST  Result Date: 05/31/2021 CLINICAL DATA:  Acute neurologic deficit EXAM: MRI HEAD WITHOUT CONTRAST MRA HEAD WITHOUT CONTRAST TECHNIQUE: Multiplanar, multi-echo pulse sequences of the brain and surrounding structures were acquired without intravenous contrast. Angiographic images of the Circle of Willis were acquired using MRA technique without intravenous contrast. COMPARISON:  No pertinent prior exam. 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 No acute or chronic hemorrhage. There is multifocal hyperintense T2-weighted signal within the white matter. Generalized volume loss without a clear lobar predilection. The midline structures are normal. Vascular: Major flow voids are preserved. Skull and upper cervical spine: Normal calvarium and skull base. Visualized upper cervical spine and soft tissues are normal. Sinuses/Orbits:No paranasal sinus fluid levels or advanced mucosal thickening. No mastoid or middle ear effusion. Normal orbits. MRA HEAD FINDINGS POSTERIOR CIRCULATION: --Vertebral arteries: Normal --Inferior cerebellar arteries: Normal. --Basilar artery: Normal. --Superior cerebellar arteries: Normal. --Posterior cerebral arteries: Normal. ANTERIOR CIRCULATION: --Intracranial internal carotid arteries: Normal. --Anterior cerebral arteries (ACA): Normal. --Middle cerebral arteries (MCA): Normal. ANATOMIC VARIANTS: Fetal origin of both posterior cerebral arteries. IMPRESSION: 1. 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 No hemorrhage or mass effect.  2. Normal intracranial MRA. Electronically Signed   By: Ulyses Jarred M.D.   On: 05/31/2021 22:26   DG CHEST PORT 1 VIEW  Result Date: 06/10/2021 CLINICAL DATA:  Shortness of breath EXAM: PORTABLE CHEST 1 VIEW COMPARISON:  March 22, 2021 FINDINGS: The mediastinal contour is stable. The heart size enlarged. Increased pulmonary interstitium is identified bilaterally unchanged. Patchy consolidation of bilateral lung bases with bilateral pleural effusions unchanged. Bony structures are stable. IMPRESSION: 1. Congestive heart  failure. 2. Patchy consolidation of bilateral lung bases with bilateral pleural effusions unchanged. Electronically Signed   By: Abelardo Diesel M.D.   On: 06/10/2021 09:54   ECHOCARDIOGRAM COMPLETE  Result Date: 06/01/2021    ECHOCARDIOGRAM REPORT   Patient Name:   LEINANI URBANSKI Date of Exam: 06/01/2021 Medical Rec #:  [id number]        Height:       63.5 in Accession #:    [id number]       Weight:       109.0 lb Date of Birth:  [birth year]        BSA:          1.503 m Patient Age:    87 years         BP:           158/87 mmHg Patient Gender: F                HR:           70 bpm. Exam Location:  Inpatient Procedure: 2D Echo, Cardiac Doppler and Color Doppler Indications:    Stroke I63.9  History:        Patient has prior history of Echocardiogram examinations, most                 recent 05/16/2021. Previous Myocardial Infarction; Risk                 Factors:Dyslipidemia and Hypertension.  Sonographer:    Bernadene Person RDCS Referring Phys: [place]  1. Left ventricular ejection fraction, by estimation, is 55 to 60%. The left ventricle has normal function. The left ventricle has no regional wall motion abnormalities. Left ventricular diastolic parameters are consistent with Grade I diastolic dysfunction (impaired relaxation).  2. Right ventricular systolic function is mildly reduced. The right ventricular size is normal. There is mildly elevated pulmonary artery  systolic pressure.  3. The mitral valve is grossly normal. No evidence of mitral valve regurgitation.  4. The aortic valve is tricuspid. Aortic valve regurgitation is trivial. Aortic valve sclerosis is present, with no evidence of aortic valve stenosis.  5. The inferior vena cava is dilated in size with >50% respiratory variability, suggesting right atrial pressure of 8 mmHg.  6. Tricuspid valve regurgitation is mild to moderate. Comparison(s): A prior study was performed on 05/16/21. Prior RVSP 65 mm Hg. FINDINGS  Left Ventricle: Left ventricular ejection fraction, by estimation, is 55 to 60%. The left ventricle has normal function. The left ventricle has no regional wall motion abnormalities. The left ventricular internal cavity size was small. There is no left ventricular hypertrophy. Left ventricular diastolic parameters are consistent with Grade I diastolic dysfunction (impaired relaxation). Right Ventricle: The right ventricular size is normal. No increase in right ventricular wall thickness. Right ventricular systolic function is mildly reduced. There is mildly elevated pulmonary artery systolic pressure. The tricuspid regurgitant velocity  is 2.78 m/s, and with an assumed right atrial pressure of 8 mmHg, the estimated right ventricular systolic pressure is [id number] mmHg. Left Atrium: Left atrial size was normal in size. Right Atrium: Right atrial size was normal in size. Pericardium: Trivial pericardial effusion is present. Mitral Valve: The mitral valve is grossly normal. No evidence of mitral valve regurgitation. Tricuspid Valve: The tricuspid valve is normal in structure. Tricuspid valve regurgitation is mild to moderate. Aortic Valve: The aortic valve is tricuspid. There is mild calcification of the aortic valve. There is mild thickening of  the aortic valve. Aortic valve regurgitation is trivial. Aortic regurgitation PHT measures 932 msec. Mild aortic valve sclerosis is present, with no evidence of aortic  valve stenosis. Pulmonic Valve: The pulmonic valve was not well visualized. Pulmonic valve regurgitation is mild to moderate. No evidence of pulmonic stenosis. Aorta: The aortic root and ascending aorta are structurally normal, with no evidence of dilitation. Venous: The inferior vena cava is dilated in size with greater than 50% respiratory variability, suggesting right atrial pressure of 8 mmHg. IAS/Shunts: The atrial septum is grossly normal.  LEFT VENTRICLE PLAX 2D LVIDd:         3.10 cm  Diastology LVIDs:         2.20 cm  LV e' medial:    3.09 cm/s LV PW:         1.10 cm  LV E/e' medial:  11.4 LV IVS:        0.90 cm  LV e' lateral:   3.32 cm/s LVOT diam:     2.00 cm  LV E/e' lateral: 10.6 LV SV:         54 LV SV Index:   36 LVOT Area:     3.14 cm  RIGHT VENTRICLE RV S prime:     6.98 cm/s TAPSE (M-mode): 1.3 cm LEFT ATRIUM             Index       RIGHT ATRIUM           Index LA diam:        3.20 cm 2.13 cm/m  RA Area:     11.20 cm LA Vol (A2C):   38.8 ml 25.82 ml/m RA Volume:   25.30 ml  16.84 ml/m LA Vol (A4C):   38.7 ml 25.75 ml/m LA Biplane Vol: 39.5 ml 26.28 ml/m  AORTIC VALVE             PULMONIC VALVE LVOT Vmax:   88.93 cm/s  PR End Diast Vel: 9.61 msec LVOT Vmean:  60.933 cm/s LVOT VTI:    0.171 m AI PHT:      932 msec  AORTA Ao Root diam: 3.10 cm Ao Asc diam:  3.30 cm MITRAL VALVE               TRICUSPID VALVE MV Area (PHT): 3.72 cm    TR Peak grad:   30.9 mmHg MV Decel Time: 204 msec    TR Vmax:        278.00 cm/s MV E velocity: 35.20 cm/s MV A velocity: 84.20 cm/s  SHUNTS MV E/A ratio:  0.42        Systemic VTI:  0.17 m                            Systemic Diam: 2.00 cm Rudean Haskell MD Electronically signed by Rudean Haskell MD Signature Date/Time: 06/01/2021/12:16:58 PM    Final    ECHOCARDIOGRAM COMPLETE  Result Date: 05/16/2021    ECHOCARDIOGRAM REPORT   Patient Name:   VEE SOLAN Date of Exam: 05/16/2021 Medical Rec #:  [id number]        Height:       63.5 in Accession  #:    [id number]       Weight:       109.0 lb Date of Birth:  Apr 22, 1925        BSA:          1.503 m Patient Age:  89 years         BP:           150/88 mmHg Patient Gender: F                HR:           96 bpm. Exam Location:  [address] Procedure: 2D Echo, Cardiac Doppler and Color Doppler Indications:    I51.89 Right atrial mass  History:        Patient has prior history of Echocardiogram examinations, most                 recent 03/19/2021. CHF, CAD and Previous Myocardial Infarction,                 Arrythmias:Atrial Fibrillation; Risk Factors:Hypertension and                 Dyslipidemia. CKD stage 4. Right atrial mass.  Sonographer:    Basilia Jumbo BS, RDCS Referring Phys: [place]  1. Prominent eustachian valve seen in the RA. No evidence of thrombus seen on prior TEE. Would consider repeat TEE to definitively exclude.  2. Left ventricular ejection fraction, by estimation, is 55 to 60%. The left ventricle has normal function. The left ventricle has no regional wall motion abnormalities. There is moderate concentric left ventricular hypertrophy. Left ventricular diastolic function could not be evaluated.  3. Right ventricular systolic function is low normal. The right ventricular size is normal. There is moderately elevated pulmonary artery systolic pressure. The estimated right ventricular systolic pressure is 0000000 mmHg.  4. Left atrial size was mildly dilated.  5. A small pericardial effusion is present. The pericardial effusion is circumferential.  6. The mitral valve is grossly normal. Mild to moderate mitral valve regurgitation.  7. Tricuspid valve regurgitation is moderate.  8. The aortic valve is tricuspid. There is mild calcification of the aortic valve. There is mild thickening of the aortic valve. Aortic valve regurgitation is trivial. Mild aortic valve sclerosis is present, with no evidence of aortic valve stenosis.  9. The inferior vena cava is dilated in size with  <50% respiratory variability, suggesting right atrial pressure of 15 mmHg. Comparison(s): 03/19/21 EF 60-65%. PA pressure 20mHg. FINDINGS  Left Ventricle: Left ventricular ejection fraction, by estimation, is 55 to 60%. The left ventricle has normal function. The left ventricle has no regional wall motion abnormalities. The left ventricular internal cavity size was small. There is moderate  concentric left ventricular hypertrophy. Left ventricular diastolic function could not be evaluated due to atrial fibrillation. Left ventricular diastolic function could not be evaluated. Right Ventricle: The right ventricular size is normal. No increase in right ventricular wall thickness. Right ventricular systolic function is low normal. There is moderately elevated pulmonary artery systolic pressure. The tricuspid regurgitant velocity  is 3.54 m/s, and with an assumed right atrial pressure of 15 mmHg, the estimated right ventricular systolic pressure is 60000000mmHg. Left Atrium: Left atrial size was mildly dilated. Right Atrium: Right atrial size was normal in size. Prominent Eustachian valve. Pericardium: A small pericardial effusion is present. The pericardial effusion is circumferential. Mitral Valve: The mitral valve is grossly normal. Mild to moderate mitral valve regurgitation. Tricuspid Valve: The tricuspid valve is grossly normal. Tricuspid valve regurgitation is moderate . No evidence of tricuspid stenosis. Aortic Valve: The aortic valve is tricuspid. There is mild calcification of the aortic valve. There is mild thickening of the aortic valve. Aortic valve regurgitation is  trivial. Aortic regurgitation PHT measures 503 msec. Mild aortic valve sclerosis is present, with no evidence of aortic valve stenosis. Pulmonic Valve: The pulmonic valve was grossly normal. Pulmonic valve regurgitation is mild. No evidence of pulmonic stenosis. Aorta: The aortic root and ascending aorta are structurally normal, with no evidence of  dilitation. Venous: The inferior vena cava is dilated in size with less than 50% respiratory variability, suggesting right atrial pressure of 15 mmHg. IAS/Shunts: The atrial septum is grossly normal.  LEFT VENTRICLE PLAX 2D LVIDd:         3.00 cm LVIDs:         1.90 cm LV PW:         1.60 cm LV IVS:        1.50 cm LVOT diam:     1.90 cm LV SV:         53 LV SV Index:   35 LVOT Area:     2.84 cm  RIGHT VENTRICLE RV Basal diam:  3.10 cm RV S prime:     7.71 cm/s TAPSE (M-mode): 1.1 cm RVSP:           65.1 mmHg LEFT ATRIUM             Index       RIGHT ATRIUM            Index LA diam:        3.70 cm 2.46 cm/m  RA Pressure: 15.00 mmHg LA Vol (A2C):   62.6 ml 41.66 ml/m RA Area:     13.40 cm LA Vol (A4C):   51.3 ml 34.14 ml/m RA Volume:   34.20 ml   22.76 ml/m LA Biplane Vol: 59.4 ml 39.53 ml/m  AORTIC VALVE LVOT Vmax:   81.70 cm/s LVOT Vmean:  59.600 cm/s LVOT VTI:    0.187 m AI PHT:      503 msec  AORTA Ao Root diam: 3.40 cm Ao Asc diam:  3.40 cm MR Peak grad:    165.9 mmHg  TRICUSPID VALVE MR Mean grad:    105.0 mmHg  TR Peak grad:   50.1 mmHg MR Vmax:         644.00 cm/s TR Vmax:        354.00 cm/s MR Vmean:        493.0 cm/s  Estimated RAP:  15.00 mmHg MR PISA:         1.01 cm    RVSP:           65.1 mmHg MR PISA Eff ROA: 7 mm MR PISA Radius:  0.40 cm     SHUNTS                              Systemic VTI:  0.19 m                              Systemic Diam: 1.90 cm Eleonore Chiquito MD Electronically signed by Eleonore Chiquito MD Signature Date/Time: 05/16/2021/5:25:59 PM    Final    VAS US CAROTID (at [facility] and [facility] only)  Result Date: 06/01/2021 Carotid Arterial Duplex Study Patient Name:  EDNA ROBLEDO  Date of Exam:   06/01/2021 Medical Rec #: [id number]         Accession #:    [id number] Date of Birth: [birth year]         Patient Gender: F  Patient Age:   48 years Exam Location:  [facility] Procedure:      VAS US CAROTID Referring Phys: Gean Birchwood  --------------------------------------------------------------------------------  Indications:  CVA. Risk Factors: Hypertension, hyperlipidemia, coronary artery disease. Performing Technologist: Archie Patten RVS  Examination Guidelines: A complete evaluation includes B-mode imaging, spectral Doppler, color Doppler, and power Doppler as needed of all accessible portions of each vessel. Bilateral testing is considered an integral part of a complete examination. Limited examinations for reoccurring indications may be performed as noted.  Right Carotid Findings: +----------+--------+--------+--------+------------------+--------+           PSV cm/sEDV cm/sStenosisPlaque DescriptionComments +----------+--------+--------+--------+------------------+--------+ CCA Prox  30      8               heterogenous               +----------+--------+--------+--------+------------------+--------+ CCA Distal25      7               heterogenous               +----------+--------+--------+--------+------------------+--------+ ICA Prox  57      20      1-39%   heterogenous               +----------+--------+--------+--------+------------------+--------+ ICA Distal34      11                                         +----------+--------+--------+--------+------------------+--------+ ECA       52      6                                          +----------+--------+--------+--------+------------------+--------+ +----------+--------+-------+--------+-------------------+           PSV cm/sEDV cmsDescribeArm Pressure (mmHG) +----------+--------+-------+--------+-------------------+ [id number]                                         +----------+--------+-------+--------+-------------------+ +---------+--------+--+--------+-+---------+ VertebralPSV cm/s21EDV cm/s5Antegrade +---------+--------+--+--------+-+---------+  Left Carotid Findings:  +----------+--------+--------+--------+------------------+--------+           PSV cm/sEDV cm/sStenosisPlaque DescriptionComments +----------+--------+--------+--------+------------------+--------+ CCA Prox  31      7               heterogenous               +----------+--------+--------+--------+------------------+--------+ CCA Distal28      6               heterogenous               +----------+--------+--------+--------+------------------+--------+ ICA Prox  24      10      1-39%   heterogenous               +----------+--------+--------+--------+------------------+--------+ ICA Distal25      6                                          +----------+--------+--------+--------+------------------+--------+ ECA       44                                                 +----------+--------+--------+--------+------------------+--------+ +----------+--------+--------+--------+-------------------+  PSV cm/sEDV cm/sDescribeArm Pressure (mmHG) +----------+--------+--------+--------+-------------------+ [id number]                                          +----------+--------+--------+--------+-------------------+ +---------+--------+--+--------+-+---------+ VertebralPSV cm/s22EDV cm/s5Antegrade +---------+--------+--+--------+-+---------+   Summary: Right Carotid: Velocities in the right ICA are consistent with a 1-39% stenosis. Left Carotid: Velocities in the left ICA are consistent with a 1-39% stenosis. Vertebrals: Bilateral vertebral arteries demonstrate antegrade flow. *See table(s) above for measurements and observations.  Electronically signed by Antony Contras MD on 06/01/2021 at 5:17:25 PM.    Final     Microbiology: Recent Results (from the past 240 hour(s))  SARS CORONAVIRUS 2 (TAT 6-24 HRS) Nasopharyngeal Nasopharyngeal Swab     Status: None   Collection Time: 06/08/21 11:34 AM   Specimen: Nasopharyngeal Swab  Result Value Ref Range Status    SARS Coronavirus 2 NEGATIVE NEGATIVE Final    Comment: (NOTE) SARS-CoV-2 target nucleic acids are NOT DETECTED.  The SARS-CoV-2 RNA is generally detectable in upper and lower respiratory specimens during the acute phase of infection. Negative results do not preclude SARS-CoV-2 infection, do not rule out co-infections with other pathogens, and should not be used as the sole basis for treatment or other patient management decisions. Negative results must be combined with clinical observations, patient history, and epidemiological information. The expected result is Negative.  Fact Sheet for Patients: SugarRoll.be  Fact Sheet for Healthcare Providers: https://www.woods-mathews.com/  This test is not yet approved or cleared by the [place] FDA and  has been authorized for detection and/or diagnosis of SARS-CoV-2 by FDA under an Emergency Use Authorization (EUA). This EUA will remain  in effect (meaning this test can be used) for the duration of the COVID-19 declaration under Se ction 564(b)(1) of the Act, 21 U.S.C. section 360bbb-3(b)(1), unless the authorization is terminated or revoked sooner.  Performed at [facility], [place] [address], [place], [place] [address]      Labs: Basic Metabolic Panel: Recent Labs  Lab 06/06/21 0328 06/11/21 0948 06/11/21 1844 06/12/21 0631  NA 138 139 137 138  K 4.1 4.5 4.6 4.8  CL 107 101 103 104  CO2 24 21* 22 20*  GLUCOSE 96 199* 228* 134*  BUN 42* 83* 91* 90*  CREATININE 2.42* 4.04* 4.54* 4.49*  CALCIUM 9.5 9.1 9.1 9.2   Liver Function Tests: Recent Labs  Lab 06/12/21 0631  AST 35  ALT 33  ALKPHOS 87  BILITOT 2.2*  PROT 5.8*  ALBUMIN 3.6    No results for input(s): LIPASE, AMYLASE in the last 168 hours. No results for input(s): AMMONIA in the last 168 hours. CBC: Recent Labs  Lab 06/10/21 1123 06/12/21 0631  WBC 12.1* 11.9*  NEUTROABS 7.2  --   HGB 12.0 10.0*  HCT  37.1 31.1*  MCV 87.7 87.1  PLT 243 210   Cardiac Enzymes: No results for input(s): CKTOTAL, CKMB, CKMBINDEX, TROPONINI in the last 168 hours. BNP: BNP (last 3 results) Recent Labs    03/18/21 0038 06/10/21 1123  BNP 815.8* 1,535.1*    ProBNP (last 3 results) No results for input(s): PROBNP in the last 8760 hours.  CBG: Recent Labs  Lab 06/10/21 0821  GLUCAP 132*       Signed:  Kayleen Memos, MD [facility] 06/12/2021, 1:11 PM

## 2021-06-12 NOTE — Progress Notes (Signed)
Updated to patient's nephew who is also medical power of attorney via phone.  We will continue to provide daily updates.

## 2021-06-12 NOTE — Progress Notes (Signed)
Progress Note  Patient Name: Heather Saunders Date of Encounter: 06/12/2021  [facility] Cardiologist: Dorris Carnes, MD   Subjective   She remains confused, sitting in chair eating breakfast, unaware of what's going on. She denied having any pain of her chest. RN reports she is incontinent of urine, which is difficulty for [facility] records. She is not on oxygen support nor having difficulty with breathing.   Inpatient Medications    Scheduled Meds:   stroke: mapping our early stages of recovery book   Does not apply Once   apixaban  2.5 mg Oral BID   aspirin  81 mg Oral Daily   atorvastatin  40 mg Oral Daily   diltiazem  60 mg Oral Q8H   ezetimibe  10 mg Oral Daily   feeding supplement  237 mL Oral BID BM   hydrALAZINE  50 mg Oral Q8H   metoprolol tartrate  100 mg Oral BID   multivitamin with minerals  1 tablet Oral Daily   senna-docusate  2 tablet Oral BID   Continuous Infusions:  PRN Meds: acetaminophen **OR** acetaminophen (TYLENOL) oral liquid 160 mg/5 mL **OR** acetaminophen, bisacodyl, labetalol, polyethylene glycol   Vital Signs    Vitals:   06/12/21 0016 06/12/21 0500 06/12/21 0540 06/12/21 0810  BP: (!) 165/84  (!) 165/81 (!) 164/91  Pulse: 87  90 96  Resp: '20  20 19  '$ Temp: 97.7 F (36.5 C)  97.8 F (36.6 C) 97.7 F (36.5 C)  TempSrc: Axillary  Axillary Axillary  SpO2: 99%  96% 96%  Weight:  52 kg    Height:        Intake/Output Summary (Last 24 hours) at 06/12/2021 1001 Last data filed at 06/11/2021 1700 Gross per 24 hour  Intake 437 ml  Output 100 ml  Net 337 ml   Last 3 Weights 06/12/2021 06/11/2021 06/01/2021  Weight (lbs) 114 lb 10.2 oz 109 lb 5.6 oz 109 lb  Weight (kg) 52 kg 49.6 kg 49.442 kg      Telemetry    A fib with rate of 80 -117s, occasional PVCs, artifacts  - Personally Reviewed  ECG    N/A this AM  - Personally Reviewed  Physical Exam   GEN: No acute distress.  Sitting in chair.  Neck: 12-13 CM JVD while sitting  Cardiac:  Irregularly irregular, no murmurs, rubs, or gallops.  Respiratory: Bilateral rales  GI: Soft, nontender, non-distended  MS: No edema; No deformity. Neuro:  Confused, cognitive impaired, difficulty following simple commands, answered yes and no questions but not appropriately  Psych: calm   Labs    High Sensitivity Troponin:  No results for input(s): TROPONINIHS in the last 720 hours.    Chemistry Recent Labs  Lab 06/11/21 0948 06/11/21 1844 06/12/21 0631  NA 139 137 138  K 4.5 4.6 4.8  CL 101 103 104  CO2 21* 22 20*  GLUCOSE 199* 228* 134*  BUN 83* 91* 90*  CREATININE 4.04* 4.54* 4.49*  CALCIUM 9.1 9.1 9.2  PROT  --   --  5.8*  ALBUMIN  --   --  3.6  AST  --   --  35  ALT  --   --  33  ALKPHOS  --   --  87  BILITOT  --   --  2.2*  GFRNONAA 10* 8* 9*  ANIONGAP 17* 12 14     Hematology Recent Labs  Lab 06/10/21 1123 06/12/21 0631  WBC 12.1* 11.9*  RBC  4.23 3.57*  HGB 12.0 10.0*  HCT 37.1 31.1*  MCV 87.7 87.1  MCH 28.4 28.0  MCHC 32.3 32.2  RDW 15.3 15.5  PLT 243 210    BNP Recent Labs  Lab 06/10/21 1123  BNP 1,535.1*     DDimer No results for input(s): DDIMER in the last 168 hours.   Radiology    No results found.  Cardiac Studies    Echo from 06/01/21 :   1. Left ventricular ejection fraction, by estimation, is 55 to 60%. The  left ventricle has normal function. The left ventricle has no regional  wall motion abnormalities. Left ventricular diastolic parameters are  consistent with Grade I diastolic  dysfunction (impaired relaxation).   2. Right ventricular systolic function is mildly reduced. The right  ventricular size is normal. There is mildly elevated pulmonary artery  systolic pressure.   3. The mitral valve is grossly normal. No evidence of mitral valve  regurgitation.   4. The aortic valve is tricuspid. Aortic valve regurgitation is trivial.  Aortic valve sclerosis is present, with no evidence of aortic valve  stenosis.   5. The  inferior vena cava is dilated in size with >50% respiratory  variability, suggesting right atrial pressure of 8 mmHg.   6. Tricuspid valve regurgitation is mild to moderate.   Comparison(s): A prior study was performed on 05/16/21. Prior RVSP 65 mm  Hg.    Patient Profile     Ms. Heather Saunders is a 87 year old female with history of persistent atrial fibrillation, diastolic heart failure, pulmonary hypertension, poorly controlled hypertension, CKD stage IV who was admitted on 05/31/2021 with acute lacunar infarct.  Cardiology was consulted on 06/11/2021 due to worsening congestive heart failure.   Assessment & Plan     #Acute on chronic diastolic heart failure #Pulmonary hypertension #AKI on CKD stage IV -She is grossly volume overloaded.  BNP elevated 1535.  A. fib rate controlled. -She has received IV Lasix and creatinine is rising and urine output is declining. Lasix held.  - consider work up for UTI per IM  -Regarding her diastolic heart failure she has very limited options.  As her kidney function continues to decline we are having difficulty removing fluid.  Nephrology consulted, does not feel patient is good candidate for RRT which we agree given her advanced age and comorbidity. Lasix and albumin started per nephrology to trial diuresis, if inadequate response transition to comfort care should be discussed with patient's health care proxy   #Uncontrolled hypertension -Likely worsened by volume overload. -will further increase hydralazine to 75 mg 3 times daily today  - diuresis per nephrology  - Continue Cardizem 60 mg twice daily and metoprolol tartrate 100 mg twice daily.   #Acute left caudate stroke/lacunar infarct -Small vessel stroke related to uncontrolled hypertension.  Not related to A. fib.  Continue aspirin and statin per neurology recommendation.   #Persistent atrial fibrillation #Hx of right atrium thrombus  -Stroke not related to A. fib as this is a lacunar infarct.  Would  continue Eliquis 2.5 mg twice daily. -Continue rate control agents.   Acute metabolic encephalopathy Debility  - managed per IM     For questions or updates, please contact [facility] Please consult [email or web address] for contact info under        Signed, Margie Billet, NP  06/12/2021, 10:01 AM

## 2021-06-12 NOTE — Progress Notes (Signed)
Occupational Therapy Treatment Patient Details Name: Heather Saunders MRN: [id number] DOB: [birth year] Today's Date: 06/12/2021   History of present illness 87 yr old female who presented due to change in cognition. Pt found to have an acute left caudate nucleus CVA.  PMH but to limted to: pulmonary HTN, HTNN, afib, CKD, chronic diastolic congestive heart faluire   OT comments  Pt making gradual progress towards OT goals this session. Session focus on BADL reeducation and cognitive retraining. Pt continues to present with impaired cognition, decreased activity tolerance and generalized deconditioning. Session limited by decreased level of arousal. Pt disoriented to location and situation with poor awareness noted to be saturated in urine with no awareness to incontinence. Pt currently requires total A for LB ADLS. Pt would continue to benefit from skilled occupational therapy while admitted and after d/c to address the below listed limitations in order to improve overall functional mobility and facilitate independence with BADL participation. DC plan remains appropriate, will follow acutely per POC.      Recommendations for follow up therapy are one component of a multi-disciplinary discharge planning process, led by the attending physician.  Recommendations may be updated based on patient status, additional functional criteria and insurance authorization.    Follow Up Recommendations  SNF;Supervision/Assistance - 24 hour    Equipment Recommendations  Other (comment) (defer to next level of care)    Recommendations for Other Services      Precautions / Restrictions Precautions Precautions: Fall Precaution Comments: confused and high fall risk Restrictions Weight Bearing Restrictions: No       Mobility Bed Mobility Overal bed mobility: Needs Assistance Bed Mobility: Rolling Rolling: Min guard         General bed mobility comments: min guard with max multimodal cues to sequence  mobility tasks    Transfers                 General transfer comment: deferred d/t level of arousal    Balance                                           ADL either performed or assessed with clinical judgement   ADL Overall ADL's : Needs assistance/impaired             Lower Body Bathing: Total assistance;Bed level Lower Body Bathing Details (indicate cue type and reason): simulated via posterior pericare             Toileting- Clothing Manipulation and Hygiene: Total assistance;Bed level Toileting - Clothing Manipulation Details (indicate cue type and reason): from bed level     Functional mobility during ADLs: Minimal assistance (bed mobility) General ADL Comments: pt continues to present with impaired cognition, decreased activity tolerance and generalized deconditioning     Vision       Perception     Praxis      Cognition Arousal/Alertness: Lethargic Behavior During Therapy: Flat affect Overall Cognitive Status: Difficult to assess Area of Impairment: Orientation;Attention;Memory;Following commands;Safety/judgement;Awareness;Problem solving                 Orientation Level: Disoriented to;Place;Time;Situation (pt unable to state location even with choices provided of "grocery store, church or hopsital" put unaware that she was in hospital for a CVA) Current Attention Level: Focused Memory: Decreased short-term memory (repeated cues for mobility tasks) Following Commands: Follows one step commands inconsistently;Follows one step commands with  increased time Safety/Judgement: Decreased awareness of safety;Decreased awareness of deficits Awareness: Intellectual Problem Solving: Slow processing;Decreased initiation;Difficulty sequencing;Requires verbal cues;Requires tactile cues General Comments: pt limited by lethargy during session needing max multimodal cues to follow commands        Exercises     Shoulder  Instructions       General Comments pt noted to saturated in urine with no awareness    Pertinent Vitals/ Pain       Pain Assessment: Faces Faces Pain Scale: No hurt  Home Living                                          Prior Functioning/Environment              Frequency  Min 2X/week        Progress Toward Goals  OT Goals(current goals can now be found in the care plan section)  Progress towards OT goals: Not progressing toward goals - comment (decreased level of arousal)  Acute Rehab OT Goals Patient Stated Goal: none stated Time For Goal Achievement: 06/16/21 Potential to Achieve Goals: [place] Discharge plan remains appropriate;Frequency remains appropriate    Co-evaluation                 AM-PAC OT "6 Clicks" Daily Activity     Outcome Measure   Help from another person eating meals?: None Help from another person taking care of personal grooming?: A Little Help from another person toileting, which includes using toliet, bedpan, or urinal?: A Lot Help from another person bathing (including washing, rinsing, drying)?: A Lot Help from another person to put on and taking off regular upper body clothing?: A Lot Help from another person to put on and taking off regular lower body clothing?: Total 6 Click Score: 14    End of Session    OT Visit Diagnosis: Unsteadiness on feet (R26.81);Other abnormalities of gait and mobility (R26.89);Muscle weakness (generalized) (M62.81)   Activity Tolerance Patient limited by lethargy   Patient Left in bed;with call bell/phone within reach;with bed alarm set   Nurse Communication Mobility status;Other (comment) (cleaned pt up and placed new bed pads)        Time: [id number] OT Time Calculation (min): 11 min  Charges: OT General Charges $OT Visit: 1 Visit OT Treatments $Self Care/Home Management : 8-22 mins  [name], COTA/L Acute Rehabilitation  Services [phone]   [name] 06/12/2021, 3:08 PM

## 2021-06-12 NOTE — Progress Notes (Signed)
[facility] Progress Note    Assessment/ Plan:   1.  Acute kidney injury on chronic kidney disease stage IV: This appears to be likely hemodynamically mediated in the setting of congestive heart failure with decreased effective arterial blood volume and possibly exacerbated by diuretic use/labile blood pressures. -UA pending, reordered -renal u/s -Seems that there was no significant response to lasix + albumin, Cr is flat today. recommend pall care consult. Given her advanced age, comobiridities, recent CVA,  would not recommend renal replacement therapy as this will likely worsen her quality of life and hasten mortality -will hold on further diuretics for today  -Avoid nephrotoxic medications including NSAIDs and iodinated intravenous contrast exposure unless the latter is absolutely indicated.  Preferred narcotic agents for pain control are hydromorphone, fentanyl, and methadone. Morphine should not be used. Avoid Baclofen and avoid oral sodium phosphate and magnesium citrate based laxatives / bowel preps. Continue strict Input and Output monitoring. Will monitor the patient closely with you and intervene or adjust therapy as indicated by changes in clinical status/labs. 2.  Acute exacerbation of diastolic heart failure: Poor response to diuretics, no significant response to a combination of albumin and furosemide to see if we can promote some diuresis.  Overall prognosis appears poor if renal function does not improve. Will hold on further doses for today, recommend having a low threshold to give diuretics if required 3.  Hypertension: Remains poorly controlled on hydralazine (just uptitrated by cardiology), diltiazem and metoprolol and will attempt diuresis. 4.  Confusion/deconditioning: Secondary to acute left caudate infarct.  Attempting blood pressure control with ongoing anticoagulation with Eliquis. 5.Metabolic acidosis -secondary to AKI, monitoring for now, if lower the  nwill start nahco3 supplementation  Subjective:   Uop ~400cc only charted but nothing charted from overnight. Cr 4.5 today, ua not done yet. Per staff, she is incontinent, currently on RA. Has a great appetite as per staff. She has been confused. No acute events.   Objective:   BP (!) 164/91 (BP Location: Left Arm)   Pulse 96   Temp 97.7 F (36.5 C) (Axillary)   Resp 19   Ht 5' 3.5" (1.613 m)   Wt 52 kg   SpO2 96%   BMI 19.99 kg/m   Intake/Output Summary (Last 24 hours) at 06/12/2021 1006 Last data filed at 06/11/2021 1700 Gross per 24 hour  Intake 437 ml  Output 100 ml  Net 337 ml   Weight change: 2.4 kg  Physical Exam: Gen:ill appearing CVS:rrr Resp:decreased breath sounds bibasilar, normal wob [id number] Ext:no sig edema Neuro: confused, following commands, awake, alert  Imaging: No results found.  Labs: BMET Recent Labs  Lab 06/06/21 0328 06/11/21 0948 06/11/21 1844 06/12/21 0631  NA 138 139 137 138  K 4.1 4.5 4.6 4.8  CL 107 101 103 104  CO2 24 21* 22 20*  GLUCOSE 96 199* 228* 134*  BUN 42* 83* 91* 90*  CREATININE 2.42* 4.04* 4.54* 4.49*  CALCIUM 9.5 9.1 9.1 9.2   CBC Recent Labs  Lab 06/10/21 1123 06/12/21 0631  WBC 12.1* 11.9*  NEUTROABS 7.2  --   HGB 12.0 10.0*  HCT 37.1 31.1*  MCV 87.7 87.1  PLT 243 210    Medications:      stroke: mapping our early stages of recovery book   Does not apply Once   apixaban  2.5 mg Oral BID   aspirin  81 mg Oral Daily   atorvastatin  40 mg Oral Daily   diltiazem  60 mg Oral Q8H   ezetimibe  10 mg Oral Daily   feeding supplement  237 mL Oral BID BM   hydrALAZINE  50 mg Oral Q8H   metoprolol tartrate  100 mg Oral BID   multivitamin with minerals  1 tablet Oral Daily   senna-docusate  2 tablet Oral BID      Gean Quint, MD [facility] 06/12/2021, 10:06 AM

## 2021-06-12 NOTE — Telephone Encounter (Signed)
Heather Saunders from [place] at [facility] states they received a call from the patient's nephew and POA, Heather Saunders. She states the patient is in the hospital, but never received the results for her echo. She would like someone to contact the patient's nephew Phone: [phone]

## 2021-06-12 NOTE — Progress Notes (Signed)
  Speech Language Pathology Treatment: Cognitive-Linquistic  Patient Details Name: Heather Saunders MRN: [id number] DOB: [birth year] Today's Date: 06/12/2021 Time: [id number] SLP Time Calculation (min) (ACUTE ONLY): 15 min  Assessment / Plan / Recommendation Clinical Impression  SLP followed up for cognitive linguistic intervention. Pts cognition remains globally impaired, pt attempting to get out of bed upon SLP arrival with poor safety awareness and insight, pt assisted back in bed and bed alarm turned on. Pt disoriented x4. Provided verbal reinforcement and visual aids to assist orientation to environment however pt exhibits poor recall. 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 Pt will require 24 hour care at DC. All further ST needs can be addressed at next level of care. Recommend SNF.     HPI HPI: 87 y.o. female presents to [facility] ED on 05/31/2021 with confusion and slurred speech. MRI demonstrates small acute infarct of the left caudate nucleus. PMH includes atrial fibrillation, pulmonary hypertension, chronic kidney disease stage IV, anemia      SLP Plan  Other (Comment) (all further ST needs can be addressed at next level of care)       Recommendations  Diet recommendations: Dysphagia 3 (mechanical soft);Thin liquid Liquids provided via: Cup;Straw Medication Administration: Whole meds with liquid Supervision: Full supervision/cueing for compensatory strategies;Staff to assist with self feeding Compensations: Minimize environmental distractions;Slow rate Postural Changes and/or Swallow Maneuvers: Seated upright 90 degrees;Upright 30-60 min after meal                Oral Care Recommendations: Oral care BID Follow up Recommendations: Skilled Nursing facility SLP Visit Diagnosis: Cognitive communication deficit [id number]) Plan: Other (Comment) (all further ST needs can be addressed at next level of care)       GO                Heather Saunders,  CCC-SLP Acute Rehabilitation Services   06/12/2021, 2:39 PM

## 2021-06-13 DIAGNOSIS — E43 Unspecified severe protein-calorie malnutrition: Secondary | ICD-10-CM | POA: Diagnosis not present

## 2021-06-13 DIAGNOSIS — R531 Weakness: Secondary | ICD-10-CM

## 2021-06-13 DIAGNOSIS — G9340 Encephalopathy, unspecified: Secondary | ICD-10-CM | POA: Diagnosis not present

## 2021-06-13 DIAGNOSIS — N184 Chronic kidney disease, stage 4 (severe): Secondary | ICD-10-CM | POA: Diagnosis not present

## 2021-06-13 DIAGNOSIS — I4821 Permanent atrial fibrillation: Secondary | ICD-10-CM

## 2021-06-13 DIAGNOSIS — I63312 Cerebral infarction due to thrombosis of left middle cerebral artery: Secondary | ICD-10-CM | POA: Diagnosis not present

## 2021-06-13 DIAGNOSIS — Z515 Encounter for palliative care: Secondary | ICD-10-CM | POA: Diagnosis not present

## 2021-06-13 LAB — SODIUM, URINE, RANDOM: Sodium, Ur: 39 mmol/L

## 2021-06-13 LAB — CREATININE, URINE, RANDOM: Creatinine, Urine: 79.26 mg/dL

## 2021-06-13 LAB — URINALYSIS, ROUTINE W REFLEX MICROSCOPIC
Bacteria, UA: NONE SEEN
Bilirubin Urine: NEGATIVE
Glucose, UA: NEGATIVE mg/dL
Hgb urine dipstick: NEGATIVE
Ketones, ur: NEGATIVE mg/dL
Leukocytes,Ua: NEGATIVE
Nitrite: NEGATIVE
Protein, ur: 100 mg/dL — AB
Specific Gravity, Urine: 1.014 (ref 1.005–1.030)
pH: 5 (ref 5.0–8.0)

## 2021-06-13 LAB — CBC
HCT: 31.7 % — ABNORMAL LOW (ref 36.0–46.0)
Hemoglobin: 10.2 g/dL — ABNORMAL LOW (ref 12.0–15.0)
MCH: 27.8 pg (ref 26.0–34.0)
MCHC: 32.2 g/dL (ref 30.0–36.0)
MCV: 86.4 fL (ref 80.0–100.0)
Platelets: 250 10*3/uL (ref 150–400)
RBC: 3.67 MIL/uL — ABNORMAL LOW (ref 3.87–5.11)
RDW: 15.5 % (ref 11.5–15.5)
WBC: 16 10*3/uL — ABNORMAL HIGH (ref 4.0–10.5)
nRBC: 0 % (ref 0.0–0.2)

## 2021-06-13 LAB — RENAL FUNCTION PANEL
Albumin: 3.5 g/dL (ref 3.5–5.0)
Anion gap: 15 (ref 5–15)
BUN: 98 mg/dL — ABNORMAL HIGH (ref 8–23)
CO2: 20 mmol/L — ABNORMAL LOW (ref 22–32)
Calcium: 9.3 mg/dL (ref 8.9–10.3)
Chloride: 102 mmol/L (ref 98–111)
Creatinine, Ser: 4.88 mg/dL — ABNORMAL HIGH (ref 0.44–1.00)
GFR, Estimated: 8 mL/min — ABNORMAL LOW (ref >60)
Glucose, Bld: 142 mg/dL — ABNORMAL HIGH (ref 70–99)
Phosphorus: 6.4 mg/dL — ABNORMAL HIGH (ref 2.5–4.6)
Potassium: 5.1 mmol/L (ref 3.5–5.1)
Sodium: 137 mmol/L (ref 135–145)

## 2021-06-13 MED ORDER — FUROSEMIDE 10 MG/ML IJ SOLN
80.0000 mg | Freq: Once | INTRAMUSCULAR | Status: AC
  Administered 2021-06-13: 80 mg via INTRAVENOUS
  Filled 2021-06-13: qty 8

## 2021-06-13 MED ORDER — ALBUMIN HUMAN 25 % IV SOLN
25.0000 g | Freq: Once | INTRAVENOUS | Status: AC
  Administered 2021-06-13: 25 g via INTRAVENOUS
  Filled 2021-06-13: qty 100

## 2021-06-13 NOTE — Progress Notes (Signed)
Nutrition Follow-up  DOCUMENTATION CODES:   Severe malnutrition in context of chronic illness  INTERVENTION:   -Continue Ensure Enlive po BID, each supplement provides 350 kcal and 20 grams of protein  -Continue Magic cup TID with meals, each supplement provides 290 kcal and 9 grams of protein  -Continue MVI with minerals daily  NUTRITION DIAGNOSIS:   Severe Malnutrition related to chronic illness (CKD stage 4) as evidenced by severe fat depletion, severe muscle depletion, percent weight loss.  Ongoing  GOAL:   Patient will meet greater than or equal to 90% of their needs  Progressing   MONITOR:   PO intake, Supplement acceptance, Labs, Weight trends, I & O's  REASON FOR ASSESSMENT:   Malnutrition Screening Tool    ASSESSMENT:   87 yo female with a PMH of A. fib, pulmonary hypertension, chronic kidney disease stage IV, anemia, presented to the ED with confusion as noted by home health aide with some associated slurred speech. Work-up in the ED with head CT being unremarkable, MRI head consistent with acute CVA. Patient admitted for stroke work-up.  Reviewed I/O's: +240 ml x 24 hours and +1.9 L since admission  Pt working with therapy at time of visit.   Pt with fair appetite. Noted meal completion 25-50%. She is consuming Ensure Enlive supplements.   Per TOC notes, pt is medically stable for discharge and awaiting SNF placement.   Medications reviewed and include cardizem and senokot.   Labs reviewed: Phos: 6.4.  Diet Order:   Diet Order             DIET DYS 3 Room service appropriate? No; Fluid consistency: Thin  Diet effective now                   EDUCATION NEEDS:   Not appropriate for education at this time  Skin:  Skin Assessment: Skin Integrity Issues: Skin Integrity Issues:: Stage I Stage I: buttocks  Last BM:  06/12/21  Height:   Ht Readings from Last 1 Encounters:  06/01/21 5' 3.5" (1.613 m)    Weight:   Wt Readings from Last 1  Encounters:  06/12/21 52 kg   BMI:  Body mass index is 19.99 kg/m.  Estimated Nutritional Needs:   Kcal:  1600-1800  Protein:  65-80 grams  Fluid:  >1.6 L    Loistine Chance, RD, LDN, [place] Registered Dietitian II Certified Diabetes Care and Education Specialist Please refer to [facility] for RD and/or RD on-call/weekend/after hours pager

## 2021-06-13 NOTE — Progress Notes (Signed)
Tele called RN and stated that the patient had a bradycardic moment HR in the 30's for short time. Pt also had a Pause of 2.63 seconds. Will let MD know

## 2021-06-13 NOTE — Progress Notes (Signed)
Physical Therapy Treatment Patient Details Name: Heather Saunders MRN: [id number] DOB: [birth year] Today's Date: 06/13/2021   History of Present Illness 87 yr old female who presented due to change in cognition. Pt found to have an acute left caudate nucleus CVA.  PMH but to limted to: pulmonary HTN, HTNN, afib, CKD, chronic diastolic congestive heart faluire    PT Comments    Pt tolerates treatment well with improved activity tolerance. Pt is able to ambulate for increased distances and demonstrates improvement in balance when ambulating. Pt does continue to demonstrate significant cognitive deficits, requiring multiple verbal and tactile cues to follow commands. Pt with poor direction, walking past room despite PT cues for room number and PT pointing out room as pt passes by. Pt will benefit from continued acute PT services to reduce falls risk and attempt to improve safety awareness.   Recommendations for follow up therapy are one component of a multi-disciplinary discharge planning process, led by the attending physician.  Recommendations may be updated based on patient status, additional functional criteria and insurance authorization.  Follow Up Recommendations  SNF     Equipment Recommendations  Rolling walker with 5" wheels;3in1 (PT);Wheelchair (measurements PT)    Recommendations for Other Services       Precautions / Restrictions Precautions Precautions: Fall Precaution Comments: confused and high fall risk Restrictions Weight Bearing Restrictions: No     Mobility  Bed Mobility Overal bed mobility: Needs Assistance Bed Mobility: Supine to Sit     Supine to sit: Min assist;HOB elevated          Transfers Overall transfer level: Needs assistance Equipment used: Rolling walker (2 wheeled) Transfers: Sit to/from Stand Sit to Stand: Min assist         General transfer comment: tactile cueing for hand placement, physical assist to encourage anterior trunk lean  and to power up into standing  Ambulation/Gait Ambulation/Gait assistance: Min guard Gait Distance (Feet): 80 Feet (80' x 2 trials) Assistive device: Rolling walker (2 wheeled) Gait Pattern/deviations: Step-to pattern Gait velocity: reduced Gait velocity interpretation: <1.8 ft/sec, indicate of risk for recurrent falls General Gait Details: pt with slowed step-to gait   Stairs             Wheelchair Mobility    Modified Rankin (Stroke Patients Only) Modified Rankin (Stroke Patients Only) Pre-Morbid Rankin Score: No significant disability Modified Rankin: Moderately severe disability     Balance Overall balance assessment: Needs assistance Sitting-balance support: No upper extremity supported;Feet supported Sitting balance-Leahy Scale: Fair     Standing balance support: Bilateral upper extremity supported Standing balance-Leahy Scale: Poor Standing balance comment: reliant on UE support                            Cognition Arousal/Alertness: Awake/alert;Lethargic (initially lethargic, awakens once beginning mobility) Behavior During Therapy: Flat affect Overall Cognitive Status: Impaired/Different from baseline Area of Impairment: Orientation;Attention;Memory;Following commands;Safety/judgement;Awareness;Problem solving                 Orientation Level: Disoriented to;Place;Time;Situation Current Attention Level: Focused Memory: Decreased recall of precautions;Decreased short-term memory Following Commands: Follows one step commands with increased time Safety/Judgement: Decreased awareness of safety;Decreased awareness of deficits Awareness: Intellectual Problem Solving: Slow processing;Difficulty sequencing;Requires verbal cues;Requires tactile cues;Decreased initiation        Exercises      General Comments General comments (skin integrity, edema, etc.): O2 sats and HR stable. Pt does demonstrate intermittent tachypnea during session  Pertinent Vitals/Pain Pain Assessment: Faces Faces Pain Scale: No hurt    Home Living                      Prior Function            PT Goals (current goals can now be found in the care plan section) Acute Rehab PT Goals Patient Stated Goal: none stated Progress towards PT goals: Progressing toward goals    Frequency    Min 3X/week      PT Plan Current plan remains appropriate    Co-evaluation              AM-PAC PT "6 Clicks" Mobility   Outcome Measure  Help needed turning from your back to your side while in a flat bed without using bedrails?: A Little Help needed moving from lying on your back to sitting on the side of a flat bed without using bedrails?: A Little Help needed moving to and from a bed to a chair (including a wheelchair)?: A Little Help needed standing up from a chair using your arms (e.g., wheelchair or bedside chair)?: A Little Help needed to walk in hospital room?: A Little Help needed climbing 3-5 steps with a railing? : A Lot 6 Click Score: 17    End of Session   Activity Tolerance: Patient tolerated treatment well Patient left: in chair;with call bell/phone within reach;with chair alarm set Nurse Communication: Mobility status PT Visit Diagnosis: Other abnormalities of gait and mobility (R26.89);Muscle weakness (generalized) (M62.81);Other symptoms and signs involving the nervous system (R29.898)     Time: [id number] PT Time Calculation (min) (ACUTE ONLY): 28 min  Charges:  $Gait Training: 23-37 mins                     Zenaida Niece, PT, DPT Acute Rehabilitation Pager: [phone]    Zenaida Niece 06/13/2021, 1:05 PM

## 2021-06-13 NOTE — TOC Progression Note (Signed)
Transition of Care [facility]) - Progression Note    Patient Details  Name: Heather Saunders MRN: [id number] Date of Birth: [birth year]  [facility] CM/SW Contact  Emeterio Reeve, [employer] Phone Number: 06/13/2021, 2:20 PM  Clinical Narrative:     [facility] requested updated PT/OT notes for insurance. CSW sent over in the hub. Insurance auth still pending at this time. CSW will continue to follow for updates.   Expected Discharge Plan: Home/Self Care Barriers to Discharge: Other (must enter comment)  Expected Discharge Plan and Services Expected Discharge Plan: Home/Self Care     Post Acute Care Choice: NA   Expected Discharge Date: 06/08/21                                     Social Determinants of Health (SDOH) Interventions    Readmission Risk Interventions No flowsheet data found.  Emeterio Reeve, LCSW Clinical Social Worker

## 2021-06-13 NOTE — Progress Notes (Signed)
PROGRESS NOTE    Kaite Snyder  [id number] DOB: Dec 21, 1924 DOA: 05/31/2021 PCP: Kathyrn Lass, MD    Brief Narrative:  87 year old female with history of chronic A. fib on Eliquis, pulmonary hypertension, chronic kidney disease stage IV, anemia of chronic disease and debility brought to emergency room with confusion as noted by home health aide and also noted to have slurred speech.  Work-up in the emergency room with head CT unremarkable.  MRI consistent with acute small vessel disease stroke.  Hospital course complicated with persistent weakness, uncontrolled hypertension.  Acute kidney injury with underlying chronic kidney disease.   Assessment & Plan:   Principal Problem:   CVA (cerebral vascular accident) ([place]) Active Problems:   Essential hypertension   Pulmonary HTN ([place])   Acute encephalopathy   Atrial fibrillation (HCC)   CKD (chronic kidney disease), stage IV (HCC)   Chronic diastolic congestive heart failure (HCC)   Protein-calorie malnutrition, severe   Pressure injury of skin  Acute left caudate nucleus CVA with acute metabolic encephalopathy: Presented with confusion and slurred speech. MRI/MRA of the head consistent with a small acute infarct left carotid nucleus, no hemorrhage or mass-effect.  This was thought to be small vessel disease. Patient already on Eliquis for anticoagulation for chronic A. fib, aspirin was added. 2D echocardiogram with normal EF.  No source of emboli. Carotid Dopplers with no significant ICA stenosis. Neurology recommended to continue Eliquis and add aspirin for further stroke prevention. Started on dysphagia 3 diet. Outpatient follow-up with neurology.  Acute kidney injury on chronic kidney disease stage IV: Reported baseline creatinine 1.8.  She was treated with Lasix, renal functions deteriorated and creatinine creeping up.  4.8 today. Seen by nephrology.  Challenged with albumin and Lasix without improvement. Monitor intake and  output. She is not a candidate for hemodialysis, will continue conservative management.  Acute on chronic diastolic congestive heart failure: Intolerance to diuretics.  Currently euvolemic.  Essential hypertension, uncontrolled: Increase home dose of Cardizem to 180 mg daily.  Metoprolol discontinued today.  Continue hydralazine 50 mg 3 times a day.  Chronic A. fib with bradycardia: Noted heart rate less than 40 with pause of 2.6 seconds.  Patient is asymptomatic.  We will discontinue beta-blockers and monitor.  She is not interventional candidate or pacemaker candidate.  Goal of care: Palliative consultation 9/11, DNR Palliative consultation 9/14, her nephew is her healthcare power of attorney.  Patient lives alone at home.  Patient with multiple comorbidities, now with worsening renal functions and debility. Plan is to discharge to a skilled nursing facility for attempted rehab with palliative care follow-up.  She may ultimately need hospice if no appropriate improvement.    DVT prophylaxis: apixaban (ELIQUIS) tablet 2.5 mg Start: 06/02/21 1030 Place and maintain sequential compression device Start: 05/31/21 2137 apixaban (ELIQUIS) tablet 2.5 mg   Code Status: DNR Family Communication: Family by palliative Disposition Plan: Status is: Inpatient  Remains inpatient appropriate because:Inpatient level of care appropriate due to severity of illness  Dispo:  Patient From: Home  Planned Disposition: [place]  Medically stable for discharge: No           Consultants:  Palliative team Nephrology Cardiology Neurology  Procedures:  None  Antimicrobials:  None   Subjective: Patient seen and examined.  Poor historian.  Cannot tell me anything.  She says she is fine.  Mostly sleepy.  Objective: Vitals:   06/12/21 1715 06/12/21 2244 06/13/21 0407 06/13/21 1248  BP: (!) 149/102 (!) 151/103 137/86 Marland Kitchen)  146/92  Pulse: (!) 103 92 87 81  Resp: '18 20 19 16   '$ Temp: (!) 97.5 F (36.4 C) 97.6 F (36.4 C) 97.7 F (36.5 C) (!) 97.5 F (36.4 C)  TempSrc: Oral Axillary Axillary Oral  SpO2: 95% 92% 94% 93%  Weight:      Height:        Intake/Output Summary (Last 24 hours) at 06/13/2021 1504 Last data filed at 06/13/2021 1503 Gross per 24 hour  Intake 340 ml  Output --  Net 340 ml   Filed Weights   06/01/21 0000 06/11/21 0623 06/12/21 0500  Weight: 49.4 kg 49.6 kg 52 kg    Examination:  General: Frail and debilitated.  Chronically sick looking.  Mostly sleepy and lethargic.  Not able to keep up with much communication. Cardiovascular: S1-S2 normal.  Irregularly irregular. Respiratory: Bilateral clear.  No added sounds. Gastrointestinal: Soft.  Nontender.  Bowel sounds present. Ext: No edema or cyanosis. Neuro: Alert on stimulation.  Not oriented.  Generalized weakness.  No focal deficits.      Data Reviewed: I have personally reviewed following labs and imaging studies  CBC: Recent Labs  Lab 06/10/21 1123 06/12/21 0631 06/13/21 0225  WBC 12.1* 11.9* 16.0*  NEUTROABS 7.2  --   --   HGB 12.0 10.0* 10.2*  HCT 37.1 31.1* 31.7*  MCV 87.7 87.1 86.4  PLT 243 210 [id number]   Basic Metabolic Panel: Recent Labs  Lab 06/11/21 0948 06/11/21 1844 06/12/21 0631 06/13/21 0225  NA 139 137 138 137  K 4.5 4.6 4.8 5.1  CL 101 103 104 102  CO2 21* 22 20* 20*  GLUCOSE 199* 228* 134* 142*  BUN 83* 91* 90* 98*  CREATININE 4.04* 4.54* 4.49* 4.88*  CALCIUM 9.1 9.1 9.2 9.3  PHOS  --   --   --  6.4*   GFR: Estimated Creatinine Clearance: 5.8 mL/min (A) (by C-G formula based on SCr of 4.88 mg/dL (H)). Liver Function Tests: Recent Labs  Lab 06/12/21 0631 06/13/21 0225  AST 35  --   ALT 33  --   ALKPHOS 87  --   BILITOT 2.2*  --   PROT 5.8*  --   ALBUMIN 3.6 3.5   No results for input(s): LIPASE, AMYLASE in the last 168 hours. No results for input(s): AMMONIA in the last 168 hours. Coagulation Profile: No results for input(s):  INR, PROTIME in the last 168 hours. Cardiac Enzymes: No results for input(s): CKTOTAL, CKMB, CKMBINDEX, TROPONINI in the last 168 hours. BNP (last 3 results) No results for input(s): PROBNP in the last 8760 hours. HbA1C: No results for input(s): HGBA1C in the last 72 hours. CBG: Recent Labs  Lab 06/10/21 0821  GLUCAP 132*   Lipid Profile: No results for input(s): CHOL, HDL, LDLCALC, TRIG, CHOLHDL, LDLDIRECT in the last 72 hours. Thyroid Function Tests: No results for input(s): TSH, T4TOTAL, FREET4, T3FREE, THYROIDAB in the last 72 hours. Anemia Panel: No results for input(s): VITAMINB12, FOLATE, FERRITIN, TIBC, IRON, RETICCTPCT in the last 72 hours. Sepsis Labs: Recent Labs  Lab 06/10/21 1123  PROCALCITON 0.32    Recent Results (from the past 240 hour(s))  SARS CORONAVIRUS 2 (TAT 6-24 HRS) Nasopharyngeal Nasopharyngeal Swab     Status: None   Collection Time: 06/08/21 11:34 AM   Specimen: Nasopharyngeal Swab  Result Value Ref Range Status   SARS Coronavirus 2 NEGATIVE NEGATIVE Final    Comment: (NOTE) SARS-CoV-2 target nucleic acids are NOT DETECTED.  The SARS-CoV-2 RNA  is generally detectable in upper and lower respiratory specimens during the acute phase of infection. Negative results do not preclude SARS-CoV-2 infection, do not rule out co-infections with other pathogens, and should not be used as the sole basis for treatment or other patient management decisions. Negative results must be combined with clinical observations, patient history, and epidemiological information. The expected result is Negative.  Fact Sheet for Patients: SugarRoll.be  Fact Sheet for Healthcare Providers: https://www.woods-mathews.com/  This test is not yet approved or cleared by the [place] FDA and  has been authorized for detection and/or diagnosis of SARS-CoV-2 by FDA under an Emergency Use Authorization (EUA). This EUA will remain  in  effect (meaning this test can be used) for the duration of the COVID-19 declaration under Se ction 564(b)(1) of the Act, 21 U.S.C. section 360bbb-3(b)(1), unless the authorization is terminated or revoked sooner.  Performed at [facility], [place] [address] [place],  [address]          Radiology Studies: US RENAL  Result Date: 06/12/2021 CLINICAL DATA:  Acute renal injury EXAM: RENAL / URINARY TRACT ULTRASOUND COMPLETE COMPARISON:  None. FINDINGS: Right Kidney: Renal measurements: 9.2 x 3.8 x 5.2 cm. = volume: 96 mL. Echogenicity within normal limits. No mass or hydronephrosis visualized. Left Kidney: Renal measurements: 9.4 x 3.9 x 3.8 = volume: 73 mL. 1.1 cm cyst in the lower pole. Bladder: Appears normal for degree of bladder distention. Other: Bilateral pleural effusions are seen. IMPRESSION: Small left renal cyst. No obstructive changes are noted. Bilateral pleural effusions. Electronically Signed   By: Inez Catalina M.D.   On: 06/12/2021 13:52        Scheduled Meds:   stroke: mapping our early stages of recovery book   Does not apply Once   apixaban  2.5 mg Oral BID   aspirin  81 mg Oral Daily   atorvastatin  40 mg Oral Daily   diltiazem  60 mg Oral Q8H   ezetimibe  10 mg Oral Daily   feeding supplement  237 mL Oral BID BM   hydrALAZINE  75 mg Oral Q8H   multivitamin with minerals  1 tablet Oral Daily   senna-docusate  2 tablet Oral BID   Continuous Infusions:   LOS: 12 days    Time spent: 32 minutes     Barb Merino, MD [facility] Pager [phone]

## 2021-06-13 NOTE — Progress Notes (Signed)
[facility] Progress Note    Assessment/ Plan:   1.  Acute kidney injury on chronic kidney disease stage IV: This appears to be likely hemodynamically mediated in the setting of congestive heart failure with decreased effective arterial blood volume and possibly exacerbated by diuretic use/labile blood pressures. -UA pending, reordered 9/13 -renal u/s w/o obstruction -will try again today for lasix+albumin challenge. Overall, I am not optimistic with her kidney function given her lack of response to therapy 2 days ago -recommend pall care consult, would recommend considering comfort care. Given her advanced age, comobiridities, recent CVA,  would not recommend renal replacement therapy as this will likely worsen her quality of life and hasten mortality -renal diet -Avoid nephrotoxic medications including NSAIDs and iodinated intravenous contrast exposure unless the latter is absolutely indicated.  Preferred narcotic agents for pain control are hydromorphone, fentanyl, and methadone. Morphine should not be used. Avoid Baclofen and avoid oral sodium phosphate and magnesium citrate based laxatives / bowel preps. Continue strict Input and Output monitoring. Will monitor the patient closely with you and intervene or adjust therapy as indicated by changes in clinical status/labs. 2.  Acute exacerbation of diastolic heart failure: Poor response to diuretics, no significant response to a combination of albumin and furosemide to see if we can promote some diuresis.  Overall prognosis appears poor if renal function does not improve. Will try for lasix+albumin today 3.  Hypertension: Remains poorly controlled on hydralazine (just uptitrated by cardiology), diltiazem and metoprolol and will attempt diuresis. 4.  Confusion/deconditioning: Secondary to acute left caudate infarct.  Attempting blood pressure control with ongoing anticoagulation with Eliquis. 5.Metabolic acidosis -secondary to AKI,  monitoring for now, if lower the nwill start nahco3 supplementation  Subjective:   Episodes of bradycardia overnight. Otherwise no acute events. 3 unmeasured voids documented.   Objective:   BP 137/86 (BP Location: Left Arm)   Pulse 87   Temp 97.7 F (36.5 C) (Axillary)   Resp 19   Ht 5' 3.5" (1.613 m)   Wt 52 kg   SpO2 94%   BMI 19.99 kg/m   Intake/Output Summary (Last 24 hours) at 06/13/2021 1058 Last data filed at 06/12/2021 2040 Gross per 24 hour  Intake 240 ml  Output --  Net 240 ml   Weight change:   Physical Exam: Gen:ill appearing CVS:rrr Resp:decreased breath sounds bibasilar, normal wob [id number] Ext:1+ pitting edema bilateral LE's Neuro: confused, following some commands, awake, alert  Imaging: US RENAL  Result Date: 06/12/2021 CLINICAL DATA:  Acute renal injury EXAM: RENAL / URINARY TRACT ULTRASOUND COMPLETE COMPARISON:  None. FINDINGS: Right Kidney: Renal measurements: 9.2 x 3.8 x 5.2 cm. = volume: 96 mL. Echogenicity within normal limits. No mass or hydronephrosis visualized. Left Kidney: Renal measurements: 9.4 x 3.9 x 3.8 = volume: 73 mL. 1.1 cm cyst in the lower pole. Bladder: Appears normal for degree of bladder distention. Other: Bilateral pleural effusions are seen. IMPRESSION: Small left renal cyst. No obstructive changes are noted. Bilateral pleural effusions. Electronically Signed   By: Inez Catalina M.D.   On: 06/12/2021 13:52    Labs: BMET Recent Labs  Lab 06/11/21 0948 06/11/21 1844 06/12/21 0631 06/13/21 0225  NA 139 137 138 137  K 4.5 4.6 4.8 5.1  CL 101 103 104 102  CO2 21* 22 20* 20*  GLUCOSE 199* 228* 134* 142*  BUN 83* 91* 90* 98*  CREATININE 4.04* 4.54* 4.49* 4.88*  CALCIUM 9.1 9.1 9.2 9.3  PHOS  --   --   --  6.4*   CBC Recent Labs  Lab 06/10/21 1123 06/12/21 0631 06/13/21 0225  WBC 12.1* 11.9* 16.0*  NEUTROABS 7.2  --   --   HGB 12.0 10.0* 10.2*  HCT 37.1 31.1* 31.7*  MCV 87.7 87.1 86.4  PLT 243 210 250     Medications:      stroke: mapping our early stages of recovery book   Does not apply Once   apixaban  2.5 mg Oral BID   aspirin  81 mg Oral Daily   atorvastatin  40 mg Oral Daily   diltiazem  60 mg Oral Q8H   ezetimibe  10 mg Oral Daily   feeding supplement  237 mL Oral BID BM   hydrALAZINE  75 mg Oral Q8H   multivitamin with minerals  1 tablet Oral Daily   senna-docusate  2 tablet Oral BID      Gean Quint, MD [place] 06/13/2021, 10:58 AM

## 2021-06-13 NOTE — Progress Notes (Signed)
Patient ID: [name], female   DOB: March 28, 1925, 87 y.o.   MRN: [id number]    Progress Note from the Palliative Medicine Team at [facility]   Patient Name: Heather Saunders        Date: 06/13/2021 DOB: 1925-07-01  Age: 87 y.o. MRN#: [id number] Attending Physician: Barb Merino, MD Primary Care Physician: Kathyrn Lass, MD Admit Date: 05/31/2021   Medical records reviewed   Per intake H&P --> Patient 87 year old female history of A. fib on Eliquis, pulmonary hypertension, chronic kidney disease stage IV, anemia, presented to the ED with confusion as noted by home health aide with some associated slurred speech.  Work-up in the ED with head CT being unremarkable, MRI head consistent with acute CVA.  Patient admitted for stroke work-up.  Hospital course complicated by generalized weakness for which PT OT recommended SNF placement.     Palliative care was asked to get involved to discuss GOC in the setting of an acute stroke, multiple co-morbidites, and a prolonged hospitalization.    This NP reviewed medical records , received report from team assessed the patient and then re-engaged at attending request.    Patient is alert and drinking her morning coffee but confused to location and situation.  Spoke to nephew/ Verlin Grills by telephone.  Continued education regarding current medical situation and Ms Capelli's  high risk to decompensate, and the importance of preparedness plan regarding GOCs and nursing care issues.  At this time plan is for SNF for rehab. ( I spoke to [facility] team/Miranda, awaiting insurance authorization)      Education to nephew regarding hospice benefit both in home and residential.  Questions and concerns addressed.  He plans to come to the hospital tomorrow and will meet with  PMT provider at 11:00  am.     Introduced MOST form and importance of documentation of ACP and EOL wishes.       Questions and concerns addressed   Discussed with Dr Sloan Leiter   Total time  spent on the unit was 45 minutes  Greater than 50% of the time was spent in counseling and coordination of care  Wadie Lessen NP  Palliative Medicine Team Team Phone # [phone] Pager [phone]

## 2021-06-14 DIAGNOSIS — G9340 Encephalopathy, unspecified: Secondary | ICD-10-CM | POA: Diagnosis not present

## 2021-06-14 DIAGNOSIS — I63312 Cerebral infarction due to thrombosis of left middle cerebral artery: Secondary | ICD-10-CM | POA: Diagnosis not present

## 2021-06-14 DIAGNOSIS — E43 Unspecified severe protein-calorie malnutrition: Secondary | ICD-10-CM | POA: Diagnosis not present

## 2021-06-14 DIAGNOSIS — I4821 Permanent atrial fibrillation: Secondary | ICD-10-CM | POA: Diagnosis not present

## 2021-06-14 DIAGNOSIS — N179 Acute kidney failure, unspecified: Secondary | ICD-10-CM

## 2021-06-14 LAB — URINE CULTURE: Culture: NO GROWTH

## 2021-06-14 LAB — CBC
HCT: 32 % — ABNORMAL LOW (ref 36.0–46.0)
Hemoglobin: 10.1 g/dL — ABNORMAL LOW (ref 12.0–15.0)
MCH: 27.4 pg (ref 26.0–34.0)
MCHC: 31.6 g/dL (ref 30.0–36.0)
MCV: 86.7 fL (ref 80.0–100.0)
Platelets: 299 10*3/uL (ref 150–400)
RBC: 3.69 MIL/uL — ABNORMAL LOW (ref 3.87–5.11)
RDW: 15.7 % — ABNORMAL HIGH (ref 11.5–15.5)
WBC: 19.6 10*3/uL — ABNORMAL HIGH (ref 4.0–10.5)
nRBC: 0 % (ref 0.0–0.2)

## 2021-06-14 LAB — RENAL FUNCTION PANEL
Albumin: 4 g/dL (ref 3.5–5.0)
Anion gap: 16 — ABNORMAL HIGH (ref 5–15)
BUN: 113 mg/dL — ABNORMAL HIGH (ref 8–23)
CO2: 19 mmol/L — ABNORMAL LOW (ref 22–32)
Calcium: 9.5 mg/dL (ref 8.9–10.3)
Chloride: 103 mmol/L (ref 98–111)
Creatinine, Ser: 5.95 mg/dL — ABNORMAL HIGH (ref 0.44–1.00)
GFR, Estimated: 6 mL/min — ABNORMAL LOW (ref >60)
Glucose, Bld: 179 mg/dL — ABNORMAL HIGH (ref 70–99)
Phosphorus: 7.2 mg/dL — ABNORMAL HIGH (ref 2.5–4.6)
Potassium: 5.2 mmol/L — ABNORMAL HIGH (ref 3.5–5.1)
Sodium: 138 mmol/L (ref 135–145)

## 2021-06-14 LAB — SARS CORONAVIRUS 2 (TAT 6-24 HRS): SARS Coronavirus 2: NEGATIVE

## 2021-06-14 MED ORDER — HALOPERIDOL 0.5 MG PO TABS
0.5000 mg | ORAL_TABLET | ORAL | Status: DC | PRN
  Filled 2021-06-14: qty 1

## 2021-06-14 MED ORDER — HALOPERIDOL LACTATE 5 MG/ML IJ SOLN: 0.5000 mg | INTRAMUSCULAR | Status: DC | PRN

## 2021-06-14 MED ORDER — OXYCODONE HCL 20 MG/ML PO CONC: 5.0000 mg | ORAL | Status: DC | PRN

## 2021-06-14 MED ORDER — DIPHENHYDRAMINE HCL 50 MG/ML IJ SOLN: 12.5000 mg | INTRAMUSCULAR | Status: DC | PRN

## 2021-06-14 MED ORDER — SODIUM BICARBONATE 650 MG PO TABS
1300.0000 mg | ORAL_TABLET | Freq: Two times a day (BID) | ORAL | Status: DC
  Administered 2021-06-14: 1300 mg via ORAL
  Filled 2021-06-14: qty 2

## 2021-06-14 MED ORDER — LORAZEPAM 2 MG/ML PO CONC: 1.0000 mg | ORAL | Status: DC | PRN

## 2021-06-14 MED ORDER — HYDROMORPHONE HCL 1 MG/ML IJ SOLN
0.5000 mg | INTRAMUSCULAR | Status: DC | PRN
  Administered 2021-06-15 – 2021-06-16 (×6): 0.5 mg via INTRAVENOUS
  Filled 2021-06-14 (×6): qty 1

## 2021-06-14 MED ORDER — LORAZEPAM 1 MG PO TABS: 1.0000 mg | ORAL_TABLET | ORAL | Status: DC | PRN

## 2021-06-14 MED ORDER — LORAZEPAM 2 MG/ML IJ SOLN: 1.0000 mg | INTRAMUSCULAR | Status: DC | PRN

## 2021-06-14 MED ORDER — HALOPERIDOL LACTATE 2 MG/ML PO CONC
0.5000 mg | ORAL | Status: DC | PRN
  Filled 2021-06-14: qty 0.3

## 2021-06-14 MED ORDER — HYDROMORPHONE HCL 1 MG/ML IJ SOLN: 0.5000 mg | INTRAMUSCULAR | Status: DC

## 2021-06-14 MED ORDER — OXYCODONE HCL 20 MG/ML PO CONC
5.0000 mg | ORAL | Status: DC | PRN
  Administered 2021-06-15: 5 mg via ORAL
  Filled 2021-06-14: qty 1

## 2021-06-14 NOTE — Progress Notes (Signed)
Palliative Medicine RN Note: Rec'd a call from pt's nephew Shanon Brow. Family has decided to go to residential hospice. They specifically requested [facility]. I reached out to [place] and Manufacturing engineer, aka [facility] liaison [place].  Marjie Skiff Emerald Gehres, RN, BSN, [facility] Palliative Medicine Team 06/14/2021 1:17 PM Office [phone]

## 2021-06-14 NOTE — Progress Notes (Signed)
[facility] Progress Note    Assessment/ Plan:   1.  Acute kidney injury on chronic kidney disease stage IV: This appears to be likely hemodynamically mediated in the setting of congestive heart failure with decreased effective arterial blood volume and possibly exacerbated by diuretic use/labile blood pressures. -UA pending, reordered 9/13 -renal u/s w/o obstruction -failed lasix+albumin challenge x 2, cr continues to rise -recommend pall care consult, would recommend considering comfort care/hospice at this time given no improvement in kidney function despite treatment attempts. I just spoke with Shanon Brow (nephew) on the phone and updated him on the situation and discussed my recommendations. He is currently on the way to the hospital for a meeting with palliative care this AM. Discussed with primary service. -Given her advanced age, comobiridities, recent CVA,  would not recommend renal replacement therapy as this will likely worsen her quality of life and hasten mortality -renal diet -Avoid nephrotoxic medications including NSAIDs and iodinated intravenous contrast exposure unless the latter is absolutely indicated.  Preferred narcotic agents for pain control are hydromorphone, fentanyl, and methadone. Morphine should not be used. Avoid Baclofen and avoid oral sodium phosphate and magnesium citrate based laxatives / bowel preps. Continue strict Input and Output monitoring. Will monitor the patient closely with you and intervene or adjust therapy as indicated by changes in clinical status/labs. 2.  Acute exacerbation of diastolic heart failure: Poor response to diuretics, no significant response to a combination of albumin and furosemide to see if we can promote some diuresis.  Overall prognosis appears poor if renal function does not improve. Failed lasix + albumin again 9/14 3.  Hypertension: improved. on hydralazine ,diltiazem and metoprolol 4.  Confusion/deconditioning: Secondary  to acute left caudate infarct.  Attempting blood pressure control with ongoing anticoagulation with Eliquis. 5.Metabolic acidosis -secondary to AKI, monitoring for now, nahco3 '1300mg'$  bid  Subjective:   Episodes of bradycardia overnight. Otherwise no acute events. 3 unmeasured voids documented.   Objective:   BP (!) 142/80 (BP Location: Left Arm)   Pulse 72   Temp 98.3 F (36.8 C) (Axillary)   Resp 18   Ht 5' 3.5" (1.613 m)   Wt 50.6 kg   SpO2 98%   BMI 19.45 kg/m   Intake/Output Summary (Last 24 hours) at 06/14/2021 0859 Last data filed at 06/13/2021 2200 Gross per 24 hour  Intake 340 ml  Output --  Net 340 ml   Weight change:   Physical Exam: Gen:ill appearing CVS:rrr Resp:decreased breath sounds bibasilar, normal wob [id number] Ext:1+ pitting edema bilateral LE's Neuro: confused, awake  Imaging: US RENAL  Result Date: 06/12/2021 CLINICAL DATA:  Acute renal injury EXAM: RENAL / URINARY TRACT ULTRASOUND COMPLETE COMPARISON:  None. FINDINGS: Right Kidney: Renal measurements: 9.2 x 3.8 x 5.2 cm. = volume: 96 mL. Echogenicity within normal limits. No mass or hydronephrosis visualized. Left Kidney: Renal measurements: 9.4 x 3.9 x 3.8 = volume: 73 mL. 1.1 cm cyst in the lower pole. Bladder: Appears normal for degree of bladder distention. Other: Bilateral pleural effusions are seen. IMPRESSION: Small left renal cyst. No obstructive changes are noted. Bilateral pleural effusions. Electronically Signed   By: Inez Catalina M.D.   On: 06/12/2021 13:52    Labs: BMET Recent Labs  Lab 06/11/21 0948 06/11/21 1844 06/12/21 0631 06/13/21 0225 06/14/21 0355  NA 139 137 138 137 138  K 4.5 4.6 4.8 5.1 5.2*  CL 101 103 104 102 103  CO2 21* 22 20* 20* 19*  GLUCOSE 199* 228* 134*  142* 179*  BUN 83* 91* 90* 98* 113*  CREATININE 4.04* 4.54* 4.49* 4.88* 5.95*  CALCIUM 9.1 9.1 9.2 9.3 9.5  PHOS  --   --   --  6.4* 7.2*   CBC Recent Labs  Lab 06/10/21 1123 06/12/21 0631  06/13/21 0225 06/14/21 0355  WBC 12.1* 11.9* 16.0* 19.6*  NEUTROABS 7.2  --   --   --   HGB 12.0 10.0* 10.2* 10.1*  HCT 37.1 31.1* 31.7* 32.0*  MCV 87.7 87.1 86.4 86.7  PLT 243 210 250 299    Medications:      stroke: mapping our early stages of recovery book   Does not apply Once   apixaban  2.5 mg Oral BID   aspirin  81 mg Oral Daily   atorvastatin  40 mg Oral Daily   ezetimibe  10 mg Oral Daily   feeding supplement  237 mL Oral BID BM   hydrALAZINE  75 mg Oral Q8H   multivitamin with minerals  1 tablet Oral Daily   senna-docusate  2 tablet Oral BID      Gean Quint, MD [place] 06/14/2021, 8:59 AM

## 2021-06-14 NOTE — Progress Notes (Signed)
PROGRESS NOTE    Heather Saunders  FIE:[id number] DOB: 1925/05/14 DOA: 05/31/2021 PCP: Kathyrn Lass, MD    Brief Narrative:  87 year old female with history of chronic A. fib on Eliquis, pulmonary hypertension, chronic kidney disease stage IV, anemia of chronic disease and debility brought to emergency room with confusion as noted by home health aide and also noted to have slurred speech.  Work-up in the emergency room with head CT unremarkable.  MRI consistent with acute small vessel disease stroke.  Hospital course complicated with persistent weakness, uncontrolled hypertension.  Acute kidney injury with underlying chronic kidney disease. Renal functions continue to worsen.  Mental status continues to worsen. Palliative care discussion underway for possible hospice.   Assessment & Plan:   Principal Problem:   CVA (cerebral vascular accident) ([place]) Active Problems:   Essential hypertension   Pulmonary HTN ([place])   Acute encephalopathy   Atrial fibrillation (HCC)   CKD (chronic kidney disease), stage IV (HCC)   Chronic diastolic congestive heart failure (HCC)   Protein-calorie malnutrition, severe   Pressure injury of skin  Acute left caudate nucleus CVA with acute metabolic encephalopathy: Presented with confusion and slurred speech. MRI/MRA of the head consistent with a small acute infarct left carotid nucleus, no hemorrhage or mass-effect.  This was thought to be small vessel disease. Patient already on Eliquis for anticoagulation for chronic A. fib, aspirin was added. 2D echocardiogram with normal EF.  No source of emboli. Carotid Dopplers with no significant ICA stenosis. Neurology recommended to continue Eliquis and add aspirin for further stroke prevention. Started on dysphagia 3 diet.Outpatient follow-up with neurology. See goal of care discussion below.  Acute kidney injury on chronic kidney disease stage IV: Reported baseline creatinine 1.8.  She was treated with Lasix,  renal functions deteriorated and creatinine creeping up. BUN/creatinine 113/5.95.  No urine output overnight. Followed by nephrology.  Patient is not a candidate for hemodialysis.  Acute on chronic diastolic congestive heart failure: Intolerance to diuretics.  Currently euvolemic.  Essential hypertension, uncontrolled: Continue hydralazine. Metoprolol and Cardizem discontinued due to significant bradycardia.  Chronic A. fib with bradycardia: Noted heart rate less than 40 with pause of 2.6 seconds.  Patient is asymptomatic. She is not interventional candidate or pacemaker candidate.  Goal of care: Patient with significant physical debility Now with worsening mental status due to uremia.  Worsening renal functions. Adult failure to thrive. Probably end-of-life.  Palliative care meeting today with family. I met with the patient's one of the nephew Mr. [name] at the bedside and we discussed about poor outcome and unlike recovery from current illnesses. Patient has 2 nephews and one niece.  Shanon Brow does have legal power of attorney papers but no healthcare power of attorney.  They are the nearest living next of kin. [name] agrees that patient will not survive long. Both of the nephew meeting with palliative care team today. Will recommend starting comfort care and transition to hospice home when family agrees.     DVT prophylaxis: apixaban (ELIQUIS) tablet 2.5 mg Start: 06/02/21 1030 Place and maintain sequential compression device Start: 05/31/21 2137 apixaban (ELIQUIS) tablet 2.5 mg   Code Status: DNR Family Communication: Eunice Blase at the bedside. Disposition Plan: Status is: Inpatient  Remains inpatient appropriate because:Inpatient level of care appropriate due to severity of illness  Dispo:  Patient From: Home  Planned Disposition: Inpatient hospice.  Medically stable for discharge: No           Consultants:  Palliative  team Nephrology Cardiology  Neurology  Procedures:  None  Antimicrobials:  None   Subjective: Patient seen and examined. was at the bedside.  Patient was confused and disoriented.  She thought she is at [employer].  Sleeps in between conversation and difficult to keep her awake.  Objective: Vitals:   06/14/21 0100 06/14/21 0408 06/14/21 0500 06/14/21 0805  BP: (!) 146/94 (!) 151/83  (!) 142/80  Pulse: 96 82  72  Resp: '18 18  18  ' Temp: (!) 97.3 F (36.3 C) 98.5 F (36.9 C)  98.3 F (36.8 C)  TempSrc: Oral Oral  Axillary  SpO2: 96% 94%  98%  Weight:   50.6 kg   Height:        Intake/Output Summary (Last 24 hours) at 06/14/2021 1121 Last data filed at 06/13/2021 2200 Gross per 24 hour  Intake 340 ml  Output --  Net 340 ml   Filed Weights   06/11/21 0623 06/12/21 0500 06/14/21 0500  Weight: 49.6 kg 52 kg 50.6 kg    Examination:  General: Frail and debilitated.  Chronically sick looking.  Mostly sleepy and lethargic.  Not able to keep up with communication. Cardiovascular: S1-S2 normal.  Irregularly irregular. Respiratory: Bilateral clear.  No added sounds. Gastrointestinal: Soft.  Nontender.  Bowel sounds present. Ext: No edema or cyanosis. Neuro: Alert on stimulation only.  Not oriented.  Generalized weakness.  No focal deficits.      Data Reviewed: I have personally reviewed following labs and imaging studies  CBC: Recent Labs  Lab 06/10/21 1123 06/12/21 0631 06/13/21 0225 06/14/21 0355  WBC 12.1* 11.9* 16.0* 19.6*  NEUTROABS 7.2  --   --   --   HGB 12.0 10.0* 10.2* 10.1*  HCT 37.1 31.1* 31.7* 32.0*  MCV 87.7 87.1 86.4 86.7  PLT 243 210 250 643   Basic Metabolic Panel: Recent Labs  Lab 06/11/21 0948 06/11/21 1844 06/12/21 0631 06/13/21 0225 06/14/21 0355  NA 139 137 138 137 138  K 4.5 4.6 4.8 5.1 5.2*  CL 101 103 104 102 103  CO2 21* 22 20* 20* 19*  GLUCOSE 199* 228* 134* 142* 179*  BUN 83* 91* 90* 98* 113*  CREATININE 4.04* 4.54*  4.49* 4.88* 5.95*  CALCIUM 9.1 9.1 9.2 9.3 9.5  PHOS  --   --   --  6.4* 7.2*   GFR: Estimated Creatinine Clearance: 4.6 mL/min (A) (by C-G formula based on SCr of 5.95 mg/dL (H)). Liver Function Tests: Recent Labs  Lab 06/12/21 0631 06/13/21 0225 06/14/21 0355  AST 35  --   --   ALT 33  --   --   ALKPHOS 87  --   --   BILITOT 2.2*  --   --   PROT 5.8*  --   --   ALBUMIN 3.6 3.5 4.0   No results for input(s): LIPASE, AMYLASE in the last 168 hours. No results for input(s): AMMONIA in the last 168 hours. Coagulation Profile: No results for input(s): INR, PROTIME in the last 168 hours. Cardiac Enzymes: No results for input(s): CKTOTAL, CKMB, CKMBINDEX, TROPONINI in the last 168 hours. BNP (last 3 results) No results for input(s): PROBNP in the last 8760 hours. HbA1C: No results for input(s): HGBA1C in the last 72 hours. CBG: Recent Labs  Lab 06/10/21 0821  GLUCAP 132*   Lipid Profile: No results for input(s): CHOL, HDL, LDLCALC, TRIG, CHOLHDL, LDLDIRECT in the last 72 hours. Thyroid Function Tests: No results for input(s): TSH, T4TOTAL, FREET4, T3FREE, THYROIDAB in the  last 72 hours. Anemia Panel: No results for input(s): VITAMINB12, FOLATE, FERRITIN, TIBC, IRON, RETICCTPCT in the last 72 hours. Sepsis Labs: Recent Labs  Lab 06/10/21 1123  PROCALCITON 0.32    Recent Results (from the past 240 hour(s))  SARS CORONAVIRUS 2 (TAT 6-24 HRS) Nasopharyngeal Nasopharyngeal Swab     Status: None   Collection Time: 06/08/21 11:34 AM   Specimen: Nasopharyngeal Swab  Result Value Ref Range Status   SARS Coronavirus 2 NEGATIVE NEGATIVE Final    Comment: (NOTE) SARS-CoV-2 target nucleic acids are NOT DETECTED.  The SARS-CoV-2 RNA is generally detectable in upper and lower respiratory specimens during the acute phase of infection. Negative results do not preclude SARS-CoV-2 infection, do not rule out co-infections with other pathogens, and should not be used as the sole  basis for treatment or other patient management decisions. Negative results must be combined with clinical observations, patient history, and epidemiological information. The expected result is Negative.  Fact Sheet for Patients: SugarRoll.be  Fact Sheet for Healthcare Providers: https://www.woods-mathews.com/  This test is not yet approved or cleared by the [place] FDA and  has been authorized for detection and/or diagnosis of SARS-CoV-2 by FDA under an Emergency Use Authorization (EUA). This EUA will remain  in effect (meaning this test can be used) for the duration of the COVID-19 declaration under Se ction 564(b)(1) of the Act, 21 U.S.C. section 360bbb-3(b)(1), unless the authorization is terminated or revoked sooner.  Performed at [facility], [place] [address] [place], [place] [address]   SARS CORONAVIRUS 2 (TAT 6-24 HRS) Nasopharyngeal Nasopharyngeal Swab     Status: None   Collection Time: 06/13/21  5:10 PM   Specimen: Nasopharyngeal Swab  Result Value Ref Range Status   SARS Coronavirus 2 NEGATIVE NEGATIVE Final    Comment: (NOTE) SARS-CoV-2 target nucleic acids are NOT DETECTED.  The SARS-CoV-2 RNA is generally detectable in upper and lower respiratory specimens during the acute phase of infection. Negative results do not preclude SARS-CoV-2 infection, do not rule out co-infections with other pathogens, and should not be used as the sole basis for treatment or other patient management decisions. Negative results must be combined with clinical observations, patient history, and epidemiological information. The expected result is Negative.  Fact Sheet for Patients: SugarRoll.be  Fact Sheet for Healthcare Providers: https://www.woods-mathews.com/  This test is not yet approved or cleared by the [place] FDA and  has been authorized for detection and/or diagnosis of  SARS-CoV-2 by FDA under an Emergency Use Authorization (EUA). This EUA will remain  in effect (meaning this test can be used) for the duration of the COVID-19 declaration under Se ction 564(b)(1) of the Act, 21 U.S.C. section 360bbb-3(b)(1), unless the authorization is terminated or revoked sooner.  Performed at [facility], [place] [address] [place], [place] [address]          Radiology Studies: US RENAL  Result Date: 06/12/2021 CLINICAL DATA:  Acute renal injury EXAM: RENAL / URINARY TRACT ULTRASOUND COMPLETE COMPARISON:  None. FINDINGS: Right Kidney: Renal measurements: 9.2 x 3.8 x 5.2 cm. = volume: 96 mL. Echogenicity within normal limits. No mass or hydronephrosis visualized. Left Kidney: Renal measurements: 9.4 x 3.9 x 3.8 = volume: 73 mL. 1.1 cm cyst in the lower pole. Bladder: Appears normal for degree of bladder distention. Other: Bilateral pleural effusions are seen. IMPRESSION: Small left renal cyst. No obstructive changes are noted. Bilateral pleural effusions. Electronically Signed   By: Linus Mako.D.  On: 06/12/2021 13:52        Scheduled Meds:   stroke: mapping our early stages of recovery book   Does not apply Once   apixaban  2.5 mg Oral BID   aspirin  81 mg Oral Daily   atorvastatin  40 mg Oral Daily   ezetimibe  10 mg Oral Daily   feeding supplement  237 mL Oral BID BM   hydrALAZINE  75 mg Oral Q8H   multivitamin with minerals  1 tablet Oral Daily   senna-docusate  2 tablet Oral BID   sodium bicarbonate  1,300 mg Oral BID   Continuous Infusions:   LOS: 13 days    Time spent: 32 minutes     Barb Merino, MD [facility] Pager [phone]

## 2021-06-14 NOTE — Progress Notes (Signed)
Physical Therapy Treatment Patient Details Name: Heather Saunders MRN: [id number] DOB: [birth year] Today's Date: 06/14/2021   History of Present Illness 87 yr old female who presented due to change in cognition. Pt found to have an acute left caudate nucleus CVA.  PMH but to limted to: pulmonary HTN, HTNN, afib, CKD, chronic diastolic congestive heart faluire    PT Comments    Patient seen with nephew in the room and just s/p MD visit.  Seems plans to pursue palliative/Hospice per MD, but okay to see pt today if able to participate.  Patient participated in EOB activity talking, drinking orange juice and able to stand to step closer to [facility].  Seemed more comfortable after repositioning, but easily returns to sleep with lethargy and more confusion limiting progress.  Further skilled PT determined following [place] meeting.     Recommendations for follow up therapy are one component of a multi-disciplinary discharge planning process, led by the attending physician.  Recommendations may be updated based on patient status, additional functional criteria and insurance authorization.  Follow Up Recommendations  SNF     Equipment Recommendations  Rolling walker with 5" wheels;3in1 (PT);Wheelchair (measurements PT);Hospital bed    Recommendations for Other Services       Precautions / Restrictions Precautions Precautions: Fall Precaution Comments: confused and high fall risk Restrictions Weight Bearing Restrictions: No     Mobility  Bed Mobility Overal bed mobility: Needs Assistance Bed Mobility: Supine to Sit;Sit to Supine Rolling: Min guard   Supine to sit: Mod assist Sit to supine: Min assist   General bed mobility comments: assist to initiate moving legs to EOB as lethargic, pt pulled up with assist with cues and increased time    Transfers Overall transfer level: Needs assistance Equipment used: Rolling walker (2 wheeled) Transfers: Sit to/from Stand Sit to Stand: Mod assist          General transfer comment: lifting help to stand  Ambulation/Gait Ambulation/Gait assistance: Min assist Gait Distance (Feet): 3 Feet Assistive device: Rolling walker (2 wheeled) Gait Pattern/deviations: Step-to pattern     General Gait Details: side steps to [place]             Wheelchair Mobility    Modified Rankin (Stroke Patients Only) Modified Rankin (Stroke Patients Only) Pre-Morbid Rankin Score: No significant disability Modified Rankin: Moderately severe disability     Balance Overall balance assessment: Needs assistance Sitting-balance support: No upper extremity supported;Feet supported Sitting balance-Leahy Scale: Poor Sitting balance - Comments: leaning back at times with lethargy so support given in sitting Postural control: Posterior lean Standing balance support: Bilateral upper extremity supported Standing balance-Leahy Scale: Poor Standing balance comment: reliant on UE support                            Cognition Arousal/Alertness: Awake/alert Behavior During Therapy: WFL for tasks assessed/performed Overall Cognitive Status: Impaired/Different from baseline Area of Impairment: Orientation;Attention;Memory;Following commands;Safety/judgement;Awareness;Problem solving                 Orientation Level: Disoriented to;Place;Time;Situation Current Attention Level: Focused Memory: Decreased recall of precautions;Decreased short-term memory Following Commands: Follows one step commands with increased time Safety/Judgement: Decreased awareness of safety;Decreased awareness of deficits Awareness: Intellectual Problem Solving: Slow processing;Difficulty sequencing;Requires verbal cues;Requires tactile cues;Decreased initiation General Comments: lethargic with kidney numbers up high, able to converse but confusion at times with word finding difficulty      Exercises  General Comments General comments (skin  integrity, edema, etc.): Nephew in the room and reports further plans for comfort, spoke with MD who states okay to see pt but plans to meet with team for [place].  Patient drank orange juice seated EOB      Pertinent Vitals/Pain Pain Assessment: No/denies pain    Home Living                      Prior Function            PT Goals (current goals can now be found in the care plan section) Acute Rehab PT Goals Patient Stated Goal: none stated Progress towards PT goals: Not progressing toward goals - comment    Frequency    Min 2X/week      PT Plan Current plan remains appropriate;Frequency needs to be updated    Co-evaluation              AM-PAC PT "6 Clicks" Mobility   Outcome Measure  Help needed turning from your back to your side while in a flat bed without using bedrails?: A Lot Help needed moving from lying on your back to sitting on the side of a flat bed without using bedrails?: A Lot Help needed moving to and from a bed to a chair (including a wheelchair)?: A Lot Help needed standing up from a chair using your arms (e.g., wheelchair or bedside chair)?: A Lot Help needed to walk in hospital room?: A Lot Help needed climbing 3-5 steps with a railing? : Total 6 Click Score: 11    End of Session   Activity Tolerance: Patient limited by lethargy Patient left: in bed;with call bell/phone within reach;with family/visitor present   PT Visit Diagnosis: Other abnormalities of gait and mobility (R26.89);Muscle weakness (generalized) (M62.81);Other symptoms and signs involving the nervous system (R29.898)     Time: [id number] PT Time Calculation (min) (ACUTE ONLY): 25 min  Charges:  $Therapeutic Activity: 23-37 mins                     Magda Kiel, PT Acute Rehabilitation Services [phone] Office:[phone] 06/14/2021    Reginia Naas 06/14/2021, 11:43 AM

## 2021-06-14 NOTE — Progress Notes (Signed)
Daily Progress Note   Patient Name: Heather Saunders       Date: 06/14/2021 DOB: [birth year]  Age: 87 y.o. MRN#: [id number] Attending Physician: Barb Merino, MD Primary Care Physician: Kathyrn Lass, MD Admit Date: 05/31/2021  Reason for Consultation/Follow-up: Establishing goals of care  Patient Profile/HPI:  87 yo F with past medical history of A fib., pulmonary HTN, CKD IV, anemia admitted on 05/31/21 with confusion and slurred speech. Workup reveals acute CVA. Her recovery has been complicated by acute kidney injury likely due to hemodynamic instability and poor po intake- her Cr and BUN are rising rapidly. Palliative consulted for [place].   Subjective: Heather Saunders appears more lethargic than noted in previous documentation. She does not arouse to my voice or light touch. She is sleeping more than awake these days unless stimulated- then she quickly falls back to sleep. When she is awake she speaks, but it is nonsensical. She is eating and drinking minimally.  On my exam she appears mildly SOB with dyspnea at rest.  Met with [name] and Waunita Schooner- initially the plan was to discharge to SNF with Palliative and plan to transition to Hospice.  However, given her worsening renal function and her worsened clinical picture today family has decided to transition to full comfort measures only and request inpatient Hospice.   Review of Systems  Unable to perform ROS: Mental status change    Physical Exam Vitals and nursing note reviewed.  Constitutional:      Appearance: She is ill-appearing.  Cardiovascular:     Rate and Rhythm: Rhythm irregular.  Skin:    Coloration: Skin is pale.  Neurological:     Mental Status: She is disoriented.     Motor: Weakness present.     Comments: lethargic             Vital Signs: BP (!) 145/95 (BP Location: Left Arm)   Pulse 75   Temp 98.4 F (36.9 C) (Axillary)   Resp 18   Ht 5' 3.5" (1.613 m)   Wt 50.6 kg   SpO2 100%   BMI 19.45 kg/m  SpO2: SpO2: 100 % O2 Device: O2 Device: Room Air O2 Flow Rate:    Intake/output summary:  Intake/Output Summary (Last 24 hours) at 06/14/2021 1334 Last data filed at 06/13/2021 2200 Gross per 24 hour  Intake  340 ml  Output --  Net 340 ml   LBM: Last BM Date: 06/13/21 Baseline Weight: Weight: 49.4 kg Most recent weight: Weight: 50.6 kg       Palliative Assessment/Data: PPS: 20%      Patient Active Problem List   Diagnosis Date Noted   Pressure injury of skin 06/11/2021   Protein-calorie malnutrition, severe 06/08/2021   CVA (cerebral vascular accident) ([place]) 06/01/2021   CKD (chronic kidney disease), stage IV (HCC)    Chronic diastolic congestive heart failure ([place])    Acute encephalopathy 05/31/2021   Atrial fibrillation ([place]) 05/31/2021   Acute kidney injury ([place])    Pulmonary HTN (HCC)    Right atrial mass    Acute diastolic CHF (congestive heart failure) ([place])    Atrial fibrillation with RVR ([place]) 03/18/2021   AKI (acute kidney injury) ([place]) 03/18/2021   Essential hypertension 08/03/2018   Coronary artery disease involving native coronary artery of native heart without angina pectoris 08/03/2018   Dyspnea on exertion 08/03/2018   Mixed hyperlipidemia 08/03/2018    Palliative Care Assessment & Plan    Recommendations/Plan  Transition to full comfort measures only- d/c labs, meds, and interventions that do not provide comfort Oxycodone liquid 35m/mL - 572mq2hrs prn for dyspnea Lorazepam 7m38mo, SL or IV q4 hr anxiety Haldol .5mg77m, SL or IV q4 hr PRN agitation Glycopyrrolate .2mg 13mq4hr secretions    Code Status: DNR  Prognosis:  Hours - Days  Discharge Planning: Hospice facility  Care plan was discussed with patient's family and care team  Thank you for allowing  the Palliative Medicine Team to assist in the care of this patient.  Total time:  Prolonged billing:      Greater than 50%  of this time was spent counseling and coordinating care related to the above assessment and plan.  KasieMariana KaufmanP-C Palliative Medicine   Please contact Palliative Medicine Team phone at [phone] and concerns.

## 2021-06-14 NOTE — Progress Notes (Signed)
Manufacturing engineer [facility])  Referral received for EOL care at [facility].  There is not a bed today.  [facility] will update family once eligibility has been determined.  Venia Carbon RN, BSN, [facility] Liaison

## 2021-06-14 NOTE — TOC Initial Note (Signed)
Transition of Care [facility]) - Initial/Assessment Note    Patient Details  Name: Heather Saunders MRN: [id number] Date of Birth: August 24, 1925  [facility]) CM/SW Contact:    Marilu Favre, RN Phone Number: 06/14/2021, 1:25 PM  Clinical Narrative:                 Spoke to Heather Saunders , discussed residential hospice, choice offered , Heather Saunders prefers [employer] [facility]   NCM called Anderson Malta with [facility] and left message.   Expected Discharge Plan: Hospice Medical Facility Barriers to Discharge: Other (must enter comment)   Patient Goals and CMS Choice Patient states their goals for this hospitalization and ongoing recovery are:: "I want to go home. But I'll think about it." CMS Medicare.gov Compare Post Acute Care list provided to:: Patient Choice offered to / list presented to :  (Heather Saunders)  Expected Discharge Plan and Services Expected Discharge Plan: [place]   Discharge Planning Services: CM Consult Post Acute Care Choice: NA   Expected Discharge Date: 06/08/21                 DME Agency: NA       HH Arranged: NA          Prior Living Arrangements/Services     Patient language and need for interpreter reviewed:: Yes Do you feel safe going back to the place where you live?: Yes      Need for Family Participation in Patient Care: No (Comment) Care giver support system in place?: Yes (comment)   Criminal Activity/Legal Involvement Pertinent to Current Situation/Hospitalization: No - Comment as needed  Activities of Daily Living Home Assistive Devices/Equipment: Walker (specify type) ADL Screening (condition at time of admission) Patient's cognitive ability adequate to safely complete daily activities?: No Is the patient deaf or have difficulty hearing?: No Does the patient have difficulty seeing, even when wearing glasses/contacts?: Yes Does the patient have difficulty concentrating, remembering, or making decisions?: Yes Patient  able to express need for assistance with ADLs?: Yes Does the patient have difficulty dressing or bathing?: Yes Independently performs ADLs?: No Communication: Independent Dressing (OT): Needs assistance Is this a change from baseline?: Pre-admission baseline Grooming: Needs assistance Is this a change from baseline?: Pre-admission baseline Bathing: Needs assistance Is this a change from baseline?: Pre-admission baseline Toileting: Needs assistance Is this a change from baseline?: Pre-admission baseline Does the patient have difficulty walking or climbing stairs?: Yes Weakness of Legs: Both Weakness of Arms/Hands: Both  Permission Sought/Granted Permission sought to share information with : Facility Art therapist granted to share information with : Yes, Verbal Permission Granted  Share Information with NAME: Heather Saunders [id number]           Emotional Assessment Appearance:: Appears stated age Attitude/Demeanor/Rapport: Self-Confident Affect (typically observed): Calm Orientation: : Fluctuating Orientation (Suspected and/or reported Sundowners) Alcohol / Substance Use: Not Applicable Psych Involvement: No (comment)  Admission diagnosis:  Aphasia [R47.01] Acute encephalopathy [G93.40] Patient Active Problem List   Diagnosis Date Noted   Pressure injury of skin 06/11/2021   Protein-calorie malnutrition, severe 06/08/2021   CVA (cerebral vascular accident) ([place]) 06/01/2021   CKD (chronic kidney disease), stage IV ([place])    Chronic diastolic congestive heart failure ([place])    Acute encephalopathy 05/31/2021   Atrial fibrillation ([place]) 05/31/2021   Acute kidney injury ([place])    Pulmonary HTN (HCC)    Right atrial mass    Acute diastolic CHF (congestive heart failure) ([place])  Atrial fibrillation with RVR ([place]) 03/18/2021   AKI (acute kidney injury) ([place]) 03/18/2021   Essential hypertension 08/03/2018   Coronary artery disease involving native coronary  artery of native heart without angina pectoris 08/03/2018   Dyspnea on exertion 08/03/2018   Mixed hyperlipidemia 08/03/2018   PCP:  Kathyrn Lass, MD Pharmacy:   [id number] [id number]: [phone]: [phone]    Social Determinants of Health (SDOH) Interventions    Readmission Risk Interventions No flowsheet data found.

## 2021-06-14 NOTE — Progress Notes (Signed)
Occupational Therapy Treatment Patient Details Name: Heather Saunders MRN: [id number] DOB: 08/28/1925 Today's Date: 06/14/2021   History of present illness 87 yr old female who presented due to change in cognition. Pt found to have an acute left caudate nucleus CVA.  PMH but to limted to: pulmonary HTN, HTNN, afib, CKD, chronic diastolic congestive heart faluire   OT comments  Pt presented in bed awake and attempting to communicate about "her numbers" but due to word finding difficulties unable to clarify on what they were reporting. Pt required tactile and verbal cues on how to complete bed mobility. Pt engaged in on sit to stand with min assist to RW and noted to start to become anxious and wanted to sit back down. Pt continued to feel "shaky" and then with min assist went into supine then started to close their eyes and the nurse made aware. Pt currently with functional limitations due to the deficits listed below (see OT Problem List).  Pt will benefit from skilled OT to increase their safety and independence with ADL and functional mobility for ADL to facilitate discharge to venue listed below.     Recommendations for follow up therapy are one component of a multi-disciplinary discharge planning process, led by the attending physician.  Recommendations may be updated based on patient status, additional functional criteria and insurance authorization.    Follow Up Recommendations  SNF;Supervision/Assistance - 24 hour    Equipment Recommendations       Recommendations for Other Services      Precautions / Restrictions Precautions Precautions: Fall Precaution Comments: confused and high fall risk Restrictions Weight Bearing Restrictions: No       Mobility Bed Mobility Overal bed mobility: Needs Assistance Bed Mobility: Supine to Sit;Sit to Supine Rolling: Min guard   Supine to sit: Min guard Sit to supine: Min guard   General bed mobility comments: pt needed tactile cues on  how to sequence task    Transfers Overall transfer level: Needs assistance Equipment used: Rolling walker (2 wheeled) Transfers: Sit to/from Stand Sit to Stand: Min assist         General transfer comment: cues on how to transfer from bed to walker    Balance Overall balance assessment: Needs assistance Sitting-balance support: No upper extremity supported;Feet supported Sitting balance-Leahy Scale: Fair Sitting balance - Comments: leaning back at times with lethargy so support given in sitting Postural control: Posterior lean Standing balance support: Bilateral upper extremity supported Standing balance-Leahy Scale: Poor Standing balance comment: reliant on UE support                           ADL either performed or assessed with clinical judgement   ADL Overall ADL's : Needs assistance/impaired     Grooming: Brushing hair;Cueing for safety;Cueing for sequencing;Sitting;Wash/dry face;Set up                                 General ADL Comments: attempted to complete transfer but with standing task pt reported becoming shaky and nervous and wanting to sit down, due to pt decrease word finding they had difficulties expressing on how they feel     Vision   Additional Comments: wears glasses at all times   Perception     Praxis      Cognition Arousal/Alertness: Awake/alert Behavior During Therapy: Anxious Overall Cognitive Status: Impaired/Different from baseline Area of Impairment: Orientation;Attention;Memory;Following commands;Safety/judgement;Awareness;Problem  solving                 Orientation Level: Disoriented to;Place;Time;Situation Current Attention Level: Focused Memory: Decreased short-term memory;Decreased recall of precautions Following Commands: Follows one step commands inconsistently;Follows one step commands with increased time Safety/Judgement: Decreased awareness of safety;Decreased awareness of  deficits Awareness: Intellectual Problem Solving: Slow processing;Difficulty sequencing;Requires verbal cues;Requires tactile cues General Comments: difficulties with word finding and became anxious in session        Exercises     Shoulder Instructions       General Comments      Pertinent Vitals/ Pain       Pain Assessment: No/denies pain  Home Living                                          Prior Functioning/Environment              Frequency  Min 2X/week        Progress Toward Goals  OT Goals(current goals can now be found in the care plan section)  Progress towards OT goals: Not progressing toward goals - comment (pt was very nervous in session and nurse reproted on in the night attempting to stand multiple times)  Acute Rehab OT Goals Patient Stated Goal: none stated OT Goal Formulation: With patient Time For Goal Achievement: 06/22/21 Potential to Achieve Goals: Fair ADL Goals Pt Will Perform Upper Body Bathing: with modified independence;sitting Pt Will Perform Lower Body Bathing: with set-up;sit to/from stand Pt Will Transfer to Toilet: with supervision;ambulating;regular height toilet Pt Will Perform Tub/Shower Transfer: with supervision;ambulating;shower seat;rolling walker  Plan Discharge plan remains appropriate;Frequency remains appropriate    Co-evaluation                 AM-PAC OT "6 Clicks" Daily Activity     Outcome Measure   Help from another person eating meals?: None Help from another person taking care of personal grooming?: A Little Help from another person toileting, which includes using toliet, bedpan, or urinal?: A Lot Help from another person bathing (including washing, rinsing, drying)?: A Lot Help from another person to put on and taking off regular upper body clothing?: A Lot Help from another person to put on and taking off regular lower body clothing?: Total 6 Click Score: 14    End of Session  Equipment Utilized During Treatment: Gait belt;Rolling walker  OT Visit Diagnosis: Unsteadiness on feet (R26.81);Other abnormalities of gait and mobility (R26.89);Muscle weakness (generalized) (M62.81)   Activity Tolerance Patient limited by fatigue   Patient Left in bed;with call bell/phone within reach;with bed alarm set   Nurse Communication Mobility status;Other (comment) (appeared to be nervous)        Time: [id number] OT Time Calculation (min): 19 min  Charges: OT General Charges $OT Visit: 1 Visit OT Treatments $Self Care/Home Management : 8-22 mins  Joeseph Amor OTR/L  Acute Rehab Services  [phone] office number [phone] pager number   Joeseph Amor 06/14/2021, 11:32 AM

## 2021-06-15 DIAGNOSIS — Z515 Encounter for palliative care: Secondary | ICD-10-CM

## 2021-06-15 DIAGNOSIS — E43 Unspecified severe protein-calorie malnutrition: Secondary | ICD-10-CM | POA: Diagnosis not present

## 2021-06-15 DIAGNOSIS — I63312 Cerebral infarction due to thrombosis of left middle cerebral artery: Secondary | ICD-10-CM | POA: Diagnosis not present

## 2021-06-15 DIAGNOSIS — I4821 Permanent atrial fibrillation: Secondary | ICD-10-CM | POA: Diagnosis not present

## 2021-06-15 DIAGNOSIS — G9340 Encephalopathy, unspecified: Secondary | ICD-10-CM | POA: Diagnosis not present

## 2021-06-15 NOTE — Progress Notes (Signed)
Palliative Medicine RN Note: Symptom check.  Pt is unresponsive. Resp rate around 8-10. PAINAD 0.   No hospice bed available today, but they expect to have a bed at [facility]. If she does not d/c, our team will continue to follow.  Marjie Skiff Deacon Gadbois, RN, BSN, [facility] Palliative Medicine Team 06/15/2021 2:46 PM Office [phone]

## 2021-06-15 NOTE — Progress Notes (Signed)
PROGRESS NOTE    Heather Saunders  [id number] DOB: 30-Apr-1925 DOA: 05/31/2021 PCP: Kathyrn Lass, MD    Brief Narrative:  87 year old female with history of chronic A. fib on Eliquis, pulmonary hypertension, chronic kidney disease stage IV, anemia of chronic disease and debility brought to emergency room with confusion as noted by home health aide and also noted to have slurred speech.  Work-up in the emergency room with head CT unremarkable.  MRI consistent with acute small vessel disease stroke.  Hospital course complicated with persistent weakness, uncontrolled hypertension.  Acute kidney injury with underlying chronic kidney disease. Renal functions continue to worsen.  Mental status continues to worsen. 9/15, converted to comfort care measures.   Assessment & Plan:   Principal Problem:   End of life care Active Problems:   Essential hypertension   Pulmonary HTN ([place])   Acute encephalopathy   Atrial fibrillation (HCC)   CVA (cerebral vascular accident) ([place])   CKD (chronic kidney disease), stage IV (HCC)   Chronic diastolic congestive heart failure (HCC)   Protein-calorie malnutrition, severe   Pressure injury of skin  Acute ischemic stroke with metabolic encephalopathy  Acute kidney injury on chronic kidney disease stage IV  Acute on chronic diastolic heart failure, fluid overload  Failure to thrive  Severe physical debility  End-of-life care   Patient was treated in the hospital with optimal medical therapy with no improvement.  Her mental status continued to worsen.  Her kidney functions continue to worsen and developed uremic encephalopathy. Given overall poor clinical status, started on end-of-life care on 9/15. All symptom control medications are available.  Benzodiazepines.  Opiates for pain relief.  As needed IV and oral medication administration. DNR/DNI, comfort care RN to pronounce death if happens in the hospital Transfer to inpatient hospice if bed  available, otherwise hospital death is anticipated.   DVT prophylaxis:   Comfort care   Code Status: Comfort care Family Communication: None today Disposition Plan: Status is: Inpatient  Remains inpatient appropriate because:Inpatient level of care appropriate due to severity of illness  Dispo:  Patient From: Home  Planned Disposition: Inpatient hospice.  Medically stable for discharge: No, only to hospice level of care           Consultants:  Palliative team Nephrology Cardiology Neurology  Procedures:  None  Antimicrobials:  None   Subjective: Patient seen and examined.  Unable to interact.  Lethargic and was looking uncomfortable.  She did not respond to stimuli.  Objective: Vitals:   06/14/21 1222 06/14/21 1712 06/14/21 1950 06/15/21 0805  BP: (!) 145/95 (!) 163/84 (!) 174/101 (!) 148/93  Pulse: 75 61 83 92  Resp: '18 18 18 17  '$ Temp: 98.4 F (36.9 C) 97.7 F (36.5 C) 97.6 F (36.4 C) 97.6 F (36.4 C)  TempSrc: Axillary Oral Axillary Axillary  SpO2: 100% 98% 93% 95%  Weight:      Height:       No intake or output data in the 24 hours ending 06/15/21 1247  Filed Weights   06/11/21 0623 06/12/21 0500 06/14/21 0500  Weight: 49.6 kg 52 kg 50.6 kg    Examination:  General: Frail.  Debilitated.  Cachectic. Mostly sleepy and lethargic.  Occasionally uncomfortable and moaning. Unable to interact. Cardiovascular: S1-S2 normal.  Tachycardic. Respiratory: Conducted upper airway sounds. Gastrointestinal: Soft and nontender. Ext: No edema or swelling.  .      Data Reviewed: I have personally reviewed following labs and imaging studies  CBC: Recent Labs  Lab 06/10/21 1123 06/12/21 0631 06/13/21 0225 06/14/21 0355  WBC 12.1* 11.9* 16.0* 19.6*  NEUTROABS 7.2  --   --   --   HGB 12.0 10.0* 10.2* 10.1*  HCT 37.1 31.1* 31.7* 32.0*  MCV 87.7 87.1 86.4 86.7  PLT 243 210 250 [id number]   Basic Metabolic Panel: Recent Labs  Lab 06/11/21 0948  06/11/21 1844 06/12/21 0631 06/13/21 0225 06/14/21 0355  NA 139 137 138 137 138  K 4.5 4.6 4.8 5.1 5.2*  CL 101 103 104 102 103  CO2 21* 22 20* 20* 19*  GLUCOSE 199* 228* 134* 142* 179*  BUN 83* 91* 90* 98* 113*  CREATININE 4.04* 4.54* 4.49* 4.88* 5.95*  CALCIUM 9.1 9.1 9.2 9.3 9.5  PHOS  --   --   --  6.4* 7.2*   GFR: Estimated Creatinine Clearance: 4.6 mL/min (A) (by C-G formula based on SCr of 5.95 mg/dL (H)). Liver Function Tests: Recent Labs  Lab 06/12/21 0631 06/13/21 0225 06/14/21 0355  AST 35  --   --   ALT 33  --   --   ALKPHOS 87  --   --   BILITOT 2.2*  --   --   PROT 5.8*  --   --   ALBUMIN 3.6 3.5 4.0   No results for input(s): LIPASE, AMYLASE in the last 168 hours. No results for input(s): AMMONIA in the last 168 hours. Coagulation Profile: No results for input(s): INR, PROTIME in the last 168 hours. Cardiac Enzymes: No results for input(s): CKTOTAL, CKMB, CKMBINDEX, TROPONINI in the last 168 hours. BNP (last 3 results) No results for input(s): PROBNP in the last 8760 hours. HbA1C: No results for input(s): HGBA1C in the last 72 hours. CBG: Recent Labs  Lab 06/10/21 0821  GLUCAP 132*   Lipid Profile: No results for input(s): CHOL, HDL, LDLCALC, TRIG, CHOLHDL, LDLDIRECT in the last 72 hours. Thyroid Function Tests: No results for input(s): TSH, T4TOTAL, FREET4, T3FREE, THYROIDAB in the last 72 hours. Anemia Panel: No results for input(s): VITAMINB12, FOLATE, FERRITIN, TIBC, IRON, RETICCTPCT in the last 72 hours. Sepsis Labs: Recent Labs  Lab 06/10/21 1123  PROCALCITON 0.32    Recent Results (from the past 240 hour(s))  SARS CORONAVIRUS 2 (TAT 6-24 HRS) Nasopharyngeal Nasopharyngeal Swab     Status: None   Collection Time: 06/08/21 11:34 AM   Specimen: Nasopharyngeal Swab  Result Value Ref Range Status   SARS Coronavirus 2 NEGATIVE NEGATIVE Final    Comment: (NOTE) SARS-CoV-2 target nucleic acids are NOT DETECTED.  The SARS-CoV-2 RNA is  generally detectable in upper and lower respiratory specimens during the acute phase of infection. Negative results do not preclude SARS-CoV-2 infection, do not rule out co-infections with other pathogens, and should not be used as the sole basis for treatment or other patient management decisions. Negative results must be combined with clinical observations, patient history, and epidemiological information. The expected result is Negative.  Fact Sheet for Patients: SugarRoll.be  Fact Sheet for Healthcare Providers: https://www.woods-mathews.com/  This test is not yet approved or cleared by the [place] FDA and  has been authorized for detection and/or diagnosis of SARS-CoV-2 by FDA under an Emergency Use Authorization (EUA). This EUA will remain  in effect (meaning this test can be used) for the duration of the COVID-19 declaration under Se ction 564(b)(1) of the Act, 21 U.S.C. section 360bbb-3(b)(1), unless the authorization is terminated or revoked sooner.  Performed at [facility], [place]  [address], [place], [place] [address]   Urine Culture     Status: None   Collection Time: 06/13/21  3:57 PM   Specimen: In/Out Cath Urine  Result Value Ref Range Status   Specimen Description IN/OUT CATH URINE  Final   Special Requests NONE  Final   Culture   Final    NO GROWTH Performed at [facility], [place] [address], [place], [place] [address]    Report Status 06/14/2021 FINAL  Final  SARS CORONAVIRUS 2 (TAT 6-24 HRS) Nasopharyngeal Nasopharyngeal Swab     Status: None   Collection Time: 06/13/21  5:10 PM   Specimen: Nasopharyngeal Swab  Result Value Ref Range Status   SARS Coronavirus 2 NEGATIVE NEGATIVE Final    Comment: (NOTE) SARS-CoV-2 target nucleic acids are NOT DETECTED.  The SARS-CoV-2 RNA is generally detectable in upper and lower respiratory specimens during the acute phase of infection. Negative results do not  preclude SARS-CoV-2 infection, do not rule out co-infections with other pathogens, and should not be used as the sole basis for treatment or other patient management decisions. Negative results must be combined with clinical observations, patient history, and epidemiological information. The expected result is Negative.  Fact Sheet for Patients: SugarRoll.be  Fact Sheet for Healthcare Providers: https://www.woods-mathews.com/  This test is not yet approved or cleared by the [place] FDA and  has been authorized for detection and/or diagnosis of SARS-CoV-2 by FDA under an Emergency Use Authorization (EUA). This EUA will remain  in effect (meaning this test can be used) for the duration of the COVID-19 declaration under Se ction 564(b)(1) of the Act, 21 U.S.C. section 360bbb-3(b)(1), unless the authorization is terminated or revoked sooner.  Performed at [facility], [place] [address] [place], [place] [address]          Radiology Studies: No results found.      Scheduled Meds:   stroke: mapping our early stages of recovery book   Does not apply Once   senna-docusate  2 tablet Oral BID   Continuous Infusions:   LOS: 14 days    Time spent: 30 minutes    Barb Merino, MD [facility] Pager [phone]

## 2021-06-15 NOTE — Progress Notes (Signed)
Manufacturing engineer [facility]) [facility] Liaison Note  Visited with patient at bedside, she was sleeping softly and did not wake to verbal stimuli. She did not appear to be in any distress.   [place] will have a bed available tomorrow and at this time the patient's nephew has accepted that bed.   [place] liaison will follow up tomorrow to aide in setting up transport. Please send signed DNR along with patient. Nurse can call report tomorrow to [phone]  Please don't hesitate to call for any hospice related questions or concerns Jhonnie Garner, BSN, Therapist, sports, [employer] [phone]

## 2021-06-15 NOTE — Progress Notes (Signed)
Patient has transitioned to comfort care. Case discussed with primary service. Thank you for allowing [place] to participate in the care of this patient. Please call with any questions/concerns.  Gean Quint, MD [facility]

## 2021-06-16 DIAGNOSIS — Z515 Encounter for palliative care: Secondary | ICD-10-CM | POA: Diagnosis not present

## 2021-06-16 NOTE — Progress Notes (Signed)
Patient stable, comfortable. Followed up with [facility], no ETA given yet. Will follow up again later.

## 2021-06-16 NOTE — TOC Transition Note (Addendum)
Transition of Care [facility]) - CM/SW Discharge Note   Patient Details  Name: Heather Saunders MRN: [id number] Date of Birth: 1925-04-27  [facility]) CM/SW Contact:  Bary Castilla, LCSW Phone Number: [phone] 06/16/2021, 2:13 PM   Clinical Narrative:     Patient will DC to: [facility] date:?06/16/2021 Family notified: Lorri Frederick by: Corey Harold   Per MD patient ready for DC to [facility]. RN, patient, patient's family, and facility notified of DC. Discharge Summary sent to facility. RN given number for report  [id number]. DC packet on chart. Ambulance transport requested for patient for 4pm time.   CSW signing off.   [place], [place] [phone]     Barriers to Discharge: Other (must enter comment)   Patient Goals and CMS Choice Patient states their goals for this hospitalization and ongoing recovery are:: "I want to go home. But I'll think about it." CMS Medicare.gov Compare Post Acute Care list provided to:: Patient Choice offered to / list presented to :  ([name])  Discharge Placement                       Discharge Plan and Services   Discharge Planning Services: CM Consult Post Acute Care Choice: NA            DME Agency: NA       HH Arranged: NA          Social Determinants of Health (SDOH) Interventions     Readmission Risk Interventions No flowsheet data found.

## 2021-06-16 NOTE — Progress Notes (Signed)
   Palliative Medicine Inpatient Follow Up Note   Consulting Provider: Cindee Salt, RN   Reason for consult:   [place] Palliative Medicine Consult  Reason for Consult? family goals of care    HPI:  Per intake H&P --> Patient 87 year old female history of A. fib on Eliquis, pulmonary hypertension, chronic kidney disease stage IV, anemia, presented to the ED with confusion as noted by home health aide with some associated slurred speech.  Work-up in the ED with head CT being unremarkable, MRI head consistent with acute CVA.  Patient admitted for stroke work-up.  Hospital course complicated by generalized weakness for which PT OT recommended SNF placement.     Palliative care was asked to get involved to discuss GOC in the setting of an acute stroke, multiple co-morbidites, and a prolonged hospitalization.   Today's Discussion (06/16/2021):  *Please note that this is a verbal dictation therefore any spelling or grammatical errors are due to the "[employer]" system interpretation.  Chart reviewed.  Vital signs appear stable at this time.    Nursing staff has requested that the palliative care team stop by and of concern that patient is encroaching the more imminent phase of the dying process.  I met at bedside with [name] she was able to open her eyes when I called her name.  Her extremities were all warm to the touch and it does not appear that she is yet started to mottle.  [place] does appear stable to transition to [facility] this afternoon.  This information will be shared with the nursing and case management staff for further care coordination.  Questions and concerns addressed   Objective Assessment: Vital Signs Vitals:   06/16/21 0833 06/16/21 0835  BP:    Pulse:    Resp:    Temp: 98.3 F (36.8 C) 98.3 F (36.8 C)  SpO2:     No intake or output data in the 24 hours ending 06/16/21 1142 Last Weight  Most recent update:  06/14/2021  6:18 AM    Weight  50.6 kg (111 lb 8.8 oz)            Gen:  Frail elderly F in NAD HEENT: Dry mucous membranes CV: Irregular rate and rhythm  PULM: On RA ABD: soft/nontender  EXT: No edema Neuro: Alert to self, lethargic  SUMMARY OF RECOMMENDATIONS   DNAR/DNI  Comfort care  Comfort medications per [employer]  Transition to [facility] this afternoon  Time Spent: 25 Greater than 50% of the time was spent in counseling and coordination of care ______________________________________________________________________________________ [place] Team Team Cell Phone: [phone] Please utilize secure chat with additional questions, if there is no response within 30 minutes please call the above phone number  Palliative Medicine Team providers are available by phone from 7am to 7pm daily and can be reached through the team cell phone.  Should this patient require assistance outside of these hours, please call the patient's attending physician.

## 2021-06-16 NOTE — Progress Notes (Signed)
Manufacturing engineer [facility]) Hospital Liaison Note  [facility] has a room available for patient today with requested arrival after 4 pm. Family agreeable to transfer today. Vallery Ridge, LCSW [facility] Manager aware.   RN please call report to [facility] at [phone] prior to patient leaving the unit.  Please send signed and completed DNR with patient at discharge.   Please do not hesitate to call with any hospice related questions.    Thank you for the opportunity to participate in this patient's care.   Bobbie "Loren Racer, RN, BSN [facility] Liaison [phone]

## 2021-06-16 NOTE — Progress Notes (Signed)
Patient has ordered for discharge to the [facility] place. Report given to the Nurse. Waiting for PTAR. Will aware next shift Nurse.

## 2021-06-16 NOTE — Discharge Summary (Signed)
Physician Discharge Summary  Heather Saunders [id number] DOB: [birth year] DOA: 05/31/2021  PCP: Kathyrn Lass, MD  Admit date: 05/31/2021 Discharge date: 06/16/2021  Admitted From: Home Disposition: Hospice home  Recommendations for Outpatient Follow-up:  As per hospice provider   Discharge Condition: Critical CODE STATUS: Comfort care Diet recommendation: Comfort feeding  Discharge summary: 87 year old female with history of chronic A. fib on Eliquis, pulmonary hypertension, chronic kidney disease stage IV, anemia of chronic disease and debility brought to emergency room with confusion as noted by home health aide and also noted to have slurred speech.  Work-up in the emergency room with head CT unremarkable.  MRI consistent with acute small vessel disease stroke.  Hospital course complicated with persistent weakness, uncontrolled hypertension.  Acute kidney injury with underlying chronic kidney disease. Renal functions continue to worsen.  Mental status continues to worsen. 9/15, converted to comfort care measures.     Assessment & Plan:   Principal Problem:   End of life care Active Problems:   Essential hypertension   Pulmonary HTN ([place])   Acute encephalopathy   Atrial fibrillation (HCC)   CVA (cerebral vascular accident) ([place])   CKD (chronic kidney disease), stage IV (HCC)   Chronic diastolic congestive heart failure (HCC)   Protein-calorie malnutrition, severe   Pressure injury of skin   Acute ischemic stroke with metabolic encephalopathy  Acute kidney injury on chronic kidney disease stage IV  Acute on chronic diastolic heart failure, fluid overload  Failure to thrive  Severe physical debility  End-of-life care    Patient was treated in the hospital with optimal medical therapy with no improvement.  Her mental status continued to worsen.  Her kidney functions continue to worsen and developed uremic encephalopathy. Given overall poor clinical status, started on  end-of-life care on 9/15. All symptom control medications are available.  Benzodiazepines.  Opiates for pain relief.  As needed IV and oral medication administration. DNR/DNI, comfort care RN to pronounce death if happens in the hospital Transfer to inpatient hospice if bed available, otherwise hospital death is anticipated. Patient will be medicated for comfort before transferring to hospice home in the medical transport.  Discharge Diagnoses:  Principal Problem:   End of life care Active Problems:   Essential hypertension   Pulmonary HTN ([place])   Acute encephalopathy   Atrial fibrillation (HCC)   CVA (cerebral vascular accident) ([place])   CKD (chronic kidney disease), stage IV (HCC)   Chronic diastolic congestive heart failure ([place])   Protein-calorie malnutrition, severe   Pressure injury of skin    Discharge Instructions  Discharge Instructions     No wound care   Complete by: As directed       Allergies as of 06/16/2021       Reactions   Boniva [ibandronic Acid]    Sore throat   Fosamax [alendronate Sodium]    Sore throat        Medication List     STOP taking these medications    apixaban 2.5 MG Tabs tablet Commonly known as: ELIQUIS   atorvastatin 40 MG tablet Commonly known as: LIPITOR   diltiazem 120 MG 24 hr capsule Commonly known as: CARDIZEM CD   ezetimibe 10 MG tablet Commonly known as: ZETIA   furosemide 20 MG tablet Commonly known as: Lasix   metoprolol tartrate 100 MG tablet Commonly known as: LOPRESSOR   multivitamin capsule   polyethylene glycol 17 g packet Commonly known as: MIRALAX / GLYCOLAX   sodium bicarbonate 650 MG tablet  Vitamin D3 50 MCG (2000 UT) capsule       TAKE these medications    acetaminophen 500 MG tablet Commonly known as: TYLENOL Take 500 mg by mouth every 6 (six) hours as needed for mild pain.   hydrALAZINE 25 MG tablet Commonly known as: APRESOLINE Take 3 tablets (75 mg total) by mouth every 8  (eight) hours.        Follow-up Information     [facility]. Schedule an appointment as soon as possible for a visit in 1 month(s).   Specialty: Neurology Why: stroke clinic Contact information: [place] [phone]        Kathyrn Lass, MD. Call today.   Specialty: Family Medicine Why: Please call for a posthospital follow-up Contact information: [place] [address] [phone]         Fay Records, MD .   Specialty: Cardiology Contact information: [place] [address] [phone]                Allergies  Allergen Reactions   Boniva [Ibandronic Acid]     Sore throat   Fosamax [Alendronate Sodium]     Sore throat    Consultations: Neurology Palliative and hospice Nephrology   Procedures/Studies: CT HEAD WO CONTRAST (5MM)  Result Date: 06/01/2021 CLINICAL DATA:  Neuro deficit EXAM: CT HEAD WITHOUT CONTRAST TECHNIQUE: Contiguous axial images were obtained from the base of the skull through the vertex without intravenous contrast. COMPARISON:  CT head 05/31/2021 FINDINGS: Brain: There is hypodensity centered in the left caudate body extending towards the lentiform nucleus consistent with evolving subacute infarct as seen on the prior MRI. There is no evidence of hemorrhagic conversion. There is no new infarct. There is no evidence of acute intracranial hemorrhage or extra-axial fluid collection. Mild parenchymal volume loss is unchanged. Remote infarcts in the bilateral basal ganglia are unchanged. There is no mass lesion. There is no midline shift. The ventricles are stable in size. Vascular: No hyperdense vessel or unexpected calcification. Skull: Normal. Negative for fracture or focal lesion. Sinuses/Orbits: The paranasal sinuses are clear. Bilateral lens implants are in place. The globes and orbits are otherwise unremarkable.  Other: None. IMPRESSION: 1. Evolving subacute infarct in the left caudate body without evidence of hemorrhagic transformation. 2. No new infarct or hemorrhage. Electronically Signed   By: Valetta Mole M.D.   On: 06/01/2021 14:37   CT HEAD WO CONTRAST  Result Date: 05/31/2021 CLINICAL DATA:  Altered mental status. EXAM: CT HEAD WITHOUT CONTRAST TECHNIQUE: Contiguous axial images were obtained from the base of the skull through the vertex without intravenous contrast. COMPARISON:  None. FINDINGS: Brain: Mild age-related atrophy and chronic microvascular ischemic changes. There is no acute intracranial hemorrhage. No mass effect midline shift. No extra-axial fluid collection. Vascular: No hyperdense vessel or unexpected calcification. Skull: Normal. Negative for fracture or focal lesion. Sinuses/Orbits: No acute finding. Other: None IMPRESSION: 1. No acute intracranial pathology. 2. Mild age-related atrophy and chronic microvascular ischemic changes. Electronically Signed   By: Anner Crete M.D.   On: 05/31/2021 19:19   MR ANGIO HEAD WO CONTRAST  Result Date: 05/31/2021 CLINICAL DATA:  Acute neurologic deficit EXAM: MRI HEAD WITHOUT CONTRAST MRA HEAD WITHOUT CONTRAST TECHNIQUE: Multiplanar, multi-echo pulse sequences of the brain and surrounding structures were acquired without intravenous contrast. Angiographic images of the Circle of Willis were acquired using MRA technique without intravenous contrast. COMPARISON:  No pertinent prior exam. 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 No acute or chronic hemorrhage. There is multifocal hyperintense T2-weighted signal within the white matter. Generalized volume loss without a clear lobar predilection. The midline structures are normal. Vascular: Major flow voids are preserved. Skull and upper cervical spine: Normal calvarium and skull base. Visualized upper cervical spine and soft tissues are normal. Sinuses/Orbits:No  paranasal sinus fluid levels or advanced mucosal thickening. No mastoid or middle ear effusion. Normal orbits. MRA HEAD FINDINGS POSTERIOR CIRCULATION: --Vertebral arteries: Normal --Inferior cerebellar arteries: Normal. --Basilar artery: Normal. --Superior cerebellar arteries: Normal. --Posterior cerebral arteries: Normal. ANTERIOR CIRCULATION: --Intracranial internal carotid arteries: Normal. --Anterior cerebral arteries (ACA): Normal. --Middle cerebral arteries (MCA): Normal. ANATOMIC VARIANTS: Fetal origin of both posterior cerebral arteries. IMPRESSION: 1. 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 No hemorrhage or mass effect. 2. Normal intracranial MRA. Electronically Signed   By: Ulyses Jarred M.D.   On: 05/31/2021 22:26   MR BRAIN WO CONTRAST  Result Date: 05/31/2021 CLINICAL DATA:  Acute neurologic deficit EXAM: MRI HEAD WITHOUT CONTRAST MRA HEAD WITHOUT CONTRAST TECHNIQUE: Multiplanar, multi-echo pulse sequences of the brain and surrounding structures were acquired without intravenous contrast. Angiographic images of the Circle of Willis were acquired using MRA technique without intravenous contrast. COMPARISON:  No pertinent prior exam. 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 No acute or chronic hemorrhage. There is multifocal hyperintense T2-weighted signal within the white matter. Generalized volume loss without a clear lobar predilection. The midline structures are normal. Vascular: Major flow voids are preserved. Skull and upper cervical spine: Normal calvarium and skull base. Visualized upper cervical spine and soft tissues are normal. Sinuses/Orbits:No paranasal sinus fluid levels or advanced mucosal thickening. No mastoid or middle ear effusion. Normal orbits. MRA HEAD FINDINGS POSTERIOR CIRCULATION: --Vertebral arteries: Normal --Inferior cerebellar arteries: Normal. --Basilar artery: Normal. --Superior cerebellar arteries: Normal. --Posterior  cerebral arteries: Normal. ANTERIOR CIRCULATION: --Intracranial internal carotid arteries: Normal. --Anterior cerebral arteries (ACA): Normal. --Middle cerebral arteries (MCA): Normal. ANATOMIC VARIANTS: Fetal origin of both posterior cerebral arteries. IMPRESSION: 1. 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 No hemorrhage or mass effect. 2. Normal intracranial MRA. Electronically Signed   By: Ulyses Jarred M.D.   On: 05/31/2021 22:26   US RENAL  Result Date: 06/12/2021 CLINICAL DATA:  Acute renal injury EXAM: RENAL / URINARY TRACT ULTRASOUND COMPLETE COMPARISON:  None. FINDINGS: Right Kidney: Renal measurements: 9.2 x 3.8 x 5.2 cm. = volume: 96 mL. Echogenicity within normal limits. No mass or hydronephrosis visualized. Left Kidney: Renal measurements: 9.4 x 3.9 x 3.8 = volume: 73 mL. 1.1 cm cyst in the lower pole. Bladder: Appears normal for degree of bladder distention. Other: Bilateral pleural effusions are seen. IMPRESSION: Small left renal cyst. No obstructive changes are noted. Bilateral pleural effusions. Electronically Signed   By: Inez Catalina M.D.   On: 06/12/2021 13:52   DG CHEST PORT 1 VIEW  Result Date: 06/10/2021 CLINICAL DATA:  Shortness of breath EXAM: PORTABLE CHEST 1 VIEW COMPARISON:  March 22, 2021 FINDINGS: The mediastinal contour is stable. The heart size enlarged. Increased pulmonary interstitium is identified bilaterally unchanged. Patchy consolidation of bilateral lung bases with bilateral pleural effusions unchanged. Bony structures are stable. IMPRESSION: 1. Congestive heart failure. 2. Patchy consolidation of bilateral lung bases with bilateral pleural effusions unchanged. Electronically Signed   By: Abelardo Diesel M.D.   On: 06/10/2021 09:54   ECHOCARDIOGRAM  COMPLETE  Result Date: 06/01/2021    ECHOCARDIOGRAM REPORT   Patient Name:   Heather Saunders Date of Exam: 06/01/2021 Medical Rec #:  [id number]        Height:       63.5 in Accession #:    [id number]       Weight:        109.0 lb Date of Birth:  11-07-1924        BSA:          1.503 m Patient Age:    37 years         BP:           158/87 mmHg Patient Gender: F                HR:           70 bpm. Exam Location:  Inpatient Procedure: 2D Echo, Cardiac Doppler and Color Doppler Indications:    Stroke I63.9  History:        Patient has prior history of Echocardiogram examinations, most                 recent 05/16/2021. Previous Myocardial Infarction; Risk                 Factors:Dyslipidemia and Hypertension.  Sonographer:    Bernadene Person RDCS Referring Phys: [place]  1. Left ventricular ejection fraction, by estimation, is 55 to 60%. The left ventricle has normal function. The left ventricle has no regional wall motion abnormalities. Left ventricular diastolic parameters are consistent with Grade I diastolic dysfunction (impaired relaxation).  2. Right ventricular systolic function is mildly reduced. The right ventricular size is normal. There is mildly elevated pulmonary artery systolic pressure.  3. The mitral valve is grossly normal. No evidence of mitral valve regurgitation.  4. The aortic valve is tricuspid. Aortic valve regurgitation is trivial. Aortic valve sclerosis is present, with no evidence of aortic valve stenosis.  5. The inferior vena cava is dilated in size with >50% respiratory variability, suggesting right atrial pressure of 8 mmHg.  6. Tricuspid valve regurgitation is mild to moderate. Comparison(s): A prior study was performed on 05/16/21. Prior RVSP 65 mm Hg. FINDINGS  Left Ventricle: Left ventricular ejection fraction, by estimation, is 55 to 60%. The left ventricle has normal function. The left ventricle has no regional wall motion abnormalities. The left ventricular internal cavity size was small. There is no left ventricular hypertrophy. Left ventricular diastolic parameters are consistent with Grade I diastolic dysfunction (impaired relaxation). Right Ventricle: The right  ventricular size is normal. No increase in right ventricular wall thickness. Right ventricular systolic function is mildly reduced. There is mildly elevated pulmonary artery systolic pressure. The tricuspid regurgitant velocity  is 2.78 m/s, and with an assumed right atrial pressure of 8 mmHg, the estimated right ventricular systolic pressure is [id number]. Left Atrium: Left atrial size was normal in size. Right Atrium: Right atrial size was normal in size. Pericardium: Trivial pericardial effusion is present. Mitral Valve: The mitral valve is grossly normal. No evidence of mitral valve regurgitation. Tricuspid Valve: The tricuspid valve is normal in structure. Tricuspid valve regurgitation is mild to moderate. Aortic Valve: The aortic valve is tricuspid. There is mild calcification of the aortic valve. There is mild thickening of the aortic valve. Aortic valve regurgitation is trivial. Aortic regurgitation PHT measures 932 msec. Mild aortic valve sclerosis is present, with no evidence of aortic valve stenosis. Pulmonic Valve:  The pulmonic valve was not well visualized. Pulmonic valve regurgitation is mild to moderate. No evidence of pulmonic stenosis. Aorta: The aortic root and ascending aorta are structurally normal, with no evidence of dilitation. Venous: The inferior vena cava is dilated in size with greater than 50% respiratory variability, suggesting right atrial pressure of 8 mmHg. IAS/Shunts: The atrial septum is grossly normal.  LEFT VENTRICLE PLAX 2D LVIDd:         3.10 cm  Diastology LVIDs:         2.20 cm  LV e' medial:    3.09 cm/s LV PW:         1.10 cm  LV E/e' medial:  11.4 LV IVS:        0.90 cm  LV e' lateral:   3.32 cm/s LVOT diam:     2.00 cm  LV E/e' lateral: 10.6 LV SV:         54 LV SV Index:   36 LVOT Area:     3.14 cm  RIGHT VENTRICLE RV S prime:     6.98 cm/s TAPSE (M-mode): 1.3 cm LEFT ATRIUM             Index       RIGHT ATRIUM           Index LA diam:        3.20 cm 2.13 cm/m  RA Area:      11.20 cm LA Vol (A2C):   38.8 ml 25.82 ml/m RA Volume:   25.30 ml  16.84 ml/m LA Vol (A4C):   38.7 ml 25.75 ml/m LA Biplane Vol: 39.5 ml 26.28 ml/m  AORTIC VALVE             PULMONIC VALVE LVOT Vmax:   88.93 cm/s  PR End Diast Vel: 9.61 msec LVOT Vmean:  60.933 cm/s LVOT VTI:    0.171 m AI PHT:      932 msec  AORTA Ao Root diam: 3.10 cm Ao Asc diam:  3.30 cm MITRAL VALVE               TRICUSPID VALVE MV Area (PHT): 3.72 cm    TR Peak grad:   30.9 mmHg MV Decel Time: 204 msec    TR Vmax:        278.00 cm/s MV E velocity: 35.20 cm/s MV A velocity: 84.20 cm/s  SHUNTS MV E/A ratio:  0.42        Systemic VTI:  0.17 m                            Systemic Diam: 2.00 cm Rudean Haskell MD Electronically signed by Rudean Haskell MD Signature Date/Time: 06/01/2021/12:16:58 PM    Final    VAS US CAROTID (at [facility] and [facility] only)  Result Date: 06/01/2021 Carotid Arterial Duplex Study Patient Name:  Heather Saunders  Date of Exam:   06/01/2021 Medical Rec #: [id number]         Accession #:    [id number] Date of Birth: [birth year]         Patient Gender: F Patient Age:   39 years Exam Location:  [facility] Procedure:      VAS US CAROTID Referring Phys: Gean Birchwood --------------------------------------------------------------------------------  Indications:  CVA. Risk Factors: Hypertension, hyperlipidemia, coronary artery disease. Performing Technologist: Archie Patten RVS  Examination Guidelines: A complete evaluation includes B-mode imaging, spectral Doppler, color Doppler, and power Doppler as needed  of all accessible portions of each vessel. Bilateral testing is considered an integral part of a complete examination. Limited examinations for reoccurring indications may be performed as noted.  Right Carotid Findings: +----------+--------+--------+--------+------------------+--------+           PSV cm/sEDV cm/sStenosisPlaque DescriptionComments  +----------+--------+--------+--------+------------------+--------+ CCA Prox  30      8               heterogenous               +----------+--------+--------+--------+------------------+--------+ CCA Distal25      7               heterogenous               +----------+--------+--------+--------+------------------+--------+ ICA Prox  57      20      1-39%   heterogenous               +----------+--------+--------+--------+------------------+--------+ ICA Distal34      11                                         +----------+--------+--------+--------+------------------+--------+ ECA       52      6                                          +----------+--------+--------+--------+------------------+--------+ +----------+--------+-------+--------+-------------------+           PSV cm/sEDV cmsDescribeArm Pressure (mmHG) +----------+--------+-------+--------+-------------------+ [id number]                                         +----------+--------+-------+--------+-------------------+ +---------+--------+--+--------+-+---------+ VertebralPSV cm/s21EDV cm/s5Antegrade +---------+--------+--+--------+-+---------+  Left Carotid Findings: +----------+--------+--------+--------+------------------+--------+           PSV cm/sEDV cm/sStenosisPlaque DescriptionComments +----------+--------+--------+--------+------------------+--------+ CCA Prox  31      7               heterogenous               +----------+--------+--------+--------+------------------+--------+ CCA Distal28      6               heterogenous               +----------+--------+--------+--------+------------------+--------+ ICA Prox  24      10      1-39%   heterogenous               +----------+--------+--------+--------+------------------+--------+ ICA Distal25      6                                          +----------+--------+--------+--------+------------------+--------+  ECA       44                                                 +----------+--------+--------+--------+------------------+--------+ +----------+--------+--------+--------+-------------------+           PSV cm/sEDV cm/sDescribeArm Pressure (mmHG) +----------+--------+--------+--------+-------------------+ [id number]                                          +----------+--------+--------+--------+-------------------+ +---------+--------+--+--------+-+---------+  VertebralPSV cm/s22EDV cm/s5Antegrade +---------+--------+--+--------+-+---------+   Summary: Right Carotid: Velocities in the right ICA are consistent with a 1-39% stenosis. Left Carotid: Velocities in the left ICA are consistent with a 1-39% stenosis. Vertebrals: Bilateral vertebral arteries demonstrate antegrade flow. *See table(s) above for measurements and observations.  Electronically signed by Antony Contras MD on 06/01/2021 at 5:17:25 PM.    Final    (Echo, Carotid, EGD, Colonoscopy, ERCP)    Subjective: Patient seen and examined.  Overnight remained stable.  Sparsely medicated.  She was not able to respond, however responded to strong stimuli.  Looks comfortable.   Discharge Exam: Vitals:   06/16/21 0833 06/16/21 0835  BP:    Pulse:    Resp:    Temp: 98.3 F (36.8 C) 98.3 F (36.8 C)  SpO2:     Vitals:   06/15/21 0805 06/16/21 0148 06/16/21 0833 06/16/21 0835  BP: (!) 148/93 (!) 166/92    Pulse: 92 91    Resp: 17 14    Temp: 97.6 F (36.4 C) 98.5 F (36.9 C) 98.3 F (36.8 C) 98.3 F (36.8 C)  TempSrc: Axillary Axillary Axillary Axillary  SpO2: 95% 96%    Weight:      Height:       Mostly sleepy.  Obtunded.  No spontaneous movements.  On minimal oxygen support.  Looks comfortable. Arms and limbs are warm to touch and well-perfused.    The results of significant diagnostics from this hospitalization (including imaging, microbiology, ancillary and laboratory) are listed below for reference.      Microbiology: Recent Results (from the past 240 hour(s))  SARS CORONAVIRUS 2 (TAT 6-24 HRS) Nasopharyngeal Nasopharyngeal Swab     Status: None   Collection Time: 06/08/21 11:34 AM   Specimen: Nasopharyngeal Swab  Result Value Ref Range Status   SARS Coronavirus 2 NEGATIVE NEGATIVE Final    Comment: (NOTE) SARS-CoV-2 target nucleic acids are NOT DETECTED.  The SARS-CoV-2 RNA is generally detectable in upper and lower respiratory specimens during the acute phase of infection. Negative results do not preclude SARS-CoV-2 infection, do not rule out co-infections with other pathogens, and should not be used as the sole basis for treatment or other patient management decisions. Negative results must be combined with clinical observations, patient history, and epidemiological information. The expected result is Negative.  Fact Sheet for Patients: SugarRoll.be  Fact Sheet for Healthcare Providers: https://www.woods-mathews.com/  This test is not yet approved or cleared by the [place] FDA and  has been authorized for detection and/or diagnosis of SARS-CoV-2 by FDA under an Emergency Use Authorization (EUA). This EUA will remain  in effect (meaning this test can be used) for the duration of the COVID-19 declaration under Se ction 564(b)(1) of the Act, 21 U.S.C. section 360bbb-3(b)(1), unless the authorization is terminated or revoked sooner.  Performed at [facility], [place] [address] [place], [place] [address]   Urine Culture     Status: None   Collection Time: 06/13/21  3:57 PM   Specimen: In/Out Cath Urine  Result Value Ref Range Status   Specimen Description IN/OUT CATH URINE  Final   Special Requests NONE  Final   Culture   Final    NO GROWTH Performed at [facility], [place] [address], [place], [place] [address]    Report Status 06/14/2021 FINAL  Final  SARS CORONAVIRUS 2 (TAT 6-24 HRS) Nasopharyngeal  Nasopharyngeal Swab     Status: None   Collection Time: 06/13/21  5:10 PM  Specimen: Nasopharyngeal Swab  Result Value Ref Range Status   SARS Coronavirus 2 NEGATIVE NEGATIVE Final    Comment: (NOTE) SARS-CoV-2 target nucleic acids are NOT DETECTED.  The SARS-CoV-2 RNA is generally detectable in upper and lower respiratory specimens during the acute phase of infection. Negative results do not preclude SARS-CoV-2 infection, do not rule out co-infections with other pathogens, and should not be used as the sole basis for treatment or other patient management decisions. Negative results must be combined with clinical observations, patient history, and epidemiological information. The expected result is Negative.  Fact Sheet for Patients: SugarRoll.be  Fact Sheet for Healthcare Providers: https://www.woods-mathews.com/  This test is not yet approved or cleared by the [place] FDA and  has been authorized for detection and/or diagnosis of SARS-CoV-2 by FDA under an Emergency Use Authorization (EUA). This EUA will remain  in effect (meaning this test can be used) for the duration of the COVID-19 declaration under Se ction 564(b)(1) of the Act, 21 U.S.C. section 360bbb-3(b)(1), unless the authorization is terminated or revoked sooner.  Performed at [facility], [place] [address] [place], [place] [address]      Labs: BNP (last 3 results) Recent Labs    03/18/21 0038 06/10/21 1123  BNP 815.8* [id number]*   Basic Metabolic Panel: Recent Labs  Lab 06/11/21 0948 06/11/21 1844 06/12/21 0631 06/13/21 0225 06/14/21 0355  NA 139 137 138 137 138  K 4.5 4.6 4.8 5.1 5.2*  CL 101 103 104 102 103  CO2 21* 22 20* 20* 19*  GLUCOSE 199* 228* 134* 142* 179*  BUN 83* 91* 90* 98* 113*  CREATININE 4.04* 4.54* 4.49* 4.88* 5.95*  CALCIUM 9.1 9.1 9.2 9.3 9.5  PHOS  --   --   --  6.4* 7.2*   Liver Function Tests: Recent Labs  Lab  06/12/21 0631 06/13/21 0225 06/14/21 0355  AST 35  --   --   ALT 33  --   --   ALKPHOS 87  --   --   BILITOT 2.2*  --   --   PROT 5.8*  --   --   ALBUMIN 3.6 3.5 4.0   No results for input(s): LIPASE, AMYLASE in the last 168 hours. No results for input(s): AMMONIA in the last 168 hours. CBC: Recent Labs  Lab 06/10/21 1123 06/12/21 0631 06/13/21 0225 06/14/21 0355  WBC 12.1* 11.9* 16.0* 19.6*  NEUTROABS 7.2  --   --   --   HGB 12.0 10.0* 10.2* 10.1*  HCT 37.1 31.1* 31.7* 32.0*  MCV 87.7 87.1 86.4 86.7  PLT 243 210 250 299   Cardiac Enzymes: No results for input(s): CKTOTAL, CKMB, CKMBINDEX, TROPONINI in the last 168 hours. BNP: Invalid input(s): POCBNP CBG: Recent Labs  Lab 06/10/21 0821  GLUCAP 132*   D-Dimer No results for input(s): DDIMER in the last 72 hours. Hgb A1c No results for input(s): HGBA1C in the last 72 hours. Lipid Profile No results for input(s): CHOL, HDL, LDLCALC, TRIG, CHOLHDL, LDLDIRECT in the last 72 hours. Thyroid function studies No results for input(s): TSH, T4TOTAL, T3FREE, THYROIDAB in the last 72 hours.  Invalid input(s): FREET3 Anemia work up No results for input(s): VITAMINB12, FOLATE, FERRITIN, TIBC, IRON, RETICCTPCT in the last 72 hours. Urinalysis    Component Value Date/Time   COLORURINE YELLOW 06/11/2021 1609   APPEARANCEUR HAZY (A) 06/11/2021 1609   LABSPEC 1.014 06/11/2021 1609   PHURINE 5.0 06/11/2021 1609   GLUCOSEU NEGATIVE 06/11/2021 1609  HGBUR NEGATIVE 06/11/2021 1609   BILIRUBINUR NEGATIVE 06/11/2021 1609   KETONESUR NEGATIVE 06/11/2021 1609   PROTEINUR 100 (A) 06/11/2021 1609   NITRITE NEGATIVE 06/11/2021 1609   LEUKOCYTESUR NEGATIVE 06/11/2021 1609   Sepsis Labs Invalid input(s): PROCALCITONIN,  WBC,  LACTICIDVEN Microbiology Recent Results (from the past 240 hour(s))  SARS CORONAVIRUS 2 (TAT 6-24 HRS) Nasopharyngeal Nasopharyngeal Swab     Status: None   Collection Time: 06/08/21 11:34 AM   Specimen:  Nasopharyngeal Swab  Result Value Ref Range Status   SARS Coronavirus 2 NEGATIVE NEGATIVE Final    Comment: (NOTE) SARS-CoV-2 target nucleic acids are NOT DETECTED.  The SARS-CoV-2 RNA is generally detectable in upper and lower respiratory specimens during the acute phase of infection. Negative results do not preclude SARS-CoV-2 infection, do not rule out co-infections with other pathogens, and should not be used as the sole basis for treatment or other patient management decisions. Negative results must be combined with clinical observations, patient history, and epidemiological information. The expected result is Negative.  Fact Sheet for Patients: SugarRoll.be  Fact Sheet for Healthcare Providers: https://www.woods-mathews.com/  This test is not yet approved or cleared by the [place] FDA and  has been authorized for detection and/or diagnosis of SARS-CoV-2 by FDA under an Emergency Use Authorization (EUA). This EUA will remain  in effect (meaning this test can be used) for the duration of the COVID-19 declaration under Se ction 564(b)(1) of the Act, 21 U.S.C. section 360bbb-3(b)(1), unless the authorization is terminated or revoked sooner.  Performed at [facility], [place] [address], [place], [place] [address]   Urine Culture     Status: None   Collection Time: 06/13/21  3:57 PM   Specimen: In/Out Cath Urine  Result Value Ref Range Status   Specimen Description IN/OUT CATH URINE  Final   Special Requests NONE  Final   Culture   Final    NO GROWTH Performed at [facility], [place] [address], [place], [place] [address]    Report Status 06/14/2021 FINAL  Final  SARS CORONAVIRUS 2 (TAT 6-24 HRS) Nasopharyngeal Nasopharyngeal Swab     Status: None   Collection Time: 06/13/21  5:10 PM   Specimen: Nasopharyngeal Swab  Result Value Ref Range Status   SARS Coronavirus 2 NEGATIVE NEGATIVE Final    Comment:  (NOTE) SARS-CoV-2 target nucleic acids are NOT DETECTED.  The SARS-CoV-2 RNA is generally detectable in upper and lower respiratory specimens during the acute phase of infection. Negative results do not preclude SARS-CoV-2 infection, do not rule out co-infections with other pathogens, and should not be used as the sole basis for treatment or other patient management decisions. Negative results must be combined with clinical observations, patient history, and epidemiological information. The expected result is Negative.  Fact Sheet for Patients: SugarRoll.be  Fact Sheet for Healthcare Providers: https://www.woods-mathews.com/  This test is not yet approved or cleared by the [place] FDA and  has been authorized for detection and/or diagnosis of SARS-CoV-2 by FDA under an Emergency Use Authorization (EUA). This EUA will remain  in effect (meaning this test can be used) for the duration of the COVID-19 declaration under Se ction 564(b)(1) of the Act, 21 U.S.C. section 360bbb-3(b)(1), unless the authorization is terminated or revoked sooner.  Performed at [facility], [place] [address], [place], [place] [address]      Time coordinating discharge:  32 minutes  SIGNED:   Barb Merino, MD  [facility] 06/16/2021,  1:21 PM

## 2021-06-17 NOTE — Progress Notes (Signed)
Patient stable, IV line out. PTAR came to move patient to [facility] place.  Informed destination and patient's Nephew over the phone. Discharge papers handed to PTAR.

## 2021-06-30 DEATH — deceased

## 2021-07-27 ENCOUNTER — Ambulatory Visit: Payer: Medicare Other | Admitting: Internal Medicine

## 2021-07-27 NOTE — Progress Notes (Deleted)
Cardiology Office Note   Date:  07/27/2021   ID:  Heather Saunders, DOB 05/19/1925, MRN [id number]  PCP:  Kathyrn Lass, MD  Cardiologist:   Dorris Carnes, MD   Pt presents for f/u of dyspnea   History of Present Illness: Heather Saunders is a 87 y.o. female with a history of dyspnea.  Was followed in past  by Dr Tamala Julian  Hx of NSTEMI in 2006  Cath showed nonobstructive CAD  She also has a  Hx of diastolic dysfunction, dyspnea and HTN   Myovue done which was normal    I saw the pt in Jan 2021   She complained of some SOB with dancing   I recomm she  increase walking   Since seen she says she is doing OK  Breathing is OK    She is not as active has she has been given pandemic Pt has had a few dizzy spells but no syncope    From a noncardiac standpoint she complains of constipation and N/ over the past few wks   Weight OK   Tried an herbal tea which helped some    In the past 2 day it has gotten some better Never had colonoscopy   Has noted a few flecks of blood in stool      Outpatient Medications Prior to Visit  Medication Sig Dispense Refill   acetaminophen (TYLENOL) 500 MG tablet Take 500 mg by mouth every 6 (six) hours as needed for mild pain.     hydrALAZINE (APRESOLINE) 25 MG tablet Take 3 tablets (75 mg total) by mouth every 8 (eight) hours. 270 tablet 0   No facility-administered medications prior to visit.     Allergies:   Boniva [ibandronic acid] and Fosamax [alendronate sodium]   Past Medical History:  Diagnosis Date   Chronic kidney disease    Coronary disease    NonObstructive   History of nuclear stress test    Nuclear stress test 10/18: EF 96, normal perfusion, low risk   Hyperlipidemia    Hypertension    Myocardial infarct [facility]) 2006   Osteopenia    Osteoporosis     Past Surgical History:  Procedure Laterality Date   LIPOMA EXCISION     removed from Shoulder   TEE WITHOUT CARDIOVERSION N/A 03/20/2021   Procedure: TRANSESOPHAGEAL ECHOCARDIOGRAM (TEE);   Surgeon: Donato Heinz, MD;  Location: [facility] ENDOSCOPY;  Service: Cardiovascular;  Laterality: N/A;     Social History:  The patient  reports that she has never smoked. She has never used smokeless tobacco. She reports current alcohol use of about 2.0 standard drinks per week. She reports that she does not use drugs.   Family History:  The patient's family history includes Cancer (age of onset: 52) in her mother.    ROS:  Please see the history of present illness. All other systems are reviewed and  Negative to the above problem except as noted.    PHYSICAL EXAM: VS:  There were no vitals taken for this visit.  GEN: Thin 87 yo  in no acute distress  Neck: No JVP  No bruits   Cardiac: RRR; no murmurs No LE edema  Respiratory:  clear to auscultation bilaterally GI: soft, nontender, nondistended, + BS  No hepatomegaly  MS: no deformity Moving all extremities   Skin: warm and dry, no rash Neuro:  Strength and sensation are intact Psych: euthymic mood, full affect   EKG:  EKG is not ordered today.  Lipid Panel    Component Value Date/Time   CHOL 153 06/01/2021 0330   CHOL 163 06/06/2020 1035   TRIG 103 06/01/2021 0330   HDL 44 06/01/2021 0330   HDL 47 06/06/2020 1035   CHOLHDL 3.5 06/01/2021 0330   VLDL 21 06/01/2021 0330   LDLCALC 88 06/01/2021 0330   LDLCALC 89 06/06/2020 1035      Wt Readings from Last 3 Encounters:  06/14/21 111 lb 8.8 oz (50.6 kg)  05/02/21 109 lb (49.4 kg)  03/30/21 118 lb 9.7 oz (53.8 kg)      ASSESSMENT AND PLAN:  1  Dyspnea  Normal myovue in past   PT wthout complaints today  Volume good on exam  2.  HTN   BP is a little high  She says it is usually lower   Given age I would not push lower   3  GI  WIll get labs   If continues follow with Dr Sabra Heck    Pt needs hemoccult cards  Should get from Dr Sabra Heck  4  HL  Cointinue Zetia and lipitor  4  CAD  NSTEMI 2006   Nonobstructive CAD at that time per report     F/U in June 2022      Signed, Dorris Carnes, MD  07/27/2021 1:04 PM    [place], [place],   [address] Phone: [phone]; Fax: [phone]

## 2021-12-05 IMAGING — CT CT HEAD W/O CM
4 series · 17 of 47 positions shown, 19 images · non-contrast
Comparison: None.

CLINICAL DATA: Altered mental status.

EXAM:
CT HEAD WITHOUT CONTRAST
TECHNIQUE: Contiguous axial images were obtained from the base of the skull
through the vertex without intravenous contrast.

[Series 3: head wo · axial · 0.42mm/px · z∈[+1331,+1451]mm · 7 of 34 slices shown, 9 images]
[im 5/34  brain]
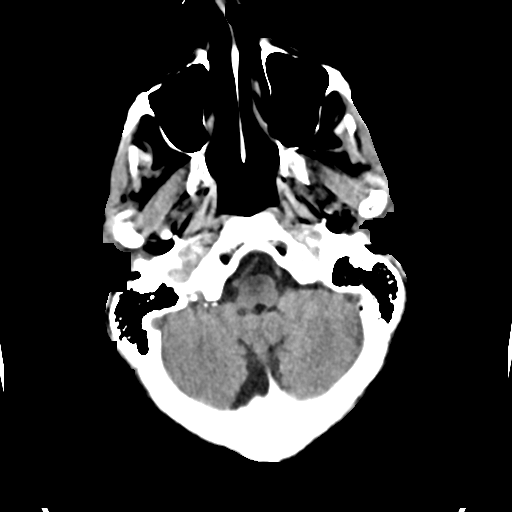
[im 5/34  bone]
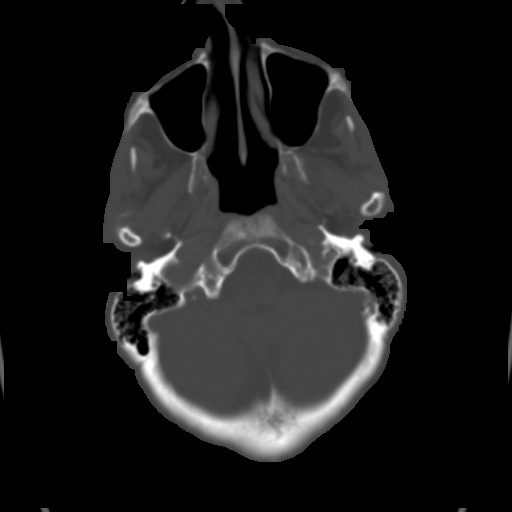
[im 9/34  brain]
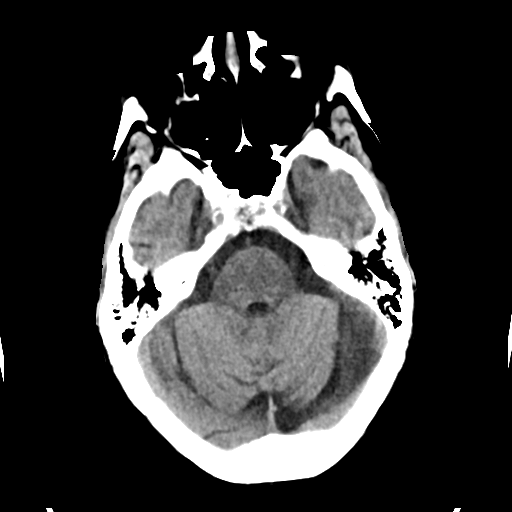
[im 13/34  brain]
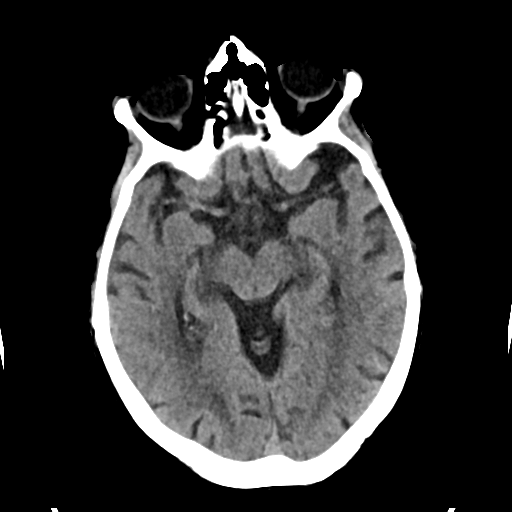
[im 17/34  brain]
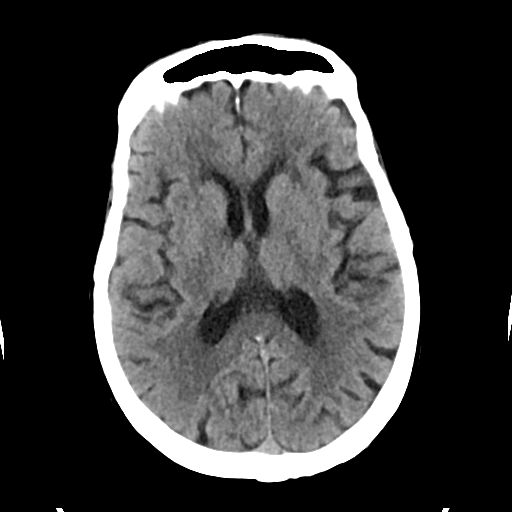
[im 21/34  brain]
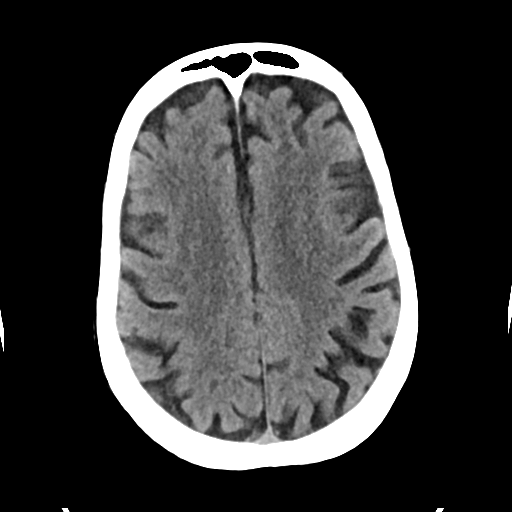
[im 21/34  bone]
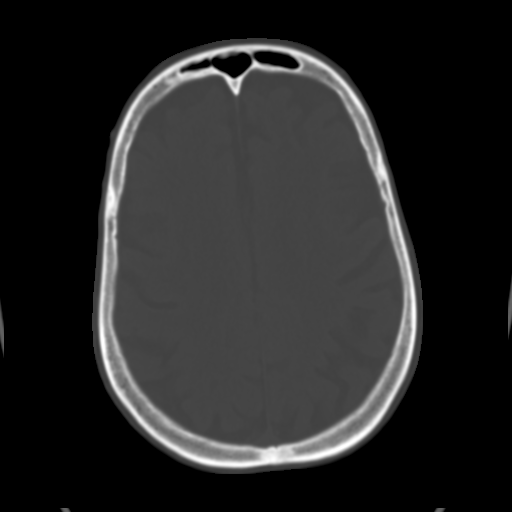
[im 25/34  brain]
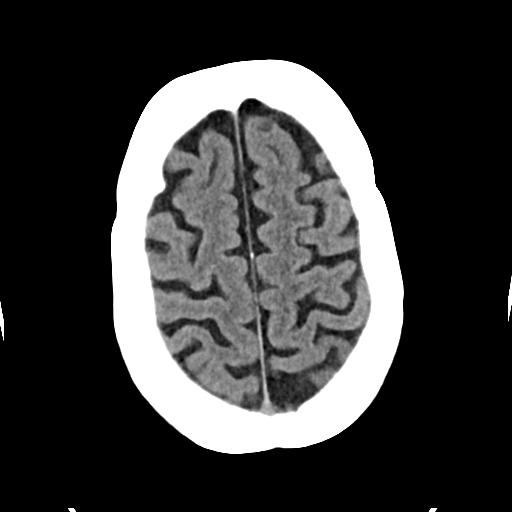
[im 29/34  brain]
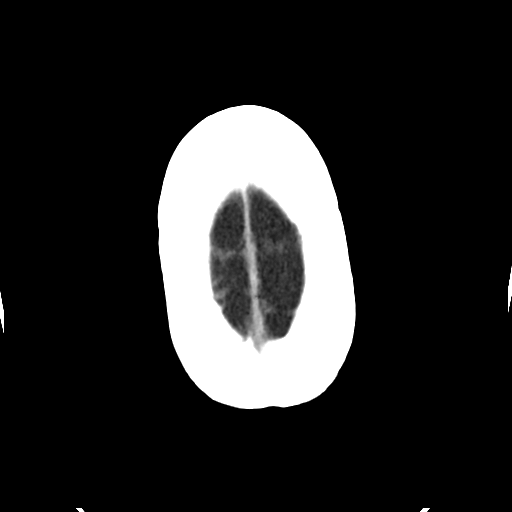

[Series 4: head bone · axial · 0.42mm/px · z∈[+1327,+1385]mm · 4 of 84 slices shown]
[im 9/84  bone]
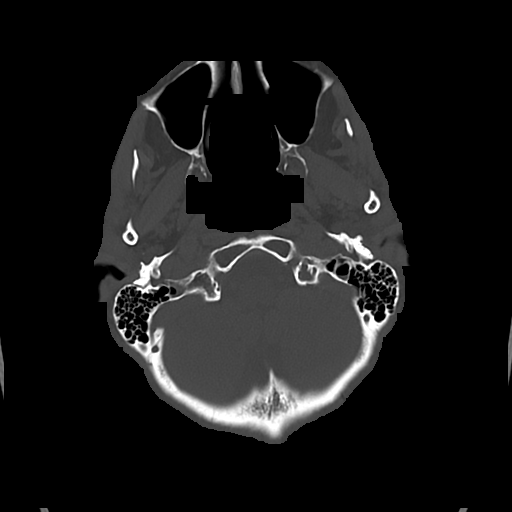
[im 17/84  bone]
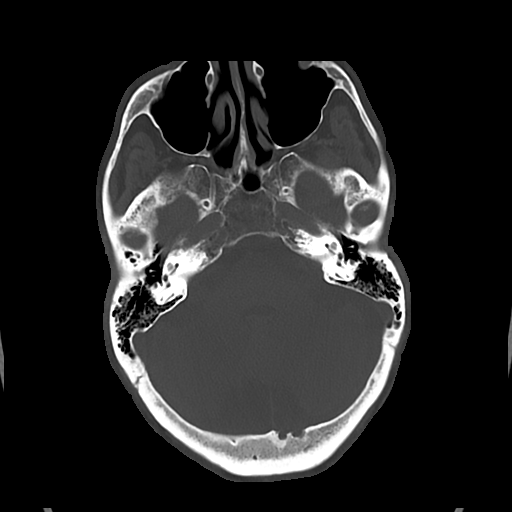
[im 25/84  bone]
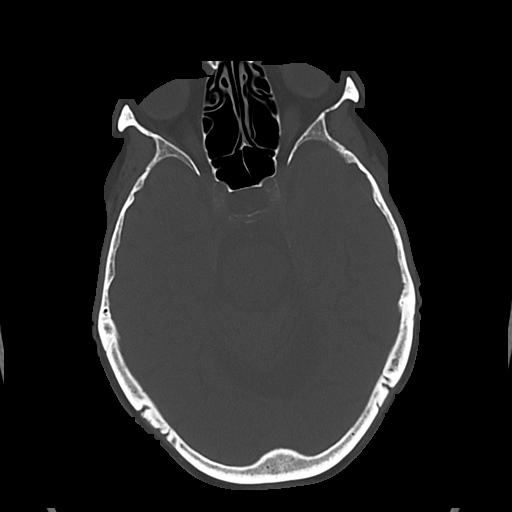
[im 38/84  bone]
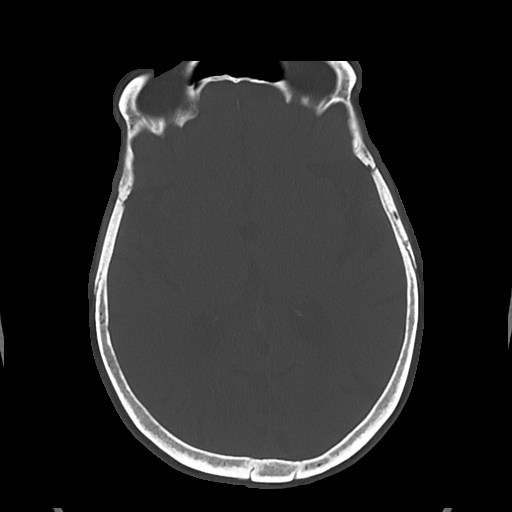

[Series 5: cor soft · coronal · 0.32mm/px · 3 of 68 slices shown]
[im 23/68  brain]
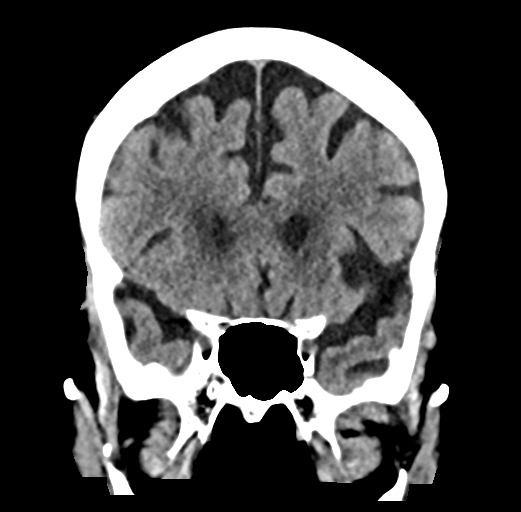
[im 30/68  brain]
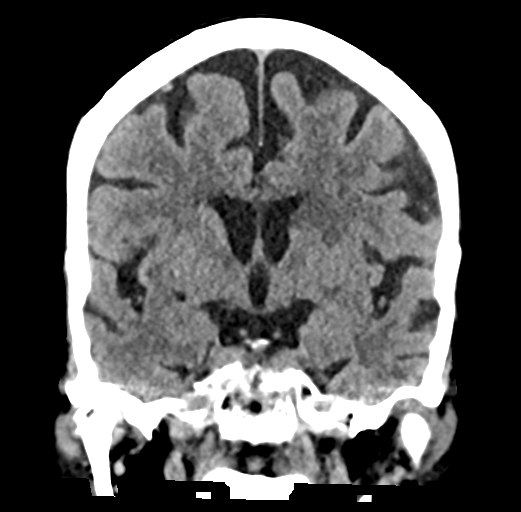
[im 38/68  brain]
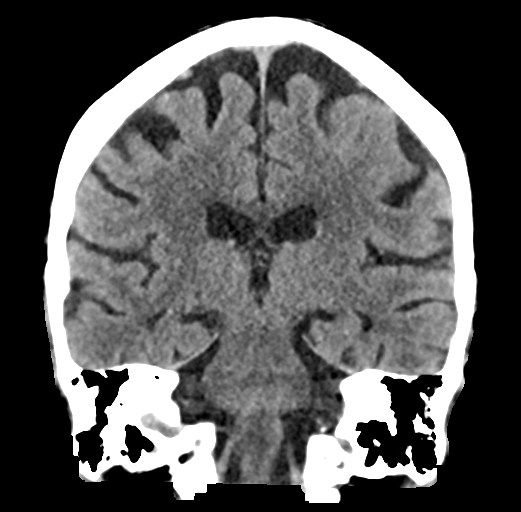

[Series 6: sag soft · sagittal · 0.32mm/px · 3 of 52 slices shown]
[im 18/52  brain]
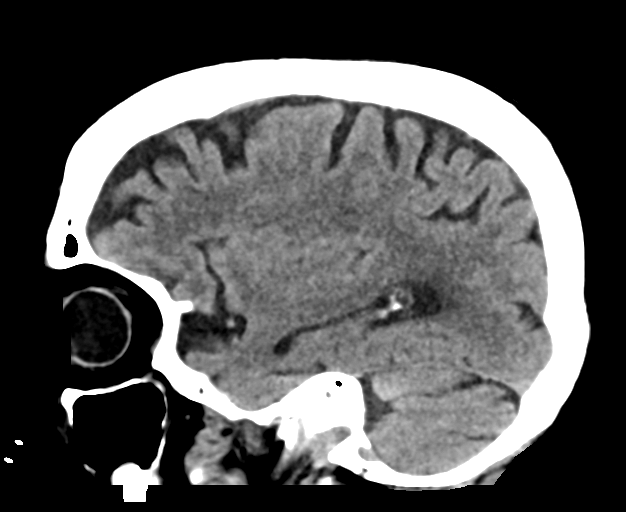
[im 26/52  brain]
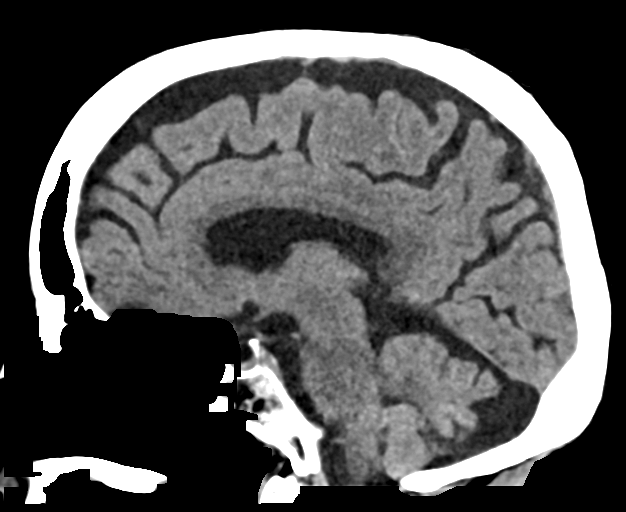
[im 35/52  brain]
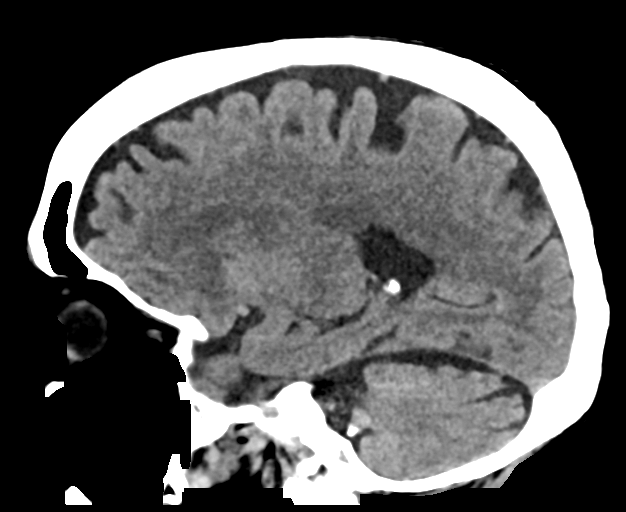

[17 of 47 positions shown; findings below may reference images not displayed]

FINDINGS: Brain: Mild age-related atrophy and chronic microvascular ischemic
changes. There is no acute intracranial hemorrhage. No mass effect
midline shift. No extra-axial fluid collection.

Vascular: No hyperdense vessel or unexpected calcification.

Skull: Normal. Negative for fracture or focal lesion.

Sinuses/Orbits: No acute finding.

Other: None
IMPRESSION: 1. No acute intracranial pathology.
2. Mild age-related atrophy and chronic microvascular ischemic
changes.
